# Patient Record
Sex: Female | Born: 1955
Health system: Southern US, Community
[De-identification: ages and names within clinical notes are randomized; demographics above are authoritative.]

## PROBLEM LIST (undated history)

## (undated) DIAGNOSIS — R590 Localized enlarged lymph nodes: Secondary | ICD-10-CM

## (undated) DIAGNOSIS — D693 Immune thrombocytopenic purpura: Secondary | ICD-10-CM

## (undated) DIAGNOSIS — R911 Solitary pulmonary nodule: Secondary | ICD-10-CM

## (undated) DIAGNOSIS — N189 Chronic kidney disease, unspecified: Secondary | ICD-10-CM

## (undated) DIAGNOSIS — G5600 Carpal tunnel syndrome, unspecified upper limb: Secondary | ICD-10-CM

## (undated) DIAGNOSIS — I10 Essential (primary) hypertension: Secondary | ICD-10-CM

## (undated) DIAGNOSIS — E039 Hypothyroidism, unspecified: Secondary | ICD-10-CM

## (undated) DIAGNOSIS — I219 Acute myocardial infarction, unspecified: Secondary | ICD-10-CM

## (undated) DIAGNOSIS — I639 Cerebral infarction, unspecified: Secondary | ICD-10-CM

## (undated) DIAGNOSIS — D47Z2 Castleman disease: Secondary | ICD-10-CM

## (undated) HISTORY — DX: Castleman disease: D47.Z2

## (undated) HISTORY — DX: Carpal tunnel syndrome, unspecified upper limb: G56.00

## (undated) HISTORY — DX: Acute myocardial infarction, unspecified: I21.9

## (undated) HISTORY — PX: APPENDECTOMY: SHX54

## (undated) HISTORY — DX: Solitary pulmonary nodule: R91.1

## (undated) HISTORY — DX: Cerebral infarction, unspecified: I63.9

## (undated) HISTORY — DX: Hypothyroidism, unspecified: E03.9

## (undated) HISTORY — DX: Chronic kidney disease, unspecified: N18.9

## (undated) HISTORY — DX: Localized enlarged lymph nodes: R59.0

## (undated) HISTORY — DX: Immune thrombocytopenic purpura: D69.3

---

## 1999-04-21 ENCOUNTER — Other Ambulatory Visit: Admission: RE | Admit: 1999-04-21 | Discharge: 1999-04-21 | Payer: Self-pay | Admitting: Obstetrics and Gynecology

## 2002-10-31 ENCOUNTER — Encounter: Payer: Self-pay | Admitting: Obstetrics and Gynecology

## 2002-10-31 ENCOUNTER — Encounter: Admission: RE | Admit: 2002-10-31 | Discharge: 2002-10-31 | Payer: Self-pay | Admitting: Obstetrics and Gynecology

## 2003-12-18 ENCOUNTER — Encounter: Admission: RE | Admit: 2003-12-18 | Discharge: 2003-12-18 | Payer: Self-pay | Admitting: Obstetrics and Gynecology

## 2004-11-15 ENCOUNTER — Inpatient Hospital Stay (HOSPITAL_COMMUNITY): Admission: AD | Admit: 2004-11-15 | Discharge: 2004-11-30 | Payer: Self-pay | Admitting: Internal Medicine

## 2004-11-16 ENCOUNTER — Encounter (INDEPENDENT_AMBULATORY_CARE_PROVIDER_SITE_OTHER): Payer: Self-pay | Admitting: *Deleted

## 2004-11-17 ENCOUNTER — Ambulatory Visit: Payer: Self-pay | Admitting: Internal Medicine

## 2004-11-17 ENCOUNTER — Encounter (INDEPENDENT_AMBULATORY_CARE_PROVIDER_SITE_OTHER): Payer: Self-pay | Admitting: *Deleted

## 2004-11-18 ENCOUNTER — Encounter (INDEPENDENT_AMBULATORY_CARE_PROVIDER_SITE_OTHER): Payer: Self-pay | Admitting: Cardiology

## 2004-11-19 ENCOUNTER — Encounter (INDEPENDENT_AMBULATORY_CARE_PROVIDER_SITE_OTHER): Payer: Self-pay | Admitting: *Deleted

## 2004-11-30 ENCOUNTER — Ambulatory Visit: Payer: Self-pay | Admitting: Internal Medicine

## 2005-01-26 ENCOUNTER — Ambulatory Visit: Payer: Self-pay | Admitting: Internal Medicine

## 2005-03-23 ENCOUNTER — Ambulatory Visit: Payer: Self-pay | Admitting: Internal Medicine

## 2005-06-21 ENCOUNTER — Ambulatory Visit: Payer: Self-pay | Admitting: Internal Medicine

## 2005-08-01 DIAGNOSIS — I219 Acute myocardial infarction, unspecified: Secondary | ICD-10-CM

## 2005-08-01 HISTORY — DX: Acute myocardial infarction, unspecified: I21.9

## 2005-09-13 ENCOUNTER — Inpatient Hospital Stay (HOSPITAL_COMMUNITY): Admission: EM | Admit: 2005-09-13 | Discharge: 2005-09-15 | Payer: Self-pay | Admitting: Emergency Medicine

## 2007-01-29 ENCOUNTER — Ambulatory Visit (HOSPITAL_COMMUNITY): Admission: RE | Admit: 2007-01-29 | Discharge: 2007-01-29 | Payer: Self-pay | Admitting: Cardiology

## 2010-08-01 DIAGNOSIS — R590 Localized enlarged lymph nodes: Secondary | ICD-10-CM

## 2010-08-01 HISTORY — DX: Localized enlarged lymph nodes: R59.0

## 2010-08-15 ENCOUNTER — Emergency Department (HOSPITAL_COMMUNITY)
Admission: EM | Admit: 2010-08-15 | Discharge: 2010-08-15 | Payer: Self-pay | Source: Home / Self Care | Admitting: Emergency Medicine

## 2010-08-16 LAB — DIFFERENTIAL
Basophils Absolute: 0.1 10*3/uL (ref 0.0–0.1)
Basophils Relative: 0 % (ref 0–1)
Eosinophils Absolute: 0.1 10*3/uL (ref 0.0–0.7)
Eosinophils Relative: 0 % (ref 0–5)
Lymphocytes Relative: 17 % (ref 12–46)
Lymphs Abs: 2.5 10*3/uL (ref 0.7–4.0)
Monocytes Absolute: 1 10*3/uL (ref 0.1–1.0)
Monocytes Relative: 7 % (ref 3–12)
Neutro Abs: 10.9 10*3/uL — ABNORMAL HIGH (ref 1.7–7.7)
Neutrophils Relative %: 76 % (ref 43–77)

## 2010-08-16 LAB — POCT I-STAT, CHEM 8
BUN: 9 mg/dL (ref 6–23)
Calcium, Ion: 1.12 mmol/L (ref 1.12–1.32)
Chloride: 105 mEq/L (ref 96–112)
Creatinine, Ser: 0.9 mg/dL (ref 0.4–1.2)
Glucose, Bld: 97 mg/dL (ref 70–99)
HCT: 51 % — ABNORMAL HIGH (ref 36.0–46.0)
Hemoglobin: 17.3 g/dL — ABNORMAL HIGH (ref 12.0–15.0)
Potassium: 3.9 mEq/L (ref 3.5–5.1)
Sodium: 140 mEq/L (ref 135–145)
TCO2: 25 mmol/L (ref 0–100)

## 2010-08-16 LAB — CBC
HCT: 46.1 % — ABNORMAL HIGH (ref 36.0–46.0)
Hemoglobin: 15.4 g/dL — ABNORMAL HIGH (ref 12.0–15.0)
MCH: 30.6 pg (ref 26.0–34.0)
MCHC: 33.4 g/dL (ref 30.0–36.0)
MCV: 91.7 fL (ref 78.0–100.0)
Platelets: 234 10*3/uL (ref 150–400)
RBC: 5.03 MIL/uL (ref 3.87–5.11)
RDW: 13.6 % (ref 11.5–15.5)
WBC: 14.4 10*3/uL — ABNORMAL HIGH (ref 4.0–10.5)

## 2010-08-22 ENCOUNTER — Encounter: Payer: Self-pay | Admitting: Emergency Medicine

## 2010-08-27 ENCOUNTER — Emergency Department (HOSPITAL_COMMUNITY)
Admission: EM | Admit: 2010-08-27 | Discharge: 2010-08-27 | Payer: Self-pay | Source: Home / Self Care | Admitting: Emergency Medicine

## 2010-08-27 LAB — COMPREHENSIVE METABOLIC PANEL
ALT: 17 U/L (ref 0–35)
AST: 21 U/L (ref 0–37)
Albumin: 4 g/dL (ref 3.5–5.2)
Alkaline Phosphatase: 76 U/L (ref 39–117)
BUN: 2 mg/dL — ABNORMAL LOW (ref 6–23)
CO2: 25 mEq/L (ref 19–32)
Calcium: 9.7 mg/dL (ref 8.4–10.5)
Chloride: 104 mEq/L (ref 96–112)
Creatinine, Ser: 0.81 mg/dL (ref 0.4–1.2)
GFR calc Af Amer: >60 mL/min (ref >60)
GFR calc non Af Amer: >60 mL/min (ref >60)
Glucose, Bld: 123 mg/dL — ABNORMAL HIGH (ref 70–99)
Potassium: 3.6 mEq/L (ref 3.5–5.1)
Sodium: 139 mEq/L (ref 135–145)
Total Bilirubin: 0.6 mg/dL (ref 0.3–1.2)
Total Protein: 6.6 g/dL (ref 6.0–8.3)

## 2010-08-27 LAB — CBC
HCT: 42.7 % (ref 36.0–46.0)
Hemoglobin: 14 g/dL (ref 12.0–15.0)
MCH: 29.8 pg (ref 26.0–34.0)
MCHC: 32.8 g/dL (ref 30.0–36.0)
MCV: 90.9 fL (ref 78.0–100.0)
Platelets: 191 10*3/uL (ref 150–400)
RBC: 4.7 MIL/uL (ref 3.87–5.11)
RDW: 13.4 % (ref 11.5–15.5)
WBC: 10.4 10*3/uL (ref 4.0–10.5)

## 2010-08-27 LAB — URINALYSIS, ROUTINE W REFLEX MICROSCOPIC
Bilirubin Urine: NEGATIVE
Hgb urine dipstick: NEGATIVE
Ketones, ur: NEGATIVE mg/dL
Nitrite: NEGATIVE
Protein, ur: NEGATIVE mg/dL
Specific Gravity, Urine: 1.008 (ref 1.005–1.030)
Urine Glucose, Fasting: NEGATIVE mg/dL
Urobilinogen, UA: 0.2 mg/dL (ref 0.0–1.0)
pH: 7.5 (ref 5.0–8.0)

## 2010-08-27 LAB — DIFFERENTIAL
Basophils Absolute: 0 10*3/uL (ref 0.0–0.1)
Basophils Relative: 0 % (ref 0–1)
Eosinophils Absolute: 0 10*3/uL (ref 0.0–0.7)
Eosinophils Relative: 0 % (ref 0–5)
Lymphocytes Relative: 17 % (ref 12–46)
Lymphs Abs: 1.7 10*3/uL (ref 0.7–4.0)
Monocytes Absolute: 0.7 10*3/uL (ref 0.1–1.0)
Monocytes Relative: 6 % (ref 3–12)
Neutro Abs: 8 10*3/uL — ABNORMAL HIGH (ref 1.7–7.7)
Neutrophils Relative %: 77 % (ref 43–77)

## 2010-08-27 LAB — LIPASE, BLOOD: Lipase: 16 U/L (ref 11–59)

## 2010-08-31 ENCOUNTER — Emergency Department (HOSPITAL_COMMUNITY)
Admission: EM | Admit: 2010-08-31 | Discharge: 2010-08-31 | Payer: Self-pay | Source: Home / Self Care | Admitting: Emergency Medicine

## 2010-08-31 LAB — OCCULT BLOOD, POC DEVICE: Fecal Occult Bld: POSITIVE

## 2010-09-01 ENCOUNTER — Emergency Department (HOSPITAL_COMMUNITY): Payer: BC Managed Care – PPO

## 2010-09-01 ENCOUNTER — Inpatient Hospital Stay (HOSPITAL_COMMUNITY)
Admission: EM | Admit: 2010-09-01 | Discharge: 2010-09-06 | DRG: 170 | Disposition: A | Discharge status: Home / Self Care | Payer: BC Managed Care – PPO | Attending: Family Medicine | Admitting: Family Medicine

## 2010-09-01 DIAGNOSIS — E860 Dehydration: Secondary | ICD-10-CM | POA: Diagnosis present

## 2010-09-01 DIAGNOSIS — I252 Old myocardial infarction: Secondary | ICD-10-CM

## 2010-09-01 DIAGNOSIS — Z9861 Coronary angioplasty status: Secondary | ICD-10-CM

## 2010-09-01 DIAGNOSIS — E876 Hypokalemia: Secondary | ICD-10-CM | POA: Diagnosis present

## 2010-09-01 DIAGNOSIS — E039 Hypothyroidism, unspecified: Secondary | ICD-10-CM | POA: Diagnosis present

## 2010-09-01 DIAGNOSIS — I10 Essential (primary) hypertension: Secondary | ICD-10-CM | POA: Diagnosis present

## 2010-09-01 DIAGNOSIS — E2749 Other adrenocortical insufficiency: Secondary | ICD-10-CM | POA: Diagnosis present

## 2010-09-01 DIAGNOSIS — K5900 Constipation, unspecified: Secondary | ICD-10-CM | POA: Diagnosis present

## 2010-09-01 DIAGNOSIS — I2510 Atherosclerotic heart disease of native coronary artery without angina pectoris: Secondary | ICD-10-CM | POA: Diagnosis present

## 2010-09-01 DIAGNOSIS — F17200 Nicotine dependence, unspecified, uncomplicated: Secondary | ICD-10-CM | POA: Diagnosis present

## 2010-09-01 DIAGNOSIS — R1031 Right lower quadrant pain: Principal | ICD-10-CM | POA: Diagnosis present

## 2010-09-01 DIAGNOSIS — R599 Enlarged lymph nodes, unspecified: Secondary | ICD-10-CM | POA: Diagnosis present

## 2010-09-01 LAB — COMPREHENSIVE METABOLIC PANEL
ALT: 18 U/L (ref 0–35)
AST: 15 U/L (ref 0–37)
Albumin: 4.1 g/dL (ref 3.5–5.2)
Alkaline Phosphatase: 87 U/L (ref 39–117)
BUN: 2 mg/dL — ABNORMAL LOW (ref 6–23)
Chloride: 101 mEq/L (ref 96–112)
Potassium: 3.6 mEq/L (ref 3.5–5.1)
Sodium: 140 mEq/L (ref 135–145)
Total Bilirubin: 0.5 mg/dL (ref 0.3–1.2)

## 2010-09-01 LAB — CBC
HCT: 41.2 % (ref 36.0–46.0)
Hemoglobin: 13.7 g/dL (ref 12.0–15.0)
RBC: 4.53 MIL/uL (ref 3.87–5.11)
RDW: 13.4 % (ref 11.5–15.5)
WBC: 7.5 10*3/uL (ref 4.0–10.5)

## 2010-09-01 LAB — DIFFERENTIAL
Basophils Absolute: 0 10*3/uL (ref 0.0–0.1)
Lymphocytes Relative: 25 % (ref 12–46)
Lymphs Abs: 1.9 10*3/uL (ref 0.7–4.0)
Neutro Abs: 4.8 10*3/uL (ref 1.7–7.7)
Neutrophils Relative %: 64 % (ref 43–77)

## 2010-09-01 LAB — URINALYSIS, ROUTINE W REFLEX MICROSCOPIC
Nitrite: NEGATIVE
Urine Glucose, Fasting: NEGATIVE mg/dL
pH: 6.5 (ref 5.0–8.0)

## 2010-09-01 LAB — D-DIMER, QUANTITATIVE: D-Dimer, Quant: 0.66 ug/mL-FEU — ABNORMAL HIGH (ref 0.00–0.48)

## 2010-09-01 LAB — POCT CARDIAC MARKERS

## 2010-09-01 LAB — CK TOTAL AND CKMB (NOT AT ARMC)
CK, MB: 0.6 ng/mL (ref 0.3–4.0)
Total CK: 19 U/L (ref 7–177)

## 2010-09-01 LAB — TROPONIN I: Troponin I: <0.01 ng/mL (ref 0.00–0.06)

## 2010-09-01 LAB — LIPASE, BLOOD: Lipase: 19 U/L (ref 11–59)

## 2010-09-01 MED ORDER — IOHEXOL 300 MG/ML  SOLN
100.0000 mL | Freq: Once | INTRAMUSCULAR | Status: AC | PRN
  Administered 2010-09-01: 70 mL via INTRAVENOUS
  Filled 2010-09-01: qty 100

## 2010-09-02 ENCOUNTER — Inpatient Hospital Stay (HOSPITAL_COMMUNITY): Payer: BC Managed Care – PPO

## 2010-09-02 LAB — GLUCOSE, CAPILLARY: Glucose-Capillary: 108 mg/dL — ABNORMAL HIGH (ref 70–99)

## 2010-09-02 LAB — CARDIAC PANEL(CRET KIN+CKTOT+MB+TROPI)
Relative Index: INVALID (ref 0.0–2.5)
Troponin I: 0.01 ng/mL (ref 0.00–0.06)

## 2010-09-02 LAB — CORTISOL: Cortisol, Plasma: 16.3 ug/dL

## 2010-09-02 LAB — HEMOGLOBIN A1C: Hgb A1c MFr Bld: 5.9 % — ABNORMAL HIGH (ref <5.7)

## 2010-09-02 LAB — PHOSPHORUS: Phosphorus: 4.4 mg/dL (ref 2.3–4.6)

## 2010-09-02 LAB — LACTIC ACID, PLASMA: Lactic Acid, Venous: 1 mmol/L (ref 0.5–2.2)

## 2010-09-02 LAB — PROTIME-INR: Prothrombin Time: 13.7 seconds (ref 11.6–15.2)

## 2010-09-02 LAB — LIPID PANEL
Cholesterol: 122 mg/dL (ref 0–200)
Total CHOL/HDL Ratio: 6.1 RATIO

## 2010-09-02 MED ORDER — GADOBENATE DIMEGLUMINE 529 MG/ML IV SOLN
0.1000 mmol/kg | Freq: Once | INTRAVENOUS | Status: AC | PRN
  Administered 2010-09-02: 14 mL via INTRAVENOUS

## 2010-09-03 ENCOUNTER — Inpatient Hospital Stay (HOSPITAL_COMMUNITY): Payer: BC Managed Care – PPO

## 2010-09-03 ENCOUNTER — Other Ambulatory Visit: Payer: Self-pay | Admitting: Interventional Radiology

## 2010-09-03 LAB — COMPREHENSIVE METABOLIC PANEL WITH GFR
ALT: 13 U/L (ref 0–35)
AST: 15 U/L (ref 0–37)
Albumin: 3.2 g/dL — ABNORMAL LOW (ref 3.5–5.2)
Alkaline Phosphatase: 75 U/L (ref 39–117)
BUN: <1 mg/dL — ABNORMAL LOW (ref 6–23)
CO2: 26 meq/L (ref 19–32)
Calcium: 8.5 mg/dL (ref 8.4–10.5)
Chloride: 106 meq/L (ref 96–112)
Creatinine, Ser: 0.65 mg/dL (ref 0.4–1.2)
GFR calc non Af Amer: >60 mL/min
Glucose, Bld: 96 mg/dL (ref 70–99)
Potassium: 3.4 meq/L — ABNORMAL LOW (ref 3.5–5.1)
Sodium: 141 meq/L (ref 135–145)
Total Bilirubin: 0.4 mg/dL (ref 0.3–1.2)
Total Protein: 6 g/dL (ref 6.0–8.3)

## 2010-09-03 LAB — ACTH: C206 ACTH: 34 pg/mL (ref 10–46)

## 2010-09-03 LAB — GLUCOSE, CAPILLARY: Glucose-Capillary: 98 mg/dL (ref 70–99)

## 2010-09-03 LAB — CBC
MCH: 29.9 pg (ref 26.0–34.0)
Platelets: 189 10*3/uL (ref 150–400)
RBC: 4.38 MIL/uL (ref 3.87–5.11)

## 2010-09-03 LAB — LACTATE DEHYDROGENASE: LDH: 141 U/L (ref 94–250)

## 2010-09-04 ENCOUNTER — Inpatient Hospital Stay (HOSPITAL_COMMUNITY): Payer: BC Managed Care – PPO

## 2010-09-04 DIAGNOSIS — R599 Enlarged lymph nodes, unspecified: Secondary | ICD-10-CM

## 2010-09-04 LAB — BASIC METABOLIC PANEL
BUN: 3 mg/dL — ABNORMAL LOW (ref 6–23)
Chloride: 105 mEq/L (ref 96–112)
Glucose, Bld: 96 mg/dL (ref 70–99)
Potassium: 4.2 mEq/L (ref 3.5–5.1)

## 2010-09-04 LAB — GLUCOSE, CAPILLARY: Glucose-Capillary: 101 mg/dL — ABNORMAL HIGH (ref 70–99)

## 2010-09-05 LAB — BASIC METABOLIC PANEL
CO2: 26 mEq/L (ref 19–32)
GFR calc Af Amer: >60 mL/min (ref >60)
GFR calc non Af Amer: >60 mL/min (ref >60)
Glucose, Bld: 93 mg/dL (ref 70–99)
Potassium: 3.7 mEq/L (ref 3.5–5.1)
Sodium: 140 mEq/L (ref 135–145)

## 2010-09-05 LAB — CBC
HCT: 39.3 % (ref 36.0–46.0)
Hemoglobin: 12.6 g/dL (ref 12.0–15.0)
MCHC: 32.1 g/dL (ref 30.0–36.0)
RBC: 4.3 MIL/uL (ref 3.87–5.11)

## 2010-09-05 LAB — GLUCOSE, CAPILLARY

## 2010-09-06 LAB — CRYPTOCOCCAL ANTIGEN

## 2010-09-06 LAB — HIGH SENSITIVITY CRP: CRP, High Sensitivity: 12 mg/L — ABNORMAL HIGH

## 2010-09-06 LAB — T3, FREE: T3, Free: 2.4 pg/mL (ref 2.3–4.2)

## 2010-09-06 LAB — ANTI-NUCLEAR AB-TITER (ANA TITER): ANA Titer 1: 1:80 {titer} — ABNORMAL HIGH

## 2010-09-06 LAB — ANA: Anti Nuclear Antibody(ANA): POSITIVE — AB

## 2010-09-07 LAB — PROTEIN ELECTROPHORESIS, SERUM
Alpha-1-Globulin: 6 % — ABNORMAL HIGH (ref 2.9–4.9)
Alpha-2-Globulin: 12.1 % — ABNORMAL HIGH (ref 7.1–11.8)
Beta 2: 4.3 % (ref 3.2–6.5)
Gamma Globulin: 18.1 % (ref 11.1–18.8)
M-Spike, %: NOT DETECTED g/dL

## 2010-09-07 LAB — UIFE/LIGHT CHAINS/TP QN, 24-HR UR
Albumin, U: DETECTED
Beta, Urine: DETECTED — AB
Free Lambda Excretion/Day: 3.26 mg/d
Free Lambda Lt Chains,Ur: 0.29 mg/dL (ref 0.08–1.01)
Free Lt Chn Excr Rate: 59.4 mg/d
Time: 24 hours
Total Protein, Urine-Ur/day: 69 mg/d (ref 10–140)
Total Protein, Urine: 6.1 mg/dL
Volume, Urine: 1125 mL

## 2010-09-07 LAB — TOXOPLASMA ANTIBODIES- IGG AND  IGM
Toxoplasma Antibody- IgM: 0.3 IV
Toxoplasma IgG Ratio: 0.7 IU/mL

## 2010-09-07 LAB — EPSTEIN-BARR VIRUS VCA ANTIBODY PANEL
EBV NA IgG: 2.93 {ISR} — ABNORMAL HIGH
EBV VCA IgM: 0.12 {ISR}

## 2010-09-07 LAB — ANGIOTENSIN CONVERTING ENZYME: Angiotensin-Converting Enzyme: 35 U/L (ref 8–52)

## 2010-09-08 LAB — CULTURE, BLOOD (ROUTINE X 2)
Culture  Setup Time: 201202021006
Culture: NO GROWTH

## 2010-09-08 LAB — BARTONELLA ANTIBODY PANEL
B henselae IgG: NEGATIVE
B henselae IgM: NEGATIVE

## 2010-09-08 LAB — HIV-1 RNA ULTRAQUANT REFLEX TO GENTYP+: HIV 1 RNA Quant: <20 copies/mL (ref <20)

## 2010-09-09 LAB — QUANTIFERON TB GOLD ASSAY (BLOOD): Mitogen Minus Nil Value: 9.45 IU/mL

## 2010-09-10 LAB — HISTOPLASMA ANTIGEN, URINE

## 2010-09-12 NOTE — H&P (Signed)
[name], [name]               ACCOUNT NO.:  [id number]  MEDICAL RECORD NO.:  [id number]           PATIENT TYPE:  E  LOCATION:  [facility]                         FACILITY:  [facility]  PHYSICIAN:  Rook Maue I Parish Dubose, MD      DATE OF BIRTH:  05/02/1956  DATE OF ADMISSION:  09/01/2010 DATE OF DISCHARGE:                             HISTORY & PHYSICAL   PRIMARY CARE PHYSICIAN/CARDIOLOGIST:  Eduardo Osier. Sharyn Lull, M.D.  ENDOCRINOLOGIST:  The patient recently has seen Dr. Talmage Nap.  CHIEF COMPLAINT:  Right upper abdominal pain for 1 week and constipation for 2 weeks.  HISTORY OF PRESENT ILLNESS:  This is a 55 year old Caucasian female with a history of hypothyroidism on replacement therapy, history of coronary artery disease status post right CA stent in 2007.  The patient's symptom started from the beginning of this year with some sinuses symptoms where the patient then received some antibiotic mainly Cefdinir and erythromycin where this complicated by diarrhea, which stopped about 2 weeks ago.  The patient then start to develop constipation.  She did not have any bowel movement for the last 2 weeks.  She has to come to the emergency room with abdominal pain last Friday, pain was 10/10, where the patient had CT of abdomen and pelvis, which did show abnormal appearance of her left adrenal gland with enlargement of the inferior aspect of the lymphatic gland and stranding in the renal fat and this raises  possibility of adrenal hemorrhage.  Appointment was made to see Dr. Talmage Nap, the endocrinologist.  Since Friday, the patient continued to have constant right hypochondrium pain associated with nausea, loss of appetite, but have 1 episode of vomiting.  The patient admitted.  The patient denies any hematemesis or bilious vomitus, but she has generally loss of appetite.  Pain was 10/10, sharp in nature.  Mild relief with current pain medication.  Accordingly, the patient has an ultrasound of her abdomen,  which did not show evidence of pericholecystic fluid or ascites, but noticed to have elevated D-dimer.  Accordingly, the patient had an CT of angio, which did not show evidence of pulmonary embolism, but this show some low neck, subpectoral and right axillary upper abdominal adenopathy, which worrisome for lymphoproliferative disorder. Also, this showed some bilateral adrenal haziness, new on the right from the CT scan done in August 27, 2010.  Adrenal hemorrhage is considered. The patient today seen by Dr. Talmage Nap for evaluation of her abdominal pain, which was thought may be related to her adrenal hemorrhage.  Dr. Talmage Nap did draw some blood test from the patient and asked the patient to come to the emergency room if pain recur.  Per the patient, the patient continued to have right hypochondrium, pain 10/10 associated with nausea and loss of appetite.  The patient denies any fever.  Condition also associated with constipation for the last 2 weeks.  PAST MEDICAL HISTORY: 1. Significant for hypothyroidism. 2. ITP. 3. MI status post RCA stent. 4. History of hypertension. 5. History of bilateral effusion and anasarca in 2007.  ALLERGIES:  No known drug allergies.  PAST SURGICAL HISTORY: 1. Significant for appendectomy. 2.  Two C-sections. 3. Status post RCA stent.  SOCIAL HISTORY:  She is married, have 2 grown children.  The patient admits smoking cigarette 1 pack per day for more than 20 years.  The patient denies any heavy drinking, but she drinks occasionally.  Denies any illicit drug abuse.  SYSTEMIC REVIEW:  The patient complain of some left side headache, but the patient denies any blurring of vision, complain of vertigo in the last 1 week, which is resolved currently.  Denies any nasal dripping. Denies any sore throat.  The patient admits she has loss of appetite. Denies any numbness or weakness of her extremities.  Respiratory:  The patient denies any coughing or fever.   Denies any palpitation, but sometimes she says shortness of breath.  Cardiovascular:  Denies any chest pain.  Denies any palpitation.  Denies any lower extremity swelling.  Abdomen:  The patient still with right hypochondrial pain 10/10, nonradiating.  Denies any hematemesis, complain of nausea, but 1 episode of vomiting.  Denies any significant loss of weight.  Denies any change in her menstrual cycle.  Denies any back pain.  Denies any lower extremity swelling.  Denies any burning micturition.  PHYSICAL EXAMINATION:  VITAL SIGNS:  On examination, the patient's vital signs, temperature 97.3, blood pressure 149/85, pulse rate 52, respiratory rate 22, saturating 98% on room air. HEENT:  The patient not in respiratory distress or shortness of breath. Pupils equal and reactive to light and accommodation.  Extraocular muscle movement was normal.  Mouth seems dry.  No tonsillar hypertrophy. There is a small lymph node and there is right axillary lymph node about 1 x 2 cm.  There is no discharge and nontender. BREAST EXAMINATION:  Symmetric.  I did not see any masses or nipple inverted.  I did not see any nipple discharge.  No masses. HEART:  S1 and S2 without added sounds. LUNG EXAMINATION:  Normal vesicular breathing with equal air entry. ABDOMEN:  Soft and there is some tenderness at the right hypochondrium. Murphy sign negative.  No rebound.  No guarding.  Bowel sound is positive. EXTREMITIES:  Lower extremity without edema.  Peripheral pulses intact. There is no area of point tenderness, although there could be a costophrenic angle tenderness.  LABORATORY DATA:  On blood workup, CBC, white blood cell 7.5, hemoglobin is 13.7, hematocrit 41.2, platelet 192.  Sodium 140, potassium 3.6, chloride 101, CO2 of 25, glucose 122, BUN 2, and creatinine 0.87, total bilirubin 0.5, alkaline phosphatase 87, AST 15, ALT 18, total protein 7.5, albumin 4.1, calcium 9.4, D-dimer was 0.66.  Cardiac  markers x2 was negative.  Lipase was negative.  The patient have some occult blood, which was positive on January.  The patient denies any colonoscopy in the past.  She had a CT of head on January 2012, which was negative.  CT of abdomen and pelvis on August 27, 2010, this show abnormal appearance of left adrenal gland with enlargement of the inferior aspect of the liver, adrenal gland has stranding in the renal fat.  Differential include atypical appearance of adrenal hemorrhage.  __________is possible but given the lack of adjacent atherosclerotic vascular disease, this is felt to be less likely increasing retroperitoneal lymphadenopathy as detailed above.  Chest x-ray, no acute finding. Ultrasound of the abdomen and pelvis, no evidence of stone or pericholecystic fluid.  CT angiogram negative for pulmonary embolism, low neck, and subpectoral, right axillary and upper abdominal adenopathy are highly suspicious for lymphoproliferative disorder, bilateral adrenal haziness on the  right from August 27, 2010, adrenal hemorrhage is considered.  ASSESSMENT AND PLAN: 1. Abdominal pain, which carry differential diagnosis, which include     constipation, also there is some bilateral adrenal haziness new on     the right from previous CT scan.  Differential bilateral     adrenal hemorrhage.  Could be related to just lymphoproliferative     disorder.  Our plan, we will admit the patient to Telemetry.  The     patient's EKG does show some minimal ST depression, but the patient     denies any chest pain or shortness of breath.  We will admit the     patient to Tele and we will proceed with MRI of the abdomen and     pelvis looking more closer to those adrenal gland.  We will get     random cortisol level and ACTH level, lactic acid level. 2. We will discuss the case with Dr. Talmage Nap.  Also, we will ask IR to     evaluate if there is any possibility of biopsy of the current lymph     node.  We will  get SPEP and UPEP.  We will get also Epstein-Barr     virus PCR.  The patient will be treated supportively with pain     medications and IV fluid.  Also will start the patient on clear     liquid diet as tolerated. 3. Hypokalemia.  We will replace with potassium supplement. 4. Dehydration.  IV fluid. 5. Bilateral haziness of both adrenal glands suspicious for     hemorrhage.  We will get random cortisol level and MRI of the     adrenal gland. 6. Hypothyroidism.  We will continue with the patient's medication     Synthroid.  The patient has already had a CT head of her brain,     which was negative, we will hold on any imaging.  Regarding the     patient's headache, we may need to proceed with MRI of the brain     and further recommendation to be addressed as the course progress.     Querida Beretta Bosie Helper, MD     HIE/MEDQ  D:  09/01/2010  T:  09/01/2010  Job:  [id number]  Electronically Signed by Ebony Cargo MD on 09/10/2010 06:55:37 PM

## 2010-09-13 NOTE — Discharge Summary (Signed)
NAMEJAVIANA, [name]               ACCOUNT NO.:  [id number]  MEDICAL RECORD NO.:  [id number]           PATIENT TYPE:  I  LOCATION:  4736                         FACILITY:  [facility]  PHYSICIAN:  Mauro Kaufmann, MD         DATE OF BIRTH:  1955/11/21  DATE OF ADMISSION:  09/01/2010 DATE OF DISCHARGE:  09/06/2010                              DISCHARGE SUMMARY   ADMISSION DIAGNOSES: 1. Abdominal pain. 2. Lymphadenopathy. 3. Hypokalemia. 4. Dehydration. 5. Bilateral haziness of the adrenal gland, suspicious for hemorrhage. 6. Hypothyroidism.  Discharge diagnoses include: 1. Lymphadenopathy, biopsy results are pending at this time. 2. Bilateral haziness of adrenal glands, questionable hemorrhage     versus stranding of the adrenal gland with normal     adrenocorticotropic hormone and cortisol. 3. Hypothyroidism. 4. Constipation. 5. History of coronary artery disease. 6. Right lower quadrant pain, resolved.  Tests performed during the hospital stay include: 1. Chest x-ray on September 01, 2010, showed no acute findings. 2. Abdominal ultrasound on September 01, 2010, showed choledochus     without evidence of discrete stones or pericholecystic fluid. 3. CT angio of the chest showed no pulmonary embolus, low neck     subpectoral, right axillary, and upper abdominal adenopathy.     Finding suspicious for lymphoproliferative disorder, bilateral     adrenal haziness, questionable adrenal hemorrhage. 4. MRI of the abdomen showed considerable stranding surrounding the     adrenal gland, query adrenal hemorrhage or adrenal insufficiency.     Adrenal glands appear to enhance and thus overt infarction of the     adrenal gland is not currently suspected from an imaging     standpoint.  Retroperitoneal adenopathy could be in  setting of     malignancy such as lymphoma. 5. Ultrasound-guided biopsy of the right subpectoral lymph node.  The     biopsy result is pending at this time. 6. Abdominal x-ray.   Nonobstructive bowel gas pattern.  PERTINENT LABORATORY DATA:  D-dimer was 0.66.  Cardiac enzymes negative. Lactic acid level 1.0.  TSH 5.20.  Cortisol level 16.3.  ACTH 34.  LDH 141.  HIV antibodies negative.  Sed rate is 5.  C-reactive protein  is 12.0.  Epstein-Barr virus EBV DNA by PCR less than 200.  UPEP shows free kappa excretion of 59.40 with free lambda excretion of 3.26 with a kappa:lambda ratio of 18.21.  ANA is positive.  Cryptococcal antigen is negative.  Consults obtained in hospital stay include Infectious Disease consult.  BRIEF HISTORY AND PHYSICAL:  A 55 year old Caucasian female with a history of hypothyroidism on replacement therapy, history of CAD status post right CEA stent in 2007.  The patient's symptoms started from the beginning of this year with some sinuses symptoms.  The patient then received some antibiotic, mainly Ceftin and erythromycin and this was complicated by diarrhea which stopped about 2 weeks ago.  The patient then started to develop constipation.  She did not have bowel movement for at least 2 weeks and she came to the ER with abdominal pain last Friday, pain was 10/10, where the patient had CT abdomen  and pelvis which did not show abnormal appearance of her left adrenal gland with enlargement of the inferior aspect of the lymphatic gland and stranding in the renal fat and this is a possibility of adrenal hemorrhage.  The patient saw Dr. Talmage Nap, endocrinologist and she continued to have right upper quadrant pain with one episode of vomiting.  Ultrasound of the abdomen did not show evidence of pericholecystic fluid ascites, but she was found to have elevated D-dimer.  CT angio of the neck showed no evidence of pulmonary embolism, but showed some low neck subpectoral and right axillary, upper abdominal adenopathy.  Worrisome for the lymphoproliferative disorder.  The patient was admitted with diagnoses of abdominal pain, lymphadenopathy, and a  lymph node biopsy was ordered.  BRIEF HOSPITAL COURSE: 1. Lymphadenopathy.  The patient had a lymph node biopsy done on     September 03, 2010, the results of the biopsy are still pending at     this time.  The patient wants to go home at this time.  Clinically,     the patient is stable.  Also, the patient's UPEP shows high free     kappa:lambda ratio.  This is suspicion for underlying malignancy.     SPEP is pending at this time.  The patient will follow up with     Oncology.  I discussed with Dr. Arbutus Ped and he said the patient can     follow up as outpatient with them for further workup. 2. Abdominal pain.  The patient was given OxyContin and the patient's     abdominal pain resolved.  Also, the patient has been able to eat     regular food in the hospital without any more episodes of nausea     and vomiting.  The patient is clinically stable at this time. 3. Questionable adrenal hemorrhage.  The patient had normal ACTH and     cortisol level in the hospital.  ACTH was 34 with cortisol of 16.3     and at this time, the adrenal involvement could be because of     underlying malignancy.  We will wait for the biopsy results of the  lymph node as well as further Oncology evaluation. 4. Constipation.  The patient was given lactulose during her hospital     which resolved the constipation. 5. ID workup.  The patient was seen by Infectious Disease for the     lymphadenopathy.  In the meantime, also an Infectious Disease     workup has been sent as the patient wants to go home.  The labs are     pending at this time and the patient will follow up with Dr. Daiva Eves for further followup on these labs.  At this time, the patient     is stable for discharge.  The medications on discharge include: 1. MiraLax 17 g p.o. daily. 2. Aspirin 81 mg p.o. daily. 3. Levothyroxine 112 mcg p.o. daily. 4. Metoprolol 25 mg half tablet p.o. daily.  Called Cancer center and made an appointment for the  patient with oncology,  They would call the patient to make an appointment.     Mauro Kaufmann, MD     GL/MEDQ  D:  09/06/2010  T:  09/07/2010  Job:  [id number]  cc:   Dorisann Frames, M.D. Lajuana Matte, MD Acey Lav, MD Eduardo Osier. Sharyn Lull, M.D.  Electronically Signed by Mauro Kaufmann  on 09/13/2010 10:47:45 AM

## 2010-09-13 NOTE — Discharge Summary (Signed)
  [name], [name]               ACCOUNT NO.:  [id number]  MEDICAL RECORD NO.:  [id number]          PATIENT TYPE:  LOCATION:                                 FACILITY:  PHYSICIAN:  Mauro Kaufmann, MD              DATE OF BIRTH:  DATE OF ADMISSION: DATE OF DISCHARGE:                              DISCHARGE SUMMARY   VITALS ON THE DAY OF DISCHARGE:  Temperature is 98.2, pulse 70, respirations 20, blood pressure 110/72, O2 sats 97% on room air.  LABORATORY DATA:  WBC 6.7, hemoglobin 12.6, hematocrit 39.3, platelet count of 185.  The patient's CRP was 12.0.     Mauro Kaufmann, MD     GL/MEDQ  D:  09/06/2010  T:  09/07/2010  Job:  [id number]  Electronically Signed by Mauro Kaufmann  on 09/13/2010 10:48:01 AM

## 2010-09-13 NOTE — Discharge Summary (Signed)
  [name], [name]               ACCOUNT NO.:  [id number]  MEDICAL RECORD NO.:  [id number]           PATIENT TYPE:  I  LOCATION:  4736                         FACILITY:  [facility]  PHYSICIAN:  Mauro Kaufmann, MD         DATE OF BIRTH:  1955/12/22  DATE OF ADMISSION:  09/01/2010 DATE OF DISCHARGE:  09/06/2010                              DISCHARGE SUMMARY   ADMISSION DIAGNOSES: 1. Abdominal pain.   See Full dictation   DICTATION ENDED AT THIS POINT.     Mauro Kaufmann, MD     GL/MEDQ  D:  09/06/2010  T:  09/07/2010  Job:  [id number]  Electronically Signed by Mauro Kaufmann  on 09/13/2010 10:45:40 AM

## 2010-09-15 ENCOUNTER — Emergency Department (HOSPITAL_COMMUNITY)
Admission: EM | Admit: 2010-09-15 | Discharge: 2010-09-15 | Disposition: A | Discharge status: Home / Self Care | Payer: BC Managed Care – PPO | Attending: Emergency Medicine | Admitting: Emergency Medicine

## 2010-09-15 ENCOUNTER — Emergency Department (HOSPITAL_COMMUNITY): Payer: BC Managed Care – PPO

## 2010-09-15 ENCOUNTER — Emergency Department (HOSPITAL_COMMUNITY): Payer: Self-pay

## 2010-09-15 DIAGNOSIS — E039 Hypothyroidism, unspecified: Secondary | ICD-10-CM | POA: Insufficient documentation

## 2010-09-15 DIAGNOSIS — H538 Other visual disturbances: Secondary | ICD-10-CM | POA: Insufficient documentation

## 2010-09-15 DIAGNOSIS — I252 Old myocardial infarction: Secondary | ICD-10-CM | POA: Insufficient documentation

## 2010-09-15 DIAGNOSIS — R591 Generalized enlarged lymph nodes: Secondary | ICD-10-CM

## 2010-09-15 DIAGNOSIS — Z7982 Long term (current) use of aspirin: Secondary | ICD-10-CM | POA: Insufficient documentation

## 2010-09-15 DIAGNOSIS — R279 Unspecified lack of coordination: Secondary | ICD-10-CM | POA: Insufficient documentation

## 2010-09-15 DIAGNOSIS — N19 Unspecified kidney failure: Secondary | ICD-10-CM | POA: Insufficient documentation

## 2010-09-15 DIAGNOSIS — Z79899 Other long term (current) drug therapy: Secondary | ICD-10-CM | POA: Insufficient documentation

## 2010-09-15 DIAGNOSIS — H539 Unspecified visual disturbance: Secondary | ICD-10-CM | POA: Insufficient documentation

## 2010-09-15 LAB — BASIC METABOLIC PANEL
BUN: 4 mg/dL — ABNORMAL LOW (ref 6–23)
CO2: 27 mEq/L (ref 19–32)
Calcium: 9.2 mg/dL (ref 8.4–10.5)
Creatinine, Ser: 0.71 mg/dL (ref 0.4–1.2)
Glucose, Bld: 95 mg/dL (ref 70–99)

## 2010-09-15 LAB — DIFFERENTIAL
Basophils Absolute: 0.1 10*3/uL (ref 0.0–0.1)
Lymphocytes Relative: 18 % (ref 12–46)
Lymphs Abs: 2.1 10*3/uL (ref 0.7–4.0)
Neutro Abs: 8.6 10*3/uL — ABNORMAL HIGH (ref 1.7–7.7)
Neutrophils Relative %: 74 % (ref 43–77)

## 2010-09-15 LAB — CBC
HCT: 44 % (ref 36.0–46.0)
Hemoglobin: 14.8 g/dL (ref 12.0–15.0)
RBC: 4.86 MIL/uL (ref 3.87–5.11)
RDW: 13.7 % (ref 11.5–15.5)
WBC: 11.7 10*3/uL — ABNORMAL HIGH (ref 4.0–10.5)

## 2010-09-15 MED ORDER — GADOBENATE DIMEGLUMINE 529 MG/ML IV SOLN
15.0000 mL | Freq: Once | INTRAVENOUS | Status: AC
  Administered 2010-09-15: 15 mL via INTRAVENOUS

## 2010-09-16 ENCOUNTER — Encounter: Payer: BC Managed Care – PPO | Admitting: Internal Medicine

## 2010-09-16 ENCOUNTER — Encounter: Payer: Self-pay | Admitting: Internal Medicine

## 2010-09-16 ENCOUNTER — Encounter (HOSPITAL_BASED_OUTPATIENT_CLINIC_OR_DEPARTMENT_OTHER): Payer: BC Managed Care – PPO | Admitting: Internal Medicine

## 2010-09-16 DIAGNOSIS — D693 Immune thrombocytopenic purpura: Secondary | ICD-10-CM

## 2010-09-22 ENCOUNTER — Emergency Department (HOSPITAL_COMMUNITY): Payer: BC Managed Care – PPO

## 2010-09-22 ENCOUNTER — Observation Stay (HOSPITAL_COMMUNITY)
Admission: EM | Admit: 2010-09-22 | Discharge: 2010-09-24 | DRG: 398 | Disposition: A | Discharge status: Home / Self Care | Payer: BC Managed Care – PPO | Attending: Internal Medicine | Admitting: Internal Medicine

## 2010-09-22 DIAGNOSIS — Z9861 Coronary angioplasty status: Secondary | ICD-10-CM | POA: Insufficient documentation

## 2010-09-22 DIAGNOSIS — R599 Enlarged lymph nodes, unspecified: Secondary | ICD-10-CM | POA: Insufficient documentation

## 2010-09-22 DIAGNOSIS — K2970 Gastritis, unspecified, without bleeding: Secondary | ICD-10-CM | POA: Insufficient documentation

## 2010-09-22 DIAGNOSIS — F17200 Nicotine dependence, unspecified, uncomplicated: Secondary | ICD-10-CM | POA: Insufficient documentation

## 2010-09-22 DIAGNOSIS — R1012 Left upper quadrant pain: Principal | ICD-10-CM | POA: Insufficient documentation

## 2010-09-22 DIAGNOSIS — E039 Hypothyroidism, unspecified: Secondary | ICD-10-CM | POA: Insufficient documentation

## 2010-09-22 DIAGNOSIS — I2510 Atherosclerotic heart disease of native coronary artery without angina pectoris: Secondary | ICD-10-CM | POA: Insufficient documentation

## 2010-09-22 DIAGNOSIS — E860 Dehydration: Secondary | ICD-10-CM | POA: Insufficient documentation

## 2010-09-22 LAB — DIFFERENTIAL
Basophils Absolute: 0 10*3/uL (ref 0.0–0.1)
Basophils Relative: 0 % (ref 0–1)
Eosinophils Relative: 0 % (ref 0–5)
Monocytes Absolute: 0.8 10*3/uL (ref 0.1–1.0)

## 2010-09-22 LAB — URINALYSIS, ROUTINE W REFLEX MICROSCOPIC
Bilirubin Urine: NEGATIVE
Hgb urine dipstick: NEGATIVE
Ketones, ur: NEGATIVE mg/dL
Nitrite: NEGATIVE
Protein, ur: NEGATIVE mg/dL
Specific Gravity, Urine: 1.035 — ABNORMAL HIGH (ref 1.005–1.030)
Urobilinogen, UA: 0.2 mg/dL (ref 0.0–1.0)

## 2010-09-22 LAB — COMPREHENSIVE METABOLIC PANEL
Alkaline Phosphatase: 100 U/L (ref 39–117)
BUN: 5 mg/dL — ABNORMAL LOW (ref 6–23)
CO2: 24 mEq/L (ref 19–32)
GFR calc non Af Amer: >60 mL/min (ref >60)
Glucose, Bld: 155 mg/dL — ABNORMAL HIGH (ref 70–99)
Potassium: 3.5 mEq/L (ref 3.5–5.1)
Total Protein: 7.3 g/dL (ref 6.0–8.3)

## 2010-09-22 LAB — CBC
MCH: 30.4 pg (ref 26.0–34.0)
MCHC: 33.6 g/dL (ref 30.0–36.0)
RDW: 13.7 % (ref 11.5–15.5)

## 2010-09-22 LAB — TROPONIN I: Troponin I: 0.01 ng/mL (ref 0.00–0.06)

## 2010-09-22 LAB — LIPASE, BLOOD: Lipase: 25 U/L (ref 11–59)

## 2010-09-22 LAB — CK TOTAL AND CKMB (NOT AT ARMC)
Relative Index: INVALID (ref 0.0–2.5)
Total CK: 24 U/L (ref 7–177)

## 2010-09-22 MED ORDER — IOHEXOL 300 MG/ML  SOLN
80.0000 mL | Freq: Once | INTRAMUSCULAR | Status: AC | PRN
  Administered 2010-09-22: 80 mL via INTRAVENOUS

## 2010-09-22 NOTE — H&P (Signed)
[name], [name]               ACCOUNT NO.:  [id number]  MEDICAL RECORD NO.:  [id number]           PATIENT TYPE:  E  LOCATION:  [facility]                         FACILITY:  [facility]  PHYSICIAN:  Talmage Nap, MD  DATE OF BIRTH:  [birth year]  DATE OF ADMISSION:  09/22/2010 DATE OF DISCHARGE:                             HISTORY & PHYSICAL   PRIMARY CARE PHYSICIAN/CARDIOLOGIST:  Eduardo Osier. Sharyn Lull, MD.  ENDOCRINOLOGIST:  Dorisann Frames, MD.  HEMATOLOGIST/ONCOLOGIST:  Lajuana Matte, MD.  CHIEF COMPLAINT:  Left upper quadrant pain which started at about 7:30 p.m. on September 21, 2010.  The patient is a 55 year old Caucasian female with history of hypothyroidism, coronary artery disease status post cardiac cath with stent, who was initially admitted to the hospital on September 01, 2010, with complaints of right upper abdominal pain and subsequently discharged on September 09, 2010.  In that admission, the patient was fully evaluated for lymphoproliferative disease, i.e. she had a biopsy done and subsequently was followed up by the heme/oncologist, Dr. Arbutus Ped.  Pathology report showed benign lymph nodal tissue with lymphoid hyperplasia and she had thereafter been followed up by Dr. Arbutus Ped.  She was, however, seen by me today which is September 22, 2010, with 1-day history of her left upper quadrant pain which started very slowly and progressively grew worse.  Initially, pain was said to be about 4/10 in intensity and was confined to the left upper quadrant and slightly extending to the left flank.  Thereafter, pain became unbearable, was about 6/10 in intensity.  She claims she was nauseated, but denied any vomiting.  She denied any systemic symptoms, i.e. no fever, no chills, no rigor.  She denied any dysuria or hematuria.  No diarrhea or hematochezia.  She also denied any chest pain or shortness of breath.  Pain was said to have been unbearable and subsequently, the patient  presented to the emergency room to be evaluated.  PAST MEDICAL HISTORY:  Positive for: 1. Coronary artery disease. 2. Hypothyroidism. 3. ITP. 4. Retroperitoneal as well as pelvic lymphadenopathy.  PAST SURGICAL HISTORY: 1. Coronary artery disease status post cardiac cath plus stent. 2. Cesarean section. 3. Most recently is lymph node biopsy of the right axilla and     pathology report showed benign lymph nodal tissue with lymphoid     hyperplasia.  Her preadmission meds include without dosages: 1. Aspirin. 2. Levothyroxine. 3. Metoprolol.  She has no known allergies.  SOCIAL HISTORY:  The patient smokes about a half a pack of cigarettes for over 35 years, periodically takes alcohol, and she is currently on disability.  FAMILY HISTORY:  York Spaniel to be positive for coronary artery disease.  REVIEW OF SYSTEMS:  The patient denies any history of headaches.  No blurred vision.  Complained about dryness in the mouth.  Nausea, but no vomiting.  No chest pain, no shortness of breath.  She also denies any systemic symptoms, i.e. no fever, no chills, no rigor, no cough. Complained about mild left upper quadrant pain that is nonradiating.  No associated systemic symptoms.  No diarrhea or hematochezia.  No dysuria or hematuria.  No  swelling of the lower extremities.  No intolerance to heat or cold and no neuropsychiatric disorder.  PHYSICAL EXAMINATION:  GENERAL:  Middle-aged lady, dehydrated, not in any obvious respiratory distress. PRESENT VITAL SIGNS:  Blood pressure is 93/53, pulse is 76, respiratory rate is 17, temperature is 98.1. HEENT:  Mild pallor, but pupils are reactive to light and extraocular muscles are intact. NECK:  No jugular venous distention.  No carotid bruit.  No lymphadenopathy. CHEST:  Clear to auscultation. HEART:  Heart sounds are 1 and 2. ABDOMEN:  Soft with some vague tenderness in the left upper quadrant. No guarding, no rigidity.  Costovertebral tenderness  is negative. Liver, spleen, kidney all palpable.  Bowel sounds are positive. EXTREMITIES:  No pedal edema. NEUROLOGIC:  Nonfocal. MUSCULOSKELETAL SYSTEM:  Unremarkable. SKIN:  Decreased turgor.  LABORATORY DATA:  Initial complete blood count with differential showed WBC of 11.8, hemoglobin of 14.3, hematocrit of 42.5, MCV of 90.4 with a platelet count of 141, neutrophils 77%, and absolute neutrophil count is 9.1.  Cardiac markers; troponin I 0.01, unremarkable.  Comprehensive metabolic panel showed sodium of 138, potassium of 3.5, chloride of 102 with a bicarb of 24, glucose is 155, BUN is 5, creatinine 0.72.  LFTs are normal.  GFR is normal.  Lipase is 25.  Urinalysis unremarkable.  Imaging studies done on the patient include CT of the abdomen and pelvis with contrast and it showed a stable uterus, adnexa, distal colon, and bladder.  There is redundant sigmoid, distal colon diverticulosis. Stomach, duodenum, liver, gallbladder, spleen, pancreas, and right adrenal and portal systems are within normal limits.  There is, however, continued abnormal appearance of the left adrenal gland which was said to be enlarged, indistinct, and surrounded by inflammatory changes and there are large nodes which measure about 20 mm in short axis.  These nodes are the root of the mesentery.  No inguinal lymphadenopathy seen.  The impression is that of my progress of retroperitoneal and pelvic lymphadenopathy, maximal at the level of the left kidney, continued to favor lymphoproliferative disorder/lymphoma.  This is a sequela of the left adrenal hemorrhage noted in the last admission.  ADMITTING IMPRESSION: 1. Left upper quadrant pain, most likely secondary to lymphadenitis     (reactive). 2. Hypothyroidism. 3. Dehydration. 4. History of idiopathic thrombocytopenic purpura. 5. History of coronary artery disease status post stent.  PLAN:  To admit the patient to general medical floor.  The patient  will be adequately rehydrated with half normal saline IV to go at a rate of 80 mL an hour.  She will empirically be given Rocephin 1 g IV q.24 and pain control will be done with Dilaudid 2 mg IV q.4 p.r.n.  She will also be given Synthroid 112 mcg p.o. daily.  GI prophylaxis will be with Protonix 40 mg IV q.24 and DVT prophylaxis will be with TED stockings. Metoprolol will be on hold for now because of the low blood pressure.  Further workup to be done on this patient will include blood culture x2 before starting IV antibiotics.  CBC, CMP, and magnesium will be repeated in a.m.  Finally, I had an extensive discussion with the patient and postdischarge, the patient is to continue to follow up with Dr. Arbutus Ped.     Talmage Nap, MD     CN/MEDQ  D:  09/22/2010  T:  09/22/2010  Job:  [id number]  Electronically Signed by Talmage Nap  on 09/22/2010 07:27:36 PM

## 2010-09-23 LAB — CBC
MCH: 29.7 pg (ref 26.0–34.0)
MCHC: 33 g/dL (ref 30.0–36.0)
Platelets: 105 10*3/uL — ABNORMAL LOW (ref 150–400)
RDW: 13.9 % (ref 11.5–15.5)

## 2010-09-23 LAB — DIFFERENTIAL
Basophils Absolute: 0 10*3/uL (ref 0.0–0.1)
Basophils Relative: 0 % (ref 0–1)
Eosinophils Absolute: 0.1 10*3/uL (ref 0.0–0.7)
Eosinophils Relative: 1 % (ref 0–5)
Monocytes Absolute: 0.7 10*3/uL (ref 0.1–1.0)
Monocytes Relative: 7 % (ref 3–12)
Neutro Abs: 7.9 10*3/uL — ABNORMAL HIGH (ref 1.7–7.7)

## 2010-09-23 LAB — COMPREHENSIVE METABOLIC PANEL
ALT: 12 U/L (ref 0–35)
AST: 17 U/L (ref 0–37)
CO2: 26 mEq/L (ref 19–32)
Calcium: 8.8 mg/dL (ref 8.4–10.5)
Creatinine, Ser: 0.7 mg/dL (ref 0.4–1.2)
GFR calc Af Amer: >60 mL/min (ref >60)
GFR calc non Af Amer: >60 mL/min (ref >60)
Sodium: 137 mEq/L (ref 135–145)
Total Protein: 6.4 g/dL (ref 6.0–8.3)

## 2010-09-24 ENCOUNTER — Inpatient Hospital Stay (HOSPITAL_COMMUNITY): Payer: BC Managed Care – PPO

## 2010-09-24 LAB — COMPREHENSIVE METABOLIC PANEL
ALT: 10 U/L (ref 0–35)
Alkaline Phosphatase: 94 U/L (ref 39–117)
BUN: 2 mg/dL — ABNORMAL LOW (ref 6–23)
CO2: 30 mEq/L (ref 19–32)
GFR calc non Af Amer: >60 mL/min (ref >60)
Glucose, Bld: 88 mg/dL (ref 70–99)
Potassium: 3.8 mEq/L (ref 3.5–5.1)
Sodium: 139 mEq/L (ref 135–145)
Total Bilirubin: 0.4 mg/dL (ref 0.3–1.2)

## 2010-09-24 LAB — DIFFERENTIAL
Basophils Absolute: 0 10*3/uL (ref 0.0–0.1)
Eosinophils Relative: 2 % (ref 0–5)
Lymphocytes Relative: 18 % (ref 12–46)
Lymphs Abs: 1.4 10*3/uL (ref 0.7–4.0)
Monocytes Absolute: 0.6 10*3/uL (ref 0.1–1.0)
Neutro Abs: 5.8 10*3/uL (ref 1.7–7.7)

## 2010-09-24 LAB — CBC
HCT: 38.1 % (ref 36.0–46.0)
Hemoglobin: 12.7 g/dL (ref 12.0–15.0)
MCHC: 33.3 g/dL (ref 30.0–36.0)
MCV: 91.4 fL (ref 78.0–100.0)
WBC: 8 10*3/uL (ref 4.0–10.5)

## 2010-09-24 LAB — MAGNESIUM: Magnesium: 2.1 mg/dL (ref 1.5–2.5)

## 2010-09-25 NOTE — Discharge Summary (Signed)
[name], [name]               ACCOUNT NO.:  [id number]  MEDICAL RECORD NO.:  [id number]           PATIENT TYPE:  I  LOCATION:  5505                         FACILITY:  [facility]  PHYSICIAN:  Talmage Nap, MD  DATE OF BIRTH:  1955/09/18  DATE OF ADMISSION:  09/22/2010 DATE OF DISCHARGE:  09/24/2010                        DISCHARGE SUMMARY - REFERRING   PRIMARY CARDIOLOGIST:  Eduardo Osier. Sharyn Lull, MD.  ENDOCRINOLOGIST:  Dorisann Frames, MD.  HEMATOLOGIST/ONCOLOGIST:  Lajuana Matte, MD.  DISCHARGE DIAGNOSIS: 1. Left abdominal pain, questionable secondary to reactive     lymphadenitis. 2. Peritoneal lymphadenopathy. 3. Hypothyroidism. 4. Dehydration. 5. Gastritis. 6. History of idiopathic thrombocytopenic purpura. 7. History of coronary artery disease status post stent.  HISTORY OF PRESENT ILLNESS:  The patient is a 55 year old Caucasian female with history of hypothyroidism, coronary artery disease, status post stent was admitted by me on September 22, 2010 with complaint of left upper quadrant pain which was said to be getting progressively worse.  The pain was said to be associated with nausea, but denied any vomiting.  She denied any fever.  She denied any chills.  She denied any rigor.  She also denied any history of diarrhea or hematochezia and subsequently no chest pain or shortness of breath and subsequently presented to the Emergency Room to be evaluated.  PREADMISSION MEDICATIONS:  Include: 1. Aspirin. 2. Levothyroxine. 3. Metoprolol.  PAST SURGICAL HISTORY: 1. Coronary artery disease status post cardiac cath plus stent. 2. Cesarean section. 3. Right axillary lymph node biopsy and pathology showed benign lymph     nodal tissue with lymphoid hyperplasia.  ALLERGIES:  She had no known allergies.  SOCIAL HISTORY:  The patient smokes about a half a pack of cigarettes for over 35 years, periodically takes alcohol, and she is currently on disability.  FAMILY  HISTORY:  York Spaniel to be positive for coronary artery disease.  REVIEW OF SYSTEMS:  Essentially as documented in the initial history and physical.  At the time the patient was seen by me, she was dehydrated and any denies obvious respiratory distress.  OBJECTIVE:  VITAL SIGNS:  Blood pressure was 93/53, pulse 76, respiratory rate 17, temperature is 98.1. HEENT:  Pallor but pupils are reactive to light and extraocular muscles are intact. NECK:  No jugular venous distention.  No carotid bruit.  No lymphadenopathy. CHEST:  Clear to auscultation. HEART : Heart sounds are 1 and 2. ABDOMEN:  Soft with vague tenderness in the left upper quadrant.  No guarding, no rigidity.  Costovertebral tenderness is negative.  Liver, spleen, kidney all palpable and bowel sounds are positive. EXTREMITIES:  No pedal edema. NEUROLOGIC:  Nonfocal. MUSCULOSKELETAL:  Unremarkable. SKIN:  Decreased turgor.  LABORATORY DATA:  Initial complete blood count with differential showed WBC of 11.8, hemoglobin of 14.3, hematocrit of 42.5, MCV of 90.4 with a platelet count of 141.  Absolute neutrophil count is 9.1.  Cardiac marker troponin-I 0.01.  Urinalysis unremarkable.  Blood culture x2 negative.  Magnesium level is 2.1.  A repeat comprehensive metabolic panel done on September 24, 2010 showed sodium of 139, potassium of 3.8, chloride of 102 with a bicarb  of 30, glucose is 88, BUN is 2, creatinine is 0.76.  A complete blood count with differential showed WBC of 8.0, hemoglobin of 12.7, hematocrit of 38.4, MCV of 91.4 with a platelet count of 97.  Normal differentials.  Imaging studies done include CT of the abdomen and pelvis on admission, which showed mild progression of retroperitoneal lymphadenopathy, maximal at the level of the left kidney and this is said to have continued to favor lymphoproliferative disorder/lymphoma or probably sequela of the left adrenal hemorrhage.  HOSPITAL COURSE:  The patient was  admitted to General Medical Floor. She was started on normal saline IV to go at rate of 80 mL an hour, Rocephin 1 g IV q.24.  She was also given Dilaudid 2 mg IV q.4 p.r.n. for pain control.  The patient however was said to have reacted to Dilaudid with itching and this was controlled with Phenergan and subsequently, she was placed on Percocet for pain control.  She was also given Synthroid 112 mcg p.o. daily for hypothyroidism, and Protonix 40 mg IV q.24 for GI prophylaxis and TED stockings for DVT prophylaxis.  It was also given to the patient to control the itching, was Phenergan and also Benadryl.  The patient had about 1 to 2 episodes of vomiting and I subsequently had an extensive discussion with GI physician, Dr. Ewing Schlein for possible evaluation for an endoscopy and Dr. Ewing Schlein said the patient does not require any endoscopy.  The patient was however continued on pain management and this resolved.  BUN and WBC count was normal.  The patient was suppose to have a repeat CT of the abdomen and pelvis done today, which is September 24, 2010, but she declined.  So far, she has remained clinically stable.  She was reevaluated by me today and examination of the patient was essentially unremarkable.  Her vital signs; blood pressure is 102/67, temperature is 97.8, pulse 67, respiratory rate is 18, medically stable.  I had an extensive and multiple discussion with the patient about the need for her to followup with the heme/oncologist, Dr. Arbutus Ped and she verbalized understanding. The plan is for the patient to be discharged home.  ACTIVITY:  As tolerated.  DISCHARGE DIET:  Low-sodium, low-cholesterol diet.  DISCHARGE INSTRUCTIONS:  Follow up with her heme/oncologist in 1-2 weeks.  DISCHARGE MEDICATIONS:  Medication to be taken at home include: 1. Benadryl 25 mg one p.o. q.6 p.r.n. 2. Docusate sodium 100 mcg one p.o. b.i.d. 3. Zofran 4 mg one p.o. q.6 p.r.n. 4. Percocet 5/325 two tablets  p.o. q.4 p.r.n. 5. Pantoprazole 40 mg one p.o. daily. 6. Aspirin 81 mg one p.o. q.a.m. 7. Levothyroxine 112 mcg one p.o. daily. 8 . Metoprolol tartrate 25 mg half a tablet p.o. daily.     Talmage Nap, MD     CN/MEDQ  D:  09/24/2010  T:  09/24/2010  Job:  [id number]  cc:   Lajuana Matte, MD  Electronically Signed by Talmage Nap  on 09/25/2010 06:31:24 AM

## 2010-09-28 ENCOUNTER — Encounter: Payer: Self-pay | Admitting: Infectious Disease

## 2010-09-29 LAB — CULTURE, BLOOD (ROUTINE X 2)
Culture  Setup Time: 201202230615
Culture: NO GROWTH

## 2010-10-08 ENCOUNTER — Other Ambulatory Visit: Payer: Self-pay | Admitting: Neurology

## 2010-10-08 ENCOUNTER — Encounter: Payer: Self-pay | Admitting: *Deleted

## 2010-10-08 DIAGNOSIS — K5900 Constipation, unspecified: Secondary | ICD-10-CM | POA: Insufficient documentation

## 2010-10-08 DIAGNOSIS — H53129 Transient visual loss, unspecified eye: Secondary | ICD-10-CM

## 2010-10-08 DIAGNOSIS — E279 Disorder of adrenal gland, unspecified: Secondary | ICD-10-CM | POA: Insufficient documentation

## 2010-10-08 DIAGNOSIS — E039 Hypothyroidism, unspecified: Secondary | ICD-10-CM | POA: Insufficient documentation

## 2010-10-08 DIAGNOSIS — R599 Enlarged lymph nodes, unspecified: Secondary | ICD-10-CM | POA: Insufficient documentation

## 2010-10-08 DIAGNOSIS — I2510 Atherosclerotic heart disease of native coronary artery without angina pectoris: Secondary | ICD-10-CM | POA: Insufficient documentation

## 2010-10-12 NOTE — Miscellaneous (Signed)
Summary: set up for new pt  Clinical Lists Changes  Problems: Added new problem of CAD (ICD-414.00) Added new problem of CONSTIPATION (ICD-564.00) Added new problem of UNSPECIFIED HYPOTHYROIDISM (ICD-244.9) Added new problem of LYMPHADENOPATHY (ICD-785.6) Added new problem of UNSPECIFIED DISORDER OF ADRENAL GLANDS (ICD-255.9)

## 2010-10-19 NOTE — Consult Note (Signed)
Summary: [facility]  [facility]   Imported By: Florinda Marker 10/13/2010 10:57:43  _____________________________________________________________________  External Attachment:    Type:   Image     Comment:   External Document

## 2010-10-20 ENCOUNTER — Ambulatory Visit
Admission: RE | Admit: 2010-10-20 | Discharge: 2010-10-20 | Disposition: A | Discharge status: Home / Self Care | Payer: BC Managed Care – PPO | Source: Ambulatory Visit | Attending: Neurology | Admitting: Neurology

## 2010-10-20 DIAGNOSIS — H53129 Transient visual loss, unspecified eye: Secondary | ICD-10-CM

## 2010-10-20 MED ORDER — GADOBENATE DIMEGLUMINE 529 MG/ML IV SOLN
13.0000 mL | Freq: Once | INTRAVENOUS | Status: AC | PRN
  Administered 2010-10-20: 13 mL via INTRAVENOUS

## 2010-10-22 ENCOUNTER — Ambulatory Visit (HOSPITAL_COMMUNITY)
Admission: RE | Admit: 2010-10-22 | Discharge: 2010-10-22 | Disposition: A | Discharge status: Home / Self Care | Payer: BC Managed Care – PPO | Source: Ambulatory Visit | Attending: Gastroenterology | Admitting: Gastroenterology

## 2010-10-22 ENCOUNTER — Other Ambulatory Visit: Payer: Self-pay | Admitting: Gastroenterology

## 2010-10-22 DIAGNOSIS — R109 Unspecified abdominal pain: Secondary | ICD-10-CM | POA: Insufficient documentation

## 2010-10-22 DIAGNOSIS — R935 Abnormal findings on diagnostic imaging of other abdominal regions, including retroperitoneum: Secondary | ICD-10-CM | POA: Insufficient documentation

## 2010-10-25 ENCOUNTER — Ambulatory Visit: Payer: BC Managed Care – PPO | Admitting: Infectious Disease

## 2010-10-29 ENCOUNTER — Other Ambulatory Visit: Payer: Self-pay | Admitting: Obstetrics

## 2010-10-29 DIAGNOSIS — Z1231 Encounter for screening mammogram for malignant neoplasm of breast: Secondary | ICD-10-CM

## 2010-11-04 ENCOUNTER — Ambulatory Visit (HOSPITAL_COMMUNITY)
Admission: RE | Admit: 2010-11-04 | Discharge: 2010-11-04 | Disposition: A | Discharge status: Home / Self Care | Payer: BC Managed Care – PPO | Source: Ambulatory Visit | Attending: Obstetrics | Admitting: Obstetrics

## 2010-11-04 DIAGNOSIS — Z1231 Encounter for screening mammogram for malignant neoplasm of breast: Secondary | ICD-10-CM

## 2010-12-13 ENCOUNTER — Encounter (HOSPITAL_BASED_OUTPATIENT_CLINIC_OR_DEPARTMENT_OTHER): Payer: BC Managed Care – PPO | Admitting: Internal Medicine

## 2010-12-13 ENCOUNTER — Ambulatory Visit (HOSPITAL_COMMUNITY)
Admit: 2010-12-13 | Discharge: 2010-12-13 | Disposition: A | Discharge status: Home / Self Care | Payer: BC Managed Care – PPO | Attending: Internal Medicine | Admitting: Internal Medicine

## 2010-12-13 ENCOUNTER — Other Ambulatory Visit: Payer: Self-pay | Admitting: Internal Medicine

## 2010-12-13 DIAGNOSIS — R599 Enlarged lymph nodes, unspecified: Secondary | ICD-10-CM | POA: Insufficient documentation

## 2010-12-13 DIAGNOSIS — R161 Splenomegaly, not elsewhere classified: Secondary | ICD-10-CM | POA: Insufficient documentation

## 2010-12-13 DIAGNOSIS — D693 Immune thrombocytopenic purpura: Secondary | ICD-10-CM

## 2010-12-13 DIAGNOSIS — Z79899 Other long term (current) drug therapy: Secondary | ICD-10-CM | POA: Insufficient documentation

## 2010-12-13 LAB — CBC WITH DIFFERENTIAL/PLATELET
Basophils Absolute: 0 10*3/uL (ref 0.0–0.1)
EOS%: 2.5 % (ref 0.0–7.0)
HCT: 43.5 % (ref 34.8–46.6)
HGB: 14.7 g/dL (ref 11.6–15.9)
LYMPH%: 18.4 % (ref 14.0–49.7)
MCH: 29.9 pg (ref 25.1–34.0)
MCHC: 33.9 g/dL (ref 31.5–36.0)
MCV: 88.3 fL (ref 79.5–101.0)
MONO%: 5.9 % (ref 0.0–14.0)
NEUT%: 73.2 % (ref 38.4–76.8)

## 2010-12-13 LAB — CMP (CANCER CENTER ONLY)
AST: 21 U/L (ref 11–38)
Alkaline Phosphatase: 94 U/L — ABNORMAL HIGH (ref 26–84)
BUN, Bld: 8 mg/dL (ref 7–22)
Calcium: 8.9 mg/dL (ref 8.0–10.3)
Creat: 0.7 mg/dl (ref 0.6–1.2)
Total Bilirubin: 0.5 mg/dl (ref 0.20–1.60)

## 2010-12-13 LAB — LACTATE DEHYDROGENASE: LDH: 116 U/L (ref 94–250)

## 2010-12-13 MED ORDER — IOHEXOL 300 MG/ML  SOLN: 125.0000 mL | Freq: Once | INTRAMUSCULAR | Status: AC | PRN

## 2010-12-16 ENCOUNTER — Other Ambulatory Visit: Payer: Self-pay | Admitting: Internal Medicine

## 2010-12-16 ENCOUNTER — Encounter (HOSPITAL_BASED_OUTPATIENT_CLINIC_OR_DEPARTMENT_OTHER): Payer: BC Managed Care – PPO | Admitting: Internal Medicine

## 2010-12-16 DIAGNOSIS — D693 Immune thrombocytopenic purpura: Secondary | ICD-10-CM

## 2010-12-16 DIAGNOSIS — C8590 Non-Hodgkin lymphoma, unspecified, unspecified site: Secondary | ICD-10-CM

## 2010-12-17 NOTE — Consult Note (Signed)
[name], [name]               ACCOUNT NO.:  [id number]   MEDICAL RECORD NO.:  [id number]          PATIENT TYPE:  INP   LOCATION:  6738                         FACILITY:  [facility]   PHYSICIAN:  Lajuana Matte, MD  DATE OF BIRTH:  12/08/1955   DATE OF CONSULTATION:  11/15/2004  DATE OF DISCHARGE:                                   CONSULTATION   REASON FOR CONSULTATION:  Anemia and thrombocytopenia.   CONSULTING PHYSICIAN:  Jackie Plum, M.D.   HISTORY:  Ms. Chalker is a very pleasant 55 year old white female with past  medical history significant for hypothyroidism.  The patient mentioned that  her thyroid medication was changed recently and she was started on  Levothyroxine but a few weeks after starting this medication, she started  having significant swelling in her lower extremities.  It was getting worse  over the last three to four weeks.  She was seen by her primary care  physician and assured that this could be related to the change in medication  as well as her hypothyroidism.  Unfortunately, the condition worsened and  the patient started to have swelling and distention of her abdomen.  Blood  work done by her primary care physician on an outpatient basis showed  elevated level of BUN and creatinine.  The patient was admitted today for  further evaluation.  On admission, she was found to have abnormalities in  her CBC including hemoglobin and hematocrit of 7.7/23.5, respectively.  The  patient was also found to have platelet count of 14,000 but her white blood  cell count was normal at 10.1.  The patient also mentioned that over the  last two or three days she had a significant amount of hemorrhoidal  bleeding. Otherwise, she denied having any significant complaint except for  the swelling in her lower extremities and the abdominal distention.   ALLERGIES:  No known drug allergies.   CURRENT HOME MEDICATION:  Levothyroxine.   REVIEW OF SYMPTOMS:  Today she had  no fever, chills, headache, blurred  vision, double vision.  She denied having any dizziness.  No chest pain,  shortness of breath, cough or palpitations.  No nausea or vomiting but  continued to have abdominal distention and tenderness.  No constipation,  melena but the patient mentioned her recent history of hemorrhoidal bleed  with bright red blood. The patient denied having any hematuria, dysuria,  urgency or increased frequency.  No musculoskeletal or neurological  abnormalities.   PAST MEDICAL HISTORY:  Hypothyroidism.   FAMILY HISTORY:  Mother died of lung cancer at age 80.  Father died of  natural causes.  She denied having any family history of leukemia or  bleeding tendencies.   SOCIAL HISTORY:  She is married, has two children ages 76 and 45.  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 She smokes one  pack per day for the last 30 years.  She drinks alcohol occasionally.  No  history of drug abuse.   PHYSICAL EXAMINATION:  GENERAL APPEARANCE:  A very pleasant 55 year old  white female awake,  alert, in no acute distress.  VITAL SIGNS:  Blood pressure 145/93, pulse 103, respiratory rate 20,  temperature 97.8, oxygen saturation 100% on room air.  HEENT:  Normocephalic and atraumatic.  Clear oropharynx.  NECK:  Supple.  No lymphadenopathy.  CHEST:  Clear to auscultation bilaterally.  CARDIOVASCULAR:  Normal S1 and S2.  Regular rate and rhythm.  No murmurs,  rubs, or gallops.  ABDOMEN:  Very distended, mildly tender to palpation.  I was unable to  palpate any masses or hepatosplenomegaly because of the abdominal distention  and swelling there.  EXTREMITIES:  There was 3+ edema with erythema.  NEUROLOGIC:  No focal sensory or motor deficit.   LABORATORY DATA:  White blood cell count 10.1, hemoglobin 7.7, hematocrit  23.5, platelets 14.  Sodium 131, potassium 4.8, BUN 41, creatinine 2.7,  glucose 90, alkaline phosphatase 77, total bilirubin 0.6.  Peripheral blood  smear, I examined it and it showed few tear drops red blood cells; it has  only 1 to 2 schistocytes there per high powered field as well as  microcytosis but there is no significant abnormality consistent with TTP.   ASSESSMENT/PLAN:  This is a 55 year old white female presenting with  elevated abnormal renal function as well as anemia and thrombocytopenia.  The patient denied having any fever and there is no neurological  abnormalities and the peripheral blood smear also showed no evidence of  schistocytosis.  At this point, I think TTP is less likely but I cannot  completely exclude this at this point.  I would recommend continuous  observation for any other development.  At this point, I am worried about  thrombotic event in the abdomen probably affecting the renal vessels as well  as the inferior vena cava and that may have lead to her elevated renal  function as well as the lower extremity swelling and my recommendation at  this point is to proceed with ultrasound Doppler of the kidney and renal  vessels.  I may consider also checking CT scan of the abdomen.  If there are  no abnormalities on the ultrasound as well as the CT scan of the abdomen, I  may consider a trial of prednisone 1 mg/kg p.o. daily for questionable ITP  and at this point I will continue supportive care with transfusion of packed  rbc and platelets as needed.  In the meantime, I will check DIC panel, LDH,  haptoglobin as well as quantitative immunoglobulin and immunofixation to  rule out multiple myeloma.  I will only consider bone marrow biopsy and  aspirate if there is no other apparent abnormalities in the above studies or  if there is question of multiple myeloma in this patient.  I will continue  to follow the patient with you as needed.   Thank you so much for allowing me to participate in the care of Ms. Sibyl Parr.     MKM/MEDQ  D:  11/15/2004  T:  11/15/2004  Job:  [id number]

## 2010-12-17 NOTE — Discharge Summary (Signed)
NAMEKHRISTEN, [name]               ACCOUNT NO.:  [id number]   MEDICAL RECORD NO.:  [id number]          PATIENT TYPE:  INP   LOCATION:  2623                         FACILITY:  [facility]   PHYSICIAN:  Thereasa Solo. Little, M.D. DATE OF BIRTH:  11-27-1955   DATE OF ADMISSION:  09/13/2005  DATE OF DISCHARGE:  09/15/2005                                 DISCHARGE SUMMARY   ADMISSION DIAGNOSES:  1.  Chest pain with acute inferior myocardial infarction.  2.  History of renal insufficiency.  3.  Hypothyroidism.  4.  Idiopathic thrombocytopenia.  5.  History of anemia.  6.  History of hypertension.  7.  Anasarca, status post paracentesis during last hospitalization in April      of 2006.  8.  Bilateral effusions.   DISCHARGE DIAGNOSES:  1.  Chest pain with acute inferior myocardial infarction.  2.  History of renal insufficiency.  3.  Hypothyroidism.  4.  Idiopathic thrombocytopenia.  5.  History of anemia.  6.  History of hypertension.  7.  Anasarca, status post paracentesis during last hospitalization in April      of 2006.  8.  Bilateral effusions.   PROCEDURE:  Emergent cardiac catheterization, September 13, 2005, with PTCA  and stenting of the RCA, 100% occlusion to less than 10% occlusions, using a  non-drug eluting stent.   HISTORY OF PRESENT ILLNESS:  The patient is a 55 year old female with prior  history of coronary artery disease, who presented the a.m. of admission to  the [facility] complaining of chest pain. She had tried  Tums and Maalox, without any significant improvement and given 3 sublingual  nitroglycerin and baby aspirin at the urgent care with partial relief. Her  EKG revealed T-wave inversions in the anterior and lateral leads. She was  transported emergently to the ER at [facility].   She had a previous 2D echo from April of 2006, which showed an EF of 55% to  65% with a small pericardial effusion. She has a positive family history for  coronary artery disease. She has 2 children, continues to smoke a pack of  cigarettes a day.   For further history and physical, please see the dictated note.   HOSPITAL COURSE:  The patient was seen in the ER and was felt to be having  acute inferior MI. She was taken to the cath lab. She has a normally low  blood pressure, in the 89/60 range.   In the cath lab, she was found to have 100% occlusion of her RCA. This was  opened and a driver 1/61 stent was placed, with reduction of the lesion from  100% to less than 10%. A non-drug eluding stent was used because of her  history of ITP. She tolerated the procedure well and returned to ICU in  satisfactory condition. She was stable over night because of her thyroid.  TSH, the initial 1, was 0.028. The 2nd one 0.037. Her Synthroid dose was  decreased from 125 mcg to 112 mcg daily. She remained stable and by the a.m.  of September 15, 2005  she was seen by Dr. Clarene Duke, chest was clear. She had no  murmur and it was his opinion that she could be discharged home.   Plan to discharge the patient home on aldactone 25 mg daily, aspirin 325 mg  daily, Lopressor 25 mg b.i.d., Plavix 75 mg today, Protonix 40 mg daily,  Synthroid 112 mcg daily, Wellbutrin 150 mg daily, Zocor 20 mg daily and she  will be started on Chantix and she will be given a starter pack for the 1st  month and then she will be given 2 additional months with 1 mg b.i.d.   DISCHARGE ACTIVITIES:  Light to moderate, no lifting over 10 pounds, no  driving, no strenuous activity. She is encouraged to discontinue smoking.  She will be sent home on Wellbutrin and Chantix to help with this.   DISCHARGE LABS:  Shows a hemoglobin of 13.2, hematocrit of 38 and a white  count of 9000, platelets of 259,000.  Electrolytes are normal. BUN is 9,  creatinine is 0.7, glucose is 109. The patient's troponins were positive,  the 1st troponin was 39.75, the 2nd was 31.26, the 3rd was 17.65. The 1st CK   was 1039, the 2nd 899, the 3rd was 406. The 1st CK MB was 142, the 2nd was  102, the 3rd was 27.7. Total cholesterol was 135, LDL is 85, HDL is 21,  triglycerides are 147.   The patient will return to see Dr. Clarene Duke in 2 weeks, follow up with Dr.  Lorenz Coaster as instructed. We will arrange for her follow up in Dr. Fredirick Maudlin  office.   CONDITION ON DISCHARGE:  Improving.      Eber Hong, P.A.    ______________________________  Thereasa Solo Little, M.D.    WDJ/MEDQ  D:  09/15/2005  T:  09/15/2005  Job:  [id number]   cc:   Thereasa Solo. Little, M.D.  Fax: [phone]   Reuben Likes, M.D.  Fax: [phone]

## 2010-12-17 NOTE — Discharge Summary (Signed)
[name], [name]               ACCOUNT NO.:  [id number]   MEDICAL RECORD NO.:  [id number]          PATIENT TYPE:  INP   LOCATION:  0255                         FACILITY:  [facility]   PHYSICIAN:  Deirdre Peer. Polite, M.D. DATE OF BIRTH:  [birth year]   DATE OF ADMISSION:  11/15/2004  DATE OF DISCHARGE:  11/30/2004                                 DISCHARGE SUMMARY   DISCHARGE DIAGNOSES:  1.  Thrombocytopenia, presumptive diagnosis of idiopathic thrombocytopenia      purpura being followed by hematology, discharge platelet count 17.      Please note the patient is refractory to steroids and IVIG with minimal      response to Rituxan. For further outpatient followup.  2.  Acute renal failure, resolved. The patient is seen in consultation by      nephrology. Presumed secondary to autoimmune process. The patient had      multiple labs, essentially all negative. Renal biopsy was entertained,      however, secondary to thrombocytopenia, was not performed.  3.  Hypothyroidism.  4.  Hypertension.  5.  Anemia.  6.  Anasarca status post paracentesis of 5 liters of fluid.  7.  Bilateral pleural effusion.  8.  Leukocytosis secondary to steroids.   DISCHARGE MEDICATIONS:  1.  Lasix 80 mg twice a day.  2.  Potassium 40 mEq q.d.  3.  Decadron 18 mg twice daily with tapering dose as outlined by Dr.      Arbutus Ped.  4.  Levothyroxine 125 mcg q.d.   DISPOSITION:  The patient is discharged to home in stable condition. Asked  to follow up with [facility] May 26. The patient is also  asked to follow at the [facility] for labs in approximately one  week. The patient is also asked to follow up with Dr. Julian Reil in  approximately two to four weeks.   STUDIES:  The patient had a 2-D echocardiogram; preserved EF, no wall motion  abnormality. The patient underwent bone marrow biopsy. The patient had  paracentesis performed with removal of 5 liters of fluid. The patient had  lower  extremity Doppler, negative DVT. Two-D echocardiogram:  EF 55 to 65%,  no wall motion abnormalities, trivial pericardial effusion. CBC on  admission:  White count 0.1, hemoglobin 7.7, platelets 14. At discharge,  white count 15.1, hemoglobin 9.9, platelets 17. Retic count 2.4. BMET on  admission significant for creatinine 2.7; at time of discharge, creatinine  1.1. LFTs within normal limits. SPEP:  No monoclonal protein identified. TSH  14. Thyroglobulin antibody 128. Iron studies:  Iron 20, total iron binding  capacity 188, percent saturation 11, ferritin 565. Hepatitis panel A, B, and  C negative. HIV nonreactive. Haptoglobin 233. Complement C3 82, C4 14. Anti-  DNA negative. ANA positive, ANA titer 1,280.   HISTORY OF PRESENT ILLNESS:  A 55 year old female with known history of  hypothyroidism who was noted on outpatient evaluation to have progressive  lower extremity edema and acute renal failure noted on labs. Of note, the  patient had been followed on an outpatient basis and had  required several  courses of antibiotics for sinus infection. With further followup by primary  M.D., noted the patient had increasing fluid retention, elevated TSH, and  BMET showing acute renal failure. Admission to the hospital was recommended  for further evaluation and treatment of the patient's multiple lab  abnormalities. Please see dictated H&P for further details.   PAST MEDICAL HISTORY:  As stated above.   MEDICATIONS ON ADMISSION:  Levothyroxine.   FAMILY HISTORY:  Positive for diabetes.   SOCIAL HISTORY:  Per admission H&P.   HOSPITAL COURSE:  The patient was admitted to a medicine floor bed for  evaluation and treatment of multiple lab abnormalities. As stated, the  patient was found to have significant thrombocytopenia, anemia, and acute  renal failure. The differential diagnosis for the patient's multiple lab  abnormalities _____________. The patient was required to see hematologist  and  nephrologist. From a hematologic standpoint, it was entertained that the  patient possibly had ITP. The patient was ultimately started on prednisone  and IVIG with minimal response. The patient did require transfusion of  packed red cells and platelets during this hospitalization. The patient was  also seen in consultation by general surgery during this hospitalization for  possible splenectomy. Ultimately, it was determined that the patient would  continue a trial of Rituxan and followup with hematology on an outpatient  basis. The patient's diagnosis at this time is considered to be ITP.  1.  Acute renal failure. As stated on admission, the patient was found to      have creatinine of 2.7 which necessitated her seeing nephrology. The      differential for this was very broad which included lupus nephritis,      Wegener's, acute interstitial nephritis, crescentic glomerulonephritis,      multiple myeloma, HUS/TTP membranous proliferative glomerulonephritis      secondary to HEP B/C cryoglobulinemia , HIV, focal sclerosing      glomerulonephritis, post infectious GM, and other etiologies. The      patient had multiple lab studies to evaluate her renal function which      included renal ultrasound, 24-hour urine for protein electrophoresis,      complements, ANKA, C-ANKA, hepatitis studies, HIV studies,      cryoglobulins, ANA, double stranded DNA. While awaiting these labs, the      patient was given IV Lasix and Zaroxolyn to get rid of the excess      volume. The patient did not have any significant electrolyte problems      during this hospitalization or acidosis. During this hospitalization,      the patient's creatinine improved back to baseline. It was felt that the      patient may have some autoimmune process responsible for her platelets      as well as her renal dysfunction. Ultimately, nephrology wanted to do a     renal biopsy; however, because of the patient's thrombocytopenia,  this      was unable to be performed. Ultimately, it was thought that an      autoimmune process was taking place, and as the patient's renal function      improved with steroids, however, still had thrombocytopenia, no renal      biopsy is indicated at this time. It is recommended that the patient      have outpatient followup with [facility] for further      evaluation of her acute renal failure. Please note at the time of  discharge the patient's creatinine is within normal limits without      acidosis or any other electrolyte abnormalities.  2.  Anasarca/pleural effusion. This was on the basis of the patient's renal      dysfunction and low albumin with resultant third spacing of fluids. As      stated under problem #2, the patient was diuresed with improvement in      anasarca and pleural effusion. Also because of patient's anasarca, the      patient underwent paracentesis with removal of 5 liters of fluid.  3.  Hypothyroidism. Discharged on Synthroid.   The patient has other medical problems which are stable at this time.  Essentially, the duration of the patient's hospitalization was spent on  treatment of her thrombocytopenia, and as stated, tentatively the diagnosis  of ITP currently refractory to prednisone and IVIG, being treated with  Rituxan by Dr. Arbutus Ped. As for the patient's acute  renal failure, the patient's renal function is back to baseline, and the  patient will continue to be followed by [facility] on an  outpatient basis for further evaluation. Overall, the consensus between all  specialties is that the patient has possible autoimmune process.       RDP/MEDQ  D:  12/29/2004  T:  12/29/2004  Job:  [id number]   cc:   Schuyler Amor, M.D.  [address]  [address]  [place], [place] [address]  Fax: [phone]

## 2010-12-17 NOTE — H&P (Signed)
[name], [name]               ACCOUNT NO.:  [id number]   MEDICAL RECORD NO.:  [id number]          PATIENT TYPE:  INP   LOCATION:  6707                         FACILITY:  [facility]   PHYSICIAN:  Jackie Plum, M.D.DATE OF BIRTH:  24-May-1956   DATE OF ADMISSION:  11/15/2004  DATE OF DISCHARGE:                                HISTORY & PHYSICAL   CHIEF COMPLAINT:  Bilateral lower extremity swelling.   HISTORY OF PRESENT ILLNESS:  The patient is a 55 year old Caucasian lady who  presented with about a four week history of progressively worsening lower  extremity swelling.  History was obtained from the patient as well as her  PCP.  Apparently, [name] had been in her usual state of health until  sometime in early February of this year when she had her fifth episode of  sinus infection for which she was treated with antibiotics.  This recurred  again within four weeks and treatment was antibiotics.  The fifth antibiotic  was Ketac and the next antibiotic used was amoxicillin.  She said she was  doing fairly well until sometime mid March when she started to experience  lower extremity swelling.  She saw her PCP who checked her TSH because she  had a history of hypothyroidism.  Her TSH was noted to be very high and,  therefore, her thyroid medication was titrated gradually.  Around the same  time, she also changed her thyroid medicine to a generic type.  She saw her  PCP several times during which time her thyroid medicine was increased  adjusted to TSH levels.  She also had Lasix adjusted, as well.  During this  period, she has had some constipation and has been intolerant to cold  weather.  She has not had any chest pain, PND, or orthopnea.  She has not  had any easy fatigability or dyspnea on exertion, particularly.  She has not  had any fever, chills, cough, or sputum production.  She has not had any  chest pain.  The patient had gone to her PCP for follow up and blood work  done  indicated elevated creatinine which was new from her previous normal  creatinine as of 2004 and, therefore, her physician thought it expedient to  get the patient evaluated for acute versus subacute renal failure.   PAST MEDICAL HISTORY:  History of hypothyroidism.  She denies any history of  liver disease or heart disease.  She denies previous known history of kidney  disease.  She has not been feeling very well for the last four weeks as  stated above, however, she indicated that her appetite has been fairly  maintained well.   MEDICATIONS:  Levothyroxine 125 mg daily and Lasix.   FAMILY HISTORY:  Positive for diabetes mellitus.  No family history of bowel  or ovarian cancers.  She denies any known family history of bleeding  diastasis.   SOCIAL HISTORY:  The patient is married to her husband of more than 20  years.  They have two children, one of them is 23 and the other one is 17.  She used  to work for [employer] at [employer], however, she has not  been working for sometime now.  She had been smoking cigarettes for  sometime, but she does not smoke now.  She drinks alcohol occasionally on a  social basis.   REVIEW OF SYMPTOMS:  Significant positives as in HPI, the rest of the review  of systems were unremarkable.   PHYSICAL EXAMINATION:  VITAL SIGNS:  Blood pressure 145/83, pulse 103, temperature 97.8,  respiratory rate 20, O2 saturation 100% on room air.  GENERAL:  She was looking chronically ill, she is not in any acute  cardiopulmonary distress.  HEENT:  Normocephalic, atraumatic, pupils equal, round, reactive to light,  extraocular movements intact, she had conjunctival pallor with icterus.  NECK:  Supple, no JVD.  LUNGS:  Good breath sounds.  No crackles.  She had decreased breath sounds  at the lung bases bilaterally.  CARDIAC:  Regular rate and rhythm, no gallop, no murmur.  ABDOMEN:  Obese, soft, nontender.  She had positive shifting dullness, fluid  thrill  was noted.  Bowel sounds present and were normoactive.  SKIN:  She had a few old bruising on her lower and upper extremities.  She  did not have any evidence of ongoing mucosal bleed.  EXTREMITIES:  No cyanosis.  The patient had about 4+ pedal edema. Dorsalis  pedes pulses could not be appreciated, I guess, because of overt edema.  She, however, had good capillary refill.  CNS:  The patient was is alert and oriented x 3, no acute focal deficits.   LABORATORY DATA:  WBC 10.1, hemoglobin 7.7, hematocrit 27, MCV 84, platelet  count 40.  Sodium 131, potassium 4.8, chloride 102, CO2 20, glucose 90, BUN  41, creatinine 2.7, bilirubin 0.6, alkaline phos 27, AST 20, ALT 8, total  protein 6.4, albumin 1.9, calcium 7.6.   IMPRESSION AND PLAN:  1.  Severe thrombocytopenia in a patient with anemia and renal      insufficiency, to rule out TTP/microangiopathic hemolytic anemia,      differential would include multiple myeloma, however, platelet count is      too low for multiple myeloma.  ITP is very much unlikely, accompanying      thrombocytopenia which is not isolated, possible viral infection, in      view of history of previous sinus infections during January and February      2006 could also cause thrombocytopenia, however, this is very unlikely      in view of the overall picture.  1.  Obtain DIC panel, peripheral blast      smear to rule out any schistocytes or any abnormal cells tonight.  Dr.      Arbutus Ped who is said to be on call for hematology has been called and has      written his evaluation.  2.  Lower extremity edema, etiology include renal insufficiency with severe      hypoalbuminemia.  Will go ahead and obtain chest x-ray and 2D echo to      evaluate in this regard.  We will also obtain a 12 lead EKG.  3.  Renal insufficiency, acute versus subacute.  The patient will receive 24      hour urine evaluation for proteinuria and creatinine clearance.  She     will need to be seen by  nephrology.  She will also need renal      ultrasound.  4.  For her anemia, would initiate anemia workup and transfuse  after      hematology evaluation.  The patient is not severely symptomatic at this      point with respect to her anemia.  5.  We will obtain a TSH for completeness sake.   Further recommendations will be made as data and patient response dictates.      GO/MEDQ  D:  11/15/2004  T:  11/15/2004  Job:  [id number]   cc:   Schuyler Amor, M.D.  [address]  [place], [place] [address]  Fax: [phone]   Lajuana Matte, MD  Fax: [phone]

## 2010-12-17 NOTE — Consult Note (Signed)
[name], [name]               ACCOUNT NO.:  [id number]   MEDICAL RECORD NO.:  [id number]          PATIENT TYPE:  INP   LOCATION:  6738                         FACILITY:  [facility]   PHYSICIAN:  Terrial Rhodes, M.D.DATE OF BIRTH:  September 29, 1955   DATE OF CONSULTATION:  11/16/2004  DATE OF DISCHARGE:                                   CONSULTATION   ATTENDING PHYSICIAN:  Terrial Rhodes, M.D.   REASON FOR CONSULTATION:  Acute renal failure.   HISTORY OF PRESENT ILLNESS:  This is a 55 year old female with past medical  history of hypothyroidism was admitted to [name] on November 15, 2004, with anasarca, acute renal failure, thrombocytopenia, and anemia.  She had been in a normal state of health until February 2006 when her  levothyroxine was changed to Levothyroid 88 mcg. Following this change she  noted puffy eyes, ankle swelling, and constipation. Also in February 2006  she was treated for sinusitis times two with Ketek and amoxicillin. Dr.  Julian Reil over the past few months has been titrating Levothyroid up since  then to a dose of 125 mcg given the fact that he thought the swelling was  secondary to the thyroid abnormality. The patient's constipation resolved,  but her fluid overload worsened dramatically. She was given 40 mg of Lasix  by her primary doctor which had little effect. She noted a weight gain of  approximately 30 pounds in about three weeks, from 172 up to 202 pounds. In  addition she also noticed increased abdominal girth over the two weeks. She  currently has shortness of breath secondary to this increased abdominal  girth. She denies joint pain, diarrhea, NSAID use. No fever or chills.  Positive hemorrhoid bleeding of approximately one cupful which has now  stopped, bruising with bloodwork. Urine output normal per patient. No facial  rash. No tick bites or recent travel.   PAST MEDICAL HISTORY:  Hypothyroidism.   PAST SURGICAL HISTORY:  1.   Cesarean section.  2.  Appendectomy.   ALLERGIES:  No known drug allergies.   HOME MEDICATIONS:  1.  HCTZ 25 mg daily.  2.  Levothyroxine 125 mg daily.  3.  Lasix 40 mg daily, started on November 11, 2004.  4.  Correctol.   CURRENT MEDICATIONS:  1.  Packed red blood cells, two units.  2.  Platelets one unit.  3.  Cefazolin 1 gm q.12h.  4.  Tylenol.  5.  Ambien.  6.  IV fluids, normal saline KVO.   FAMILY HISTORY:  Mother passed away from lung cancer. Father passed away  from natural death. No history of bleeding diathesis or kidney disorder.   SOCIAL HISTORY:  Married times 20 years, two children (22 and 85 years of  age). Unemployed, previous employment at [name]. History of one pack per  day cigarette use. Occasional social alcohol use on weekends.   LABORATORY DATA:  Key labs: Hemoglobin on admission 7.7, platelet count  14,000, TSH 14, BUN 41, creatinine 2.7, calcium 7.6, albumin 1.9, AST 20,  ALT 8, alkaline phosphatase 77, total bilirubin 0.6, haptoglobin 233, LDH  143, fibrinogen 298, D-dimer 7.20, INR 1.1. CK 28.  Normal vitamin B, normal  folate, iron 20, TIBC 188, percent sat 11, ferritin 565.   Chest x-ray reveals mild atelectasis and peribronchial thickening. EKG  reveals normal sinus rhythm. Abdominal ultrasound showed no hydronephrosis,  normal size kidneys, and no evidence of portal vein thrombosis. Urinalysis  performed by Dr. Terrial Rhodes and myself showed large amount of blood,  2+ protein, granular casts, and multiple red blood cells and white blood  cells. CT of the abdomen and pelvis: Moderate bilateral pleural effusions  and findings consistent with diffuse anasarca. Normal kidneys, no lymph  nodes, moderate ascites, borderline splenomegaly.   PHYSICAL EXAMINATION:  VITAL SIGNS: 100.8, 102, 20, 130 to 149 over 83 to  93, 97% on room air, 24 hour ins an out reveals 720 in and 350 out which is  approximately 14 cc. Weight 101.4 kg. GFR calculates to  be 16.5.  GENERAL: Chronically ill appearing, in no apparent distress, has to pause  occasionally for catching breath.  HEENT: PERRLA. Extraocular muscles intact. Oropharynx clear. Nares clear.  NECK: Mild thyromegaly. No nodules.  NEUROLOGIC: Alert and oriented times three. Cranial nerves II-XII grossly  intact. Sensation within normal limits. Strength 5/5 in upper extremities.  Cannot lift legs secondary to weight.  CARDIOVASCULAR: Regular rate and rhythm. No murmurs, rubs, or gallops.  Normal PMI. Pulses 2+.  PULMONARY: Shallow inspiration. Clear to auscultation bilaterally. No rales,  rhonchi, or wheezes.  ABDOMEN: Tense, positive fluid wave, distended. Tenderness in the left upper  quadrant. No hepatosplenomegaly detected on physical exam.  EXTREMITIES: 4+ pitting edema bilaterally. Positive petechia on calves.  Positive erythema bilaterally. No increased warmth.  GENITOURINARY: Foley in place.  RECTAL EXAM: Deferred.   ASSESSMENT/PLAN:  1.  A 55 year old female with history of sinus infection, given antibiotics      times two, and hypothyroidism now with anasarca, renal failure,      thrombocytopenia, and anemia. Her differential diagnosis is very broad.      Her differential diagnosis includes lupus nephritis, Wegener's acute      interstitial nephritis, crescentic glomerulonephritis, multiple myeloma,      HUS/TTP, MPGN secondary to hepatitis B or C, cryoglobulinemia, HIV,      focal segmental glomerular sclerosis, IgA nephropathy, post infectious      glomerulonephritis, and HSV. We will evaluate these with double-stranded      DNA complement levels, anti-GBN, hepatitis panel, HIV, S-PEP, U-PEP, P-      ANCA, C-ANCA, urine eosinophils, serum cryoglobulin, ASO titer, Erin, and      HIV. We will treat the patient's volume overload status with Lasix      diuresis. Strict ins and outs and daily weights will be measured. If her     volume overload is refractory we will dialyze her,  although at the      moment there is no acute indication for this. She is going to undergo      paracentesis for fluid removal. If the etiology is not elucidated, with      above testing, we will do a renal biopsy once her platelets increase.  2.  Thrombocytopenia: Possibly secondary to etiologies of #1. No bleeding,      currently stable.  3.  Fluid overload: Most likely secondary to #1, but echo is pending to      evaluate for congestive heart failure. There are no obvious signs or      symptoms as cause at the  moment.  4.  Hypothyroidism: Titrate Synthroid.      AB/MEDQ  D:  11/16/2004  T:  11/16/2004  Job:  [id number]

## 2010-12-17 NOTE — Cardiovascular Report (Signed)
[name], [name]               ACCOUNT NO.:  [id number]   MEDICAL RECORD NO.:  [id number]          PATIENT TYPE:  INP   LOCATION:  1824                         FACILITY:  [facility]   PHYSICIAN:  Darlin Priestly, MD  DATE OF BIRTH:  [birth year]   DATE OF PROCEDURE:  09/13/2005  DATE OF DISCHARGE:                              CARDIAC CATHETERIZATION   PROCEDURES:  1.  Left heart catheterization.  2.  Coronary angiography.  3.  Left ventriculogram.  4.  Right coronary artery, proximal, percutaneous transluminal coronary      balloon angioplasty, placement of intracoronary stent.   ATTENDING:  Darlin Priestly, M.D.   COMPLICATIONS:  None.   INDICATIONS:  [name] is a 55 year old female patient of Dr. Lorenz Coaster with  a history of hypothyroidism, ITP, a history of anemia, hypertension, ongoing  tobacco use, who was admitted on September 13, 2005, with complaint of two  days' worth of stuttering chest pain.  She was subsequently seen at the [facility] urgent care on the morning of February 13, where she was noted to  have inferior ST elevation.  She is now referred to [facility],  where she is now noted to have resolution of the inferior ST elevation but  she did have diffuse inferolateral T-wave inversions.  She is now brought  for cardiac catheterization and possible PCI.   DESCRIPTION OF PROCEDURE:  After giving informed written consent, the  patient brought to the cardiac catheterization lab.  The right and left  groin were shaved, prepped and draped in the usual sterile fashion.  Anesthesia monitoring was established.  Using the modified Seldinger  technique, a #6 French arterial sheath was inserted in the right femoral  artery.  Diagnostic 6 French diagnostic catheters were used to perform  diagnostic angiography.   The left main is a large vessel with no significant disease.   The LAD is a medium to large-sized vessel which coursed to the apex and  gives  rise to one diagonal branch.  The LAD has mild 30% proximal narrowing.  The remainder of the LAD has no significant disease.   The first diagonal is a medium-sized vessel with no significant disease.   The left circumflex is a medium-sized vessel which coursed to the AV groove  and gives rise to three obtuse marginal branches.  The AV groove circumflex  has no significant disease.   The first OM is a medium-sized vessel which bifurcates distally, with no  significant disease.   The second and third OMs are small vessels with no significant disease.   There is a faint left-to-right filling to the distal RCA, PDA and  posterolateral branch.   The right coronary artery is totally occluded in its proximal segment.   Left ventriculogram reveals a low normal EF of 50% with inferior and  posterior basal hypokinesis.   HEMODYNAMICS:  Systemic arterial pressure 104/63, LV systemic pressure  104/11, LVEDP of 23.   INTERVENTIONAL PROCEDURE:  Placement RCA, proximal:  Following diagnostic  angiography, a 6 [place] JR4 guiding catheter with side holes was __________  engaged in the right coronary ostium.  Next a 0.014 HW marker wire was  placed beside the guiding catheter and used to cross the proximal RCA  stenotic lesion.  This was positioned in the distal PDA without difficulty.  Next a Voyager 2.5 x 15 mm balloon was then tracked over the guidewire and  three inflations to a maximum of 8 atmospheres were performed for a total of  approximately 1 minute 10 seconds.  Follow-up angiogram revealed restoration  of TIMI-3 flow with a long 25-26 mm lesion in the proximal RCA.  This  balloon was removed and a 3.0 x 30 mm Driver was then used to cross the  proximal RCA stenotic lesion.  It should be noted that we elected to use a  non-DES stent as the patient does have a history of ITP and there was  concern over prolonged Plavix usage.  This stent was then deployed to 9  atmospheres for a total  of 25 seconds.  A second inflation to 16 atmospheres  was performed for a total of 21 seconds.  Follow-up angiogram revealed no  evidence of dissection or thrombus with TIMI-3 flow in the distal vessel.  IV Angiomax was used throughout the case.   Final orthogonal angiograms revealed less than 10% residual stenosis in the  proximal RCA with TIMI-3 flow to the distal vessel.  At this point we  elected to conclude the procedure.  All balloons, wire and catheters were  removed.  Hemostatic sheath was sewn in place and the patient was returned  back to the ward in stable condition.   CONCLUSIONS:  1.  Successful percutaneous transluminal coronary balloon angioplasty and      placement of a Driver 3.0 x 30 mm stent in the proximal right coronary      artery stenotic lesion.  2.  Low normal ejection fraction with inferior and posterior basal      hypokinesis.  3.  Elevated left ventricular end-diastolic pressure.  4.  Adjunct use of Angiomax infusion.      Darlin Priestly, MD  Electronically Signed     RHM/MEDQ  D:  09/13/2005  T:  09/14/2005  Job:  [id number]   cc:   Reuben Likes, M.D.  Fax: [phone]

## 2011-01-18 ENCOUNTER — Encounter (INDEPENDENT_AMBULATORY_CARE_PROVIDER_SITE_OTHER): Payer: Self-pay | Admitting: General Surgery

## 2011-01-19 ENCOUNTER — Other Ambulatory Visit (INDEPENDENT_AMBULATORY_CARE_PROVIDER_SITE_OTHER): Payer: Self-pay | Admitting: General Surgery

## 2011-01-19 ENCOUNTER — Encounter (HOSPITAL_COMMUNITY): Payer: BC Managed Care – PPO

## 2011-01-19 LAB — DIFFERENTIAL
Basophils Absolute: 0.1 10*3/uL (ref 0.0–0.1)
Eosinophils Relative: 3 % (ref 0–5)
Lymphocytes Relative: 19 % (ref 12–46)
Monocytes Absolute: 0.8 10*3/uL (ref 0.1–1.0)

## 2011-01-19 LAB — CBC
HCT: 46 % (ref 36.0–46.0)
MCH: 29.4 pg (ref 26.0–34.0)
MCV: 87.8 fL (ref 78.0–100.0)
RDW: 15 % (ref 11.5–15.5)
WBC: 10.6 10*3/uL — ABNORMAL HIGH (ref 4.0–10.5)

## 2011-01-19 LAB — COMPREHENSIVE METABOLIC PANEL
Alkaline Phosphatase: 110 U/L (ref 39–117)
BUN: 7 mg/dL (ref 6–23)
Chloride: 102 mEq/L (ref 96–112)
GFR calc Af Amer: >60 mL/min (ref >60)
Glucose, Bld: 89 mg/dL (ref 70–99)
Potassium: 4.6 mEq/L (ref 3.5–5.1)
Total Bilirubin: 0.2 mg/dL — ABNORMAL LOW (ref 0.3–1.2)
Total Protein: 8 g/dL (ref 6.0–8.3)

## 2011-01-19 LAB — SURGICAL PCR SCREEN: MRSA, PCR: NEGATIVE

## 2011-01-24 ENCOUNTER — Other Ambulatory Visit (INDEPENDENT_AMBULATORY_CARE_PROVIDER_SITE_OTHER): Payer: Self-pay | Admitting: General Surgery

## 2011-01-24 ENCOUNTER — Ambulatory Visit (HOSPITAL_COMMUNITY)
Admission: RE | Admit: 2011-01-24 | Discharge: 2011-01-24 | Disposition: A | Discharge status: Home / Self Care | Payer: BC Managed Care – PPO | Source: Ambulatory Visit | Attending: General Surgery | Admitting: General Surgery

## 2011-01-24 DIAGNOSIS — I10 Essential (primary) hypertension: Secondary | ICD-10-CM | POA: Insufficient documentation

## 2011-01-24 DIAGNOSIS — I252 Old myocardial infarction: Secondary | ICD-10-CM | POA: Insufficient documentation

## 2011-01-24 DIAGNOSIS — I2510 Atherosclerotic heart disease of native coronary artery without angina pectoris: Secondary | ICD-10-CM | POA: Insufficient documentation

## 2011-01-24 DIAGNOSIS — Z01812 Encounter for preprocedural laboratory examination: Secondary | ICD-10-CM | POA: Insufficient documentation

## 2011-01-24 DIAGNOSIS — Z9861 Coronary angioplasty status: Secondary | ICD-10-CM | POA: Insufficient documentation

## 2011-01-24 DIAGNOSIS — F17200 Nicotine dependence, unspecified, uncomplicated: Secondary | ICD-10-CM | POA: Insufficient documentation

## 2011-01-24 DIAGNOSIS — R599 Enlarged lymph nodes, unspecified: Secondary | ICD-10-CM | POA: Insufficient documentation

## 2011-01-27 ENCOUNTER — Telehealth (INDEPENDENT_AMBULATORY_CARE_PROVIDER_SITE_OTHER): Payer: Self-pay

## 2011-01-27 NOTE — Telephone Encounter (Signed)
Patient called c/o swelling, slight redness, bruising and tenderness at left axillary incision from lymph node surgery.  She has no fever or drainage.  I spoke to Dr Abbey Chatters who said this is expected and to tell her the pathology result is at this time benign, not cancer.  He does not have the final yet.  I did advise the pt symptoms expected and her path is not cancer.  I told her the doctor or nurse will call when the final is in.

## 2011-01-28 ENCOUNTER — Telehealth (INDEPENDENT_AMBULATORY_CARE_PROVIDER_SITE_OTHER): Payer: Self-pay | Admitting: General Surgery

## 2011-01-28 NOTE — Telephone Encounter (Signed)
Can pt fly on plane had sx on 6/25

## 2011-01-28 NOTE — Telephone Encounter (Signed)
In PO from lymphadenopathy sx would like to know if she can fly on airplane..Thanks

## 2011-01-31 NOTE — Op Note (Signed)
  [name], [name]               ACCOUNT NO.:  [id number]  MEDICAL RECORD NO.:  [id number]  LOCATION:  DAYL                         FACILITY:  [facility]  PHYSICIAN:  Adolph Pollack, M.D.DATE OF BIRTH:  [birth year]  DATE OF PROCEDURE:  01/24/2011 DATE OF DISCHARGE:                              OPERATIVE REPORT   PREOPERATIVE DIAGNOSIS:  Lymphadenopathy.  POSTOPERATIVE DIAGNOSIS:  Lymphadenopathy.  PROCEDURE:  Deep left axillary lymph node biopsy.  SURGEON:  Adolph Pollack, M.D.  ANESTHESIA:  General/LMA with Marcaine local.  INDICATIONS:  [name] is a 55 year old female who went in the hospital with some abdominal pain and hepatic CT scans performed demonstrating some lymphadenopathy.  Image-guided biopsy of a right subpectoral lymph node demonstrated lymphoid hyperplasia.  There was an enlarged lymph node in the left axilla.  There is still concern about low-grade lymphoma and thus she presents now for left axillary lymph node biopsy.  We discussed the procedure, risks and aftercare preoperatively.  TECHNIQUE:  She was seen in the holding area and the left anterior shoulder marked with my initials.  She is then brought to the operating room, placed supine on the operating table and general anesthetic was administered LMA.  The left axillary area was sterilely prepped and draped.  I made a transverse incision in the lower axillary line through the skin and subcutaneous tissue.  I then entered the axillary content and was able to palpate enlargements.  Using blunt dissection, I isolated an enlarged axillary lymph node.  This was close to the thoracodorsal nerve.  Using blunt dissection, I dissected it free from that and then I ligated the lymphatics with Vicryl ties and removed this lymph node, put it in saline.  There was another enlargement next it to also, closed the thoracodorsal nerve which I separated using blunt dissection, ligated lymphatics and then  removed it and send both these lymph nodes fresh to pathology for evaluation.  I then tested the thoracodorsal nerve and its function was intact.  I inspected the area and hemostasis was adequate.  I then injected Marcaine solution into the subcutaneous tissues of the wound.  The subcutaneous tissues were reapproximated with interrupted 3-0 Vicryl sutures.  The skin was closed with a 4-0 Monocryl subcuticular stitch. Steri-Strips and sterile dressings were applied.  She tolerated the procedure well without any apparent complications and was taken to recovery in satisfactory condition.     Adolph Pollack, M.D.     Kari Baars  D:  01/24/2011  T:  01/24/2011  Job:  [id number]  cc:   Lajuana Matte, M.D. Fax: [phone]  Barry Dienes. Eloise Harman, M.D. Fax: [phone]  Electronically Signed by Avel Peace M.D. on 01/31/2011 78:46:96 AM

## 2011-02-03 ENCOUNTER — Encounter (INDEPENDENT_AMBULATORY_CARE_PROVIDER_SITE_OTHER): Payer: Self-pay | Admitting: General Surgery

## 2011-02-04 ENCOUNTER — Ambulatory Visit (INDEPENDENT_AMBULATORY_CARE_PROVIDER_SITE_OTHER): Payer: BC Managed Care – PPO | Admitting: General Surgery

## 2011-02-04 ENCOUNTER — Encounter (INDEPENDENT_AMBULATORY_CARE_PROVIDER_SITE_OTHER): Payer: Self-pay | Admitting: General Surgery

## 2011-02-04 DIAGNOSIS — IMO0002 Reserved for concepts with insufficient information to code with codable children: Secondary | ICD-10-CM

## 2011-02-04 NOTE — Progress Notes (Signed)
Subjective:     Patient ID: [name], female   DOB: 1955/09/15, 55 y.o.   MRN: [id number]    There were no vitals taken for this visit.    HPI  she returns today in for wound check in one week status post excision of left axillary lymph node for biopsy by Dr. Abbey Chatters.  Since her procedure, her brother has died and she has been traveling for the funeral. She reports that she has possibly "neglected" the area because of the other social issues going on. She complains of swelling in the area and a small amount of bloody drainage onto the dressing but denies any increasing pain fevers or chills. She also has some bruising in the area with extension onto the left breast.  Review of Systems     Objective:   Physical Exam    On exam she is in no acute distress and nontoxic-appearing  Her incision is approximated without any sign of infection she still has a few remaining Steri-Strips in place. She has a palpable firm, fluid collection under the incision with surrounding ecchymosis and bruising which extends on the lateral chest wall and onto the left breast. There is no warmth or erythema or other sign of infection. This is most likely a postoperative hematoma. Assessment:     Postop hematoma    Plan:     This is most likely a postoperative hematoma without signs of active infection. She has had a small amount of drainage from the medial aspect of the wound but the wound appears approximated currently. This is a palpable "knot" under the incision and tenderness I suspect that this will resolve with time. Again, there is no sign of active infection and I would not recommend drainage at this time.

## 2011-02-04 NOTE — Patient Instructions (Signed)
Return to clinic or ER if redness, increased pain, or fevers, or purulent drainage

## 2011-02-10 ENCOUNTER — Ambulatory Visit (INDEPENDENT_AMBULATORY_CARE_PROVIDER_SITE_OTHER): Payer: BC Managed Care – PPO | Admitting: General Surgery

## 2011-02-10 DIAGNOSIS — S40029A Contusion of unspecified upper arm, initial encounter: Secondary | ICD-10-CM

## 2011-02-10 NOTE — Progress Notes (Signed)
She is here for a postoperative visit after left axillary lymph node biopsy. Pathology demonstrates angiofollicular lymphoid hyperplasia.  She's had some bloody drainage from the incision and a lot of swelling and bruising.  Exam: Left axilla incision is clean and intact and dry. Firmness and surrounding ecchymosis extending down to the left breast.  Assessment: Postoperative hematoma. Pathology is benign.  Plan: Warm compresses to the area twice a day. Limited activity with left arm. Return visit in 6 weeks.

## 2011-03-14 ENCOUNTER — Encounter (HOSPITAL_BASED_OUTPATIENT_CLINIC_OR_DEPARTMENT_OTHER): Payer: BC Managed Care – PPO | Admitting: Internal Medicine

## 2011-03-14 ENCOUNTER — Other Ambulatory Visit: Payer: Self-pay | Admitting: Internal Medicine

## 2011-03-14 ENCOUNTER — Other Ambulatory Visit (HOSPITAL_COMMUNITY): Payer: BC Managed Care – PPO

## 2011-03-14 DIAGNOSIS — D693 Immune thrombocytopenic purpura: Secondary | ICD-10-CM

## 2011-03-14 LAB — CBC WITH DIFFERENTIAL/PLATELET
EOS%: 1.6 % (ref 0.0–7.0)
MCH: 30.7 pg (ref 25.1–34.0)
MCHC: 33.9 g/dL (ref 31.5–36.0)
MCV: 90.6 fL (ref 79.5–101.0)
MONO%: 8.2 % (ref 0.0–14.0)
RBC: 4.8 10*6/uL (ref 3.70–5.45)
RDW: 15 % — ABNORMAL HIGH (ref 11.2–14.5)

## 2011-03-14 LAB — COMPREHENSIVE METABOLIC PANEL
ALT: 5 U/L (ref 0–35)
AST: 13 U/L (ref 0–37)
Albumin: 3.6 g/dL (ref 3.5–5.2)
Calcium: 9.3 mg/dL (ref 8.4–10.5)
Chloride: 101 mEq/L (ref 96–112)
Potassium: 4 mEq/L (ref 3.5–5.3)
Sodium: 136 mEq/L (ref 135–145)
Total Protein: 8.5 g/dL — ABNORMAL HIGH (ref 6.0–8.3)

## 2011-03-14 LAB — LACTATE DEHYDROGENASE: LDH: 161 U/L (ref 94–250)

## 2011-03-17 ENCOUNTER — Encounter (HOSPITAL_BASED_OUTPATIENT_CLINIC_OR_DEPARTMENT_OTHER): Payer: BC Managed Care – PPO | Admitting: Internal Medicine

## 2011-03-17 DIAGNOSIS — D693 Immune thrombocytopenic purpura: Secondary | ICD-10-CM

## 2011-03-28 ENCOUNTER — Encounter (INDEPENDENT_AMBULATORY_CARE_PROVIDER_SITE_OTHER): Payer: Self-pay | Admitting: General Surgery

## 2011-03-29 ENCOUNTER — Ambulatory Visit (INDEPENDENT_AMBULATORY_CARE_PROVIDER_SITE_OTHER): Payer: BC Managed Care – PPO | Admitting: General Surgery

## 2011-03-29 DIAGNOSIS — IMO0002 Reserved for concepts with insufficient information to code with codable children: Secondary | ICD-10-CM

## 2011-03-29 NOTE — Patient Instructions (Signed)
Activities as tolerated with left arm. 

## 2011-03-29 NOTE — Progress Notes (Signed)
[name] is here for second postoperative visit to follow up on her left axillary wound hematoma. He says her area is still firm at times. She has been using a heating pad.  Physical exam: Left axilla-wound is clean, ecchymosis is significantly decreased as is the swelling.  Assessment: Resolving wound hematoma.  Plan: Activities with left arm as tolerated. Return visit p.r.n.

## 2011-04-28 ENCOUNTER — Ambulatory Visit
Admission: RE | Admit: 2011-04-28 | Discharge: 2011-04-28 | Disposition: A | Discharge status: Home / Self Care | Payer: BC Managed Care – PPO | Source: Ambulatory Visit | Attending: Internal Medicine | Admitting: Internal Medicine

## 2011-04-28 ENCOUNTER — Other Ambulatory Visit: Payer: Self-pay | Admitting: Internal Medicine

## 2011-04-28 DIAGNOSIS — R1011 Right upper quadrant pain: Secondary | ICD-10-CM

## 2011-04-28 MED ORDER — IOHEXOL 300 MG/ML  SOLN: 100.0000 mL | Freq: Once | INTRAMUSCULAR | Status: AC | PRN

## 2011-05-03 ENCOUNTER — Other Ambulatory Visit (HOSPITAL_COMMUNITY): Payer: Self-pay | Admitting: Internal Medicine

## 2011-05-03 DIAGNOSIS — R1011 Right upper quadrant pain: Secondary | ICD-10-CM

## 2011-05-06 ENCOUNTER — Other Ambulatory Visit: Payer: Self-pay | Admitting: Interventional Radiology

## 2011-05-06 ENCOUNTER — Ambulatory Visit (HOSPITAL_COMMUNITY)
Admission: RE | Admit: 2011-05-06 | Discharge: 2011-05-06 | Disposition: A | Discharge status: Home / Self Care | Payer: BC Managed Care – PPO | Source: Ambulatory Visit | Attending: Internal Medicine | Admitting: Internal Medicine

## 2011-05-06 DIAGNOSIS — E039 Hypothyroidism, unspecified: Secondary | ICD-10-CM | POA: Insufficient documentation

## 2011-05-06 DIAGNOSIS — D649 Anemia, unspecified: Secondary | ICD-10-CM | POA: Insufficient documentation

## 2011-05-06 DIAGNOSIS — R1011 Right upper quadrant pain: Secondary | ICD-10-CM

## 2011-05-06 DIAGNOSIS — R599 Enlarged lymph nodes, unspecified: Secondary | ICD-10-CM | POA: Insufficient documentation

## 2011-05-06 DIAGNOSIS — J90 Pleural effusion, not elsewhere classified: Secondary | ICD-10-CM | POA: Insufficient documentation

## 2011-05-06 DIAGNOSIS — R109 Unspecified abdominal pain: Secondary | ICD-10-CM | POA: Insufficient documentation

## 2011-05-06 DIAGNOSIS — F17200 Nicotine dependence, unspecified, uncomplicated: Secondary | ICD-10-CM | POA: Insufficient documentation

## 2011-05-06 DIAGNOSIS — I2510 Atherosclerotic heart disease of native coronary artery without angina pectoris: Secondary | ICD-10-CM | POA: Insufficient documentation

## 2011-05-06 DIAGNOSIS — Z9861 Coronary angioplasty status: Secondary | ICD-10-CM | POA: Insufficient documentation

## 2011-05-06 LAB — CBC
MCH: 27.8 pg (ref 26.0–34.0)
MCHC: 33.5 g/dL (ref 30.0–36.0)
MCV: 83 fL (ref 78.0–100.0)
Platelets: 129 10*3/uL — ABNORMAL LOW (ref 150–400)
RDW: 13.7 % (ref 11.5–15.5)

## 2011-05-06 LAB — PROTIME-INR: Prothrombin Time: 15.3 seconds — ABNORMAL HIGH (ref 11.6–15.2)

## 2011-05-12 ENCOUNTER — Other Ambulatory Visit (HOSPITAL_COMMUNITY): Payer: BC Managed Care – PPO

## 2011-05-19 ENCOUNTER — Encounter (INDEPENDENT_AMBULATORY_CARE_PROVIDER_SITE_OTHER): Payer: Self-pay | Admitting: General Surgery

## 2011-05-19 ENCOUNTER — Ambulatory Visit (INDEPENDENT_AMBULATORY_CARE_PROVIDER_SITE_OTHER): Payer: BC Managed Care – PPO | Admitting: General Surgery

## 2011-05-19 VITALS — BP 124/86 | HR 72 | Temp 97.9°F | Resp 18 | Ht 63.0 in | Wt 155.2 lb

## 2011-05-19 DIAGNOSIS — R590 Localized enlarged lymph nodes: Secondary | ICD-10-CM

## 2011-05-19 DIAGNOSIS — R599 Enlarged lymph nodes, unspecified: Secondary | ICD-10-CM

## 2011-05-19 NOTE — Progress Notes (Signed)
Chief Complaint  Patient presents with  . Follow-up    Evaluate lymph node    HPI [name] is a 55 y.o. female.   HPI  She is referred by Dr. Eloise Harman for open biopsy of retroperitoneal lymphadenopathy. She has been followed for her retroperitoneal lymphadenopathy by Dr. Eloise Harman and Dr. Arbutus Ped. The adenopathy has been unchanged on serial CT scans. She underwent an image guided biopsy of a retroperitoneal lymph node which was nondiagnostic May 06, 2011. On January 24, 2011 I performed an open biopsy of a left axillary lymph node. Pathology revealed angiofollicular lymphoid hyperplasia and findings consistent with Castleman's disease.  Recently she developed upper abdominal pain and had a repeat CT scan which showed stable lymphadenopathy but diffuse mesenteric, retroperitoneal, and subcutaneous edema suggesting anasarca. Her TSH was 13.4 to with normal being no higher than 4.2. She describes the swelling of her abdominal wall in her face and legs. She is currently taking 125 mcg of Synthroid a day. The swelling has gone down and her abdominal pain is improved.  Past Medical History  Diagnosis Date  . Hypothyroidism   . Heart attack 2007  . Chronic kidney disease     failure  . Heart attack 2007  . Retroperitoneal lymphadenopathy 2012  . Castleman's disease     Past Surgical History  Procedure Date  . Cesarean section 1983 1989  . Appendectomy     55 years old    Family History  Problem Relation Age of Onset  . Cancer Mother     lung  . Heart disease Father     Social History History  Substance Use Topics  . Smoking status: Current Everyday Smoker -- 0.5 packs/day  . Smokeless tobacco: Not on file  . Alcohol Use: Yes    Allergies  Allergen Reactions  . Cefdinir Diarrhea    Current Outpatient Prescriptions  Medication Sig Dispense Refill  . ALPRAZolam (XANAX) 0.25 MG tablet Take 0.25 mg by mouth at bedtime as needed.        Marland Kitchen aspirin 81 MG tablet Take 81 mg by  mouth daily.        Marland Kitchen levothyroxine (SYNTHROID, LEVOTHROID) 100 MCG tablet Take 100 mcg by mouth daily.        . metoprolol succinate (TOPROL-XL) 25 MG 24 hr tablet Take 25 mg by mouth daily.          Review of Systems Review of Systems  Constitutional: Positive for fatigue.  HENT: Positive for facial swelling.   Respiratory: Negative for cough and shortness of breath.   Cardiovascular: Positive for leg swelling. Negative for chest pain.  Gastrointestinal:       No nausea or vomiting.    Blood pressure 124/86, pulse 72, temperature 97.9 F (36.6 C), temperature source Temporal, resp. rate 18, height 5\' 3"  (1.6 m), weight 155 lb 3.2 oz (70.398 kg).  Physical Exam Physical Exam  Constitutional: She appears well-developed and well-nourished. No distress.  HENT:       Mild facial edema.  Eyes: Conjunctivae are normal.  Cardiovascular: Normal rate and regular rhythm.   No murmur heard. Pulmonary/Chest: Effort normal and breath sounds normal.  Abdominal: Soft. She exhibits no distension and no mass. There is no tenderness.  Musculoskeletal: She exhibits edema (in lower extremities).       Left axillary adenopathy and well healed incision.  Lymphadenopathy:    She has cervical adenopathy (right neck area).    Data Reviewed Her recent CT scan was reviewed  with the radiologist. Notes from Dr.[name]'s office were reviewed.  Assessment    Diffuse lymphadenopathy in a woman with known Castleman's disease. No change in the retroperitoneal lymphadenopathy. There still seemed to be some concern about the potential for lymphoma.  I attempted to speak with Dr. Arbutus Ped but he is on vacation.    Plan    Biopsy of these retroperitoneal lymph nodes require exploratory laparotomy.  Prior to doing this, I would like to discuss the situation with Dr. Arbutus Ped.  If he feels strongly that this is what she needs that I will have her come back and discuss procedure, aftercare, and risks with her.  She understands this plan and agrees.       Rhemi Balbach J 05/19/2011, 4:00 PM

## 2011-05-28 ENCOUNTER — Inpatient Hospital Stay (HOSPITAL_COMMUNITY)
Admission: EM | Admit: 2011-05-28 | Discharge: 2011-06-02 | DRG: 398 | Disposition: A | Discharge status: Home / Self Care | Payer: BC Managed Care – PPO | Attending: Internal Medicine | Admitting: Internal Medicine

## 2011-05-28 ENCOUNTER — Emergency Department (HOSPITAL_COMMUNITY): Payer: BC Managed Care – PPO

## 2011-05-28 DIAGNOSIS — D649 Anemia, unspecified: Secondary | ICD-10-CM | POA: Diagnosis present

## 2011-05-28 DIAGNOSIS — Z833 Family history of diabetes mellitus: Secondary | ICD-10-CM

## 2011-05-28 DIAGNOSIS — Z79899 Other long term (current) drug therapy: Secondary | ICD-10-CM

## 2011-05-28 DIAGNOSIS — R609 Edema, unspecified: Secondary | ICD-10-CM | POA: Diagnosis present

## 2011-05-28 DIAGNOSIS — F411 Generalized anxiety disorder: Secondary | ICD-10-CM | POA: Diagnosis present

## 2011-05-28 DIAGNOSIS — R110 Nausea: Secondary | ICD-10-CM | POA: Diagnosis present

## 2011-05-28 DIAGNOSIS — E038 Other specified hypothyroidism: Secondary | ICD-10-CM | POA: Diagnosis present

## 2011-05-28 DIAGNOSIS — D693 Immune thrombocytopenic purpura: Secondary | ICD-10-CM | POA: Diagnosis present

## 2011-05-28 DIAGNOSIS — E871 Hypo-osmolality and hyponatremia: Secondary | ICD-10-CM | POA: Diagnosis present

## 2011-05-28 DIAGNOSIS — J438 Other emphysema: Secondary | ICD-10-CM | POA: Diagnosis present

## 2011-05-28 DIAGNOSIS — R599 Enlarged lymph nodes, unspecified: Principal | ICD-10-CM | POA: Diagnosis present

## 2011-05-28 DIAGNOSIS — I10 Essential (primary) hypertension: Secondary | ICD-10-CM | POA: Diagnosis present

## 2011-05-28 DIAGNOSIS — E46 Unspecified protein-calorie malnutrition: Secondary | ICD-10-CM | POA: Diagnosis present

## 2011-05-28 DIAGNOSIS — E785 Hyperlipidemia, unspecified: Secondary | ICD-10-CM | POA: Diagnosis present

## 2011-05-28 DIAGNOSIS — F17200 Nicotine dependence, unspecified, uncomplicated: Secondary | ICD-10-CM | POA: Diagnosis present

## 2011-05-28 DIAGNOSIS — R161 Splenomegaly, not elsewhere classified: Secondary | ICD-10-CM | POA: Diagnosis present

## 2011-05-28 DIAGNOSIS — K5900 Constipation, unspecified: Secondary | ICD-10-CM | POA: Diagnosis present

## 2011-05-28 LAB — DIFFERENTIAL
Basophils Absolute: 0.1 10*3/uL (ref 0.0–0.1)
Eosinophils Relative: 1 % (ref 0–5)
Lymphocytes Relative: 23 % (ref 12–46)
Lymphs Abs: 1.3 10*3/uL (ref 0.7–4.0)
Monocytes Relative: 10 % (ref 3–12)
Neutro Abs: 3.6 10*3/uL (ref 1.7–7.7)

## 2011-05-28 LAB — BASIC METABOLIC PANEL
CO2: 18 mEq/L — ABNORMAL LOW (ref 19–32)
Chloride: 98 mEq/L (ref 96–112)
Creatinine, Ser: 1.06 mg/dL (ref 0.50–1.10)
Glucose, Bld: 74 mg/dL (ref 70–99)
Sodium: 126 mEq/L — ABNORMAL LOW (ref 135–145)

## 2011-05-28 LAB — PRO B NATRIURETIC PEPTIDE: Pro B Natriuretic peptide (BNP): 3522 pg/mL — ABNORMAL HIGH (ref 0–125)

## 2011-05-28 LAB — POCT I-STAT 3, ART BLOOD GAS (G3+)
Bicarbonate: 19.5 mEq/L — ABNORMAL LOW (ref 20.0–24.0)
TCO2: 20 mmol/L (ref 0–100)
pH, Arterial: 7.423 — ABNORMAL HIGH (ref 7.350–7.400)
pO2, Arterial: 128 mmHg — ABNORMAL HIGH (ref 80.0–100.0)

## 2011-05-28 LAB — CBC
Hemoglobin: 10.1 g/dL — ABNORMAL LOW (ref 12.0–15.0)
MCH: 27.7 pg (ref 26.0–34.0)
MCV: 83 fL (ref 78.0–100.0)
Platelets: 61 10*3/uL — ABNORMAL LOW (ref 150–400)
RBC: 3.64 MIL/uL — ABNORMAL LOW (ref 3.87–5.11)
WBC: 5.7 10*3/uL (ref 4.0–10.5)

## 2011-05-28 LAB — CK: Total CK: 29 U/L (ref 7–177)

## 2011-05-28 LAB — D-DIMER, QUANTITATIVE: D-Dimer, Quant: 3.97 ug/mL-FEU — ABNORMAL HIGH (ref 0.00–0.48)

## 2011-05-28 LAB — POCT I-STAT TROPONIN I: Troponin i, poc: 0.02 ng/mL (ref 0.00–0.08)

## 2011-05-28 MED ORDER — IOHEXOL 350 MG/ML SOLN
100.0000 mL | Freq: Once | INTRAVENOUS | Status: AC | PRN
  Administered 2011-05-28: 100 mL via INTRAVENOUS

## 2011-05-29 ENCOUNTER — Inpatient Hospital Stay (HOSPITAL_COMMUNITY): Payer: BC Managed Care – PPO

## 2011-05-29 DIAGNOSIS — D696 Thrombocytopenia, unspecified: Secondary | ICD-10-CM

## 2011-05-29 DIAGNOSIS — R188 Other ascites: Secondary | ICD-10-CM

## 2011-05-29 DIAGNOSIS — R599 Enlarged lymph nodes, unspecified: Secondary | ICD-10-CM

## 2011-05-29 LAB — COMPREHENSIVE METABOLIC PANEL
ALT: <5 U/L (ref 0–35)
AST: 9 U/L (ref 0–37)
Alkaline Phosphatase: 74 U/L (ref 39–117)
CO2: 21 mEq/L (ref 19–32)
Chloride: 101 mEq/L (ref 96–112)
GFR calc non Af Amer: 58 mL/min — ABNORMAL LOW (ref >90)
Potassium: 4.5 mEq/L (ref 3.5–5.1)
Sodium: 129 mEq/L — ABNORMAL LOW (ref 135–145)
Total Bilirubin: 0.3 mg/dL (ref 0.3–1.2)

## 2011-05-29 LAB — URINALYSIS, ROUTINE W REFLEX MICROSCOPIC
Glucose, UA: NEGATIVE mg/dL
Ketones, ur: NEGATIVE mg/dL
Leukocytes, UA: NEGATIVE
Protein, ur: 30 mg/dL — AB

## 2011-05-29 LAB — PROTEIN / CREATININE RATIO, URINE
Protein Creatinine Ratio: 0.24 — ABNORMAL HIGH (ref 0.00–0.15)
Total Protein, Urine: 35.7 mg/dL

## 2011-05-29 LAB — CBC
Hemoglobin: 9.6 g/dL — ABNORMAL LOW (ref 12.0–15.0)
Platelets: 57 10*3/uL — ABNORMAL LOW (ref 150–400)
RBC: 3.45 MIL/uL — ABNORMAL LOW (ref 3.87–5.11)
WBC: 3.8 10*3/uL — ABNORMAL LOW (ref 4.0–10.5)

## 2011-05-29 LAB — DIC (DISSEMINATED INTRAVASCULAR COAGULATION)PANEL
D-Dimer, Quant: 4.79 ug/mL-FEU — ABNORMAL HIGH (ref 0.00–0.48)
Fibrinogen: 245 mg/dL (ref 204–475)
Platelets: 62 10*3/uL — ABNORMAL LOW (ref 150–400)
Prothrombin Time: 15.7 seconds — ABNORMAL HIGH (ref 11.6–15.2)

## 2011-05-30 ENCOUNTER — Inpatient Hospital Stay (HOSPITAL_COMMUNITY): Payer: BC Managed Care – PPO

## 2011-05-30 ENCOUNTER — Other Ambulatory Visit: Payer: Self-pay | Admitting: Interventional Radiology

## 2011-05-30 LAB — BODY FLUID CELL COUNT WITH DIFFERENTIAL
Lymphs, Fluid: 22 %
Neutrophil Count, Fluid: 1 % (ref 0–25)
Total Nucleated Cell Count, Fluid: 356 cu mm (ref 0–1000)

## 2011-05-30 LAB — BASIC METABOLIC PANEL
BUN: 13 mg/dL (ref 6–23)
CO2: 21 mEq/L (ref 19–32)
Glucose, Bld: 81 mg/dL (ref 70–99)
Potassium: 4.5 mEq/L (ref 3.5–5.1)
Sodium: 129 mEq/L — ABNORMAL LOW (ref 135–145)

## 2011-05-30 LAB — CBC
HCT: 27.6 % — ABNORMAL LOW (ref 36.0–46.0)
Hemoglobin: 9.3 g/dL — ABNORMAL LOW (ref 12.0–15.0)
MCH: 27.8 pg (ref 26.0–34.0)
RBC: 3.35 MIL/uL — ABNORMAL LOW (ref 3.87–5.11)

## 2011-05-30 MED ORDER — GADOBENATE DIMEGLUMINE 529 MG/ML IV SOLN: 15.0000 mL | Freq: Once | INTRAVENOUS | Status: DC

## 2011-05-31 DIAGNOSIS — R599 Enlarged lymph nodes, unspecified: Secondary | ICD-10-CM

## 2011-05-31 LAB — HIV ANTIBODY (ROUTINE TESTING W REFLEX): HIV: NONREACTIVE

## 2011-05-31 LAB — BASIC METABOLIC PANEL
BUN: 15 mg/dL (ref 6–23)
CO2: 21 mEq/L (ref 19–32)
Calcium: 7.6 mg/dL — ABNORMAL LOW (ref 8.4–10.5)
Creatinine, Ser: 1.05 mg/dL (ref 0.50–1.10)
Glucose, Bld: 79 mg/dL (ref 70–99)

## 2011-05-31 LAB — SAVE SMEAR

## 2011-05-31 LAB — PATHOLOGIST SMEAR REVIEW: Tech Review: REACTIVE

## 2011-06-01 LAB — PROTEIN, URINE, 24 HOUR
Collection Interval-UPROT: 24 hours
Protein, Urine: 26 mg/dL

## 2011-06-01 LAB — COMPREHENSIVE METABOLIC PANEL
Alkaline Phosphatase: 82 U/L (ref 39–117)
BUN: 15 mg/dL (ref 6–23)
GFR calc Af Amer: 61 mL/min — ABNORMAL LOW (ref >90)
Glucose, Bld: 81 mg/dL (ref 70–99)
Potassium: 4.9 mEq/L (ref 3.5–5.1)
Total Bilirubin: 0.3 mg/dL (ref 0.3–1.2)
Total Protein: 6.1 g/dL (ref 6.0–8.3)

## 2011-06-01 LAB — CBC
HCT: 28 % — ABNORMAL LOW (ref 36.0–46.0)
Hemoglobin: 9.4 g/dL — ABNORMAL LOW (ref 12.0–15.0)
MCHC: 33.6 g/dL (ref 30.0–36.0)
MCV: 83.1 fL (ref 78.0–100.0)

## 2011-06-01 LAB — CREATININE CLEARANCE, URINE, 24 HOUR
Creatinine: 1.16 mg/dL — ABNORMAL HIGH (ref 0.50–1.10)
Urine Total Volume-CRCL: 325 mL

## 2011-06-01 NOTE — H&P (Signed)
[name], FALOTICO               ACCOUNT NO.:  [id number]  MEDICAL RECORD NO.:  [id number]  LOCATION:  [facility]                         FACILITY:  [facility]  PHYSICIAN:  Tera Mater. [place], M.D. DATE OF BIRTH:  1955-12-26  DATE OF ADMISSION:  05/28/2011 DATE OF DISCHARGE:                             HISTORY & PHYSICAL   HISTORY OF PRESENT ILLNESS:  [name] is a 55 year old white female with a past history of adenopathy in several locations with some biopsies and no pathologic findings, primary hypothyroidism, prior idiopathic thrombocytopenic purpura, and a history of acute renal failure in the past.  She has been undergoing quite a bit of care recently.  She had a lymph node removed from her left axilla that was nondiagnostic and invasive radiology just did a percutaneous biopsy in the last 2 weeks in the groin node that was nondiagnostic.  She had been having some abdominal pain and underwent some CT scanning with the last check of that done back in September.  She has also had an increase in her thyroid medications.  She comes in today with progressive problems with her edema and now worsening shortness of breath.  The breathing trouble is a really new problem for her, she really has not had a lot of trouble with that before.  She does note the peripheral edema.  It has been bad.  They told her sodium was low and in fact in the records I do find that it has been as low as 127.  She has had a TSH, it was up at 13 and this was treated with two increases in her medications.  REVIEW OF SYSTEMS:  She is generally weak.  She has had some chills. She has some headaches.  Her vision is good.  Her bowels have been working a little too much.  The abdominal pain has now gone.  She notes no cardiac chest pain.  She has noted her weight is up.  She has had has quite a bit of trouble with this swelling.  PAST MEDICAL HISTORY:  Her past medical history is complicated.  She has had an episode of  ITP with acute renal failure back in 2006.  She had platelets as low as 17,000.  She has had this adenopathy, several times biopsied and noted.  Cervical adenopathy was noted on Dec 13, 2010.  A CT of the chest was done in May as well, which shows bilateral mild axillary and left and right axillary nodes with a 13-mm node that was biopsied.  CT abdomen and pelvis back in May shows liver, spleen, and pancreas are normal.  Abdominal aorta was fine.  There was retroperitoneal pelvic adenopathy as well.  A CT here just on April 28, 2011 now shows anasarca and splenomegaly and abdominal wall swelling.  She apparently has a cardiac history, at least in one place here.  She has had phobias, anxiety, tobacco use, hypertension, carpal tunnel syndrome, dyslipidemia.  PAST SURGICAL HISTORY:  Node biopsies.  She has had appendectomy, C- section x2, and a stent in 2007.  SOCIAL HISTORY:  She is married with 2 children.  She is a 1/2-pack a day smoker.  She is  a mild drinker.  FAMILY HISTORY:  Father died at 54 of heart disease.  Mother died at 53 of lung cancer.  She has 3 sisters and 2 brothers, one of whom died at 54 of heroin addiction.  She has a son and a daughter.  Diabetes is in the family as well.  MEDICATION LIST: 1. Levothyroxine 100. 2. Toprol 25 twice daily. 3. Xanax p.r.n. 4. Aspirin 81 daily. 5. Colace as necessary. 6. Over-the-counter Prilosec.  ALLERGIES:  We have listed has NIASPAN which causes flushing.  She has more of an intolerance than allergy.  LABORATORY DATA:  In the emergency room today, blood gas pH 7.423, PCO2 of 30, PO2 of 128, bicarb of 19.5.  White count is 5700, hemoglobin 10.1, platelets 61,000.  CK is normal at 29.  Chemistry reveals sodium 126, potassium 5.1, chloride 98, CO2 18, BUN 11, creatinine 1.06, estimated GFR 59, glucose is 74, calcium 7.9.  Point-of-care troponin was 0.02.  INR was 1.18.  Radiology testing here includes a 2 view chest.   Lungs are essentially clear.  HOSPITAL SUMMARY:  In summary, we have a complex 55 year old white female with several unresolved issues, presenting now with dyspnea and worsening edema.  She clearly has a whole-body problem with her lymphatic system.  Whether this is a fibrotic-type process such as retroperitoneal fibrosis or more diffuse fibrosis, I am not sure.  We have been unable to get a pathologic diagnosis.  The worsening along with anasarca probably is due to just lymphatic blockage.  This was discussed in detail with her.  She has had a lot of CTs but never an MRI, does not look like.  We are going to look at that and see if we can figure out what is going on with regard to what's happening.  She also seems to have a recurrent problem with her ITP.  Fortunately, she has no bleeding at the present time, and the platelets are only 61,000 and we need to watch for worsening there.  Certainly, we are going avoid steroids at the moment.  I am going to give her small dose of diuretics. Interestingly now, I am also seeing that she has evidence of probable left adrenal hemorrhage in the past, so the high potassium, low sodium,could have some adrenal insufficiency component.  Her blood pressure really does not fit with that and she denied ever getting weight loss. I am going to start with an MRI.  We are going to get the oncologist involved due to both the adenopathy and the ITP recurrence and see if they have any other issues.  We are going to watch carefully on her sodium.  We are going to give her some subcu Lovenox.  With a shortness of breath.  Her blood gas does not look too bad but has a little bit of respiratory alkalosis and metabolic acidosis.  So, I am not really sure what is going on there.  I am going to get a subacute Lovenox just as a prophylactic dose but check a D-dimer.  If that is up, we will do a full pulmonary embolism workup.  Her mild anemia may also be due to  this process as well.  Overall, this is a complex patient and with several issues to work out here.          ______________________________ Tera Mater. Evlyn Kanner, M.D.     SAS/MEDQ  D:  05/28/2011  T:  05/28/2011  Job:  [id number]  Electronically Signed by Jeannett Senior  Milagro Belmares M.D. on 06/01/2011 04:14:37 PM

## 2011-06-02 LAB — BASIC METABOLIC PANEL
GFR calc Af Amer: 64 mL/min — ABNORMAL LOW (ref >90)
GFR calc non Af Amer: 55 mL/min — ABNORMAL LOW (ref >90)
Potassium: 5 mEq/L (ref 3.5–5.1)
Sodium: 130 mEq/L — ABNORMAL LOW (ref 135–145)

## 2011-06-03 LAB — BODY FLUID CULTURE: Culture: NO GROWTH

## 2011-06-04 NOTE — Discharge Summary (Signed)
NAMEFREJA, FARO               ACCOUNT NO.:  [id number]  MEDICAL RECORD NO.:  [id number]  LOCATION:  5153                         FACILITY:  [facility]  PHYSICIAN:  Barry Dienes. Eloise Harman, M.D.DATE OF BIRTH:  Apr 26, 1956  DATE OF ADMISSION:  05/28/2011 DATE OF DISCHARGE:  06/02/2011                        DISCHARGE SUMMARY - REFERRING   PERTINENT FINDINGS:  The patient is a 55 year old Caucasian woman with a history of diffuse lymphadenopathy with several biopsies taken without evidence of malignancy, primary hypothyroidism, prior idiopathic thrombocytopenic purpura, and a history of acute renal failure.  This past year, she had an excisional biopsy of a left axilla lymph node, which was suggestive of Castleman's disease.  Approximately 2 weeks prior to hospital admission, she also had a biopsy of a lymph node that was nondiagnostic.  Over the past few weeks, she has had increasing abdominal pain and distention.  She presented to the emergency room with anasarca and dyspnea on exertion.  She has a history of mild hyponatremia presumably for medications and recently her thyroid medication was adjusted because her TSH was elevated.  She denied fever, chills, productive cough, nausea, or vomiting.  PAST MEDICAL HISTORY:  In 2006 ITP treated with Rituxan.  CT scan in 2012 showed diffuse retroperitoneal lymphadenopathy and pelvic lymphadenopathy.  CT scan of September, 2012 showed anasarca with splenomegaly and abdominal wall edema.  She has no history of congestive heart failure.  She also has had a history of anxiety, tobacco use that is ongoing, hypertension, carpal tunnel syndrome, and dyslipidemia.  MEDICATIONS:  At the time of hospitalization: 1. Levothyroxine 100 mcg p.o. daily. 2. Toprol-XL 25 mg twice daily. 3. Xanax p.r.n. 4. Aspirin 81 mg daily. 5. Colace as needed. 6. Over-the-counter Prilosec 20 mg daily.  PAST SURGICAL HISTORY:  Appendectomy, C-section x2, 2007,  stent procedure.  ALLERGIES:  NIASPAN has been associated with flushing.  SOCIAL HISTORY:  She is married with 2 children.  She has been smoking a half pack per day of cigarettes for many years, and she does not have a history of alcohol abuse.  FAMILY HISTORY:  Her father died at age 16 of heart disease.  Her mother died at age 11 years of lung cancer.  She has 3 sisters and 2 brothers, one of whom died at age 28 from heroin overdose.  She has a son and a daughter.  There is a family history of diabetes mellitus.  INITIAL PHYSICAL EXAMINATION:  VITAL SIGNS:  Stable and she was afebrile. CHEST:  Decreased breath sounds in the bases. HEART:  Regular rate and rhythm without significant murmur or gallop. ABDOMEN:  Moderate distention with normal bowel sounds and no tenderness.  EXTREMITIES:  Bilateral 2+ leg edema.  INITIAL LABORATORY STUDIES:  White blood cell count 5.7, hemoglobin 10, platelets 61.  Serum sodium 126, potassium 5.1, chloride 98, carbon dioxide 18, BUN 11, creatinine 1.06, glucose 74, calcium 7.9, INR was 1.18.  HOSPITAL COURSE:  The patient was admitted to a medical bed without telemetry.  She had several  procedures done including an MRI exam of the abdomen and pelvis without and with IV contrast that showed the following:  Small right pleural effusion with bibasilar atelectasis,  splenomegaly with 13.8 cm spleen, cholelithiasis, upper abdominal and retroperitoneal lymphadenopathy that was unchanged from a CT scan on April 28, 2011, including a 1.2 cm x 2.4 cm aortocaval lymph node, 2.2 cm x 2.5 cm left para-aortic lymph node, 1.8 x 1.3 cm left common iliac lymph node.  Abdominal ascites with periportal edema and anasarca. Colonic diverticulosis with stable pelvic lymphadenopathy including 2.0 x 1.0 cm right external iliac and deep inguinal lymph node and 1.2 cm x 2.2 cm left inguinal iliac lymph node with moderate pelvic ascites.  The anasarca was felt to be  increased and overall the lymph nodes were unchanged from the previous CT scan.  She also had an ultrasound-guided paracentesis done for diagnosis and therapy with 2.3 L of amber fluid withdrawn and cytology and cultures negative to date.  She had a bilateral decubitus x-ray study done which showed a small free flowing bilateral pleural effusions (right larger than left).  She had a CT angiography of the chest with IV contrast done that showed no evidence of pulmonary embolism, small right pleural effusion with associated passive atelectasis in the right lower lobe, stable bilateral axillary and subpectoral lymphadenopathy in comparison with May, 2012 CT scan, interval development of moderate amount of upper abdominal ascites, splenomegaly, and mild changes of emphysema.  She had a transthoracic echocardiogram done, which showed normal left ventricular size and systolic function with mild mitral regurgitation., trivial pericardial effusion with no evidence of tamponade physiology. The right ventricle was normal in size and systolic function.  COMPLICATIONS:  None.  CONDITION ON DISCHARGE:  Her breathing was much better after removal of greater than 2 L of abdominal fluid.  She was eating fairly well.  She had transient nausea that improved with resolution of constipation.  Her bowel movements had normalized.  Most recent physical exam showed vital signs of blood pressure 133/78, pulse 73, respirations 18, temperature 98.3, 96% pulse oxygen saturation on room air.  In general, she is a well-nourished, well-developed white woman who is in no apparent distress.  Head, eyes, ears, nose, throat exam was within normal limits. Chest was clear to auscultation.  Heart had a regular rate and rhythm with a systolic ejection murmur grade 1/6 at the left sternal border. Abdomen had mild distention with normal bowel sounds and no tenderness. Extremities had bilateral 1+ leg edema.  Orthostatics  vital signs were normal.  She was alert and well oriented and able to ambulate independently.  LABORATORY DATA:  Most recent and pertinent laboratory studies include the following:  ProBNP level was 3522.  D-dimer was 3.97.  TSH was 16. White blood cell count 4.8, hemoglobin 9.4, hematocrit 28, platelets 64. Serum sodium 129, potassium 4.9, chloride 98, carbon dioxide 22, BUN 15, creatinine 1.16, glucose 81, total protein 6.1, albumin 2.4, alkaline phosphatase 82, calcium 8.1.  HIV test was negative.  ANA test was positive.  A 24-hour urine collection had total urine protein of 85 with a creatinine clearance of 23 mL/minute.  A herpes virus type 8 DNA study was sent with results pending at the time of dictation.  DISCHARGE DIAGNOSES: 1. Multicentric Castleman's disease, status post Rituxan treatment in     the hospital. 2. Anemia. 3. Thrombocytopenia. 4. Anasarca. 5. Splenomegaly. 6. Hyponatremia. 7. Protein-calorie malnutrition. 8. Emphysema with ongoing tobacco use. 9. Hypothyroidism. 10.Constipation. 11.Anxiety. 12.Nausea.  DISCHARGE MEDICATIONS: 1. Phenergan 25 mg p.o. t.i.d. p.r.n. nausea. 2. Senokot-S 1 tab p.o. daily p.r.n. constipation. 3. Spironolactone 25 mg p.o. every Monday, Wednesday, and  Friday. 4. Lasix 40 mg p.o. every a.m. 5. Genteel over-the-counter ophthalmic drops 1 drop each eye q.a.m. 6. Levothyroxine 150 mcg p.o. daily. 7. Xanax 0.5 mg take 1/2 tab p.o. at bedtime p.r.n. anxiety or sleep.  DISPOSITION AND FOLLOW UP:  She should have a followup visit with Dr. Ivery Quale at [facility] within 1-2 weeks following discharge and should call [phone] to schedule that appointment.  She should also have a follow up visit with Dr. Gwenyth Bouillon at the [facility].  She should call his office to schedule a visit in 2-3 weeks following hospital discharge.  She should also check her body weights every  day and if her weight increases or decreases by 3 pounds that she should call Dr. Eloise Harman for recommendations.  Please note that the process of discharge took 40 minutes.          ______________________________ Barry Dienes Eloise Harman, M.D.     DGP/MEDQ  D:  06/04/2011  T:  06/04/2011  Job:  [id number]  Electronically Signed by Jarome Matin M.D. on 06/04/2011 05:33:23 PM

## 2011-06-09 ENCOUNTER — Other Ambulatory Visit (HOSPITAL_COMMUNITY): Payer: Self-pay | Admitting: Internal Medicine

## 2011-06-09 DIAGNOSIS — R601 Generalized edema: Secondary | ICD-10-CM

## 2011-06-10 ENCOUNTER — Ambulatory Visit (HOSPITAL_COMMUNITY)
Admission: RE | Admit: 2011-06-10 | Discharge: 2011-06-10 | Disposition: A | Discharge status: Home / Self Care | Payer: BC Managed Care – PPO | Source: Ambulatory Visit | Attending: Internal Medicine | Admitting: Internal Medicine

## 2011-06-10 DIAGNOSIS — R188 Other ascites: Secondary | ICD-10-CM | POA: Insufficient documentation

## 2011-06-10 DIAGNOSIS — R601 Generalized edema: Secondary | ICD-10-CM

## 2011-06-10 NOTE — Procedures (Signed)
US guided therapeutic paracentesis performed yielding 3 liters of blood-tinged fluid. No immediate complications.

## 2011-06-13 ENCOUNTER — Other Ambulatory Visit: Payer: Self-pay | Admitting: Internal Medicine

## 2011-06-13 ENCOUNTER — Telehealth: Payer: Self-pay | Admitting: Internal Medicine

## 2011-06-13 ENCOUNTER — Telehealth: Payer: Self-pay | Admitting: *Deleted

## 2011-06-13 ENCOUNTER — Other Ambulatory Visit: Payer: Self-pay | Admitting: *Deleted

## 2011-06-13 ENCOUNTER — Encounter: Payer: Self-pay | Admitting: *Deleted

## 2011-06-13 DIAGNOSIS — D47Z2 Castleman disease: Secondary | ICD-10-CM | POA: Insufficient documentation

## 2011-06-13 NOTE — Telephone Encounter (Signed)
Left voicemail, pt called back and is aware of appts.

## 2011-06-14 ENCOUNTER — Telehealth: Payer: Self-pay | Admitting: Internal Medicine

## 2011-06-14 NOTE — Telephone Encounter (Signed)
Pt aware of all appt starting 06/17/11

## 2011-06-15 ENCOUNTER — Other Ambulatory Visit: Payer: BC Managed Care – PPO | Admitting: Lab

## 2011-06-17 ENCOUNTER — Telehealth: Payer: Self-pay | Admitting: Internal Medicine

## 2011-06-17 ENCOUNTER — Other Ambulatory Visit: Payer: Self-pay | Admitting: Physician Assistant

## 2011-06-17 ENCOUNTER — Other Ambulatory Visit (HOSPITAL_BASED_OUTPATIENT_CLINIC_OR_DEPARTMENT_OTHER): Payer: BC Managed Care – PPO

## 2011-06-17 ENCOUNTER — Encounter: Payer: Self-pay | Admitting: Physician Assistant

## 2011-06-17 ENCOUNTER — Ambulatory Visit (HOSPITAL_BASED_OUTPATIENT_CLINIC_OR_DEPARTMENT_OTHER): Payer: BC Managed Care – PPO

## 2011-06-17 ENCOUNTER — Ambulatory Visit (HOSPITAL_BASED_OUTPATIENT_CLINIC_OR_DEPARTMENT_OTHER): Payer: BC Managed Care – PPO | Admitting: Physician Assistant

## 2011-06-17 ENCOUNTER — Other Ambulatory Visit: Payer: BC Managed Care – PPO | Admitting: Lab

## 2011-06-17 VITALS — BP 134/89 | HR 98 | Temp 97.6°F

## 2011-06-17 DIAGNOSIS — D47Z2 Castleman disease: Secondary | ICD-10-CM

## 2011-06-17 DIAGNOSIS — R599 Enlarged lymph nodes, unspecified: Secondary | ICD-10-CM

## 2011-06-17 DIAGNOSIS — D693 Immune thrombocytopenic purpura: Secondary | ICD-10-CM

## 2011-06-17 DIAGNOSIS — Z5112 Encounter for antineoplastic immunotherapy: Secondary | ICD-10-CM

## 2011-06-17 HISTORY — DX: Immune thrombocytopenic purpura: D69.3

## 2011-06-17 LAB — CBC WITH DIFFERENTIAL/PLATELET
BASO%: 0.7 % (ref 0.0–2.0)
Eosinophils Absolute: 0.1 10*3/uL (ref 0.0–0.5)
LYMPH%: 13.6 % — ABNORMAL LOW (ref 14.0–49.7)
MCHC: 33 g/dL (ref 31.5–36.0)
MONO#: 0.8 10*3/uL (ref 0.1–0.9)
NEUT#: 6.5 10*3/uL (ref 1.5–6.5)
Platelets: 92 10*3/uL — ABNORMAL LOW (ref 145–400)
RBC: 3.47 10*6/uL — ABNORMAL LOW (ref 3.70–5.45)
RDW: 16.1 % — ABNORMAL HIGH (ref 11.2–14.5)
WBC: 8.6 10*3/uL (ref 3.9–10.3)
nRBC: 0 % (ref 0–0)

## 2011-06-17 LAB — COMPREHENSIVE METABOLIC PANEL
ALT: <8 U/L (ref 0–35)
AST: 11 U/L (ref 0–37)
Calcium: 8.1 mg/dL — ABNORMAL LOW (ref 8.4–10.5)
Chloride: 101 mEq/L (ref 96–112)
Creatinine, Ser: 1.29 mg/dL — ABNORMAL HIGH (ref 0.50–1.10)
Sodium: 135 mEq/L (ref 135–145)
Total Bilirubin: 0.4 mg/dL (ref 0.3–1.2)
Total Protein: 6.4 g/dL (ref 6.0–8.3)

## 2011-06-17 MED ORDER — METHYLPREDNISOLONE SODIUM SUCC 125 MG IJ SOLR
125.0000 mg | Freq: Once | INTRAMUSCULAR | Status: AC | PRN
  Administered 2011-06-17: 125 mg via INTRAVENOUS

## 2011-06-17 MED ORDER — ACETAMINOPHEN 325 MG PO TABS
650.0000 mg | ORAL_TABLET | Freq: Once | ORAL | Status: AC
  Administered 2011-06-17: 650 mg via ORAL

## 2011-06-17 MED ORDER — SODIUM CHLORIDE 0.9 % IV SOLN
Freq: Once | INTRAVENOUS | Status: AC | PRN
  Administered 2011-06-17: 500 mL via INTRAVENOUS

## 2011-06-17 MED ORDER — SODIUM CHLORIDE 0.9 % IV SOLN
375.0000 mg/m2 | Freq: Once | INTRAVENOUS | Status: AC
  Administered 2011-06-17: 700 mg via INTRAVENOUS
  Filled 2011-06-17: qty 70

## 2011-06-17 MED ORDER — MEPERIDINE HCL 25 MG/ML IJ SOLN
12.5000 mg | Freq: Once | INTRAMUSCULAR | Status: AC
  Administered 2011-06-17: 12.5 mg via INTRAVENOUS

## 2011-06-17 MED ORDER — DIPHENHYDRAMINE HCL 50 MG/ML IJ SOLN
25.0000 mg | Freq: Once | INTRAMUSCULAR | Status: AC | PRN
  Administered 2011-06-17: 25 mg via INTRAVENOUS

## 2011-06-17 MED ORDER — SODIUM CHLORIDE 0.9 % IV SOLN
Freq: Once | INTRAVENOUS | Status: AC
  Administered 2011-06-17: 13:00:00 via INTRAVENOUS

## 2011-06-17 MED ORDER — DIPHENHYDRAMINE HCL 25 MG PO CAPS
50.0000 mg | ORAL_CAPSULE | Freq: Once | ORAL | Status: AC
Start: 2011-06-17 — End: 2011-06-17
  Administered 2011-06-17: 50 mg via ORAL

## 2011-06-17 NOTE — Progress Notes (Signed)
No images are attached to the encounter. No scans are attached to the encounter. No scans are attached to the encounter.  [facility] OFFICE PROGRESS NOTE  [name],[name], MD, MD [address] [facility] [place] [address]  DIAGNOSIS:  1. Castleman's Disease 2.Idiopathic Thrombocytopenic Purpura 3. 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  CURRENT THERAPY: Weekly rituximab at 375 mg per meter squared status post 1 cycle given in the hospital.  INTERVAL HISTORY: [name] 55 y.o. female returns for a scheduled regular office visit. She was discharged from the hospital all below her 2 weeks ago. She states that since discharge from the hospital she had a paracentesis with removal of approximately 3 L of fluid from her abdomen her appetite is improving slowly. Her bowels are moving regularly. Swelling of her feet and legs. Remains swollen but it is not tense or uncomfortable. She does have occasional episodes of diarrhea and occasional episodes of constipation. She manages both successfully with over-the-counter medications. Of note Dr. Daniel Patterson is the one is keeping an eye on her abdominal ascites.  MEDICAL HISTORY: Past Medical History  Diagnosis Date  . Hypothyroidism   . Heart attack 2007  . Chronic kidney disease     failure  . Heart attack 2007  . Retroperitoneal lymphadenopathy 2012  . Castleman's disease   . ITP (idiopathic thrombocytopenic purpura) 06/17/2011    ALLERGIES:  is allergic to cefdinir; levaquin; and vicodin.  MEDICATIONS:  Current Outpatient Prescriptions  Medication Sig Dispense Refill  . ALPRAZolam (XANAX) 0.25 MG tablet Take 0.25 mg by mouth at bedtime as needed.        . aspirin 81 MG tablet Take 81 mg by mouth daily.        . furosemide (LASIX) 40 MG tablet Take 40  mg by mouth 2 (two) times daily.        . levothyroxine (SYNTHROID, LEVOTHROID) 100 MCG tablet Take 150 mcg by mouth daily.       . spironolactone (ALDACTONE) 25 MG tablet Take 25 mg by mouth daily.        . metoprolol succinate (TOPROL-XL) 25 MG 24 hr tablet Take 25 mg by mouth daily.         Current Facility-Administered Medications  Medication Dose Route Frequency Provider Last Rate Last Dose  . meperidine (DEMEROL) injection 12.5 mg  12.5 mg Intravenous Once Kahley Leib E Pookela Sellin, PA       Facility-Administered Medications Ordered in Other Visits  Medication Dose Route Frequency Provider Last Rate Last Dose  . 0.9 %  sodium chloride infusion   Intravenous Once Mohamed K. Mohamed, MD 20 mL/hr at 06/17/11 1245    . acetaminophen (TYLENOL) tablet 650 mg  650 mg Oral Once Mohamed K. Mohamed, MD   650 mg at 06/17/11 1248  . diphenhydrAMINE (BENADRYL) capsule 50 mg  50 mg Oral Once Mohamed K. Mohamed, MD   50 mg at 06/17/11 1248  . riTUXimab (RITUXAN) 700 mg in sodium chloride 0.9 % 250 mL chemo infusion  375 mg/m2 (Treatment Plan Actual) Intravenous Once Mohamed K. Mohamed, MD 91 mL/hr at 06/17/11 1340 700 mg at 06/17/11 1340    SURGICAL HISTORY:  Past Surgical History  Procedure Date  . Cesarean section 1983 1989  . Appendectomy     55 years old    REVIEW OF SYSTEMS:  A comprehensive review of   systems was negative except for: Gastrointestinal: positive for constipation, diarrhea and Abdominal distention but not uncomfortable at this point. Musculoskeletal: positive for Lower extremity edema now decreased, no longer having the tight sensation behind the knees.   PHYSICAL EXAMINATION: General appearance: alert, cooperative and no distress Head: Normocephalic, without obvious abnormality, atraumatic Neck: no adenopathy, no carotid bruit, no JVD, supple, symmetrical, trachea midline and thyroid not enlarged, symmetric, no tenderness/mass/nodules Lymph nodes: Cervical, supraclavicular, and  axillary nodes normal. Resp: clear to auscultation bilaterally Cardio: regular rate and rhythm, S1, S2 normal, no murmur, click, rub or gallop GI: abnormal findings:  distended and No appreciable fluid wave Extremities: edema 2+ pitting edema bilateral lower extremities  ECOG PERFORMANCE STATUS: 1 - Symptomatic but completely ambulatory  Blood pressure 149/93, pulse 80, temperature 97.8 F (36.6 C), temperature source Oral, height 5' 4.5" (1.638 m), weight 64.728 kg (142 lb 11.2 oz).  LABORATORY DATA: Lab Results  Component Value Date   WBC 8.6 06/17/2011   HGB 9.3* 06/17/2011   HCT 28.2* 06/17/2011   MCV 81.3 06/17/2011   PLT 92 Large & giant platelets* 06/17/2011      Chemistry      Component Value Date/Time   NA 130* 06/02/2011 0720   NA 143 12/13/2010 0957   K 5.0 06/02/2011 0720   K 4.3 12/13/2010 0957   CL 100 06/02/2011 0720   CL 99 12/13/2010 0957   CO2 23 06/02/2011 0720   CO2 26 12/13/2010 0957   BUN 14 06/02/2011 0720   BUN 8 12/13/2010 0957   CREATININE 1.12* 06/02/2011 0720   CREATININE 1.16* 05/31/2011 1530   CREATININE 0.7 12/13/2010 0957      Component Value Date/Time   CALCIUM 7.6* 06/02/2011 0720   CALCIUM 8.9 12/13/2010 0957   ALKPHOS 82 06/01/2011 1229   ALKPHOS 94* 12/13/2010 0957   AST 11 06/01/2011 1229   AST 21 12/13/2010 0957   ALT <5 06/01/2011 1229   BILITOT 0.3 06/01/2011 1229   BILITOT 0.50 12/13/2010 0957       RADIOGRAPHIC STUDIES:  No results found.  ASSESSMENT/PLAN: Is a very pleasant 55-year-old white female with Castleman's disease and a history of ITP. The patient was discussed with Dr. Mohamed. She she will continue with weekly Rituxan treatment. She'll return in one week for a symptom management visit with a CBC differential and CMET She will followup with Dr. Patterson should she feel the need for another therapeutic paracentesis. Going forward we will plan to followup with her every 2 weeks and she continues her weekly Rituxan  treatment.     [name] E, PA-C     All questions were answered. The patient knows to call the clinic with any problems, questions or concerns. We can certainly see the patient much sooner if necessary.          

## 2011-06-17 NOTE — Progress Notes (Signed)
1155 OK to treat per Tiana Loft, PA with Platelets 92  1443 Pt. Up to bathroom and when she came back she c/o of "I just feel full in my stomach, this happens at home"  Pt. Face flushed and states also "I feel cold"  Rituxan stopped and NS wide open and Tiana Loft, PA notified.  1449 Benadryl 25mg  given IVP Pt. in rigors and blankets applied, NS continues wide open.  Tiana Loft, PA at chair-side.   1457 Demerol 12.5 given IVP.  Continue to monitor vitals, BP and pulse elevated.  1515 Pt. rigors are decreasing and states "I'm feeling better, still feel little full in my stomach"   1530 Continues to improve, pt. face not as flushed, no longer shaking with chills. 1545 Pt. Vitals continue to improve but Pulse staying 116-118. 1550 Hold re-challenge for now per Dr. Truett Perna per William Hamburger, RN 1630 Pt. Continues to improve, waiting to hear from MD.  1715 Will hold re-challenge and monitor pt. Until end of day per Tiana Loft, PA per Lebron Quam, RN

## 2011-06-17 NOTE — Patient Instructions (Signed)
1810 Pt. D/c with husband at side.  Instructed to call 911 if any adverse reactions at home.  Pt. And husband verbalized understanding.  Vitals stable.

## 2011-06-17 NOTE — Telephone Encounter (Signed)
gv pt appt schedule for nov/dec. AJ out of office on 11/30. Per AJ f/u 11/23.

## 2011-06-21 ENCOUNTER — Other Ambulatory Visit: Payer: Self-pay | Admitting: *Deleted

## 2011-06-21 ENCOUNTER — Other Ambulatory Visit: Payer: Self-pay | Admitting: Internal Medicine

## 2011-06-21 DIAGNOSIS — D693 Immune thrombocytopenic purpura: Secondary | ICD-10-CM

## 2011-06-21 DIAGNOSIS — D47Z2 Castleman disease: Secondary | ICD-10-CM | POA: Insufficient documentation

## 2011-06-21 MED ORDER — LIDOCAINE-PRILOCAINE 2.5-2.5 % EX CREA: TOPICAL_CREAM | CUTANEOUS | Status: DC | PRN

## 2011-06-21 MED ORDER — METHYLPREDNISOLONE SODIUM SUCC 125 MG IJ SOLR: 125.0000 mg | Freq: Once | INTRAMUSCULAR | Status: DC

## 2011-06-22 ENCOUNTER — Other Ambulatory Visit: Payer: Self-pay | Admitting: *Deleted

## 2011-06-22 ENCOUNTER — Other Ambulatory Visit: Payer: Self-pay | Admitting: Physician Assistant

## 2011-06-22 ENCOUNTER — Telehealth: Payer: Self-pay | Admitting: Internal Medicine

## 2011-06-22 DIAGNOSIS — D47Z2 Castleman disease: Secondary | ICD-10-CM

## 2011-06-22 NOTE — Progress Notes (Signed)
Due to pt having rituxan reaction and poor venous access (5 PIV sticks) on 11/6, per Dr Donnald Garre, pt will be moved to Wednesdays for tx and is scheduled for [facility] placement on 11/26 at 11:00am.  Pt is aware of port placement appt with IR, and is aware that tx will be moved from 11/23 to 11/28.  SLJ

## 2011-06-22 NOTE — Telephone Encounter (Signed)
appts scheduled per last order from desk nurse. appts for 11/30 and 12/7 cx'd. S/w pt re new appts for 11/28 and 12/5. Pt given appt for port placement by stephanie J. Port appt 11/26 @ 11 am @ [facility]. appt scheduled w/cathy @ [facility].

## 2011-06-24 ENCOUNTER — Ambulatory Visit: Payer: BC Managed Care – PPO

## 2011-06-24 ENCOUNTER — Other Ambulatory Visit: Payer: Self-pay | Admitting: Radiology

## 2011-06-24 ENCOUNTER — Other Ambulatory Visit: Payer: BC Managed Care – PPO | Admitting: Lab

## 2011-06-24 ENCOUNTER — Ambulatory Visit: Payer: BC Managed Care – PPO | Admitting: Physician Assistant

## 2011-06-27 ENCOUNTER — Other Ambulatory Visit: Payer: Self-pay | Admitting: Physician Assistant

## 2011-06-27 ENCOUNTER — Ambulatory Visit (HOSPITAL_COMMUNITY)
Admission: RE | Admit: 2011-06-27 | Discharge: 2011-06-27 | Disposition: A | Discharge status: Home / Self Care | Payer: BC Managed Care – PPO | Source: Ambulatory Visit | Attending: Physician Assistant | Admitting: Physician Assistant

## 2011-06-27 DIAGNOSIS — R599 Enlarged lymph nodes, unspecified: Secondary | ICD-10-CM | POA: Insufficient documentation

## 2011-06-27 DIAGNOSIS — D47Z2 Castleman disease: Secondary | ICD-10-CM

## 2011-06-27 DIAGNOSIS — Z79899 Other long term (current) drug therapy: Secondary | ICD-10-CM | POA: Insufficient documentation

## 2011-06-27 DIAGNOSIS — N189 Chronic kidney disease, unspecified: Secondary | ICD-10-CM | POA: Insufficient documentation

## 2011-06-27 DIAGNOSIS — E039 Hypothyroidism, unspecified: Secondary | ICD-10-CM | POA: Insufficient documentation

## 2011-06-27 DIAGNOSIS — I252 Old myocardial infarction: Secondary | ICD-10-CM | POA: Insufficient documentation

## 2011-06-27 LAB — CBC
Hemoglobin: 10.9 g/dL — ABNORMAL LOW (ref 12.0–15.0)
MCHC: 31.7 g/dL (ref 30.0–36.0)
Platelets: 88 10*3/uL — ABNORMAL LOW (ref 150–400)
RBC: 3.99 MIL/uL (ref 3.87–5.11)

## 2011-06-27 LAB — DIFFERENTIAL
Basophils Relative: 0 % (ref 0–1)
Eosinophils Absolute: 0.1 10*3/uL (ref 0.0–0.7)
Lymphs Abs: 1.1 10*3/uL (ref 0.7–4.0)
Monocytes Relative: 8 % (ref 3–12)
Neutro Abs: 3.3 10*3/uL (ref 1.7–7.7)
Neutrophils Relative %: 69 % (ref 43–77)

## 2011-06-27 LAB — PROTIME-INR
INR: 1.02 (ref 0.00–1.49)
Prothrombin Time: 13.6 seconds (ref 11.6–15.2)

## 2011-06-27 MED ORDER — FENTANYL CITRATE 0.05 MG/ML IJ SOLN
INTRAMUSCULAR | Status: AC | PRN
  Administered 2011-06-27: 50 ug via INTRAVENOUS
  Administered 2011-06-27: 100 ug via INTRAVENOUS

## 2011-06-27 MED ORDER — HEPARIN SODIUM (PORCINE) 1000 UNIT/ML IJ SOLN
INTRAMUSCULAR | Status: AC | PRN
  Administered 2011-06-27: 500 [IU]

## 2011-06-27 MED ORDER — MIDAZOLAM HCL 5 MG/5ML IJ SOLN
INTRAMUSCULAR | Status: AC | PRN
  Administered 2011-06-27: 2 mg via INTRAVENOUS

## 2011-06-27 MED ORDER — VANCOMYCIN HCL IN DEXTROSE 1-5 GM/200ML-% IV SOLN
1000.0000 mg | INTRAVENOUS | Status: AC
  Administered 2011-06-27: 1000 mg via INTRAVENOUS
  Filled 2011-06-27: qty 200

## 2011-06-27 MED ORDER — SODIUM CHLORIDE 0.9 % IV SOLN: INTRAVENOUS | Status: DC

## 2011-06-27 NOTE — H&P (View-Only) (Signed)
No images are attached to the encounter. No scans are attached to the encounter. No scans are attached to the encounter. [facility] OFFICE PROGRESS NOTE  [name], MD, MD [address] [facility] [place]. [place] [address]  DIAGNOSIS:  1. Castleman's Disease 2.Idiopathic Thrombocytopenic Purpura 3. 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  CURRENT THERAPY: Weekly rituximab at 375 mg per meter squared status post 1 cycle given in the hospital.  INTERVAL HISTORY: [name] 55 y.o. female returns for a scheduled regular office visit. She was discharged from the hospital all below her 2 weeks ago. She states that since discharge from the hospital she had a paracentesis with removal of approximately 3 L of fluid from her abdomen her appetite is improving slowly. Her bowels are moving regularly. Swelling of her feet and legs. Remains swollen but it is not tense or uncomfortable. She does have occasional episodes of diarrhea and occasional episodes of constipation. She manages both successfully with over-the-counter medications. Of note Dr. Ivery Quale is the one is keeping an eye on her abdominal ascites.  MEDICAL HISTORY: Past Medical History  Diagnosis Date  . Hypothyroidism   . Heart attack 2007  . Chronic kidney disease     failure  . Heart attack 2007  . Retroperitoneal lymphadenopathy 2012  . Castleman's disease   . ITP (idiopathic thrombocytopenic purpura) 06/17/2011    ALLERGIES:  is allergic to cefdinir; levaquin; and vicodin.  MEDICATIONS:  Current Outpatient Prescriptions  Medication Sig Dispense Refill  . ALPRAZolam (XANAX) 0.25 MG tablet Take 0.25 mg by mouth at bedtime as needed.        Marland Kitchen aspirin 81 MG tablet Take 81 mg by mouth daily.        . furosemide (LASIX) 40 MG tablet Take 40  mg by mouth 2 (two) times daily.        Marland Kitchen levothyroxine (SYNTHROID, LEVOTHROID) 100 MCG tablet Take 150 mcg by mouth daily.       Marland Kitchen spironolactone (ALDACTONE) 25 MG tablet Take 25 mg by mouth daily.        . metoprolol succinate (TOPROL-XL) 25 MG 24 hr tablet Take 25 mg by mouth daily.         Current Facility-Administered Medications  Medication Dose Route Frequency Provider Last Rate Last Dose  . meperidine (DEMEROL) injection 12.5 mg  12.5 mg Intravenous Once Conni Slipper, PA       Facility-Administered Medications Ordered in Other Visits  Medication Dose Route Frequency Provider Last Rate Last Dose  . 0.9 %  sodium chloride infusion   Intravenous Once Mohamed K. Mohamed, MD 20 mL/hr at 06/17/11 1245    . acetaminophen (TYLENOL) tablet 650 mg  650 mg Oral Once Mohamed K. Mohamed, MD   650 mg at 06/17/11 1248  . diphenhydrAMINE (BENADRYL) capsule 50 mg  50 mg Oral Once Mohamed K. Mohamed, MD   50 mg at 06/17/11 1248  . riTUXimab (RITUXAN) 700 mg in sodium chloride 0.9 % 250 mL chemo infusion  375 mg/m2 (Treatment Plan Actual) Intravenous Once Mohamed K. Mohamed, MD 91 mL/hr at 06/17/11 1340 700 mg at 06/17/11 1340    SURGICAL HISTORY:  Past Surgical History  Procedure Date  . Cesarean section 1983 1989  . Appendectomy     55 years old    REVIEW OF SYSTEMS:  A comprehensive review of  systems was negative except for: Gastrointestinal: positive for constipation, diarrhea and Abdominal distention but not uncomfortable at this point. Musculoskeletal: positive for Lower extremity edema now decreased, no longer having the tight sensation behind the knees.   PHYSICAL EXAMINATION: General appearance: alert, cooperative and no distress Head: Normocephalic, without obvious abnormality, atraumatic Neck: no adenopathy, no carotid bruit, no JVD, supple, symmetrical, trachea midline and thyroid not enlarged, symmetric, no tenderness/mass/nodules Lymph nodes: Cervical, supraclavicular, and  axillary nodes normal. Resp: clear to auscultation bilaterally Cardio: regular rate and rhythm, S1, S2 normal, no murmur, click, rub or gallop GI: abnormal findings:  distended and No appreciable fluid wave Extremities: edema 2+ pitting edema bilateral lower extremities  ECOG PERFORMANCE STATUS: 1 - Symptomatic but completely ambulatory  Blood pressure 149/93, pulse 80, temperature 97.8 F (36.6 C), temperature source Oral, height 5' 4.5" (1.638 m), weight 64.728 kg (142 lb 11.2 oz).  LABORATORY DATA: Lab Results  Component Value Date   WBC 8.6 06/17/2011   HGB 9.3* 06/17/2011   HCT 28.2* 06/17/2011   MCV 81.3 06/17/2011   PLT 92 Large & giant platelets* 06/17/2011      Chemistry      Component Value Date/Time   NA 130* 06/02/2011 0720   NA 143 12/13/2010 0957   K 5.0 06/02/2011 0720   K 4.3 12/13/2010 0957   CL 100 06/02/2011 0720   CL 99 12/13/2010 0957   CO2 23 06/02/2011 0720   CO2 26 12/13/2010 0957   BUN 14 06/02/2011 0720   BUN 8 12/13/2010 0957   CREATININE 1.12* 06/02/2011 0720   CREATININE 1.16* 05/31/2011 1530   CREATININE 0.7 12/13/2010 0957      Component Value Date/Time   CALCIUM 7.6* 06/02/2011 0720   CALCIUM 8.9 12/13/2010 0957   ALKPHOS 82 06/01/2011 1229   ALKPHOS 94* 12/13/2010 0957   AST 11 06/01/2011 1229   AST 21 12/13/2010 0957   ALT <5 06/01/2011 1229   BILITOT 0.3 06/01/2011 1229   BILITOT 0.50 12/13/2010 0957       RADIOGRAPHIC STUDIES:  No results found.  ASSESSMENT/PLAN: Is a very pleasant 55 year old white female with Castleman's disease and a history of ITP. The patient was discussed with Dr. Arbutus Ped. She she will continue with weekly Rituxan treatment. She'll return in one week for a symptom management visit with a CBC differential and CMET She will followup with Dr. Jarold Motto should she feel the need for another therapeutic paracentesis. Going forward we will plan to followup with her every 2 weeks and she continues her weekly Rituxan  treatment.     Conni Slipper, PA-C     All questions were answered. The patient knows to call the clinic with any problems, questions or concerns. We can certainly see the patient much sooner if necessary.

## 2011-06-27 NOTE — Interval H&P Note (Signed)
History and Physical Interval Note:  History reviewed in comparison to prior H& P from ED visit.    06/27/2011   11:34 AM   Tyrone Schimke  has presented today for surgery, with the diagnosis of *port a cath placement. *  The various methods of treatment have been discussed with the patient and family. After consideration of risks, benefits and other options for treatment, the patient has consented to percutaneous port a cath placement as a surgical intervention .  The patients' history has been reviewed, patient examined, no change in status, stable for surgery.  I have reviewed the patients' chart and labs.  Questions were answered to the patient's satisfaction.     CAMPBELL,PAMELA D, PA-C

## 2011-06-27 NOTE — H&P (Addendum)
[name] is an 55 y.o. female.   Chief Complaint: poor venous access in setting of Castlemans disease and ITP. HPI: see H&P from Dr. Shirline Frees PA.   Past Medical History  Diagnosis Date  . Hypothyroidism   . Heart attack 2007  . Chronic kidney disease     failure  . Heart attack 2007  . Retroperitoneal lymphadenopathy 2012  . Castleman's disease   . ITP (idiopathic thrombocytopenic purpura) 06/17/2011    Past Surgical History  Procedure Date  . Cesarean section 1983 1989  . Appendectomy     55 years old    Family History  Problem Relation Age of Onset  . Cancer Mother     lung  . Heart disease Father    Social History:  reports that she has been smoking.  She does not have any smokeless tobacco history on file. She reports that she drinks alcohol. She reports that she does not use illicit drugs.  Allergies:  Allergies  Allergen Reactions  . Cefdinir Diarrhea  . Levaquin Itching  . Vicodin (Hydrocodone-Acetaminophen) Itching    Medications Prior to Admission  Medication Sig Dispense Refill  . ALPRAZolam (XANAX) 0.25 MG tablet Take 0.25 mg by mouth at bedtime as needed.        Marland Kitchen aspirin 81 MG tablet Take 81 mg by mouth daily.        . furosemide (LASIX) 40 MG tablet Take 40 mg by mouth 2 (two) times daily.        Marland Kitchen levothyroxine (SYNTHROID, LEVOTHROID) 100 MCG tablet Take 150 mcg by mouth daily.       . metoprolol succinate (TOPROL-XL) 25 MG 24 hr tablet Take 25 mg by mouth daily.        Marland Kitchen spironolactone (ALDACTONE) 25 MG tablet Take 25 mg by mouth daily.         Medications Prior to Admission  Medication Dose Route Frequency Provider Last Rate Last Dose  . 0.9 %  sodium chloride infusion   Intravenous Continuous D Jeananne Rama, PA      . vancomycin (VANCOCIN) IVPB 1000 mg/200 mL premix  1,000 mg Intravenous to XRAY D Jeananne Rama, PA        Results for orders placed during the hospital encounter of 06/27/11 (from the past 48 hour(s))  CBC     Status: Abnormal  (Preliminary result)   Collection Time   06/27/11 11:15 AM      Component Value Range Comment   WBC 4.9  4.0 - 10.5 (K/uL)    RBC 3.99  3.87 - 5.11 (MIL/uL)    Hemoglobin 10.9 (*) 12.0 - 15.0 (g/dL)    HCT 95.6 (*) 21.3 - 46.0 (%)    MCV 86.2  78.0 - 100.0 (fL)    MCH 27.3  26.0 - 34.0 (pg)    MCHC 31.7  30.0 - 36.0 (g/dL)    RDW 08.6 (*) 57.8 - 15.5 (%)    Platelets PENDING  150 - 400 (K/uL)   DIFFERENTIAL     Status: Normal   Collection Time   06/27/11 11:15 AM      Component Value Range Comment   Neutrophils Relative 69  43 - 77 (%)    Neutro Abs 3.3  1.7 - 7.7 (K/uL)    Lymphocytes Relative 22  12 - 46 (%)    Lymphs Abs 1.1  0.7 - 4.0 (K/uL)    Monocytes Relative 8  3 - 12 (%)    Monocytes Absolute  0.4  0.1 - 1.0 (K/uL)    Eosinophils Relative 1  0 - 5 (%)    Eosinophils Absolute 0.1  0.0 - 0.7 (K/uL)    Basophils Relative 0  0 - 1 (%)    Basophils Absolute 0.0  0.0 - 0.1 (K/uL)   PROTIME-INR     Status: Normal   Collection Time   06/27/11 11:15 AM      Component Value Range Comment   Prothrombin Time 13.6  11.6 - 15.2 (seconds)    INR 1.02  0.00 - 1.49     No results found.  Review of Systems  Constitutional: Negative for fever and chills.  Eyes: Negative.   Respiratory: Negative.   Cardiovascular: Positive for leg swelling.  Gastrointestinal: Negative.   Skin: Negative.   Endo/Heme/Allergies: Bruises/bleeds easily.  Psychiatric/Behavioral: Negative.     There were no vitals taken for this visit. Physical Exam  Constitutional: She is oriented to person, place, and time. She appears well-developed and well-nourished. No distress.  HENT:  Head: Normocephalic and atraumatic.  Eyes: Pupils are equal, round, and reactive to light.  Cardiovascular: Normal rate, regular rhythm and normal heart sounds.  Exam reveals no gallop and no friction rub.   No murmur heard. Respiratory: Effort normal and breath sounds normal. No respiratory distress.  GI: Soft. Bowel  sounds are normal.  Musculoskeletal: Normal range of motion. She exhibits edema.  Neurological: She is alert and oriented to person, place, and time.  Skin: Skin is warm and dry.  Psychiatric: She has a normal mood and affect. Judgment normal.     Assessment/Plan Patient for port a cath placement once pending labs reviewed.   Veyda Kaufman D 06/27/2011, 11:38 AM

## 2011-06-27 NOTE — ED Notes (Signed)
Patient denies pain and is resting comfortably.  

## 2011-06-27 NOTE — ED Notes (Signed)
Patient is resting comfortably. 

## 2011-06-27 NOTE — Procedures (Signed)
Successful placement of right IJ approach port-a-cath with tip at superior aspect of the right atrium. The catheter is ready for immediate use. No immediate post procedural complications.  

## 2011-06-29 ENCOUNTER — Other Ambulatory Visit (HOSPITAL_BASED_OUTPATIENT_CLINIC_OR_DEPARTMENT_OTHER): Payer: BC Managed Care – PPO

## 2011-06-29 ENCOUNTER — Ambulatory Visit (HOSPITAL_BASED_OUTPATIENT_CLINIC_OR_DEPARTMENT_OTHER): Payer: BC Managed Care – PPO

## 2011-06-29 ENCOUNTER — Other Ambulatory Visit: Payer: Self-pay | Admitting: Internal Medicine

## 2011-06-29 VITALS — BP 166/107 | HR 107 | Temp 98.5°F

## 2011-06-29 DIAGNOSIS — Z5112 Encounter for antineoplastic immunotherapy: Secondary | ICD-10-CM

## 2011-06-29 DIAGNOSIS — D47Z2 Castleman disease: Secondary | ICD-10-CM

## 2011-06-29 DIAGNOSIS — D693 Immune thrombocytopenic purpura: Secondary | ICD-10-CM

## 2011-06-29 DIAGNOSIS — F419 Anxiety disorder, unspecified: Secondary | ICD-10-CM

## 2011-06-29 LAB — COMPREHENSIVE METABOLIC PANEL
ALT: <8 U/L (ref 0–35)
AST: 12 U/L (ref 0–37)
Albumin: 3.2 g/dL — ABNORMAL LOW (ref 3.5–5.2)
Alkaline Phosphatase: 79 U/L (ref 39–117)
BUN: 9 mg/dL (ref 6–23)
Creatinine, Ser: 0.83 mg/dL (ref 0.50–1.10)
Potassium: 3.9 mEq/L (ref 3.5–5.3)

## 2011-06-29 LAB — CBC WITH DIFFERENTIAL/PLATELET
BASO%: 0.4 % (ref 0.0–2.0)
EOS%: 0.8 % (ref 0.0–7.0)
HCT: 29.4 % — ABNORMAL LOW (ref 34.8–46.6)
MCH: 27 pg (ref 25.1–34.0)
MCHC: 32.7 g/dL (ref 31.5–36.0)
NEUT%: 73.3 % (ref 38.4–76.8)
RBC: 3.55 10*6/uL — ABNORMAL LOW (ref 3.70–5.45)
RDW: 17.5 % — ABNORMAL HIGH (ref 11.2–14.5)
WBC: 5.3 10*3/uL (ref 3.9–10.3)
lymph#: 0.9 10*3/uL (ref 0.9–3.3)
nRBC: 0 % (ref 0–0)

## 2011-06-29 MED ORDER — LORAZEPAM 2 MG/ML IJ SOLN
1.0000 mg | Freq: Once | INTRAMUSCULAR | Status: AC
  Administered 2011-06-29: 1 mg via INTRAVENOUS

## 2011-06-29 MED ORDER — ACETAMINOPHEN 325 MG PO TABS
650.0000 mg | ORAL_TABLET | Freq: Once | ORAL | Status: AC
  Administered 2011-06-29: 650 mg via ORAL

## 2011-06-29 MED ORDER — SODIUM CHLORIDE 0.9 % IV SOLN
375.0000 mg/m2 | Freq: Once | INTRAVENOUS | Status: AC
  Administered 2011-06-29: 700 mg via INTRAVENOUS
  Filled 2011-06-29: qty 70

## 2011-06-29 MED ORDER — SODIUM CHLORIDE 0.9 % IV SOLN: Freq: Once | INTRAVENOUS | Status: DC

## 2011-06-29 MED ORDER — DIPHENHYDRAMINE HCL 25 MG PO CAPS
50.0000 mg | ORAL_CAPSULE | Freq: Once | ORAL | Status: AC
  Administered 2011-06-29: 50 mg via ORAL

## 2011-06-29 MED ORDER — SODIUM CHLORIDE 0.9 % IJ SOLN
10.0000 mL | INTRAMUSCULAR | Status: DC | PRN
  Administered 2011-06-29: 10 mL
  Filled 2011-06-29: qty 10

## 2011-06-29 MED ORDER — METHYLPREDNISOLONE SODIUM SUCC 125 MG IJ SOLR
125.0000 mg | Freq: Once | INTRAMUSCULAR | Status: AC
  Administered 2011-06-29: 125 mg via INTRAVENOUS

## 2011-06-29 MED ORDER — HEPARIN SOD (PORK) LOCK FLUSH 100 UNIT/ML IV SOLN
500.0000 [IU] | Freq: Once | INTRAVENOUS | Status: AC | PRN
  Administered 2011-06-29: 500 [IU]
  Filled 2011-06-29: qty 5

## 2011-06-29 MED ORDER — LORAZEPAM 2 MG/ML IJ SOLN
0.5000 mg | Freq: Once | INTRAMUSCULAR | Status: DC
  Administered 2011-06-29: 0.5 mg via INTRAVENOUS

## 2011-06-29 NOTE — Progress Notes (Signed)
At 1740-- BP noted at 174/116.  Pt is asymptomatic, face slightly flushed but pt denies SOB, Chest pain or H/A.  Per Dr Donnald Garre, due to hypertension, okay to d/c rituxan at this time and will re challenge pt next Wednesday.  Informed nursing manager Gladis Riffle of plan, will ask schedulers to move pt's appt to earlier next Wednesday 12/5.  Pt is aware and verbalized understanding.    Approximately 170cc out of 350cc administered to patient.  SLJ

## 2011-06-29 NOTE — Progress Notes (Signed)
1000 Reported CBC to Dr. Arbutus Ped and OK to treat with Platelets 81, also reported BP 188/110.   1345 Notified Dr. Arbutus Ped of pt BP 175/102 P-95, pt asymptomatic.  Ordered to continue with treatment at this time.  1413 Pt states "feeling like I need oxygen" c/o of SOB, BP 161/114 P-123 and face flushed.  Dr. Arbutus Ped at chair side.  NS wide open O2 at 2L [place].    1510 Re-challenge started BP 124/79 P-91

## 2011-06-30 ENCOUNTER — Encounter: Payer: Self-pay | Admitting: Internal Medicine

## 2011-06-30 NOTE — Progress Notes (Signed)
Left message for pt that her appt next wed is at 0800 for labs followed by chemo

## 2011-07-01 ENCOUNTER — Other Ambulatory Visit: Payer: BC Managed Care – PPO | Admitting: Lab

## 2011-07-01 ENCOUNTER — Ambulatory Visit: Payer: BC Managed Care – PPO

## 2011-07-05 ENCOUNTER — Other Ambulatory Visit: Payer: Self-pay | Admitting: Internal Medicine

## 2011-07-05 DIAGNOSIS — D47Z2 Castleman disease: Secondary | ICD-10-CM

## 2011-07-05 MED ORDER — LORAZEPAM 2 MG/ML IJ SOLN: 1.0000 mg | Freq: Once | INTRAMUSCULAR | Status: DC

## 2011-07-05 MED ORDER — METHYLPREDNISOLONE SODIUM SUCC 125 MG IJ SOLR: 125.0000 mg | Freq: Once | INTRAMUSCULAR | Status: DC

## 2011-07-06 ENCOUNTER — Other Ambulatory Visit: Payer: BC Managed Care – PPO | Admitting: Lab

## 2011-07-06 ENCOUNTER — Ambulatory Visit: Payer: BC Managed Care – PPO

## 2011-07-06 ENCOUNTER — Other Ambulatory Visit (HOSPITAL_BASED_OUTPATIENT_CLINIC_OR_DEPARTMENT_OTHER): Payer: BC Managed Care – PPO | Admitting: Lab

## 2011-07-06 ENCOUNTER — Ambulatory Visit (HOSPITAL_BASED_OUTPATIENT_CLINIC_OR_DEPARTMENT_OTHER): Payer: BC Managed Care – PPO | Admitting: Physician Assistant

## 2011-07-06 ENCOUNTER — Encounter: Payer: Self-pay | Admitting: Physician Assistant

## 2011-07-06 ENCOUNTER — Ambulatory Visit (HOSPITAL_BASED_OUTPATIENT_CLINIC_OR_DEPARTMENT_OTHER): Payer: BC Managed Care – PPO

## 2011-07-06 VITALS — BP 153/88 | HR 80 | Temp 98.3°F

## 2011-07-06 DIAGNOSIS — Z5112 Encounter for antineoplastic immunotherapy: Secondary | ICD-10-CM

## 2011-07-06 DIAGNOSIS — I10 Essential (primary) hypertension: Secondary | ICD-10-CM

## 2011-07-06 DIAGNOSIS — R599 Enlarged lymph nodes, unspecified: Secondary | ICD-10-CM

## 2011-07-06 DIAGNOSIS — R143 Flatulence: Secondary | ICD-10-CM

## 2011-07-06 DIAGNOSIS — D693 Immune thrombocytopenic purpura: Secondary | ICD-10-CM

## 2011-07-06 DIAGNOSIS — R141 Gas pain: Secondary | ICD-10-CM

## 2011-07-06 DIAGNOSIS — D47Z2 Castleman disease: Secondary | ICD-10-CM

## 2011-07-06 LAB — CBC WITH DIFFERENTIAL/PLATELET
Basophils Absolute: 0 10*3/uL (ref 0.0–0.1)
Eosinophils Absolute: 0.1 10*3/uL (ref 0.0–0.5)
HCT: 32.5 % — ABNORMAL LOW (ref 34.8–46.6)
HGB: 10.6 g/dL — ABNORMAL LOW (ref 11.6–15.9)
MONO#: 0.4 10*3/uL (ref 0.1–0.9)
NEUT%: 60 % (ref 38.4–76.8)
WBC: 3.2 10*3/uL — ABNORMAL LOW (ref 3.9–10.3)
lymph#: 0.8 10*3/uL — ABNORMAL LOW (ref 0.9–3.3)

## 2011-07-06 LAB — COMPREHENSIVE METABOLIC PANEL
BUN: 6 mg/dL (ref 6–23)
CO2: 23 mEq/L (ref 19–32)
Calcium: 7.7 mg/dL — ABNORMAL LOW (ref 8.4–10.5)
Chloride: 97 mEq/L (ref 96–112)
Creatinine, Ser: 0.78 mg/dL (ref 0.50–1.10)

## 2011-07-06 MED ORDER — DIPHENHYDRAMINE HCL 25 MG PO CAPS
50.0000 mg | ORAL_CAPSULE | Freq: Once | ORAL | Status: AC
  Administered 2011-07-06: 50 mg via ORAL

## 2011-07-06 MED ORDER — HEPARIN SOD (PORK) LOCK FLUSH 100 UNIT/ML IV SOLN
500.0000 [IU] | Freq: Once | INTRAVENOUS | Status: AC | PRN
Start: 2011-07-06 — End: 2011-07-06
  Administered 2011-07-06: 500 [IU]
  Filled 2011-07-06: qty 5

## 2011-07-06 MED ORDER — MEPERIDINE HCL 25 MG/ML IJ SOLN
12.5000 mg | Freq: Once | INTRAMUSCULAR | Status: AC
  Administered 2011-07-06: 12.5 mg via INTRAVENOUS

## 2011-07-06 MED ORDER — METHYLPREDNISOLONE SODIUM SUCC 125 MG IJ SOLR
125.0000 mg | Freq: Once | INTRAMUSCULAR | Status: AC
  Administered 2011-07-06: 125 mg via INTRAVENOUS

## 2011-07-06 MED ORDER — ACETAMINOPHEN 325 MG PO TABS
650.0000 mg | ORAL_TABLET | Freq: Once | ORAL | Status: AC
  Administered 2011-07-06: 650 mg via ORAL

## 2011-07-06 MED ORDER — SODIUM CHLORIDE 0.9 % IV SOLN
375.0000 mg/m2 | Freq: Once | INTRAVENOUS | Status: AC
  Administered 2011-07-06: 700 mg via INTRAVENOUS
  Filled 2011-07-06: qty 70

## 2011-07-06 MED ORDER — SODIUM CHLORIDE 0.9 % IJ SOLN
10.0000 mL | INTRAMUSCULAR | Status: DC | PRN
  Administered 2011-07-06: 10 mL
  Filled 2011-07-06: qty 10

## 2011-07-06 MED ORDER — LORAZEPAM 2 MG/ML IJ SOLN
1.0000 mg | Freq: Once | INTRAMUSCULAR | Status: AC
  Administered 2011-07-06: 1 mg via INTRAVENOUS

## 2011-07-06 MED ORDER — SODIUM CHLORIDE 0.9 % IV SOLN
Freq: Once | INTRAVENOUS | Status: AC
  Administered 2011-07-06: 10:00:00 via INTRAVENOUS

## 2011-07-06 NOTE — Progress Notes (Signed)
No images are attached to the encounter. No scans are attached to the encounter. No scans are attached to the encounter. [facility] OFFICE PROGRESS NOTE  [name], MD, MD [address] [facility], [place]. [place] [address]  DIAGNOSIS:  1. Castleman's Disease 2.Idiopathic Thrombocytopenic Purpura 3. 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  CURRENT THERAPY: Weekly rituximab at 375 mg per meter squared status post 1 cycle given in the hospital.  INTERVAL HISTORY: [name] 55 y.o. female returns for a scheduled regular office visit. She reports she's had decreased edema in her lower extremities and her medications have been altered by Dr. Jarold Motto as follows: She is now taking Lasix 40 mg once daily, her stromal act on 1 tablet every other day, and approximately 5 days ago she was started on Toprol-XL 25 mg by mouth twice a day for her hypertension. She does continue to complain of abdominal distention with more difficulty finding closed a fit and in more firmer feeling to her abdomen. She notes feeling uncomfortable because of the distention. MEDICAL HISTORY: Past Medical History  Diagnosis Date  . Hypothyroidism   . Heart attack 2007  . Chronic kidney disease     failure  . Heart attack 2007  . Retroperitoneal lymphadenopathy 2012  . Castleman's disease   . ITP (idiopathic thrombocytopenic purpura) 06/17/2011    ALLERGIES:  is allergic to cefdinir; levaquin; and vicodin.  MEDICATIONS:  Current Outpatient Prescriptions  Medication Sig Dispense Refill  . ALPRAZolam (XANAX) 0.25 MG tablet Take 0.25 mg by mouth at bedtime as needed.        Marland Kitchen aspirin 81 MG tablet Take 81 mg by mouth daily.        . furosemide (LASIX) 40 MG tablet Take 40 mg by mouth daily.       Marland Kitchen levothyroxine (SYNTHROID, LEVOTHROID)  100 MCG tablet Take 150 mcg by mouth daily.       . metoprolol succinate (TOPROL-XL) 25 MG 24 hr tablet Take 25 mg by mouth daily.        Marland Kitchen spironolactone (ALDACTONE) 25 MG tablet Take 25 mg by mouth daily. Currently taking 1 tab by mouth every other day per Dr. Eloise Harman       Current Facility-Administered Medications  Medication Dose Route Frequency Provider Last Rate Last Dose  . meperidine (DEMEROL) injection 12.5 mg  12.5 mg Intravenous Once Conni Slipper, PA       Facility-Administered Medications Ordered in Other Visits  Medication Dose Route Frequency Provider Last Rate Last Dose  . 0.9 %  sodium chloride infusion   Intravenous Once Mohamed K. Mohamed, MD      . acetaminophen (TYLENOL) tablet 650 mg  650 mg Oral Once Mohamed K. Mohamed, MD   650 mg at 07/06/11 0907  . diphenhydrAMINE (BENADRYL) capsule 50 mg  50 mg Oral Once Mohamed K. Mohamed, MD   50 mg at 07/06/11 0907  . heparin lock flush 100 unit/mL  500 Units Intracatheter Once PRN Mohamed K. Mohamed, MD      . LORazepam (ATIVAN) injection 1 mg  1 mg Intravenous Once Mohamed K. Mohamed, MD   1 mg at 07/06/11 4098  . methylPREDNISolone sodium succinate (SOLU-MEDROL) 125 MG injection 125 mg  125 mg Intravenous Once Mohamed K. Mohamed, MD   125 mg at 07/06/11 0910  . riTUXimab (RITUXAN) 700 mg in sodium  chloride 0.9 % 250 mL chemo infusion  375 mg/m2 (Treatment Plan Actual) Intravenous Once Mohamed K. Mohamed, MD   700 mg at 07/06/11 0950  . sodium chloride 0.9 % injection 10 mL  10 mL Intracatheter PRN Mohamed K. Arbutus Ped, MD        SURGICAL HISTORY:  Past Surgical History  Procedure Date  . Cesarean section 1983 1989  . Appendectomy     55 years old    REVIEW OF SYSTEMS:  A comprehensive review of systems was negative except for: Gastrointestinal: positive for abdominal pain and Increased abdominal distention now having some discomfort as a result Musculoskeletal: positive for Decreased lower extremity edema   PHYSICAL  EXAMINATION: General appearance: alert, cooperative and no distress Head: Normocephalic, without obvious abnormality, atraumatic Neck: no adenopathy, no carotid bruit, no JVD, supple, symmetrical, trachea midline and thyroid not enlarged, symmetric, no tenderness/mass/nodules Lymph nodes: Cervical, supraclavicular, and axillary nodes normal. Resp: clear to auscultation bilaterally Cardio: regular rate and rhythm, S1, S2 normal, no murmur, click, rub or gallop GI: abnormal findings:  Increased abdominal distension and No appreciable fluid wave but more firm than previous exam Extremities: edema trace pitting edema bilateral lower extremities  ECOG PERFORMANCE STATUS: 1 - Symptomatic but completely ambulatory  There were no vitals taken for this visit.  LABORATORY DATA: Lab Results  Component Value Date   WBC 3.2* 07/06/2011   HGB 10.6* 07/06/2011   HCT 32.5* 07/06/2011   MCV 85.8 07/06/2011   PLT 84* 07/06/2011      Chemistry      Component Value Date/Time   NA 129* 06/29/2011 0913   NA 143 12/13/2010 0957   K 3.9 06/29/2011 0913   K 4.3 12/13/2010 0957   CL 97 06/29/2011 0913   CL 99 12/13/2010 0957   CO2 24 06/29/2011 0913   CO2 26 12/13/2010 0957   BUN 9 06/29/2011 0913   BUN 8 12/13/2010 0957   CREATININE 0.83 06/29/2011 0913   CREATININE 1.16* 05/31/2011 1530   CREATININE 0.7 12/13/2010 0957      Component Value Date/Time   CALCIUM 8.0* 06/29/2011 0913   CALCIUM 8.9 12/13/2010 0957   ALKPHOS 79 06/29/2011 0913   ALKPHOS 94* 12/13/2010 0957   AST 12 06/29/2011 0913   AST 21 12/13/2010 0957   ALT <8 06/29/2011 0913   BILITOT 0.3 06/29/2011 0913   BILITOT 0.50 12/13/2010 0957       RADIOGRAPHIC STUDIES:  No results found.  ASSESSMENT/PLAN: Is a very pleasant 55 year old white female with Castleman's disease and a history of ITP. The patient was discussed with Dr. Arbutus Ped. She she will continue with weekly Rituxan treatment. She'll return in two weeks for a symptom  management visit with a CBC differential and CMET and LDH. We will arrange an ultrasound-guided paracentesis and send fluid off for cytology. Hopefully this can be done later this week. We will continue her weekly rituximab as long as she tolerates it, unfortunately she's had some difficulty with some reaction varying in degree but usually manifesting itself with riders and elevated blood pressure. Upon further discussion with Dr. Arbutus Ped we may need to send Ms. Rud off to either [facility] or [place] for second opinion regarding other possible treatments for her Castleman's disease.    Conni Slipper, PA-C     All questions were answered. The patient knows to call the clinic with any problems, questions or concerns. We can certainly see the patient much sooner if necessary.

## 2011-07-06 NOTE — Patient Instructions (Addendum)
[facility] Discharge Instructions for Patients Receiving Chemotherapy  Today you received the following chemotherapy agents Rituxan  To help prevent nausea and vomiting after your treatment, we encourage you to take your nausea medication as prescribed by Dr. Arbutus Ped.  If you develop nausea and vomiting that is not controlled by your nausea medication, call the clinic. If it is after clinic hours your family physician or the after hours number for the clinic or go to the Emergency Department.   BELOW ARE SYMPTOMS THAT SHOULD BE REPORTED IMMEDIATELY:  *FEVER GREATER THAN 100.5 F  *CHILLS WITH OR WITHOUT FEVER  NAUSEA AND VOMITING THAT IS NOT CONTROLLED WITH YOUR NAUSEA MEDICATION  *UNUSUAL SHORTNESS OF BREATH  *UNUSUAL BRUISING OR BLEEDING  TENDERNESS IN MOUTH AND THROAT WITH OR WITHOUT PRESENCE OF ULCERS  *URINARY PROBLEMS  *BOWEL PROBLEMS  UNUSUAL RASH Items with * indicate a potential emergency and should be followed up as soon as possible.  Feel free to call the clinic you have any questions or concerns. The clinic phone number is [phone].   I have been informed and understand all the instructions given to me. I know to contact the clinic, my physician, or go to the Emergency Department if any problems should occur. I do not have any questions at this time, but understand that I may call the clinic during office hours   should I have any questions or need assistance in obtaining follow up care.    __________________________________________  _____________  __________ Signature of Patient or Authorized Representative            Date                   Time    __________________________________________ Nurse's Signature

## 2011-07-06 NOTE — Progress Notes (Signed)
V/s at 1535hr. Temp 98.4, Pulse 86 BP 152/80.Marland Kitchen Pt. Denied further symptoms. Rate maintained at 76ml/hr until end of infusion. HL

## 2011-07-06 NOTE — Progress Notes (Signed)
1145-Pt complaining of shakes and chills.  Rituxan rate at 200 mg/hr-91 ml/hr at time of reaction.  Rituxan stopped immediately and NS line added.  Dr. Arbutus Ped and Karrie Doffing PA notified and to infusion room to assess patient.  Order received to give Demerol 12.5mg . IV.  Demerol 12.5mg . IV given at 1150.  Shakes and chills ceased by 1200.  Order received per Dr. Arbutus Ped to restart Rituxan at 150 mg/hr when shakes and chills subside.  1230-Rituxan restarted at 150 mg/hr-68 ml.'s.  1300-Pt without complaints and tolerating Rituxan with no difficulties at this time.  Will continue to monitor VS every 30 minutes and keep Rituxan rate at 150 mg/hr-22ml./hr.

## 2011-07-06 NOTE — Progress Notes (Signed)
C/o'ed of headached, call Dr. Donnald Garre and okay to repeat tylenol 650mg . Pt. Stated that she is okay taking tylenol without any side effect.  Taken tylenol this am without issues. HL

## 2011-07-06 NOTE — Progress Notes (Signed)
rituxan total infusion appr. 5 hours due to reaction and unable to increase rate of infusion to prevent further reaction. HL

## 2011-07-07 ENCOUNTER — Telehealth: Payer: Self-pay | Admitting: *Deleted

## 2011-07-08 ENCOUNTER — Ambulatory Visit: Payer: BC Managed Care – PPO

## 2011-07-08 ENCOUNTER — Other Ambulatory Visit: Payer: BC Managed Care – PPO | Admitting: Lab

## 2011-07-11 ENCOUNTER — Telehealth: Payer: Self-pay | Admitting: Oncology

## 2011-07-11 NOTE — Telephone Encounter (Signed)
lmonvm advising the pt of her US paracentesis appt on 07/14/2011@2 :00pm

## 2011-07-12 ENCOUNTER — Other Ambulatory Visit: Payer: Self-pay | Admitting: Certified Registered Nurse Anesthetist

## 2011-07-12 LAB — AFB CULTURE WITH SMEAR (NOT AT ARMC)

## 2011-07-13 ENCOUNTER — Ambulatory Visit (HOSPITAL_BASED_OUTPATIENT_CLINIC_OR_DEPARTMENT_OTHER): Payer: BC Managed Care – PPO

## 2011-07-13 ENCOUNTER — Other Ambulatory Visit: Payer: Self-pay | Admitting: Internal Medicine

## 2011-07-13 ENCOUNTER — Other Ambulatory Visit (HOSPITAL_BASED_OUTPATIENT_CLINIC_OR_DEPARTMENT_OTHER): Payer: BC Managed Care – PPO | Admitting: Lab

## 2011-07-13 VITALS — BP 158/89 | HR 85 | Temp 97.9°F

## 2011-07-13 DIAGNOSIS — D47Z2 Castleman disease: Secondary | ICD-10-CM

## 2011-07-13 DIAGNOSIS — Z5112 Encounter for antineoplastic immunotherapy: Secondary | ICD-10-CM

## 2011-07-13 DIAGNOSIS — R599 Enlarged lymph nodes, unspecified: Secondary | ICD-10-CM

## 2011-07-13 DIAGNOSIS — D693 Immune thrombocytopenic purpura: Secondary | ICD-10-CM

## 2011-07-13 LAB — CBC WITH DIFFERENTIAL/PLATELET
Basophils Absolute: 0 10*3/uL (ref 0.0–0.1)
Eosinophils Absolute: 0.1 10*3/uL (ref 0.0–0.5)
HGB: 10.6 g/dL — ABNORMAL LOW (ref 11.6–15.9)
LYMPH%: 24.9 % (ref 14.0–49.7)
MCV: 87.4 fL (ref 79.5–101.0)
MONO#: 0.5 10*3/uL (ref 0.1–0.9)
MONO%: 10.4 % (ref 0.0–14.0)
NEUT#: 2.8 10*3/uL (ref 1.5–6.5)
Platelets: 98 10*3/uL — ABNORMAL LOW (ref 145–400)
RDW: 20.5 % — ABNORMAL HIGH (ref 11.2–14.5)
WBC: 4.6 10*3/uL (ref 3.9–10.3)

## 2011-07-13 LAB — COMPREHENSIVE METABOLIC PANEL
ALT: <8 U/L (ref 0–35)
CO2: 23 mEq/L (ref 19–32)
Calcium: 8.2 mg/dL — ABNORMAL LOW (ref 8.4–10.5)
Chloride: 99 mEq/L (ref 96–112)
Glucose, Bld: 84 mg/dL (ref 70–99)
Sodium: 129 mEq/L — ABNORMAL LOW (ref 135–145)
Total Bilirubin: 0.3 mg/dL (ref 0.3–1.2)
Total Protein: 6.5 g/dL (ref 6.0–8.3)

## 2011-07-13 MED ORDER — METHYLPREDNISOLONE SODIUM SUCC 125 MG IJ SOLR
125.0000 mg | Freq: Once | INTRAMUSCULAR | Status: AC
  Administered 2011-07-13: 125 mg via INTRAVENOUS

## 2011-07-13 MED ORDER — HEPARIN SOD (PORK) LOCK FLUSH 100 UNIT/ML IV SOLN
500.0000 [IU] | Freq: Once | INTRAVENOUS | Status: AC | PRN
  Administered 2011-07-13: 500 [IU]
  Filled 2011-07-13: qty 5

## 2011-07-13 MED ORDER — SODIUM CHLORIDE 0.9 % IV SOLN
375.0000 mg/m2 | Freq: Once | INTRAVENOUS | Status: AC
  Administered 2011-07-13: 700 mg via INTRAVENOUS
  Filled 2011-07-13: qty 70

## 2011-07-13 MED ORDER — ACETAMINOPHEN 325 MG PO TABS
650.0000 mg | ORAL_TABLET | Freq: Once | ORAL | Status: AC
  Administered 2011-07-13: 325 mg via ORAL

## 2011-07-13 MED ORDER — SODIUM CHLORIDE 0.9 % IJ SOLN
10.0000 mL | INTRAMUSCULAR | Status: DC | PRN
  Administered 2011-07-13: 10 mL
  Filled 2011-07-13: qty 10

## 2011-07-13 MED ORDER — LORAZEPAM 2 MG/ML IJ SOLN
1.0000 mg | Freq: Once | INTRAMUSCULAR | Status: AC
  Administered 2011-07-13: 1 mg via INTRAVENOUS

## 2011-07-13 MED ORDER — DIPHENHYDRAMINE HCL 25 MG PO CAPS
50.0000 mg | ORAL_CAPSULE | Freq: Once | ORAL | Status: AC
  Administered 2011-07-13: 25 mg via ORAL

## 2011-07-13 MED ORDER — SODIUM CHLORIDE 0.9 % IV SOLN
Freq: Once | INTRAVENOUS | Status: AC
  Administered 2011-07-13: 09:00:00 via INTRAVENOUS

## 2011-07-13 NOTE — Patient Instructions (Signed)
Patient aware of next appointment; medication for nausea and pain at home. 

## 2011-07-14 ENCOUNTER — Ambulatory Visit (HOSPITAL_COMMUNITY)
Admission: RE | Admit: 2011-07-14 | Discharge: 2011-07-14 | Disposition: A | Discharge status: Home / Self Care | Payer: BC Managed Care – PPO | Source: Ambulatory Visit | Attending: Physician Assistant | Admitting: Physician Assistant

## 2011-07-14 DIAGNOSIS — R599 Enlarged lymph nodes, unspecified: Secondary | ICD-10-CM | POA: Insufficient documentation

## 2011-07-14 DIAGNOSIS — R188 Other ascites: Secondary | ICD-10-CM | POA: Insufficient documentation

## 2011-07-14 DIAGNOSIS — D47Z2 Castleman disease: Secondary | ICD-10-CM

## 2011-07-14 NOTE — Procedures (Signed)
This is a pleasant 55 yo female with a history of recurrent ascites and Castleman's disease who was sent for an ultrasound guided therapeutic paracentesis.  Performed - Left mid abdominal quadrant  Yield - 1100 cc's of red / amber fluid  No complications   Patient tolerated well  No significant blood loss

## 2011-07-20 ENCOUNTER — Other Ambulatory Visit: Payer: Self-pay | Admitting: Internal Medicine

## 2011-07-20 ENCOUNTER — Ambulatory Visit (HOSPITAL_BASED_OUTPATIENT_CLINIC_OR_DEPARTMENT_OTHER): Payer: BC Managed Care – PPO | Admitting: Physician Assistant

## 2011-07-20 ENCOUNTER — Other Ambulatory Visit (HOSPITAL_BASED_OUTPATIENT_CLINIC_OR_DEPARTMENT_OTHER): Payer: BC Managed Care – PPO | Admitting: Lab

## 2011-07-20 ENCOUNTER — Ambulatory Visit (HOSPITAL_BASED_OUTPATIENT_CLINIC_OR_DEPARTMENT_OTHER): Payer: BC Managed Care – PPO

## 2011-07-20 VITALS — BP 169/90 | HR 76 | Temp 97.0°F

## 2011-07-20 DIAGNOSIS — D693 Immune thrombocytopenic purpura: Secondary | ICD-10-CM

## 2011-07-20 DIAGNOSIS — Z5112 Encounter for antineoplastic immunotherapy: Secondary | ICD-10-CM

## 2011-07-20 DIAGNOSIS — R599 Enlarged lymph nodes, unspecified: Secondary | ICD-10-CM

## 2011-07-20 DIAGNOSIS — I10 Essential (primary) hypertension: Secondary | ICD-10-CM

## 2011-07-20 DIAGNOSIS — D47Z2 Castleman disease: Secondary | ICD-10-CM

## 2011-07-20 LAB — CBC WITH DIFFERENTIAL/PLATELET
BASO%: 0.3 % (ref 0.0–2.0)
Basophils Absolute: 0 10*3/uL (ref 0.0–0.1)
EOS%: 1.9 % (ref 0.0–7.0)
HGB: 11.3 g/dL — ABNORMAL LOW (ref 11.6–15.9)
MCH: 30.7 pg (ref 25.1–34.0)
RBC: 3.68 10*6/uL — ABNORMAL LOW (ref 3.70–5.45)
RDW: 19.9 % — ABNORMAL HIGH (ref 11.2–14.5)
lymph#: 1.3 10*3/uL (ref 0.9–3.3)
nRBC: 3 % — ABNORMAL HIGH (ref 0–0)

## 2011-07-20 LAB — COMPREHENSIVE METABOLIC PANEL
ALT: <8 U/L (ref 0–35)
AST: 11 U/L (ref 0–37)
Albumin: 3.3 g/dL — ABNORMAL LOW (ref 3.5–5.2)
Alkaline Phosphatase: 61 U/L (ref 39–117)
Glucose, Bld: 87 mg/dL (ref 70–99)
Potassium: 4.2 mEq/L (ref 3.5–5.3)
Sodium: 135 mEq/L (ref 135–145)
Total Bilirubin: 0.3 mg/dL (ref 0.3–1.2)
Total Protein: 6.4 g/dL (ref 6.0–8.3)

## 2011-07-20 MED ORDER — METHYLPREDNISOLONE SODIUM SUCC 125 MG IJ SOLR
125.0000 mg | Freq: Once | INTRAMUSCULAR | Status: AC
  Administered 2011-07-20: 125 mg via INTRAVENOUS

## 2011-07-20 MED ORDER — SODIUM CHLORIDE 0.9 % IV SOLN
375.0000 mg/m2 | Freq: Once | INTRAVENOUS | Status: AC
  Administered 2011-07-20: 700 mg via INTRAVENOUS
  Filled 2011-07-20: qty 70

## 2011-07-20 MED ORDER — LORAZEPAM 2 MG/ML IJ SOLN
1.0000 mg | Freq: Once | INTRAMUSCULAR | Status: AC
  Administered 2011-07-20: 1 mg via INTRAVENOUS

## 2011-07-20 MED ORDER — CLONIDINE HCL 0.1 MG PO TABS
0.2000 mg | ORAL_TABLET | Freq: Once | ORAL | Status: AC
  Administered 2011-07-20: 0.2 mg via ORAL

## 2011-07-20 MED ORDER — ACETAMINOPHEN 325 MG PO TABS
650.0000 mg | ORAL_TABLET | Freq: Once | ORAL | Status: AC
  Administered 2011-07-20: 650 mg via ORAL

## 2011-07-20 MED ORDER — SODIUM CHLORIDE 0.9 % IV SOLN: Freq: Once | INTRAVENOUS | Status: DC

## 2011-07-20 MED ORDER — DIPHENHYDRAMINE HCL 25 MG PO CAPS
50.0000 mg | ORAL_CAPSULE | Freq: Once | ORAL | Status: AC
  Administered 2011-07-20: 50 mg via ORAL

## 2011-07-20 NOTE — Progress Notes (Signed)
Pt with elevated BP.  MD notified. Catapres given.  BP down slightly.  MD notified.  Orders to hold Rituxan.Dimas Alexandria, PA is contacting pt primary physician for further directions.

## 2011-07-20 NOTE — Progress Notes (Signed)
Pt BP decreased prior to discharge.  Notified Dr. Arbutus Ped.  Orders received to proceed with Rituxan.  Monitor BP. Stop Rituxan if BP elevates again.

## 2011-07-20 NOTE — Progress Notes (Signed)
Port flushed with saline 0.9% 10 ml and heparin 500 units heparin 100 units /ml .BP reviewed with Adrena -pt instructed to take BP meds per primary care provider . She was instructed to call 911 If she experiences any chest pain . Sob, numbness, weakness in extremities , confusion , severe headache-pt voices understanding.

## 2011-07-20 NOTE — Progress Notes (Signed)
Addended by: Charma Igo on: 07/20/2011 05:03 PM   Modules accepted: Kipp Brood

## 2011-07-21 NOTE — Progress Notes (Signed)
No images are attached to the encounter. No scans are attached to the encounter. No scans are attached to the encounter. [facility] OFFICE PROGRESS NOTE  [name], MD, MD [address] [facility], [place]. [place] [address]  DIAGNOSIS:  1. Castleman's Disease 2.Idiopathic Thrombocytopenic Purpura 3. 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  CURRENT THERAPY: Weekly rituximab at 375 mg per meter squared with first cycle given in the hospital.  INTERVAL HISTORY: [name] 55 y.o. female returns for a scheduled regular office visit. She reports she's had decreased edema in her lower extremities and her medications have been altered by Dr. Jarold Motto as follows: Her diuretics have been discontinued. She denied any chest pain shortness of breath blurred or double vision. She reports she had a paracentesis done last week with removal of 1 L of fluid. She still feels somewhat distended and feels that she may need another paracentesis soon. She presents for her next scheduled weekly cycle of Rituxan.   MEDICAL HISTORY: Past Medical History  Diagnosis Date  . Hypothyroidism   . Heart attack 2007  . Chronic kidney disease     failure  . Heart attack 2007  . Retroperitoneal lymphadenopathy 2012  . Castleman's disease   . ITP (idiopathic thrombocytopenic purpura) 06/17/2011    ALLERGIES:  is allergic to cefdinir; levaquin; and vicodin.  MEDICATIONS:  Current Outpatient Prescriptions  Medication Sig Dispense Refill  . ALPRAZolam (XANAX) 0.25 MG tablet Take 0.25 mg by mouth at bedtime as needed.        Marland Kitchen aspirin 81 MG tablet Take 81 mg by mouth daily.        . furosemide (LASIX) 40 MG tablet Take 40 mg by mouth daily.       Marland Kitchen levothyroxine (SYNTHROID, LEVOTHROID) 100 MCG tablet Take 150 mcg by mouth daily.         . metoprolol succinate (TOPROL-XL) 25 MG 24 hr tablet Take 25 mg by mouth daily.        Marland Kitchen spironolactone (ALDACTONE) 25 MG tablet Take 25 mg by mouth daily. Currently taking 1 tab by mouth every other day per Dr. Eloise Harman       No current facility-administered medications for this visit.   Facility-Administered Medications Ordered in Other Visits  Medication Dose Route Frequency Provider Last Rate Last Dose  . riTUXimab (RITUXAN) 700 mg in sodium chloride 0.9 % 250 mL chemo infusion  375 mg/m2 (Treatment Plan Actual) Intravenous Once Mohamed K. Mohamed, MD   700 mg at 07/20/11 1215  . sodium chloride 0.9 % injection 10 mL  10 mL Intracatheter PRN Mohamed K. Mohamed, MD   10 mL at 07/06/11 1605  . DISCONTD: 0.9 %  sodium chloride infusion   Intravenous Once Mohamed K. Arbutus Ped, MD        SURGICAL HISTORY:  Past Surgical History  Procedure Date  . Cesarean section 1983 1989  . Appendectomy     55 years old    REVIEW OF SYSTEMS:  A comprehensive review of systems was negative except for: Gastrointestinal: positive for Increased abdominal distention  Musculoskeletal: positive for Decreased lower extremity edema   PHYSICAL EXAMINATION: General appearance: alert, cooperative and no distress Head: Normocephalic, without obvious abnormality, atraumatic Neck: no adenopathy, no carotid bruit, no JVD, supple, symmetrical, trachea midline and thyroid not enlarged, symmetric, no tenderness/mass/nodules Lymph nodes: Cervical, supraclavicular, and axillary nodes  normal. Resp: clear to auscultation bilaterally Cardio: regular rate and rhythm, S1, S2 normal, no murmur, click, rub or gallop GI: abnormal findings:  Increased abdominal distension and No appreciable fluid wave but more firm than previous exam Extremities: edema trace pitting edema bilateral lower extremities  ECOG PERFORMANCE STATUS: 1 - Symptomatic but completely ambulatory  There were no vitals taken for this visit.  LABORATORY  DATA: Lab Results  Component Value Date   WBC 4.6 07/20/2011   HGB 11.3* 07/20/2011   HCT 32.9* 07/20/2011   MCV 89.5 07/20/2011   PLT 83* 07/20/2011      Chemistry      Component Value Date/Time   NA 135 07/20/2011 0743   NA 143 12/13/2010 0957   K 4.2 07/20/2011 0743   K 4.3 12/13/2010 0957   CL 105 07/20/2011 0743   CL 99 12/13/2010 0957   CO2 23 07/20/2011 0743   CO2 26 12/13/2010 0957   BUN 6 07/20/2011 0743   BUN 8 12/13/2010 0957   CREATININE 0.54 07/20/2011 0743   CREATININE 1.16* 05/31/2011 1530   CREATININE 0.7 12/13/2010 0957      Component Value Date/Time   CALCIUM 8.3* 07/20/2011 0743   CALCIUM 8.9 12/13/2010 0957   ALKPHOS 61 07/20/2011 0743   ALKPHOS 94* 12/13/2010 0957   AST 11 07/20/2011 0743   AST 21 12/13/2010 0957   ALT <8 07/20/2011 0743   BILITOT 0.3 07/20/2011 0743   BILITOT 0.50 12/13/2010 0957       RADIOGRAPHIC STUDIES:  No results found.  ASSESSMENT/PLAN: Is a very pleasant 55 year old white female with Castleman's disease and a history of ITP. The patient was discussed with Dr. Arbutus Ped. Initially when she presented her blood pressure is 199/103, she was given clonidine 0.2 mg tablet x1 dose. Her blood pressure decreased only slightly to 184/96. We contacted Dr. Silvano Rusk office regarding her blood pressure control. According to Dr. Norval Gable assistant Ms. Hakala requested to be taken off her diuretics although she was told that her blood pressure would likely increase as a result. Dr. Eloise Harman 18 Ms. Rathert to increase her metoprolol to 50 mg twice daily. The patient's blood pressure did come down to 146/88 and we were able to proceed with this week's scheduled Rituxan. She she will continue with weekly Rituxan treatment. She'll return in two weeks for a symptom management visit with a CBC differential and CMET and LDH. We will monitor the abdominal distention closely and arrange for therapeutic ultrasound-guided paracentesis as needed. We will  continue her weekly rituximab as long as she tolerates it, unfortunately she's had some difficulty with some reaction varying in degree but usually manifesting itself with rigors and elevated blood pressure. We may still need to send Ms. Louks off to either [facility] or [place] for second opinion regarding other possible treatments for her Castleman's disease.    Conni Slipper, PA-C     All questions were answered. The patient knows to call the clinic with any problems, questions or concerns. We can certainly see the patient much sooner if necessary.

## 2011-07-23 ENCOUNTER — Emergency Department (HOSPITAL_COMMUNITY)
Admission: EM | Admit: 2011-07-23 | Discharge: 2011-07-23 | Disposition: A | Discharge status: Home / Self Care | Payer: BC Managed Care – PPO | Attending: Emergency Medicine | Admitting: Emergency Medicine

## 2011-07-23 ENCOUNTER — Encounter (HOSPITAL_COMMUNITY): Payer: Self-pay

## 2011-07-23 ENCOUNTER — Other Ambulatory Visit: Payer: Self-pay

## 2011-07-23 DIAGNOSIS — R259 Unspecified abnormal involuntary movements: Secondary | ICD-10-CM | POA: Insufficient documentation

## 2011-07-23 DIAGNOSIS — E039 Hypothyroidism, unspecified: Secondary | ICD-10-CM | POA: Insufficient documentation

## 2011-07-23 DIAGNOSIS — R609 Edema, unspecified: Secondary | ICD-10-CM | POA: Insufficient documentation

## 2011-07-23 DIAGNOSIS — R232 Flushing: Secondary | ICD-10-CM | POA: Insufficient documentation

## 2011-07-23 DIAGNOSIS — R030 Elevated blood-pressure reading, without diagnosis of hypertension: Secondary | ICD-10-CM | POA: Insufficient documentation

## 2011-07-23 DIAGNOSIS — R12 Heartburn: Secondary | ICD-10-CM | POA: Insufficient documentation

## 2011-07-23 DIAGNOSIS — R51 Headache: Secondary | ICD-10-CM | POA: Insufficient documentation

## 2011-07-23 DIAGNOSIS — N19 Unspecified kidney failure: Secondary | ICD-10-CM | POA: Insufficient documentation

## 2011-07-23 DIAGNOSIS — Z79899 Other long term (current) drug therapy: Secondary | ICD-10-CM | POA: Insufficient documentation

## 2011-07-23 DIAGNOSIS — I10 Essential (primary) hypertension: Secondary | ICD-10-CM

## 2011-07-23 DIAGNOSIS — I252 Old myocardial infarction: Secondary | ICD-10-CM | POA: Insufficient documentation

## 2011-07-23 LAB — BASIC METABOLIC PANEL
BUN: 7 mg/dL (ref 6–23)
CO2: 23 mEq/L (ref 19–32)
Calcium: 8.7 mg/dL (ref 8.4–10.5)
Chloride: 103 mEq/L (ref 96–112)
Creatinine, Ser: 0.63 mg/dL (ref 0.50–1.10)
GFR calc Af Amer: >90 mL/min (ref >90)
GFR calc non Af Amer: >90 mL/min (ref >90)
Glucose, Bld: 77 mg/dL (ref 70–99)
Potassium: 4.2 mEq/L (ref 3.5–5.1)
Sodium: 134 mEq/L — ABNORMAL LOW (ref 135–145)

## 2011-07-23 LAB — CARDIAC PANEL(CRET KIN+CKTOT+MB+TROPI)
CK, MB: 1.6 ng/mL (ref 0.3–4.0)
Relative Index: INVALID (ref 0.0–2.5)
Total CK: 16 U/L (ref 7–177)
Troponin I: <0.3 ng/mL (ref <0.30)

## 2011-07-23 LAB — CBC
HCT: 33 % — ABNORMAL LOW (ref 36.0–46.0)
Hemoglobin: 11.1 g/dL — ABNORMAL LOW (ref 12.0–15.0)
MCH: 29.6 pg (ref 26.0–34.0)
MCHC: 33.6 g/dL (ref 30.0–36.0)
MCV: 88 fL (ref 78.0–100.0)
Platelets: 83 10*3/uL — ABNORMAL LOW (ref 150–400)
RBC: 3.75 MIL/uL — ABNORMAL LOW (ref 3.87–5.11)
RDW: 17.5 % — ABNORMAL HIGH (ref 11.5–15.5)
WBC: 5.7 10*3/uL (ref 4.0–10.5)

## 2011-07-23 MED ORDER — LOSARTAN POTASSIUM-HCTZ 100-12.5 MG PO TABS: 1.0000 | ORAL_TABLET | Freq: Every day | ORAL | Status: DC

## 2011-07-23 NOTE — ED Provider Notes (Signed)
History     CSN: [id number]  Arrival date & time 07/23/11  1052   First MD Initiated Contact with Patient 07/23/11 1224      Chief Complaint  Patient presents with  . Hypertension    (Consider location/radiation/quality/duration/timing/severity/associated sxs/prior treatment) HPI  Subjective:  [name] is a 55 y.o. female who presents to the emergency room with hypertension.  Pt states no Hx of HTN.  She was Dx with Multicentric Castleman's Disease in late October and began Rituximab infusions to treat the disorder.  Pt states she tolerated the first treatment without issue, but began having shakes and HTN causing her to be unable to complete the Tx.  Pt's PCP put her on 50mg  Metoprolol ER QD.  Pt states her oncologist then began giving her klonopin and prednisone with each administration which has helped.  Pt has remained hypertensive and her Metoprolol dose was increased to 100mg  QD on 07/21/11.  Pt awoke this morning c/o mild HA, flushing and heartburn.  PMHx MI 2007 with stent placement.  Pt is taking medications as instructed, home BP monitoring in range of 190's systolic over 110's diastolic, no chest pain on exertion, no dyspnea on exertion, mild swelling of ankles Tx and improved with diureti, no orthostatic dizziness or lightheadedness, no orthopnea or paroxysmal nocturnal dyspnea and occasional palpitations described as a flutter in her chest.   Past Medical History  Diagnosis Date  . Hypothyroidism   . Heart attack 2007  . Chronic kidney disease     failure  . Heart attack 2007  . Retroperitoneal lymphadenopathy 2012  . Castleman's disease   . ITP (idiopathic thrombocytopenic purpura) 06/17/2011    Past Surgical History  Procedure Date  . Cesarean section 1983 1989  . Appendectomy     55 years old    Family History  Problem Relation Age of Onset  . Cancer Mother     lung  . Heart disease Father     History  Substance Use Topics  . Smoking status:  Current Everyday Smoker -- 0.5 packs/day  . Smokeless tobacco: Not on file  . Alcohol Use: Yes      Review of Systems All pertinent positives and negatives in the history of present illness  Allergies  Cefdinir; Levaquin; and Vicodin  Home Medications   Current Outpatient Rx  Name Route Sig Dispense Refill  . ALPRAZOLAM 0.25 MG PO TABS Oral Take 0.25 mg by mouth at bedtime as needed. For sleep    . GENTEAL OP Ophthalmic Apply 1 drop to eye daily.      Marland Kitchen LEVOTHYROXINE SODIUM 150 MCG PO TABS Oral Take 150 mcg by mouth every morning.      Marland Kitchen METOPROLOL SUCCINATE ER 25 MG PO TB24 Oral Take 50 mg by mouth 2 (two) times daily.     Marland Kitchen RITUXAN IV Intravenous Inject 1 application into the vein once a week. Through port in chest on Wednesdays at [facility], [address] [place]       BP 168/88  Pulse 55  Temp(Src) 98.9 F (37.2 C) (Oral)  Resp 20  Ht 5\' 3"  (1.6 m)  Wt 126 lb (57.153 kg)  BMI 22.32 kg/m2  SpO2 99%  Physical Exam  Constitutional: She is oriented to person, place, and time. She appears well-developed and well-nourished. No distress.  HENT:  Head: Normocephalic and atraumatic.  Mouth/Throat: Oropharynx is clear and moist.  Eyes: Conjunctivae and EOM are normal. Pupils are equal, round, and reactive to light.  Neck: Normal range of motion.  Cardiovascular: Normal rate, regular rhythm, normal heart sounds and intact distal pulses.  PMI is not displaced.  Exam reveals no gallop and no friction rub.   No murmur heard. Pulmonary/Chest: Effort normal and breath sounds normal. No respiratory distress. She has no decreased breath sounds. She has no wheezes. She has no rhonchi. She has no rales. She exhibits no tenderness.  Abdominal: Soft. Bowel sounds are normal. She exhibits no distension and no mass. There is no hepatosplenomegaly. There is no tenderness. There is no rebound and no guarding.  Musculoskeletal: Normal range of motion.  Lymphadenopathy:    She has no  cervical adenopathy.  Neurological: She is alert and oriented to person, place, and time. She has normal strength and normal reflexes. No cranial nerve deficit or sensory deficit. She exhibits normal muscle tone. Coordination normal. GCS eye subscore is 4. GCS verbal subscore is 5. GCS motor subscore is 6.  Skin: Skin is warm and dry. She is not diaphoretic. Nails show no clubbing.     Psychiatric: She has a normal mood and affect. Her behavior is normal. Judgment and thought content normal.    ED Course  Procedures (including critical care time)  Patient is stable here in the emergency department.  Her blood pressure is elevated she is not showing  signs of endorgan damage   I spoke with Dr. Jarold Motto who is her primary care Dr.He advised to start her on Losartan 100/12.5 Told to follow up with him for a recheck.     MDM   Date: 07/23/2011  Rate: 61  Rhythm: normal sinus rhythm  QRS Axis: normal  Intervals: normal  ST/T Wave abnormalities: normal  Conduction Disutrbances:none  Narrative Interpretation:   Old EKG Reviewed: unchanged  Assessment:   Hypertension needs improvement and monitoring and needs to quit smoking.   Plan:  Continue to take PO BP meds as described.  Record your BPs in a journal noting the day, time and current activity.  F/U with PCP for continued HTN management.  Return to ER if symptoms persist or worsen.          Carlyle Dolly, PA-C 07/23/11 1555

## 2011-07-23 NOTE — ED Notes (Signed)
Headache, dizziness, facial flushing, developed during her [facility] Treatment for her Castleman's Disease

## 2011-07-24 NOTE — ED Provider Notes (Signed)
Medical screening examination/treatment/procedure(s) were performed by non-physician practitioner and as supervising physician I was immediately available for consultation/collaboration.  Alisandra Son T Valen Mascaro, MD 07/24/11 1446 

## 2011-07-25 ENCOUNTER — Other Ambulatory Visit: Payer: Self-pay | Admitting: Certified Registered Nurse Anesthetist

## 2011-07-28 ENCOUNTER — Other Ambulatory Visit (HOSPITAL_BASED_OUTPATIENT_CLINIC_OR_DEPARTMENT_OTHER): Payer: BC Managed Care – PPO | Admitting: Lab

## 2011-07-28 ENCOUNTER — Ambulatory Visit (HOSPITAL_BASED_OUTPATIENT_CLINIC_OR_DEPARTMENT_OTHER): Payer: BC Managed Care – PPO

## 2011-07-28 ENCOUNTER — Other Ambulatory Visit: Payer: Self-pay | Admitting: Internal Medicine

## 2011-07-28 VITALS — BP 154/101 | HR 58 | Temp 97.3°F

## 2011-07-28 DIAGNOSIS — Z5112 Encounter for antineoplastic immunotherapy: Secondary | ICD-10-CM

## 2011-07-28 DIAGNOSIS — D693 Immune thrombocytopenic purpura: Secondary | ICD-10-CM

## 2011-07-28 DIAGNOSIS — I10 Essential (primary) hypertension: Secondary | ICD-10-CM

## 2011-07-28 DIAGNOSIS — R599 Enlarged lymph nodes, unspecified: Secondary | ICD-10-CM

## 2011-07-28 DIAGNOSIS — D47Z2 Castleman disease: Secondary | ICD-10-CM

## 2011-07-28 LAB — CBC WITH DIFFERENTIAL/PLATELET
BASO%: 1.4 % (ref 0.0–2.0)
Basophils Absolute: 0.1 10*3/uL (ref 0.0–0.1)
HCT: 35.3 % (ref 34.8–46.6)
HGB: 11.9 g/dL (ref 11.6–15.9)
LYMPH%: 32.7 % (ref 14.0–49.7)
MCH: 29.4 pg (ref 25.1–34.0)
MCHC: 33.7 g/dL (ref 31.5–36.0)
MONO#: 0.7 10*3/uL (ref 0.1–0.9)
NEUT%: 48.5 % (ref 38.4–76.8)
Platelets: 110 10*3/uL — ABNORMAL LOW (ref 145–400)
WBC: 4.9 10*3/uL (ref 3.9–10.3)
lymph#: 1.6 10*3/uL (ref 0.9–3.3)

## 2011-07-28 LAB — COMPREHENSIVE METABOLIC PANEL
Albumin: 3.6 g/dL (ref 3.5–5.2)
BUN: 6 mg/dL (ref 6–23)
Calcium: 8.4 mg/dL (ref 8.4–10.5)
Chloride: 99 mEq/L (ref 96–112)
Creatinine, Ser: 0.59 mg/dL (ref 0.50–1.10)
Glucose, Bld: 78 mg/dL (ref 70–99)
Potassium: 4.1 mEq/L (ref 3.5–5.3)

## 2011-07-28 MED ORDER — METHYLPREDNISOLONE SODIUM SUCC 125 MG IJ SOLR
125.0000 mg | Freq: Once | INTRAMUSCULAR | Status: AC
  Administered 2011-07-28: 125 mg via INTRAVENOUS

## 2011-07-28 MED ORDER — HEPARIN SOD (PORK) LOCK FLUSH 100 UNIT/ML IV SOLN
500.0000 [IU] | Freq: Once | INTRAVENOUS | Status: AC | PRN
  Administered 2011-07-28: 500 [IU]
  Filled 2011-07-28: qty 5

## 2011-07-28 MED ORDER — SODIUM CHLORIDE 0.9 % IJ SOLN
10.0000 mL | INTRAMUSCULAR | Status: DC | PRN
  Administered 2011-07-28: 10 mL
  Filled 2011-07-28: qty 10

## 2011-07-28 MED ORDER — DIPHENHYDRAMINE HCL 25 MG PO CAPS
50.0000 mg | ORAL_CAPSULE | Freq: Once | ORAL | Status: AC
  Administered 2011-07-28: 50 mg via ORAL

## 2011-07-28 MED ORDER — CLONIDINE HCL 0.1 MG PO TABS
0.2000 mg | ORAL_TABLET | Freq: Once | ORAL | Status: AC
  Administered 2011-07-28: 0.2 mg via ORAL

## 2011-07-28 MED ORDER — SODIUM CHLORIDE 0.9 % IV SOLN
Freq: Once | INTRAVENOUS | Status: AC
  Administered 2011-07-28: 10:00:00 via INTRAVENOUS

## 2011-07-28 MED ORDER — LORAZEPAM 2 MG/ML IJ SOLN
1.0000 mg | Freq: Once | INTRAMUSCULAR | Status: AC
  Administered 2011-07-28: 1 mg via INTRAVENOUS

## 2011-07-28 MED ORDER — ACETAMINOPHEN 325 MG PO TABS
650.0000 mg | ORAL_TABLET | Freq: Once | ORAL | Status: AC
  Administered 2011-07-28: 650 mg via ORAL

## 2011-07-28 MED ORDER — SODIUM CHLORIDE 0.9 % IV SOLN
375.0000 mg/m2 | Freq: Once | INTRAVENOUS | Status: AC
  Administered 2011-07-28: 700 mg via INTRAVENOUS
  Filled 2011-07-28: qty 70

## 2011-07-29 ENCOUNTER — Telehealth: Payer: Self-pay | Admitting: *Deleted

## 2011-07-29 NOTE — Telephone Encounter (Signed)
Clinical Social Work received referral from [facility]- patient appeared overwhelmed at last visit. CSW called patient to assess for psychosocial needs and provide support. Ms. Pinson states that she feels she is handling her situation adequately and identifies her husband as a large support. The patient states she is focusing on "getting well" and "staying positive". CSW spent time explaining my role at the cancer center and pt plans to contact CSW if any needs should arise.  Kathrin Penner, MSW, [facility] Clinical Social Worker [facility] [phone]

## 2011-08-03 ENCOUNTER — Other Ambulatory Visit: Payer: Self-pay | Admitting: Internal Medicine

## 2011-08-04 ENCOUNTER — Other Ambulatory Visit (HOSPITAL_BASED_OUTPATIENT_CLINIC_OR_DEPARTMENT_OTHER): Payer: BC Managed Care – PPO | Admitting: Lab

## 2011-08-04 ENCOUNTER — Encounter: Payer: Self-pay | Admitting: Physician Assistant

## 2011-08-04 ENCOUNTER — Ambulatory Visit (HOSPITAL_BASED_OUTPATIENT_CLINIC_OR_DEPARTMENT_OTHER): Payer: BC Managed Care – PPO

## 2011-08-04 ENCOUNTER — Ambulatory Visit (HOSPITAL_BASED_OUTPATIENT_CLINIC_OR_DEPARTMENT_OTHER): Payer: BC Managed Care – PPO | Admitting: Physician Assistant

## 2011-08-04 VITALS — BP 151/82 | HR 71 | Temp 97.0°F

## 2011-08-04 DIAGNOSIS — D47Z2 Castleman disease: Secondary | ICD-10-CM

## 2011-08-04 DIAGNOSIS — Z862 Personal history of diseases of the blood and blood-forming organs and certain disorders involving the immune mechanism: Secondary | ICD-10-CM

## 2011-08-04 DIAGNOSIS — D693 Immune thrombocytopenic purpura: Secondary | ICD-10-CM

## 2011-08-04 DIAGNOSIS — R599 Enlarged lymph nodes, unspecified: Secondary | ICD-10-CM

## 2011-08-04 DIAGNOSIS — Z5112 Encounter for antineoplastic immunotherapy: Secondary | ICD-10-CM

## 2011-08-04 DIAGNOSIS — I10 Essential (primary) hypertension: Secondary | ICD-10-CM

## 2011-08-04 LAB — LACTATE DEHYDROGENASE: LDH: 105 U/L (ref 94–250)

## 2011-08-04 LAB — CBC WITH DIFFERENTIAL/PLATELET
Basophils Absolute: 0.1 10*3/uL (ref 0.0–0.1)
EOS%: 4.2 % (ref 0.0–7.0)
Eosinophils Absolute: 0.2 10*3/uL (ref 0.0–0.5)
HCT: 37.5 % (ref 34.8–46.6)
HGB: 12.5 g/dL (ref 11.6–15.9)
MONO#: 0.8 10*3/uL (ref 0.1–0.9)
NEUT#: 2.9 10*3/uL (ref 1.5–6.5)
NEUT%: 51.8 % (ref 38.4–76.8)
RDW: 16.5 % — ABNORMAL HIGH (ref 11.2–14.5)
WBC: 5.7 10*3/uL (ref 3.9–10.3)
lymph#: 1.6 10*3/uL (ref 0.9–3.3)

## 2011-08-04 LAB — COMPREHENSIVE METABOLIC PANEL
ALT: <8 U/L (ref 0–35)
BUN: 7 mg/dL (ref 6–23)
CO2: 23 mEq/L (ref 19–32)
Calcium: 8.8 mg/dL (ref 8.4–10.5)
Chloride: 101 mEq/L (ref 96–112)
Creatinine, Ser: 0.62 mg/dL (ref 0.50–1.10)
Glucose, Bld: 76 mg/dL (ref 70–99)
Total Bilirubin: 0.3 mg/dL (ref 0.3–1.2)

## 2011-08-04 MED ORDER — LORAZEPAM 2 MG/ML IJ SOLN
1.0000 mg | Freq: Once | INTRAMUSCULAR | Status: AC
  Administered 2011-08-04: 1 mg via INTRAVENOUS

## 2011-08-04 MED ORDER — METHYLPREDNISOLONE SODIUM SUCC 125 MG IJ SOLR
125.0000 mg | Freq: Once | INTRAMUSCULAR | Status: AC
  Administered 2011-08-04: 125 mg via INTRAVENOUS

## 2011-08-04 MED ORDER — HEPARIN SOD (PORK) LOCK FLUSH 100 UNIT/ML IV SOLN
500.0000 [IU] | Freq: Once | INTRAVENOUS | Status: AC | PRN
  Filled 2011-08-04: qty 5

## 2011-08-04 MED ORDER — SODIUM CHLORIDE 0.9 % IV SOLN
375.0000 mg/m2 | Freq: Once | INTRAVENOUS | Status: AC
  Administered 2011-08-04: 700 mg via INTRAVENOUS
  Filled 2011-08-04: qty 70

## 2011-08-04 MED ORDER — DIPHENHYDRAMINE HCL 25 MG PO CAPS
50.0000 mg | ORAL_CAPSULE | Freq: Once | ORAL | Status: AC
  Administered 2011-08-04: 50 mg via ORAL

## 2011-08-04 MED ORDER — SODIUM CHLORIDE 0.9 % IJ SOLN
10.0000 mL | INTRAMUSCULAR | Status: AC | PRN
  Filled 2011-08-04: qty 10

## 2011-08-04 MED ORDER — ACETAMINOPHEN 325 MG PO TABS
650.0000 mg | ORAL_TABLET | Freq: Once | ORAL | Status: AC
  Administered 2011-08-04: 650 mg via ORAL

## 2011-08-04 MED ORDER — SODIUM CHLORIDE 0.9 % IV SOLN
Freq: Once | INTRAVENOUS | Status: AC
  Administered 2011-08-04: 10:00:00 via INTRAVENOUS

## 2011-08-04 NOTE — Progress Notes (Signed)
No images are attached to the encounter. No scans are attached to the encounter. No scans are attached to the encounter. [facility] OFFICE PROGRESS NOTE  [name], MD, MD [address] [facility], [place]. [place] [address]  DIAGNOSIS:  1. Castleman's Disease 2.Idiopathic Thrombocytopenic Purpura 3. 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  CURRENT THERAPY: Weekly rituximab at 375 mg per meter squared with first cycle given in the hospital. Now status post 2 partial cycles and 4 complete cycles.  INTERVAL HISTORY: Suprena Travaglini 56 y.o. female returns for a scheduled regular office visit. She is tolerating her increased metoprolol they'll difficulty and it is controlling her blood pressure better. She notes some mild abdominal distention but is not uncomfortable enough to proceed with paracentesis at this time. Her lower extremity edema is significantly improved. Her bowels are moving regularly although she does have occasional bouts of constipation. She voiced no specific complaints today.    MEDICAL HISTORY: Past Medical History  Diagnosis Date  . Hypothyroidism   . Heart attack 2007  . Chronic kidney disease     failure  . Heart attack 2007  . Retroperitoneal lymphadenopathy 2012  . Castleman's disease   . ITP (idiopathic thrombocytopenic purpura) 06/17/2011    ALLERGIES:  is allergic to cefdinir; levaquin; and vicodin.  MEDICATIONS:  Current Outpatient Prescriptions  Medication Sig Dispense Refill  . ALPRAZolam (XANAX) 0.25 MG tablet Take 0.25 mg by mouth at bedtime as needed. For sleep      . Hypromellose (GENTEAL OP) Apply 1 drop to eye daily.        Marland Kitchen levothyroxine (SYNTHROID, LEVOTHROID) 150 MCG tablet Take 150 mcg by mouth every morning.        Marland Kitchen losartan-hydrochlorothiazide (HYZAAR) 100-12.5  MG per tablet Take 1 tablet by mouth daily.  30 tablet  0  . metoprolol succinate (TOPROL-XL) 25 MG 24 hr tablet Take 50 mg by mouth 2 (two) times daily.       . RiTUXimab (RITUXAN IV) Inject 1 application into the vein once a week. Through port in chest on Wednesdays at [facility], [address] [place]        No current facility-administered medications for this visit.   Facility-Administered Medications Ordered in Other Visits  Medication Dose Route Frequency Provider Last Rate Last Dose  . 0.9 %  sodium chloride infusion   Intravenous Once Mohamed K. Mohamed, MD 20 mL/hr at 08/04/11 [phone]    . acetaminophen (TYLENOL) tablet 650 mg  650 mg Oral Once Mohamed K. Mohamed, MD   650 mg at 08/04/11 0939  . diphenhydrAMINE (BENADRYL) capsule 50 mg  50 mg Oral Once Mohamed K. Mohamed, MD   50 mg at 08/04/11 0938  . heparin lock flush 100 unit/mL  500 Units Intracatheter Once PRN Mohamed K. Mohamed, MD      . LORazepam (ATIVAN) injection 1 mg  1 mg Intravenous Once Mohamed K. Mohamed, MD   1 mg at 08/04/11 0939  . methylPREDNISolone sodium succinate (SOLU-MEDROL) 125 MG injection 125 mg  125 mg Intravenous Once Mohamed K. Mohamed, MD   125 mg at 08/04/11 0946  . riTUXimab (RITUXAN) 700 mg in sodium chloride 0.9 % 250 mL chemo infusion  375 mg/m2 (Treatment Plan Actual) Intravenous Once Mohamed K. Mohamed, MD 23 mL/hr at 08/04/11 1030 700 mg at 08/04/11 1030  . sodium chloride 0.9 %  injection 10 mL  10 mL Intracatheter PRN Mohamed K. Mohamed, MD   10 mL at 07/06/11 1605  . sodium chloride 0.9 % injection 10 mL  10 mL Intracatheter PRN Mohamed K. Arbutus Ped, MD        SURGICAL HISTORY:  Past Surgical History  Procedure Date  . Cesarean section 1983 1989  . Appendectomy     56 years old    REVIEW OF SYSTEMS:  A comprehensive review of systems was negative except for: Gastrointestinal: positive for slightly increased abdominal distention  Musculoskeletal: positive for Decreased lower extremity  edema   PHYSICAL EXAMINATION: General appearance: alert, cooperative and no distress Head: Normocephalic, without obvious abnormality, atraumatic Neck: no adenopathy, no carotid bruit, no JVD, supple, symmetrical, trachea midline and thyroid not enlarged, symmetric, no tenderness/mass/nodules Lymph nodes: Cervical, supraclavicular, and axillary nodes normal. Resp: clear to auscultation bilaterally Cardio: regular rate and rhythm, S1, S2 normal, no murmur, click, rub or gallop GI: abnormal findings:  Increased abdominal distension and No appreciable fluid wave but more firm than previous exam Extremities: edema trace pitting edema bilateral lower extremities  ECOG PERFORMANCE STATUS: 1 - Symptomatic but completely ambulatory  There were no vitals taken for this visit.  LABORATORY DATA: Lab Results  Component Value Date   WBC 5.7 08/04/2011   HGB 12.5 08/04/2011   HCT 37.5 08/04/2011   MCV 89.1 08/04/2011   PLT 132* 08/04/2011      Chemistry      Component Value Date/Time   NA 130* 07/28/2011 0825   NA 143 12/13/2010 0957   K 4.1 07/28/2011 0825   K 4.3 12/13/2010 0957   CL 99 07/28/2011 0825   CL 99 12/13/2010 0957   CO2 22 07/28/2011 0825   CO2 26 12/13/2010 0957   BUN 6 07/28/2011 0825   BUN 8 12/13/2010 0957   CREATININE 0.59 07/28/2011 0825   CREATININE 1.16* 05/31/2011 1530   CREATININE 0.7 12/13/2010 0957      Component Value Date/Time   CALCIUM 8.4 07/28/2011 0825   CALCIUM 8.9 12/13/2010 0957   ALKPHOS 69 07/28/2011 0825   ALKPHOS 94* 12/13/2010 0957   AST 15 07/28/2011 0825   AST 21 12/13/2010 0957   ALT <8 07/28/2011 0825   BILITOT 0.3 07/28/2011 0825   BILITOT 0.50 12/13/2010 0957       RADIOGRAPHIC STUDIES:  No results found.  ASSESSMENT/PLAN: Is a very pleasant 56 year old white female with Castleman's disease and a history of ITP. The patient was discussed with Dr. Arbutus Ped. She will continue with her weekly Rituxan for a total of approximately 9 cycles. Will plan  to do a restaging CT after she completes the 9 cycles. She is to continue on her antihypertensive as per Dr. Eloise Harman. We will continue to monitor her abdominal distention and schedule a therapeutic ultrasound-guided paracentesis as needed. She'll be seen in 3 weeks for a symptom management visit with a repeat CBC differential C. met and LDH.   Conni Slipper, PA-C     All questions were answered. The patient knows to call the clinic with any problems, questions or concerns. We can certainly see the patient much sooner if necessary.

## 2011-08-05 ENCOUNTER — Other Ambulatory Visit: Payer: Self-pay | Admitting: Certified Registered Nurse Anesthetist

## 2011-08-05 ENCOUNTER — Telehealth: Payer: Self-pay | Admitting: Internal Medicine

## 2011-08-05 NOTE — Telephone Encounter (Signed)
l/m for pt for lab and rx on 1/11 and lab and adrena on 1/24,advised to call if any ?s    aom

## 2011-08-12 ENCOUNTER — Other Ambulatory Visit (HOSPITAL_BASED_OUTPATIENT_CLINIC_OR_DEPARTMENT_OTHER): Payer: BC Managed Care – PPO | Admitting: Lab

## 2011-08-12 ENCOUNTER — Ambulatory Visit (HOSPITAL_BASED_OUTPATIENT_CLINIC_OR_DEPARTMENT_OTHER): Payer: BC Managed Care – PPO

## 2011-08-12 ENCOUNTER — Other Ambulatory Visit: Payer: Self-pay | Admitting: Internal Medicine

## 2011-08-12 VITALS — BP 132/80 | HR 65 | Temp 97.2°F

## 2011-08-12 DIAGNOSIS — D47Z2 Castleman disease: Secondary | ICD-10-CM

## 2011-08-12 DIAGNOSIS — D693 Immune thrombocytopenic purpura: Secondary | ICD-10-CM

## 2011-08-12 DIAGNOSIS — R599 Enlarged lymph nodes, unspecified: Secondary | ICD-10-CM

## 2011-08-12 DIAGNOSIS — Z5112 Encounter for antineoplastic immunotherapy: Secondary | ICD-10-CM

## 2011-08-12 LAB — CBC WITH DIFFERENTIAL/PLATELET
Basophils Absolute: 0.1 10*3/uL (ref 0.0–0.1)
EOS%: 2.8 % (ref 0.0–7.0)
HCT: 36.6 % (ref 34.8–46.6)
HGB: 12.4 g/dL (ref 11.6–15.9)
MCH: 29.9 pg (ref 25.1–34.0)
MCV: 88.2 fL (ref 79.5–101.0)
NEUT%: 56.1 % (ref 38.4–76.8)
lymph#: 1.9 10*3/uL (ref 0.9–3.3)

## 2011-08-12 LAB — COMPREHENSIVE METABOLIC PANEL
Albumin: 4.2 g/dL (ref 3.5–5.2)
Alkaline Phosphatase: 80 U/L (ref 39–117)
BUN: 12 mg/dL (ref 6–23)
Calcium: 8.5 mg/dL (ref 8.4–10.5)
Glucose, Bld: 93 mg/dL (ref 70–99)
Potassium: 4.2 mEq/L (ref 3.5–5.3)

## 2011-08-12 MED ORDER — SODIUM CHLORIDE 0.9 % IV SOLN
375.0000 mg/m2 | Freq: Once | INTRAVENOUS | Status: AC
  Administered 2011-08-12: 600 mg via INTRAVENOUS
  Filled 2011-08-12: qty 60

## 2011-08-12 MED ORDER — LORAZEPAM 2 MG/ML IJ SOLN
1.0000 mg | Freq: Once | INTRAMUSCULAR | Status: AC
  Administered 2011-08-12: 1 mg via INTRAVENOUS

## 2011-08-12 MED ORDER — HEPARIN SOD (PORK) LOCK FLUSH 100 UNIT/ML IV SOLN
500.0000 [IU] | Freq: Once | INTRAVENOUS | Status: AC | PRN
  Administered 2011-08-12: 500 [IU]
  Filled 2011-08-12: qty 5

## 2011-08-12 MED ORDER — DIPHENHYDRAMINE HCL 25 MG PO CAPS
50.0000 mg | ORAL_CAPSULE | Freq: Once | ORAL | Status: AC
  Administered 2011-08-12: 50 mg via ORAL

## 2011-08-12 MED ORDER — ACETAMINOPHEN 325 MG PO TABS
650.0000 mg | ORAL_TABLET | Freq: Once | ORAL | Status: AC
  Administered 2011-08-12: 650 mg via ORAL

## 2011-08-12 MED ORDER — SODIUM CHLORIDE 0.9 % IJ SOLN
10.0000 mL | INTRAMUSCULAR | Status: DC | PRN
  Administered 2011-08-12: 10 mL
  Filled 2011-08-12: qty 10

## 2011-08-12 MED ORDER — SODIUM CHLORIDE 0.9 % IV SOLN
Freq: Once | INTRAVENOUS | Status: AC
  Administered 2011-08-12: 10:00:00 via INTRAVENOUS

## 2011-08-12 MED ORDER — METHYLPREDNISOLONE SODIUM SUCC 125 MG IJ SOLR
125.0000 mg | Freq: Once | INTRAMUSCULAR | Status: AC
  Administered 2011-08-12: 125 mg via INTRAVENOUS

## 2011-08-12 NOTE — Patient Instructions (Signed)
Pt to call with concerns. No chemo appts scheduled. Informed desk nurse who will advise MD.

## 2011-08-15 ENCOUNTER — Telehealth: Payer: Self-pay | Admitting: *Deleted

## 2011-08-15 ENCOUNTER — Other Ambulatory Visit: Payer: Self-pay | Admitting: Physician Assistant

## 2011-08-15 ENCOUNTER — Other Ambulatory Visit: Payer: Self-pay | Admitting: *Deleted

## 2011-08-15 NOTE — Telephone Encounter (Signed)
Pt wants to know if it is okay to get flu shot, per Dr Donnald Garre okay to get flu shot.  SLJ

## 2011-08-16 ENCOUNTER — Telehealth: Payer: Self-pay | Admitting: Internal Medicine

## 2011-08-16 NOTE — Telephone Encounter (Signed)
pt called with 1/18 lab and 1/25 tx,[place]  aom

## 2011-08-16 NOTE — Telephone Encounter (Signed)
Pt called and said her " husband read on internet that anyone on rituxan should not get flu vaccine". I called pt and left a message that  it is okay to take flu vaccine

## 2011-08-19 ENCOUNTER — Ambulatory Visit (HOSPITAL_BASED_OUTPATIENT_CLINIC_OR_DEPARTMENT_OTHER): Payer: BC Managed Care – PPO

## 2011-08-19 ENCOUNTER — Other Ambulatory Visit (HOSPITAL_BASED_OUTPATIENT_CLINIC_OR_DEPARTMENT_OTHER): Payer: BC Managed Care – PPO | Admitting: Lab

## 2011-08-19 ENCOUNTER — Other Ambulatory Visit: Payer: Self-pay | Admitting: Internal Medicine

## 2011-08-19 VITALS — BP 158/89 | HR 54 | Temp 97.6°F | Wt 116.5 lb

## 2011-08-19 DIAGNOSIS — R599 Enlarged lymph nodes, unspecified: Secondary | ICD-10-CM

## 2011-08-19 DIAGNOSIS — D47Z2 Castleman disease: Secondary | ICD-10-CM

## 2011-08-19 DIAGNOSIS — Z5112 Encounter for antineoplastic immunotherapy: Secondary | ICD-10-CM

## 2011-08-19 LAB — COMPREHENSIVE METABOLIC PANEL
AST: 14 U/L (ref 0–37)
Albumin: 4.4 g/dL (ref 3.5–5.2)
BUN: 13 mg/dL (ref 6–23)
Calcium: 8.8 mg/dL (ref 8.4–10.5)
Chloride: 102 mEq/L (ref 96–112)
Potassium: 4.4 mEq/L (ref 3.5–5.3)

## 2011-08-19 LAB — CBC WITH DIFFERENTIAL/PLATELET
BASO%: 1.4 % (ref 0.0–2.0)
Basophils Absolute: 0.1 10*3/uL (ref 0.0–0.1)
HCT: 38.4 % (ref 34.8–46.6)
HGB: 13.1 g/dL (ref 11.6–15.9)
MCHC: 34.1 g/dL (ref 31.5–36.0)
MONO#: 1.1 10*3/uL — ABNORMAL HIGH (ref 0.1–0.9)
NEUT#: 3.6 10*3/uL (ref 1.5–6.5)
NEUT%: 52.3 % (ref 38.4–76.8)
WBC: 6.9 10*3/uL (ref 3.9–10.3)
lymph#: 1.8 10*3/uL (ref 0.9–3.3)

## 2011-08-19 MED ORDER — LORAZEPAM 2 MG/ML IJ SOLN
1.0000 mg | Freq: Once | INTRAMUSCULAR | Status: AC
  Administered 2011-08-19: 1 mg via INTRAVENOUS

## 2011-08-19 MED ORDER — RITUXIMAB CHEMO INJECTION 10 MG/ML: 375.0000 mg/m2 | Freq: Once | INTRAVENOUS | Status: DC

## 2011-08-19 MED ORDER — HEPARIN SOD (PORK) LOCK FLUSH 100 UNIT/ML IV SOLN
500.0000 [IU] | Freq: Once | INTRAVENOUS | Status: AC | PRN
  Administered 2011-08-19: 500 [IU]
  Filled 2011-08-19: qty 5

## 2011-08-19 MED ORDER — DIPHENHYDRAMINE HCL 25 MG PO CAPS
50.0000 mg | ORAL_CAPSULE | Freq: Once | ORAL | Status: AC
  Administered 2011-08-19: 50 mg via ORAL

## 2011-08-19 MED ORDER — METHYLPREDNISOLONE SODIUM SUCC 125 MG IJ SOLR
125.0000 mg | Freq: Once | INTRAMUSCULAR | Status: AC
  Administered 2011-08-19: 125 mg via INTRAVENOUS

## 2011-08-19 MED ORDER — SODIUM CHLORIDE 0.9 % IV SOLN
Freq: Once | INTRAVENOUS | Status: AC
  Administered 2011-08-19: 100 mL via INTRAVENOUS

## 2011-08-19 MED ORDER — ACETAMINOPHEN 325 MG PO TABS
650.0000 mg | ORAL_TABLET | Freq: Once | ORAL | Status: AC
  Administered 2011-08-19: 650 mg via ORAL

## 2011-08-19 MED ORDER — SODIUM CHLORIDE 0.9 % IV SOLN
375.0000 mg/m2 | Freq: Once | INTRAVENOUS | Status: AC
  Administered 2011-08-19: 600 mg via INTRAVENOUS
  Filled 2011-08-19: qty 60

## 2011-08-19 MED ORDER — SODIUM CHLORIDE 0.9 % IJ SOLN
10.0000 mL | INTRAMUSCULAR | Status: DC | PRN
  Administered 2011-08-19: 10 mL
  Filled 2011-08-19: qty 10

## 2011-08-19 NOTE — Progress Notes (Signed)
Rituxan started at rate of 50 mg/hr = 26 ml/hr x 13 ml

## 2011-08-19 NOTE — Progress Notes (Signed)
VSS. Pt has no s/s of reaction.  Rituxan rate increased to 150 mg/hr= 79 ml/hr x 40.

## 2011-08-19 NOTE — Progress Notes (Signed)
VSS. Pt has no s/s of reaction.  Rate continued at 150 mg/hr 79 ml for remainder of infusion d/t pt's hx of reaction at higher rates.

## 2011-08-22 ENCOUNTER — Telehealth: Payer: Self-pay | Admitting: *Deleted

## 2011-08-22 NOTE — Telephone Encounter (Signed)
Pt called and confirmed appointments for Friday.

## 2011-08-24 ENCOUNTER — Other Ambulatory Visit: Payer: Self-pay | Admitting: Internal Medicine

## 2011-08-25 ENCOUNTER — Ambulatory Visit: Payer: BC Managed Care – PPO | Admitting: Physician Assistant

## 2011-08-25 ENCOUNTER — Other Ambulatory Visit: Payer: BC Managed Care – PPO | Admitting: Lab

## 2011-08-26 ENCOUNTER — Other Ambulatory Visit (HOSPITAL_BASED_OUTPATIENT_CLINIC_OR_DEPARTMENT_OTHER): Payer: BC Managed Care – PPO | Admitting: Lab

## 2011-08-26 ENCOUNTER — Encounter: Payer: Self-pay | Admitting: Physician Assistant

## 2011-08-26 ENCOUNTER — Ambulatory Visit: Payer: BC Managed Care – PPO

## 2011-08-26 ENCOUNTER — Ambulatory Visit (HOSPITAL_BASED_OUTPATIENT_CLINIC_OR_DEPARTMENT_OTHER): Payer: BC Managed Care – PPO

## 2011-08-26 ENCOUNTER — Ambulatory Visit (HOSPITAL_BASED_OUTPATIENT_CLINIC_OR_DEPARTMENT_OTHER): Payer: BC Managed Care – PPO | Admitting: Physician Assistant

## 2011-08-26 ENCOUNTER — Telehealth: Payer: Self-pay | Admitting: Internal Medicine

## 2011-08-26 VITALS — BP 166/88 | HR 66 | Temp 98.2°F

## 2011-08-26 DIAGNOSIS — Z5112 Encounter for antineoplastic immunotherapy: Secondary | ICD-10-CM

## 2011-08-26 DIAGNOSIS — R599 Enlarged lymph nodes, unspecified: Secondary | ICD-10-CM

## 2011-08-26 DIAGNOSIS — D47Z2 Castleman disease: Secondary | ICD-10-CM

## 2011-08-26 LAB — COMPREHENSIVE METABOLIC PANEL
ALT: 13 U/L (ref 0–35)
AST: 17 U/L (ref 0–37)
Albumin: 4.4 g/dL (ref 3.5–5.2)
Alkaline Phosphatase: 81 U/L (ref 39–117)
Calcium: 8.9 mg/dL (ref 8.4–10.5)
Chloride: 102 mEq/L (ref 96–112)
Potassium: 4.4 mEq/L (ref 3.5–5.3)

## 2011-08-26 LAB — CBC WITH DIFFERENTIAL/PLATELET
BASO%: 1.3 % (ref 0.0–2.0)
EOS%: 5.6 % (ref 0.0–7.0)
MCHC: 34.6 g/dL (ref 31.5–36.0)
MONO#: 1 10*3/uL — ABNORMAL HIGH (ref 0.1–0.9)
RBC: 4.68 10*6/uL (ref 3.70–5.45)
RDW: 14.4 % (ref 11.2–14.5)
WBC: 6.3 10*3/uL (ref 3.9–10.3)
lymph#: 1.7 10*3/uL (ref 0.9–3.3)
nRBC: 0 % (ref 0–0)

## 2011-08-26 MED ORDER — SODIUM CHLORIDE 0.9 % IV SOLN
Freq: Once | INTRAVENOUS | Status: AC
  Administered 2011-08-26: 08:00:00 via INTRAVENOUS

## 2011-08-26 MED ORDER — METHYLPREDNISOLONE SODIUM SUCC 125 MG IJ SOLR
125.0000 mg | Freq: Once | INTRAMUSCULAR | Status: AC
  Administered 2011-08-26: 125 mg via INTRAVENOUS

## 2011-08-26 MED ORDER — SODIUM CHLORIDE 0.9 % IV SOLN
375.0000 mg/m2 | Freq: Once | INTRAVENOUS | Status: AC
  Administered 2011-08-26: 600 mg via INTRAVENOUS
  Filled 2011-08-26: qty 60

## 2011-08-26 MED ORDER — HEPARIN SOD (PORK) LOCK FLUSH 100 UNIT/ML IV SOLN
500.0000 [IU] | Freq: Once | INTRAVENOUS | Status: AC | PRN
  Administered 2011-08-26: 500 [IU]
  Filled 2011-08-26: qty 5

## 2011-08-26 MED ORDER — SODIUM CHLORIDE 0.9 % IJ SOLN
10.0000 mL | INTRAMUSCULAR | Status: DC | PRN
Start: 2011-08-26 — End: 2011-08-26
  Administered 2011-08-26: 10 mL
  Filled 2011-08-26: qty 10

## 2011-08-26 MED ORDER — ACETAMINOPHEN 325 MG PO TABS
650.0000 mg | ORAL_TABLET | Freq: Once | ORAL | Status: AC
  Administered 2011-08-26: 650 mg via ORAL

## 2011-08-26 MED ORDER — DIPHENHYDRAMINE HCL 25 MG PO CAPS
50.0000 mg | ORAL_CAPSULE | Freq: Once | ORAL | Status: AC
  Administered 2011-08-26: 50 mg via ORAL

## 2011-08-26 MED ORDER — LORAZEPAM 2 MG/ML IJ SOLN
1.0000 mg | Freq: Once | INTRAMUSCULAR | Status: AC
  Administered 2011-08-26: 1 mg via INTRAVENOUS

## 2011-08-26 NOTE — Progress Notes (Signed)
No images are attached to the encounter. No scans are attached to the encounter. No scans are attached to the encounter. [facility] OFFICE PROGRESS NOTE  [name], MD, MD [address] [facility], [place]. [place] [address]  DIAGNOSIS:  1. Castleman's Disease 2.Idiopathic Thrombocytopenic Purpura 3. 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  CURRENT THERAPY: Weekly rituximab at 375 mg per meter squared with first cycle given in the hospital. Now status post 2 partial cycles and 7 complete cycles.  INTERVAL HISTORY: [name] 56 y.o. female returns for a scheduled regular office visit. Today she voices no specific complaints. She is feeling well overall and has good appetite. She's had no increased abdominal distention and has not needed a therapeutic paracentesis in approximately 2 months. Her blood pressure continues to fluctuate although she continues to tolerate the increase her Toprol dose without difficulty.     MEDICAL HISTORY: Past Medical History  Diagnosis Date  . Hypothyroidism   . Heart attack 2007  . Chronic kidney disease     failure  . Heart attack 2007  . Retroperitoneal lymphadenopathy 2012  . Castleman's disease   . ITP (idiopathic thrombocytopenic purpura) 06/17/2011    ALLERGIES:  is allergic to cefdinir; levaquin; and vicodin.  MEDICATIONS:  Current Outpatient Prescriptions  Medication Sig Dispense Refill  . ALPRAZolam (XANAX) 0.25 MG tablet Take 0.25 mg by mouth at bedtime as needed. For sleep      . Hypromellose (GENTEAL OP) Apply 1 drop to eye daily.        Marland Kitchen levothyroxine (SYNTHROID, LEVOTHROID) 150 MCG tablet Take 150 mcg by mouth every morning.        Marland Kitchen losartan-hydrochlorothiazide (HYZAAR) 100-12.5 MG per tablet Take 1 tablet by mouth daily.  30 tablet  0  . metoprolol  succinate (TOPROL-XL) 25 MG 24 hr tablet Take 50 mg by mouth 2 (two) times daily.       . RiTUXimab (RITUXAN IV) Inject 1 application into the vein once a week. Through port in chest on Wednesdays at [facility], [address] [place]        No current facility-administered medications for this visit.   Facility-Administered Medications Ordered in Other Visits  Medication Dose Route Frequency Provider Last Rate Last Dose  . 0.9 %  sodium chloride infusion   Intravenous Once Mohamed K. Mohamed, MD 20 mL/hr at 08/26/11 0825    . acetaminophen (TYLENOL) tablet 650 mg  650 mg Oral Once Mohamed K. Mohamed, MD   650 mg at 08/26/11 0836  . diphenhydrAMINE (BENADRYL) capsule 50 mg  50 mg Oral Once Mohamed K. Mohamed, MD   50 mg at 08/26/11 0836  . heparin lock flush 100 unit/mL  500 Units Intracatheter Once PRN Mohamed K. Mohamed, MD      . LORazepam (ATIVAN) injection 1 mg  1 mg Intravenous Once Mohamed K. Mohamed, MD   1 mg at 08/26/11 [phone]  . methylPREDNISolone sodium succinate (SOLU-MEDROL) 125 MG injection 125 mg  125 mg Intravenous Once Mohamed K. Mohamed, MD   125 mg at 08/26/11 0865  . riTUXimab (RITUXAN) 600 mg in sodium chloride 0.9 % 250 mL chemo infusion  375 mg/m2 (Treatment Plan Ideal) Intravenous Once Mohamed K. Mohamed, MD 26 mL/hr at 08/26/11 0905 600 mg at 08/26/11 0905  . sodium chloride 0.9 % injection 10 mL  10 mL Intracatheter PRN  Mohamed K. Mohamed, MD   10 mL at 07/06/11 1605  . sodium chloride 0.9 % injection 10 mL  10 mL Intracatheter PRN Mohamed K. Mohamed, MD      . sodium chloride 0.9 % injection 10 mL  10 mL Intracatheter PRN Mohamed K. Arbutus Ped, MD        SURGICAL HISTORY:  Past Surgical History  Procedure Date  . Cesarean section 1983 1989  . Appendectomy     56 years old    REVIEW OF SYSTEMS:  A comprehensive review of systems was negative.   PHYSICAL EXAMINATION: General appearance: alert, cooperative and no distress Head: Normocephalic, without obvious  abnormality, atraumatic Neck: no adenopathy, no carotid bruit, no JVD, supple, symmetrical, trachea midline and thyroid not enlarged, symmetric, no tenderness/mass/nodules Lymph nodes: Cervical, supraclavicular, and axillary nodes normal. Resp: clear to auscultation bilaterally Cardio: regular rate and rhythm, S1, S2 normal, no murmur, click, rub or gallop GI: Soft nontender nondistended no appreciable masses organomegaly  Extremities: edema trace pitting edema bilateral lower extremities  ECOG PERFORMANCE STATUS: 1 - Symptomatic but completely ambulatory  There were no vitals taken for this visit.  LABORATORY DATA: Lab Results  Component Value Date   WBC 6.3 08/26/2011   HGB 14.2 08/26/2011   HCT 41.0 08/26/2011   MCV 87.6 08/26/2011   PLT 145 08/26/2011      Chemistry      Component Value Date/Time   NA 132* 08/19/2011 0905   NA 143 12/13/2010 0957   K 4.4 08/19/2011 0905   K 4.3 12/13/2010 0957   CL 102 08/19/2011 0905   CL 99 12/13/2010 0957   CO2 20 08/19/2011 0905   CO2 26 12/13/2010 0957   BUN 13 08/19/2011 0905   BUN 8 12/13/2010 0957   CREATININE 0.64 08/19/2011 0905   CREATININE 1.16* 05/31/2011 1530   CREATININE 0.7 12/13/2010 0957      Component Value Date/Time   CALCIUM 8.8 08/19/2011 0905   CALCIUM 8.9 12/13/2010 0957   ALKPHOS 79 08/19/2011 0905   ALKPHOS 94* 12/13/2010 0957   AST 14 08/19/2011 0905   AST 21 12/13/2010 0957   ALT 11 08/19/2011 0905   BILITOT 0.3 08/19/2011 0905   BILITOT 0.50 12/13/2010 0957       RADIOGRAPHIC STUDIES:  No results found.  ASSESSMENT/PLAN: Is a very pleasant 56 year old white female with Castleman's disease and a history of ITP. The patient was discussed with Dr. Arbutus Ped. She'll followup with Dr. Arbutus Ped in 2 weeks with a repeat CBC differential C. met  and CT of the chest abdomen and pelvis with contrast to reevaluate her disease.  Conni Slipper, PA-C     All questions were answered. The patient knows to call the clinic with  any problems, questions or concerns. We can certainly see the patient much sooner if necessary.

## 2011-08-26 NOTE — Telephone Encounter (Signed)
l/m with 2/1 ct info and 2/7 md visit,advised to stop by and p/u contrast   aom

## 2011-09-01 ENCOUNTER — Telehealth: Payer: Self-pay | Admitting: Internal Medicine

## 2011-09-01 NOTE — Telephone Encounter (Signed)
Confirmed location of CT scan at [facility]

## 2011-09-02 ENCOUNTER — Ambulatory Visit (HOSPITAL_COMMUNITY)
Admission: RE | Admit: 2011-09-02 | Discharge: 2011-09-02 | Disposition: A | Discharge status: Home / Self Care | Payer: BC Managed Care – PPO | Source: Ambulatory Visit | Attending: Physician Assistant | Admitting: Physician Assistant

## 2011-09-02 DIAGNOSIS — R160 Hepatomegaly, not elsewhere classified: Secondary | ICD-10-CM | POA: Insufficient documentation

## 2011-09-02 DIAGNOSIS — R599 Enlarged lymph nodes, unspecified: Secondary | ICD-10-CM | POA: Insufficient documentation

## 2011-09-02 DIAGNOSIS — D47Z2 Castleman disease: Secondary | ICD-10-CM

## 2011-09-02 MED ORDER — IOHEXOL 300 MG/ML  SOLN
100.0000 mL | Freq: Once | INTRAMUSCULAR | Status: AC | PRN
  Administered 2011-09-02: 100 mL via INTRAVENOUS

## 2011-09-08 ENCOUNTER — Ambulatory Visit (HOSPITAL_BASED_OUTPATIENT_CLINIC_OR_DEPARTMENT_OTHER): Payer: BC Managed Care – PPO | Admitting: Internal Medicine

## 2011-09-08 ENCOUNTER — Telehealth: Payer: Self-pay | Admitting: Internal Medicine

## 2011-09-08 DIAGNOSIS — D47Z2 Castleman disease: Secondary | ICD-10-CM

## 2011-09-08 DIAGNOSIS — R599 Enlarged lymph nodes, unspecified: Secondary | ICD-10-CM

## 2011-09-08 DIAGNOSIS — Z09 Encounter for follow-up examination after completed treatment for conditions other than malignant neoplasm: Secondary | ICD-10-CM

## 2011-09-08 DIAGNOSIS — D693 Immune thrombocytopenic purpura: Secondary | ICD-10-CM

## 2011-09-08 NOTE — Telephone Encounter (Signed)
l/m with 4/8 appt and maile it out aom

## 2011-09-09 NOTE — Progress Notes (Signed)
[facility] OFFICE PROGRESS NOTE  Erin Fillers, MD, MD [address] [facility], [facility], [place]. [place] [address]  DIAGNOSIS:  1. Castleman's Disease  2.Idiopathic Thrombocytopenic Purpura  3. Lymphadenopathy consistent with angiofollicular lymphoid hyperplasia   PRIOR THERAPY: Status post treatment with Rituxan at 375 mg per meter squared last dose given 01/14/2005.   CURRENT THERAPY: Weekly rituximab at 375 mg per meter squared with first cycle given in the hospital. Now status post 2 partial cycles and 7 complete cycles.   INTERVAL HISTORY: [name] 56 y.o. female returns to the clinic today for followup visit. The patient tolerated the last 7 cycles of her treatment with Rituxan fairly well. She also has significant improvement in her general condition with less swelling in the neck area and no significant abdominal pain. She has repeat CT scan of the chest, abdomen and pelvis performed recently and she is here today for evaluation and discussion of her scan results.  MEDICAL HISTORY: Past Medical History  Diagnosis Date  . Hypothyroidism   . Heart attack 2007  . Chronic kidney disease     failure  . Heart attack 2007  . Retroperitoneal lymphadenopathy 2012  . Castleman's disease   . ITP (idiopathic thrombocytopenic purpura) 06/17/2011    ALLERGIES:  is allergic to cefdinir; levaquin; and vicodin.  MEDICATIONS:  Current Outpatient Prescriptions  Medication Sig Dispense Refill  . ALPRAZolam (XANAX) 0.25 MG tablet Take 0.25 mg by mouth at bedtime as needed. For sleep      . Hypromellose (GENTEAL OP) Apply 1 drop to eye daily.        Marland Kitchen levothyroxine (SYNTHROID, LEVOTHROID) 150 MCG tablet Take 150 mcg by mouth every morning.        Marland Kitchen losartan-hydrochlorothiazide (HYZAAR) 100-12.5 MG per tablet Take 1 tablet by mouth daily.  30 tablet  0  . metoprolol succinate (TOPROL-XL) 25 MG 24 hr tablet Take 50 mg by mouth 2 (two) times  daily.       . RiTUXimab (RITUXAN IV) Inject 1 application into the vein once a week. Through port in chest on Wednesdays at [facility], [address] [place]        No current facility-administered medications for this visit.   Facility-Administered Medications Ordered in Other Visits  Medication Dose Route Frequency Provider Last Rate Last Dose  . sodium chloride 0.9 % injection 10 mL  10 mL Intracatheter PRN Sosha Shepherd K. Cailey Trigueros, MD   10 mL at 07/06/11 1605  . sodium chloride 0.9 % injection 10 mL  10 mL Intracatheter PRN Nashley Cordoba K. Arbutus Ped, MD        SURGICAL HISTORY:  Past Surgical History  Procedure Date  . Cesarean section 1983 1989  . Appendectomy     56 years old    REVIEW OF SYSTEMS:  A comprehensive review of systems was negative.   PHYSICAL EXAMINATION: General appearance: alert, cooperative and no distress Neck: no adenopathy Lymph nodes: Cervical, supraclavicular, and axillary nodes normal. Resp: clear to auscultation bilaterally Cardio: regular rate and rhythm, S1, S2 normal, no murmur, click, rub or gallop GI: soft, non-tender; bowel sounds normal; no masses,  no organomegaly Extremities: extremities normal, atraumatic, no cyanosis or edema Neurologic: Alert and oriented X 3, normal strength and tone. Normal symmetric reflexes. Normal coordination and gait  ECOG PERFORMANCE STATUS: 0 - Asymptomatic  Blood pressure 147/81, pulse 56, temperature 96.9 F (36.1 C), temperature source Oral.  LABORATORY DATA: Lab Results  Component Value Date   WBC 6.3 08/26/2011   HGB 14.2 08/26/2011   HCT 41.0 08/26/2011   MCV 87.6 08/26/2011   PLT 145 08/26/2011      Chemistry      Component Value Date/Time   NA 134* 08/26/2011 0748   NA 143 12/13/2010 0957   K 4.4 08/26/2011 0748   K 4.3 12/13/2010 0957   CL 102 08/26/2011 0748   CL 99 12/13/2010 0957   CO2 24 08/26/2011 0748   CO2 26 12/13/2010 0957   BUN 13 08/26/2011 0748   BUN 8 12/13/2010 0957   CREATININE 0.65  08/26/2011 0748   CREATININE 1.16* 05/31/2011 1530   CREATININE 0.7 12/13/2010 0957      Component Value Date/Time   CALCIUM 8.9 08/26/2011 0748   CALCIUM 8.9 12/13/2010 0957   ALKPHOS 81 08/26/2011 0748   ALKPHOS 94* 12/13/2010 0957   AST 17 08/26/2011 0748   AST 21 12/13/2010 0957   ALT 13 08/26/2011 0748   BILITOT 0.4 08/26/2011 0748   BILITOT 0.50 12/13/2010 0957       RADIOGRAPHIC STUDIES: Ct Chest W Contrast  09/02/2011  *RADIOLOGY REPORT*  Clinical Data:  Lymphadenopathy. Castleman's disease. The patient has received a rituximab  therapy.   CT CHEST, ABDOMEN AND PELVIS WITH CONTRAST  Technique:  Multidetector CT imaging of the chest, abdomen and pelvis was performed following the standard protocol during bolus administration of intravenous contrast.  Contrast: OMNIPAQUE IOHEXOL 300 MG/ML IV SOLN  Comparison:  CT 05/28/2011, MRI 05/30/2011, CT abdomen 04/28/2011  CT CHEST  Findings:  There is bilateral axillary lymphadenopathy not significantly changed from prior.  For example 10 mm short axis right axillary lymph node (image #14) is not changed from the 11 mm on prior.  There is a port in the right anterior chest wall.  No supraclavicular mediastinal lymphadenopathy.  No pericardial fluid.  Review of the lung windows demonstrates central lobular emphysema in the upper lobes.  No suspicious nodules.  Airways are normal  IMPRESSION: Mild axillary lymphadenopathy is unchanged.  CT ABDOMEN AND PELVIS  Findings:  Retroperitoneal lymphadenopathy is slightly decreased in size to the comparison to CT from 04/28/2011.  For example left periaortic lymph node (image) 65) measures 12 mm short axis compared to 15 mm on prior.  Just inferiorly on image 69,  16 mm left periaortic lymph node compares to 21 mm on prior.  Lymph node posterior to the IVC measures 7 mm short axis compared to 10 mm on prior.  Left common iliac lymph node measures 10 mm (image 78) compared to 13 mm on prior.  12 mm right external  iliac lymph node (image #100) is decreased and 14 mm on prior.  The spleen is decreased in size measuring 5 cm x 12 cm in axial dimension compared to 8 cm x 13 cm on prior.  No focal hepatic lesion.  Liver is mildly enlarged.  The gallbladder, pancreas, adrenal glands are normal.  There is mild perinephric stranding is similar to prior.  No evidence of hydronephrosis.  Stomach is normal.  There is mild thickening of the distal esophagus.  The small bowel and colon appear normal.  Abdominal aorta normal caliber.  No free fluid the pelvis.  There is interval decrease in the presacral fluid seen on prior. There is interval decrease in anasarca seen on prior.  Review of the bone windows demonstrates no aggressive osseous lesions.  IMPRESSION:  1.  Interval mild decrease in size  of retroperitoneal and pelvic lymphadenopathy. 2.  Significant interval decrease in size of the spleen. 3.  Interval decrease in anasarca.  Original Report Authenticated By: Genevive Bi, M.D.   Ct Abdomen Pelvis W Contrast  09/02/2011  *RADIOLOGY REPORT*  Clinical Data:  Lymphadenopathy. Castleman's disease. The patient has received a rituximab  therapy.   CT CHEST, ABDOMEN AND PELVIS WITH CONTRAST  Technique:  Multidetector CT imaging of the chest, abdomen and pelvis was performed following the standard protocol during bolus administration of intravenous contrast.  Contrast: OMNIPAQUE IOHEXOL 300 MG/ML IV SOLN  Comparison:  CT 05/28/2011, MRI 05/30/2011, CT abdomen 04/28/2011  CT CHEST  Findings:  There is bilateral axillary lymphadenopathy not significantly changed from prior.  For example 10 mm short axis right axillary lymph node (image #14) is not changed from the 11 mm on prior.  There is a port in the right anterior chest wall.  No supraclavicular mediastinal lymphadenopathy.  No pericardial fluid.  Review of the lung windows demonstrates central lobular emphysema in the upper lobes.  No suspicious nodules.  Airways are normal   IMPRESSION: Mild axillary lymphadenopathy is unchanged.  CT ABDOMEN AND PELVIS  Findings:  Retroperitoneal lymphadenopathy is slightly decreased in size to the comparison to CT from 04/28/2011.  For example left periaortic lymph node (image) 65) measures 12 mm short axis compared to 15 mm on prior.  Just inferiorly on image 69,  16 mm left periaortic lymph node compares to 21 mm on prior.  Lymph node posterior to the IVC measures 7 mm short axis compared to 10 mm on prior.  Left common iliac lymph node measures 10 mm (image 78) compared to 13 mm on prior.  12 mm right external iliac lymph node (image #100) is decreased and 14 mm on prior.  The spleen is decreased in size measuring 5 cm x 12 cm in axial dimension compared to 8 cm x 13 cm on prior.  No focal hepatic lesion.  Liver is mildly enlarged.  The gallbladder, pancreas, adrenal glands are normal.  There is mild perinephric stranding is similar to prior.  No evidence of hydronephrosis.  Stomach is normal.  There is mild thickening of the distal esophagus.  The small bowel and colon appear normal.  Abdominal aorta normal caliber.  No free fluid the pelvis.  There is interval decrease in the presacral fluid seen on prior. There is interval decrease in anasarca seen on prior.  Review of the bone windows demonstrates no aggressive osseous lesions.  IMPRESSION:  1.  Interval mild decrease in size of retroperitoneal and pelvic lymphadenopathy. 2.  Significant interval decrease in size of the spleen. 3.  Interval decrease in anasarca.  Original Report Authenticated By: Genevive Bi, M.D.    ASSESSMENT: This is a very pleasant 56 years old white female with history of Castleman's disease as well as idiopathic thrombocytopenic purpura. The patient is status post 9 cycles of weekly Rituxan with significant improvement in her disease. I discussed the scan results with the patient.   PLAN:  #1 I will refer the patient to either [facility] or [facility] for a second opinion regarding future treatment plan for her Castleman's disease. #2 I may consider the patient for maintenance treatment with Rituxan every 2 months. #3 she would come back for followup visit in 2 months for reevaluation and discussion of her treatment options based on the second opinion recommendation. The patient was advised to call me immediately if she  has any concerning symptoms and is interval   All questions were answered. The patient knows to call the clinic with any problems, questions or concerns. We can certainly see the patient much sooner if necessary.

## 2011-09-12 ENCOUNTER — Telehealth: Payer: Self-pay | Admitting: Internal Medicine

## 2011-09-12 NOTE — Telephone Encounter (Signed)
Pt appt. With Dr. Lowell Guitar @ [facility] is 09/16/11 @ 1:00. Faxed medical records. Slides will be fedex'ed. Pt is aware.

## 2011-09-20 ENCOUNTER — Telehealth: Payer: Self-pay | Admitting: *Deleted

## 2011-09-20 NOTE — Telephone Encounter (Signed)
error 

## 2011-09-21 ENCOUNTER — Telehealth: Payer: Self-pay | Admitting: Internal Medicine

## 2011-09-21 ENCOUNTER — Other Ambulatory Visit: Payer: Self-pay | Admitting: *Deleted

## 2011-09-21 NOTE — Telephone Encounter (Signed)
unable to get through home # called cell and husband  he will have her call 2/21   aom

## 2011-09-21 NOTE — Progress Notes (Signed)
error 

## 2011-09-28 ENCOUNTER — Telehealth: Payer: Self-pay | Admitting: Internal Medicine

## 2011-09-28 NOTE — Telephone Encounter (Signed)
lm to call for port flush week of 3/11  aom

## 2011-10-03 ENCOUNTER — Telehealth: Payer: Self-pay | Admitting: Internal Medicine

## 2011-10-03 ENCOUNTER — Encounter: Payer: Self-pay | Admitting: *Deleted

## 2011-10-03 NOTE — Progress Notes (Signed)
Clinical social worker received call from patient stating she would like support. The patient was contacted by CSW previously.  Ms. Cuthbert feels that she was not ready for counseling at the time, but now feels she needs help. She reports she is feeling extremely overwhelmed and is becoming depressed. She feels that her financial burden, lack of purpose (because she is currently not working), and lack of activities incorporated in her daily life are contributing to her current emotional status. CSW validated patient's feelings and recommended counseling provided at [facility]. We discussed patient's desire to identify new coping skills and allow her to "take control of her life". CSW will make referral to Juanetta Beets, counseling intern. CSW may provide patient with support program information.  Kathrin Penner, MSW, [facility] Clinical Social Worker [facility] [phone]

## 2011-10-03 NOTE — Telephone Encounter (Signed)
Pt confirmed flush appointment.

## 2011-10-10 ENCOUNTER — Ambulatory Visit (HOSPITAL_BASED_OUTPATIENT_CLINIC_OR_DEPARTMENT_OTHER): Payer: BC Managed Care – PPO

## 2011-10-10 DIAGNOSIS — D47Z2 Castleman disease: Secondary | ICD-10-CM

## 2011-10-10 DIAGNOSIS — Z452 Encounter for adjustment and management of vascular access device: Secondary | ICD-10-CM

## 2011-10-10 DIAGNOSIS — R599 Enlarged lymph nodes, unspecified: Secondary | ICD-10-CM

## 2011-10-10 MED ORDER — SODIUM CHLORIDE 0.9 % IJ SOLN
10.0000 mL | INTRAMUSCULAR | Status: DC | PRN
  Administered 2011-10-10: 10 mL via INTRAVENOUS
  Filled 2011-10-10: qty 10

## 2011-10-10 MED ORDER — HEPARIN SOD (PORK) LOCK FLUSH 100 UNIT/ML IV SOLN
500.0000 [IU] | Freq: Once | INTRAVENOUS | Status: AC
  Administered 2011-10-10: 500 [IU] via INTRAVENOUS
  Filled 2011-10-10: qty 5

## 2011-10-12 ENCOUNTER — Encounter: Payer: Self-pay | Admitting: Internal Medicine

## 2011-10-12 NOTE — Progress Notes (Signed)
Per Londell Moh Ms. Searight to schedule an appointment to meet with a Financial Advocate regarding her medical bills. She is scheduled to come in on Friday October 14, 2011. I e-mailed Bobbie at [facility] to put her account on hold. Will follow-up with SPI once we meet on Friday.

## 2011-10-14 ENCOUNTER — Encounter: Payer: Self-pay | Admitting: Internal Medicine

## 2011-10-14 NOTE — Progress Notes (Signed)
[name] came in today to apply for financial assistance. She has completed her application, but she did not bring proof of spouse's income. Advised [name] to return income information due to the fact we can not process application until we receive all paperwork. I e-mailed Cathy (pt. Acct) hospital account to check if she can put account on hold pending financial assistance status.

## 2011-10-14 NOTE — Progress Notes (Signed)
Individual Counseling Session #1  Data  Pt is a 56y/o married white female presenting to session per referral from social worker Kathrin Penner. Pt presents to session dressed casually and to season/circumstance. Pt of normal ht/wt for age. Pt alert and oriented to person/place/situation/time, appearance/hygeine within normal limits (WNL), normal eye contact, speech normal rate/volume. Pt is calm, mood alternated between euthymic and dysphoric with congruent affect (occ tearful). Thought processes are coherent, thought content WNL. Pt denies audio-visual hallucinations; denies homicidal ideation/intent/plan; reports occ SI ideation but denies intent/plan, denies hx of attempts. No sensory impairments noted.  Assessment  Presenting Problem: Pt reports feeling sad, emotionally overwhelmed, lack of energy, dysphoric many days. Hx of presenting problem: Pt reports presenting feelings started after tx for Castleman's disease and bills started to pile up. Pt reports that she has always worked as an adult and she and H have paid bills, but recent unemployment prior to surgery and hosp bills have left them unable to pay all bills. Pt reports that combo of being burdened financially and being physically limited impacting ability to work has left her feeling sad/overwhelmed. Family Structure: Pt reports she is married x30y to H, has 2 grown children who live nearby; dau has 2 grandchildren whom she loves to see as often as possible. Medical: Pt reports dx of Castleman's disease for which she is receiving tx in [facility]. Social Hx: Pt reports limited social supports - has few friends, describes best friends as sisters who live in [place]. 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 Dx Impression/Case Conceptualization: Dx Impression: Adjustment d/o w/ depressed mood, chronic (R/o MDD). Pt appears to have fused thoughts about financial situation to thoughts of self. That is, she appears to eval  self as failure since she cannot pay the bills. Pt may benefit from thought defusion and mindfulness techs to assist in managing emotional reactions to stressors. Interventions: Facilitated intake session w/ pt, oriented pt to services, explained confidentiality and rights, obtained informed consent - copy of prof disclosure given to pt, developed goals/tx plan w/ pt, collected info to determine necessity for services. Assessed for SI/HI. Responses: Pt stated understanding of services, rights, confidentiality, goals/tx plan. Pt provided info for assessment. Pt participated in the development of goals/tx plan and identified goals. Pt reported occ SI ideation, denies intent/plan, agreed to let coun know if thoughts intensify or if intent/plan emerges. Pt denies HI.  Plan  Goal: Pt wants to feel emotionally stable AEB decreasing depressed mood from 7 to 3 in next few months. Objective: Pt will develop, practice, and consistently use at least 2 coping skills (re: mindfulness techs, thought defusion techs, cog reframing, etc.) in next month Intervention: Counselor will provide empathy, reflective listening, and unconditional positive regard. Counselor will incorporate bx modeling, DBT/ACT/CT skills in order to assist pt w/ development of coping techs and enhanced insight; to assist ct to ID, learn, utilize emotion coping strategies.  Arria Naim B MS, LPCA, NCC

## 2011-10-18 ENCOUNTER — Encounter: Payer: Self-pay | Admitting: Internal Medicine

## 2011-10-18 NOTE — Progress Notes (Signed)
Patient denied approval for EPP discount, for family of 2 income is [phone]

## 2011-10-19 ENCOUNTER — Encounter: Payer: Self-pay | Admitting: Internal Medicine

## 2011-10-19 NOTE — Progress Notes (Signed)
Patient called about financial Epp application she knows that she did not get approved, she wants to set up payment plan I advised her to call billing office she states that the billing office has sent account to collection, collection are asking her to make an payment that she really can't afford to pay.

## 2011-10-20 ENCOUNTER — Telehealth: Payer: Self-pay | Admitting: Medical Oncology

## 2011-10-20 ENCOUNTER — Other Ambulatory Visit: Payer: Self-pay | Admitting: Medical Oncology

## 2011-10-20 DIAGNOSIS — D47Z2 Castleman disease: Secondary | ICD-10-CM

## 2011-10-20 NOTE — Telephone Encounter (Addendum)
When is next rituxan? Per Dr Donnald Garre schedule pt for labs , md and rituxan next week. Request sent to scheduler

## 2011-10-21 ENCOUNTER — Telehealth: Payer: Self-pay | Admitting: Internal Medicine

## 2011-10-21 NOTE — Telephone Encounter (Signed)
lmonvm for pt re appts for 3/27 @ 11:45 am and 3/28 @ 10 am. Per 3/21 pof rituxan next wk and 15 min visit. Per stephanie J r/s 4/8 lb/fu to next week w/rituxan. Lb/fu/rituxan not able to be done same day.

## 2011-10-26 ENCOUNTER — Other Ambulatory Visit: Payer: BC Managed Care – PPO | Admitting: Lab

## 2011-10-26 ENCOUNTER — Ambulatory Visit (HOSPITAL_BASED_OUTPATIENT_CLINIC_OR_DEPARTMENT_OTHER): Payer: BC Managed Care – PPO | Admitting: Internal Medicine

## 2011-10-26 VITALS — BP 130/85 | HR 62 | Temp 97.7°F | Ht 63.0 in | Wt 120.7 lb

## 2011-10-26 DIAGNOSIS — D47Z2 Castleman disease: Secondary | ICD-10-CM

## 2011-10-26 DIAGNOSIS — R599 Enlarged lymph nodes, unspecified: Secondary | ICD-10-CM

## 2011-10-26 LAB — CBC WITH DIFFERENTIAL/PLATELET
Basophils Absolute: 0 10*3/uL (ref 0.0–0.1)
HCT: 41.1 % (ref 34.8–46.6)
HGB: 14.2 g/dL (ref 11.6–15.9)
MCH: 30.9 pg (ref 25.1–34.0)
MONO#: 0.6 10*3/uL (ref 0.1–0.9)
NEUT%: 54.7 % (ref 38.4–76.8)
lymph#: 1.8 10*3/uL (ref 0.9–3.3)

## 2011-10-26 LAB — COMPREHENSIVE METABOLIC PANEL
BUN: 15 mg/dL (ref 6–23)
CO2: 29 mEq/L (ref 19–32)
Calcium: 9.3 mg/dL (ref 8.4–10.5)
Chloride: 105 mEq/L (ref 96–112)
Creatinine, Ser: 0.67 mg/dL (ref 0.50–1.10)

## 2011-10-26 NOTE — Progress Notes (Signed)
[facility] [phone]   [phone]  OFFICE PROGRESS NOTE  [name], MD, MD [address] [facility], [place]. [place] [address]  DIAGNOSIS:  1. Castleman's Disease  2.Idiopathic Thrombocytopenic Purpura  3. 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  2) Weekly rituximab at 375 mg per meter squared with first cycle given in the hospital. Now status post 2 partial cycles and 7 complete cycles.  CURRENT THERAPY: The patient will start tomorrow the first dose of maintenance Rituxan 375 mg/M2 every 2 months.   INTERVAL HISTORY: [name] 56 y.o. female returns to the clinic today for followup visit. The patient is feeling fine except for occasional fatigue. She also has mild depression because of recent distress dealing with hospital bills. She was referred to Dr. Lowell Guitar at [facility] for a second opinion regarding her Castleman's disease. In the absence of any data but because the patient has clinical benefit from treatment with Rituxan, he agreed with the approach of maintenance Rituxan every 2 months. The patient is here today for evaluation before starting the first treatment. She denied having any significant weight loss, no night sweats. No chest pain or shortness of breath.  MEDICAL HISTORY: Past Medical History  Diagnosis Date  . Hypothyroidism   . Heart attack 2007  . Chronic kidney disease     failure  . Heart attack 2007  . Retroperitoneal lymphadenopathy 2012  . Castleman's disease   . ITP (idiopathic thrombocytopenic purpura) 06/17/2011    ALLERGIES:  is allergic to cefdinir; levaquin; and vicodin.  MEDICATIONS:  Current Outpatient Prescriptions  Medication Sig Dispense Refill  . ALPRAZolam (XANAX) 0.25 MG tablet Take 0.25 mg by mouth at bedtime  as needed. For sleep      . Hypromellose (GENTEAL OP) Apply 1 drop to eye daily.        Marland Kitchen levothyroxine (SYNTHROID, LEVOTHROID) 150 MCG tablet Take 150 mcg by mouth every morning.        . metoprolol succinate (TOPROL-XL) 25 MG 24 hr tablet Take 50 mg by mouth 2 (two) times daily.       . RiTUXimab (RITUXAN IV) Inject 1 application into the vein once a week. Through port in chest on Wednesdays at [facility], [address] [place]       . losartan-hydrochlorothiazide (HYZAAR) 100-12.5 MG per tablet Take 1 tablet by mouth daily.  30 tablet  0   No current facility-administered medications for this visit.   Facility-Administered Medications Ordered in Other Visits  Medication Dose Route Frequency Provider Last Rate Last Dose  . sodium chloride 0.9 % injection 10 mL  10 mL Intracatheter PRN Si Gaul, MD   10 mL at 07/06/11 1605  . sodium chloride 0.9 % injection 10 mL  10 mL Intracatheter PRN Si Gaul, MD        SURGICAL HISTORY:  Past Surgical History  Procedure Date  . Cesarean section 1983 1989  . Appendectomy     56 years old    REVIEW OF SYSTEMS:  A comprehensive review of systems was negative.   PHYSICAL EXAMINATION: General appearance: alert, cooperative and no distress Neck: no adenopathy Lymph nodes: Cervical, supraclavicular, and axillary nodes normal. Resp: clear to auscultation bilaterally Cardio: regular rate and rhythm, S1, S2 normal, no murmur, click, rub or gallop GI: soft, non-tender; bowel  sounds normal; no masses,  no organomegaly Extremities: extremities normal, atraumatic, no cyanosis or edema Neurologic: Alert and oriented X 3, normal strength and tone. Normal symmetric reflexes. Normal coordination and gait  ECOG PERFORMANCE STATUS: 1 - Symptomatic but completely ambulatory  Blood pressure 130/85, pulse 62, temperature 97.7 F (36.5 C), temperature source Oral, height 5\' 3"  (1.6 m), weight 120 lb 11.2 oz (54.749 kg).  LABORATORY DATA: Lab  Results  Component Value Date   WBC 6.2 10/26/2011   HGB 14.2 10/26/2011   HCT 41.1 10/26/2011   MCV 89.2 10/26/2011   PLT 232 10/26/2011      Chemistry      Component Value Date/Time   NA 134* 08/26/2011 0748   NA 143 12/13/2010 0957   K 4.4 08/26/2011 0748   K 4.3 12/13/2010 0957   CL 102 08/26/2011 0748   CL 99 12/13/2010 0957   CO2 24 08/26/2011 0748   CO2 26 12/13/2010 0957   BUN 13 08/26/2011 0748   BUN 8 12/13/2010 0957   CREATININE 0.65 08/26/2011 0748   CREATININE 1.16* 05/31/2011 1530   CREATININE 0.7 12/13/2010 0957      Component Value Date/Time   CALCIUM 8.9 08/26/2011 0748   CALCIUM 8.9 12/13/2010 0957   ALKPHOS 81 08/26/2011 0748   ALKPHOS 94* 12/13/2010 0957   AST 17 08/26/2011 0748   AST 21 12/13/2010 0957   ALT 13 08/26/2011 0748   BILITOT 0.4 08/26/2011 0748   BILITOT 0.50 12/13/2010 0957       RADIOGRAPHIC STUDIES: No results found.  ASSESSMENT: This is a very pleasant 56 years old white female with Castleman's disease status post treatment with Rituxan for 9 cycles with significant improvement in her disease.   PLAN: I have a lengthy discussion with the patient today about her maintenance treatment with Rituxan. The patient understands that there is no studies or clinical trials to support this approach but we will do it because she is having clinical response and benefit from this treatment. I discussed with the patient the adverse effect of the treatment and she agreed to proceed. She will receive the first cycle of maintenance treatment tomorrow. She would come back for followup visit in 2 months for reevaluation before starting cycle #2. The patient was advised to call me immediately if she has any concerning symptoms in the interval. For depression, she would see her primary care physician Dr. Jarold Motto for evaluation and starting antidepressant.  All questions were answered. The patient knows to call the clinic with any problems, questions or concerns. We can  certainly see the patient much sooner if necessary.  I spent 20 minutes counseling the patient face to face. The total time spent in the appointment was 35 minutes.

## 2011-10-27 ENCOUNTER — Ambulatory Visit: Payer: BC Managed Care – PPO

## 2011-10-27 ENCOUNTER — Ambulatory Visit (HOSPITAL_BASED_OUTPATIENT_CLINIC_OR_DEPARTMENT_OTHER): Payer: BC Managed Care – PPO

## 2011-10-27 ENCOUNTER — Ambulatory Visit: Payer: BC Managed Care – PPO | Admitting: Internal Medicine

## 2011-10-27 ENCOUNTER — Other Ambulatory Visit: Payer: BC Managed Care – PPO | Admitting: Lab

## 2011-10-27 VITALS — BP 118/75 | HR 73 | Temp 98.1°F

## 2011-10-27 DIAGNOSIS — Z5112 Encounter for antineoplastic immunotherapy: Secondary | ICD-10-CM

## 2011-10-27 DIAGNOSIS — D47Z2 Castleman disease: Secondary | ICD-10-CM

## 2011-10-27 DIAGNOSIS — R599 Enlarged lymph nodes, unspecified: Secondary | ICD-10-CM

## 2011-10-27 MED ORDER — SODIUM CHLORIDE 0.9 % IV SOLN
375.0000 mg/m2 | Freq: Once | INTRAVENOUS | Status: AC
  Administered 2011-10-27: 600 mg via INTRAVENOUS
  Filled 2011-10-27: qty 60

## 2011-10-27 MED ORDER — METHYLPREDNISOLONE SODIUM SUCC 125 MG IJ SOLR
125.0000 mg | Freq: Once | INTRAMUSCULAR | Status: AC
  Administered 2011-10-27: 125 mg via INTRAVENOUS

## 2011-10-27 MED ORDER — ACETAMINOPHEN 325 MG PO TABS
650.0000 mg | ORAL_TABLET | Freq: Once | ORAL | Status: AC
  Administered 2011-10-27: 650 mg via ORAL

## 2011-10-27 MED ORDER — HEPARIN SOD (PORK) LOCK FLUSH 100 UNIT/ML IV SOLN
500.0000 [IU] | Freq: Once | INTRAVENOUS | Status: AC | PRN
  Administered 2011-10-27: 500 [IU]
  Filled 2011-10-27: qty 5

## 2011-10-27 MED ORDER — LORAZEPAM 2 MG/ML IJ SOLN
1.0000 mg | Freq: Once | INTRAMUSCULAR | Status: AC
Start: 2011-10-27 — End: 2011-10-27
  Administered 2011-10-27: 1 mg via INTRAVENOUS

## 2011-10-27 MED ORDER — SODIUM CHLORIDE 0.9 % IV SOLN
Freq: Once | INTRAVENOUS | Status: AC
  Administered 2011-10-27: 10:00:00 via INTRAVENOUS

## 2011-10-27 MED ORDER — SODIUM CHLORIDE 0.9 % IJ SOLN
10.0000 mL | INTRAMUSCULAR | Status: DC | PRN
  Administered 2011-10-27: 10 mL
  Filled 2011-10-27: qty 10

## 2011-10-27 MED ORDER — DIPHENHYDRAMINE HCL 25 MG PO CAPS
50.0000 mg | ORAL_CAPSULE | Freq: Once | ORAL | Status: AC
  Administered 2011-10-27: 50 mg via ORAL

## 2011-10-27 NOTE — Progress Notes (Signed)
rituxan rate increased to 100 mg/hr for a rate of 56 ml's/hr for 26 ml's.

## 2011-10-27 NOTE — Progress Notes (Signed)
Rituxan rate increased to 150 mg/hr for a rate of 75ml's/hr for 39 ml's.

## 2011-11-03 ENCOUNTER — Telehealth: Payer: Self-pay | Admitting: Internal Medicine

## 2011-11-03 NOTE — Telephone Encounter (Signed)
l/m with 5/28 appt info   aom

## 2011-11-07 ENCOUNTER — Other Ambulatory Visit: Payer: BC Managed Care – PPO | Admitting: Lab

## 2011-11-07 ENCOUNTER — Ambulatory Visit: Payer: BC Managed Care – PPO | Admitting: Internal Medicine

## 2011-12-15 ENCOUNTER — Telehealth: Payer: Self-pay | Admitting: *Deleted

## 2011-12-15 NOTE — Telephone Encounter (Signed)
Pt called requesting that Dr Donnald Garre send a letter to her case worker regarding getting disability and the letter would need to have information about how she has castleman's disease and will be getting CT scans and Rituxan routinely.  Per dr Donnald Garre, pt can fill out HIPPA release form and get her medical records sent to the case worker and a letter would not be necessary.  HIPPA form mailed to pt's address.  Pt verbalized understanding.  SLJ

## 2011-12-27 ENCOUNTER — Ambulatory Visit (HOSPITAL_BASED_OUTPATIENT_CLINIC_OR_DEPARTMENT_OTHER): Payer: BC Managed Care – PPO | Admitting: Physician Assistant

## 2011-12-27 ENCOUNTER — Encounter: Payer: Self-pay | Admitting: Physician Assistant

## 2011-12-27 ENCOUNTER — Ambulatory Visit (HOSPITAL_BASED_OUTPATIENT_CLINIC_OR_DEPARTMENT_OTHER): Payer: BC Managed Care – PPO

## 2011-12-27 ENCOUNTER — Other Ambulatory Visit: Payer: Self-pay | Admitting: Physician Assistant

## 2011-12-27 ENCOUNTER — Other Ambulatory Visit (HOSPITAL_BASED_OUTPATIENT_CLINIC_OR_DEPARTMENT_OTHER): Payer: BC Managed Care – PPO | Admitting: Lab

## 2011-12-27 VITALS — BP 101/57 | HR 78 | Temp 97.9°F

## 2011-12-27 VITALS — BP 115/71 | HR 53 | Temp 96.8°F | Ht 63.0 in | Wt 126.0 lb

## 2011-12-27 DIAGNOSIS — R5383 Other fatigue: Secondary | ICD-10-CM

## 2011-12-27 DIAGNOSIS — Z5112 Encounter for antineoplastic immunotherapy: Secondary | ICD-10-CM

## 2011-12-27 DIAGNOSIS — R599 Enlarged lymph nodes, unspecified: Secondary | ICD-10-CM

## 2011-12-27 DIAGNOSIS — E039 Hypothyroidism, unspecified: Secondary | ICD-10-CM

## 2011-12-27 DIAGNOSIS — R5381 Other malaise: Secondary | ICD-10-CM

## 2011-12-27 DIAGNOSIS — D47Z2 Castleman disease: Secondary | ICD-10-CM

## 2011-12-27 LAB — COMPREHENSIVE METABOLIC PANEL
ALT: 10 U/L (ref 0–35)
CO2: 26 mEq/L (ref 19–32)
Chloride: 103 mEq/L (ref 96–112)
Potassium: 4.4 mEq/L (ref 3.5–5.3)
Sodium: 135 mEq/L (ref 135–145)
Total Bilirubin: 0.3 mg/dL (ref 0.3–1.2)
Total Protein: 6.8 g/dL (ref 6.0–8.3)

## 2011-12-27 LAB — CBC WITH DIFFERENTIAL/PLATELET
BASO%: 0.7 % (ref 0.0–2.0)
LYMPH%: 27.7 % (ref 14.0–49.7)
MCHC: 35.7 g/dL (ref 31.5–36.0)
MONO#: 1 10*3/uL — ABNORMAL HIGH (ref 0.1–0.9)
MONO%: 10.7 % (ref 0.0–14.0)
Platelets: 183 10*3/uL (ref 145–400)
RBC: 4.76 10*6/uL (ref 3.70–5.45)
WBC: 9 10*3/uL (ref 3.9–10.3)
nRBC: 0 % (ref 0–0)

## 2011-12-27 MED ORDER — ACETAMINOPHEN 325 MG PO TABS
650.0000 mg | ORAL_TABLET | Freq: Once | ORAL | Status: AC
  Administered 2011-12-27: 650 mg via ORAL

## 2011-12-27 MED ORDER — LORAZEPAM 2 MG/ML IJ SOLN
1.0000 mg | Freq: Once | INTRAMUSCULAR | Status: AC
  Administered 2011-12-27: 1 mg via INTRAVENOUS

## 2011-12-27 MED ORDER — SODIUM CHLORIDE 0.9 % IJ SOLN
10.0000 mL | INTRAMUSCULAR | Status: DC | PRN
  Administered 2011-12-27: 10 mL
  Filled 2011-12-27: qty 10

## 2011-12-27 MED ORDER — SODIUM CHLORIDE 0.9 % IV SOLN: Freq: Once | INTRAVENOUS | Status: DC

## 2011-12-27 MED ORDER — METHYLPREDNISOLONE SODIUM SUCC 125 MG IJ SOLR
125.0000 mg | Freq: Once | INTRAMUSCULAR | Status: AC
  Administered 2011-12-27: 125 mg via INTRAVENOUS

## 2011-12-27 MED ORDER — HEPARIN SOD (PORK) LOCK FLUSH 100 UNIT/ML IV SOLN
500.0000 [IU] | Freq: Once | INTRAVENOUS | Status: AC | PRN
  Administered 2011-12-27: 500 [IU]
  Filled 2011-12-27: qty 5

## 2011-12-27 MED ORDER — DIPHENHYDRAMINE HCL 25 MG PO CAPS
50.0000 mg | ORAL_CAPSULE | Freq: Once | ORAL | Status: AC
  Administered 2011-12-27: 50 mg via ORAL

## 2011-12-27 MED ORDER — SODIUM CHLORIDE 0.9 % IV SOLN
375.0000 mg/m2 | Freq: Once | INTRAVENOUS | Status: AC
  Administered 2011-12-27: 600 mg via INTRAVENOUS
  Filled 2011-12-27: qty 60

## 2011-12-27 NOTE — Progress Notes (Signed)
[facility] [phone]   Fax:([phone]  OFFICE PROGRESS NOTE  [name], MD, MD [address] [facility], [place]. [place] [address]  DIAGNOSIS:  1. Castleman's Disease  2.Idiopathic Thrombocytopenic Purpura  3. 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  2) Weekly rituximab at 375 mg per meter squared with first cycle given in the hospital. Now status post 2 partial cycles and 7 complete cycles.  CURRENT THERAPY: The patient will start tomorrow the first dose of maintenance Rituxan 375 mg/M2 every 2 months, status post 1 cycle.  INTERVAL HISTORY: Elliemae Braman 56 y.o. female returns to the clinic today for followup visit. She tolerated her first cycle of maintenance Rituxan without difficulty. She continues to complain of fatigue. She is wondering whether her thyroid may need to be rechecked although she denies having any increased problems with constipation. Her hypothyroidism is followed by her primary care physician Dr. Ivery Quale. She reports that her appetite is fair although she is somewhat concerned that she is not gaining weight. Her weight is stable. She voiced no other complaints. She denied having any significant weight loss, no night sweats. No chest pain or shortness of breath.  MEDICAL HISTORY: Past Medical History  Diagnosis Date  . Hypothyroidism   . Heart attack 2007  . Chronic kidney disease     failure  . Heart attack 2007  . Retroperitoneal lymphadenopathy 2012  . Castleman's disease   . ITP (idiopathic thrombocytopenic purpura) 06/17/2011    ALLERGIES:  is allergic to cefdinir; levofloxacin; and vicodin.  MEDICATIONS:  Current Outpatient Prescriptions  Medication Sig Dispense Refill  . ALPRAZolam (XANAX) 0.25 MG tablet Take 0.25 mg by mouth at  bedtime as needed. For sleep      . Hypromellose (GENTEAL OP) Apply 1 drop to eye daily.        Marland Kitchen levothyroxine (SYNTHROID, LEVOTHROID) 150 MCG tablet Take 150 mcg by mouth every morning.        . metoprolol succinate (TOPROL-XL) 25 MG 24 hr tablet Take 50 mg by mouth 2 (two) times daily.       . RiTUXimab (RITUXAN IV) Inject 1 application into the vein once a week. Through port in chest on Wednesdays at [facility], [address] [place]        No current facility-administered medications for this visit.   Facility-Administered Medications Ordered in Other Visits  Medication Dose Route Frequency Provider Last Rate Last Dose  . 0.9 %  sodium chloride infusion   Intravenous Once Si Gaul, MD      . acetaminophen (TYLENOL) tablet 650 mg  650 mg Oral Once Si Gaul, MD   650 mg at 12/27/11 1006  . diphenhydrAMINE (BENADRYL) capsule 50 mg  50 mg Oral Once Si Gaul, MD   50 mg at 12/27/11 1006  . heparin lock flush 100 unit/mL  500 Units Intracatheter Once PRN Si Gaul, MD      . LORazepam (ATIVAN) injection 1 mg  1 mg Intravenous Once Si Gaul, MD   1 mg at 12/27/11 1007  . methylPREDNISolone sodium succinate (SOLU-MEDROL) 125 mg/2 mL injection 125 mg  125 mg Intravenous Once Si Gaul, MD   125 mg at 12/27/11 1007  . riTUXimab (RITUXAN) 600 mg in sodium chloride 0.9 % 250 mL chemo infusion  375 mg/m2 (Treatment Plan Actual)  Intravenous Once Si Gaul, MD 57 mL/hr at 12/27/11 1109 600 mg at 12/27/11 1109  . sodium chloride 0.9 % injection 10 mL  10 mL Intracatheter PRN Si Gaul, MD   10 mL at 07/06/11 1605  . sodium chloride 0.9 % injection 10 mL  10 mL Intracatheter PRN Si Gaul, MD      . sodium chloride 0.9 % injection 10 mL  10 mL Intracatheter PRN Si Gaul, MD        SURGICAL HISTORY:  Past Surgical History  Procedure Date  . Cesarean section 1983 1989  . Appendectomy     56 years old    REVIEW OF SYSTEMS:  A  comprehensive review of systems was negative except for: Constitutional: positive for fatigue   PHYSICAL EXAMINATION: General appearance: alert, cooperative and no distress Neck: no adenopathy Lymph nodes: Cervical, supraclavicular, and axillary nodes normal. Resp: clear to auscultation bilaterally Cardio: regular rate and rhythm, S1, S2 normal, no murmur, click, rub or gallop GI: soft, non-tender; bowel sounds normal; no masses,  no organomegaly Extremities: extremities normal, atraumatic, no cyanosis or edema Neurologic: Alert and oriented X 3, normal strength and tone. Normal symmetric reflexes. Normal coordination and gait  ECOG PERFORMANCE STATUS: 1 - Symptomatic but completely ambulatory  Blood pressure 115/71, pulse 53, temperature 96.8 F (36 C), temperature source Oral, height 5\' 3"  (1.6 m), weight 126 lb (57.153 kg).  LABORATORY DATA: Lab Results  Component Value Date   WBC 9.0 12/27/2011   HGB 14.6 12/27/2011   HCT 40.9 12/27/2011   MCV 85.9 12/27/2011   PLT 183 12/27/2011      Chemistry      Component Value Date/Time   NA 138 10/26/2011 1145   NA 143 12/13/2010 0957   K 4.5 10/26/2011 1145   K 4.3 12/13/2010 0957   CL 105 10/26/2011 1145   CL 99 12/13/2010 0957   CO2 29 10/26/2011 1145   CO2 26 12/13/2010 0957   BUN 15 10/26/2011 1145   BUN 8 12/13/2010 0957   CREATININE 0.67 10/26/2011 1145   CREATININE 1.16* 05/31/2011 1530   CREATININE 0.7 12/13/2010 0957      Component Value Date/Time   CALCIUM 9.3 10/26/2011 1145   CALCIUM 8.9 12/13/2010 0957   ALKPHOS 84 10/26/2011 1145   ALKPHOS 94* 12/13/2010 0957   AST 20 10/26/2011 1145   AST 21 12/13/2010 0957   ALT 13 10/26/2011 1145   BILITOT 0.3 10/26/2011 1145   BILITOT 0.50 12/13/2010 0957       RADIOGRAPHIC STUDIES: No results found.  ASSESSMENT/PLAN: This is a very pleasant 56 years old white female with Castleman's disease status post treatment with Rituxan for 9 cycles with significant improvement in her disease.  She's now being treated with maintenance Rituxan at 375 mg read square given every 2 months status post 1 cycle. Patient was discussed with Dr. Arbutus Ped and she'll proceed with her second cycle of maintenance Rituxan as scheduled. To further evaluate her fatigue we will check a TSH today. If it is abnormal we will refer the results to her primary care physician for further management. She'll return in 2 months for her third cycle of maintenance Rituxan at 375 mg per meter squared with a repeat CBC differential and C. Met.  Laural Benes, Kloie Whiting E, PA-C   All questions were answered. The patient knows to call the clinic with any problems, questions or concerns. We can certainly see the patient much sooner if necessary.  I spent 20  minutes counseling the patient face to face. The total time spent in the appointment was 30 minutes.

## 2011-12-29 ENCOUNTER — Telehealth: Payer: Self-pay | Admitting: Internal Medicine

## 2011-12-29 NOTE — Telephone Encounter (Signed)
l/m with aptp info   aom

## 2012-01-09 ENCOUNTER — Other Ambulatory Visit: Payer: Self-pay | Admitting: Certified Registered Nurse Anesthetist

## 2012-01-18 ENCOUNTER — Telehealth: Payer: Self-pay | Admitting: Medical Oncology

## 2012-01-18 NOTE — Telephone Encounter (Signed)
Wants thyroid results.

## 2012-01-18 NOTE — Telephone Encounter (Signed)
Faxed TSH result to Dr Dossie Arbour

## 2012-01-23 ENCOUNTER — Telehealth: Payer: Self-pay | Admitting: Medical Oncology

## 2012-01-23 NOTE — Telephone Encounter (Signed)
Pt nervous about having port -"fear of blood clot" . She denies any problem with the port. I  told her to continue to get it flushed every 6-8 weeks and I will consult with Dr Donnald Garre when he returns. Pt voices understanding.

## 2012-02-28 ENCOUNTER — Telehealth: Payer: Self-pay | Admitting: General Practice

## 2012-02-28 ENCOUNTER — Ambulatory Visit (HOSPITAL_BASED_OUTPATIENT_CLINIC_OR_DEPARTMENT_OTHER): Payer: BC Managed Care – PPO | Admitting: Physician Assistant

## 2012-02-28 ENCOUNTER — Telehealth: Payer: Self-pay | Admitting: *Deleted

## 2012-02-28 ENCOUNTER — Encounter: Payer: Self-pay | Admitting: Physician Assistant

## 2012-02-28 ENCOUNTER — Ambulatory Visit (HOSPITAL_BASED_OUTPATIENT_CLINIC_OR_DEPARTMENT_OTHER): Payer: BC Managed Care – PPO

## 2012-02-28 ENCOUNTER — Other Ambulatory Visit (HOSPITAL_BASED_OUTPATIENT_CLINIC_OR_DEPARTMENT_OTHER): Payer: BC Managed Care – PPO | Admitting: Lab

## 2012-02-28 VITALS — BP 110/66 | HR 79 | Temp 98.2°F

## 2012-02-28 VITALS — BP 130/76 | HR 69 | Temp 96.9°F | Ht 64.5 in | Wt 141.5 lb

## 2012-02-28 DIAGNOSIS — R599 Enlarged lymph nodes, unspecified: Secondary | ICD-10-CM

## 2012-02-28 DIAGNOSIS — Z5112 Encounter for antineoplastic immunotherapy: Secondary | ICD-10-CM

## 2012-02-28 DIAGNOSIS — D47Z2 Castleman disease: Secondary | ICD-10-CM

## 2012-02-28 DIAGNOSIS — D693 Immune thrombocytopenic purpura: Secondary | ICD-10-CM

## 2012-02-28 LAB — COMPREHENSIVE METABOLIC PANEL
ALT: 11 U/L (ref 0–35)
Alkaline Phosphatase: 101 U/L (ref 39–117)
CO2: 22 mEq/L (ref 19–32)
Potassium: 4.7 mEq/L (ref 3.5–5.3)
Sodium: 138 mEq/L (ref 135–145)
Total Bilirubin: 0.3 mg/dL (ref 0.3–1.2)
Total Protein: 7 g/dL (ref 6.0–8.3)

## 2012-02-28 LAB — CBC WITH DIFFERENTIAL/PLATELET
BASO%: 0.6 % (ref 0.0–2.0)
EOS%: 3.9 % (ref 0.0–7.0)
LYMPH%: 24.2 % (ref 14.0–49.7)
MCHC: 34.7 g/dL (ref 31.5–36.0)
MCV: 88.9 fL (ref 79.5–101.0)
MONO%: 9.2 % (ref 0.0–14.0)
Platelets: 296 10*3/uL (ref 145–400)
RBC: 4.79 10*6/uL (ref 3.70–5.45)
nRBC: 0 % (ref 0–0)

## 2012-02-28 MED ORDER — HEPARIN SOD (PORK) LOCK FLUSH 100 UNIT/ML IV SOLN
500.0000 [IU] | Freq: Once | INTRAVENOUS | Status: AC | PRN
  Administered 2012-02-28: 500 [IU]
  Filled 2012-02-28: qty 5

## 2012-02-28 MED ORDER — SODIUM CHLORIDE 0.9 % IV SOLN
375.0000 mg/m2 | Freq: Once | INTRAVENOUS | Status: AC
  Administered 2012-02-28: 600 mg via INTRAVENOUS
  Filled 2012-02-28: qty 60

## 2012-02-28 MED ORDER — SODIUM CHLORIDE 0.9 % IJ SOLN
3.0000 mL | INTRAMUSCULAR | Status: DC | PRN
  Filled 2012-02-28: qty 10

## 2012-02-28 MED ORDER — SODIUM CHLORIDE 0.9 % IV SOLN
Freq: Once | INTRAVENOUS | Status: AC
  Administered 2012-02-28: 10:00:00 via INTRAVENOUS

## 2012-02-28 MED ORDER — SODIUM CHLORIDE 0.9 % IJ SOLN
10.0000 mL | INTRAMUSCULAR | Status: DC | PRN
  Administered 2012-02-28: 10 mL
  Filled 2012-02-28: qty 10

## 2012-02-28 MED ORDER — DIPHENHYDRAMINE HCL 25 MG PO CAPS
50.0000 mg | ORAL_CAPSULE | Freq: Once | ORAL | Status: AC
Start: 2012-02-28 — End: 2012-02-28
  Administered 2012-02-28: 50 mg via ORAL

## 2012-02-28 MED ORDER — ACETAMINOPHEN 325 MG PO TABS
650.0000 mg | ORAL_TABLET | Freq: Once | ORAL | Status: AC
  Administered 2012-02-28: 650 mg via ORAL

## 2012-02-28 MED ORDER — LORAZEPAM 2 MG/ML IJ SOLN
1.0000 mg | Freq: Once | INTRAMUSCULAR | Status: AC
  Administered 2012-02-28: 1 mg via INTRAVENOUS

## 2012-02-28 MED ORDER — METHYLPREDNISOLONE SODIUM SUCC 125 MG IJ SOLR
125.0000 mg | Freq: Once | INTRAMUSCULAR | Status: AC
  Administered 2012-02-28: 125 mg via INTRAVENOUS

## 2012-02-28 NOTE — Progress Notes (Signed)
[facility] Telephone:(336) [phone]   Fax:(336) [phone]  OFFICE PROGRESS NOTE  [name], MD [address] [facility], [place]. [place] [address]  DIAGNOSIS:  1. Castleman's Disease  2.Idiopathic Thrombocytopenic Purpura  3. 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  2) Weekly rituximab at 375 mg per meter squared with first cycle given in the hospital. Now status post 2 partial cycles and 7 complete cycles.  CURRENT THERAPY: The patient will start tomorrow the first dose of maintenance Rituxan 375 mg/M2 every 2 months, status post 2 cycles.  INTERVAL HISTORY: [name] 56 y.o. female returns to the clinic today for followup visit. Overall she's tolerating her maintenance Rituxan relatively well. She does report some increased anxiety particularly when she has to come back to the cancer Center for treatment. She reports an occasional "foggy feeling in the head". She also has some decreased energy. She reports that her thyroid medication dose has been decreased. Her medication list has been reviewed and updated to reflect this change. She voiced no other specific complaints today. She does report that she has gained some weight. She denied having any significant weight loss, no night sweats. No chest pain or shortness of breath.  MEDICAL HISTORY: Past Medical History  Diagnosis Date  . Hypothyroidism   . Heart attack 2007  . Chronic kidney disease     failure  . Heart attack 2007  . Retroperitoneal lymphadenopathy 2012  . Castleman's disease   . ITP (idiopathic thrombocytopenic purpura) 06/17/2011    ALLERGIES:  is allergic to cefdinir; levofloxacin; and vicodin.  MEDICATIONS:  Current Outpatient Prescriptions  Medication Sig Dispense Refill  . ALPRAZolam (XANAX) 0.25 MG tablet Take 0.25  mg by mouth at bedtime as needed. For sleep      . Hypromellose (GENTEAL OP) Apply 1 drop to eye daily.        Marland Kitchen levothyroxine (SYNTHROID, LEVOTHROID) 150 MCG tablet Take 125 mcg by mouth every morning.       . metoprolol succinate (TOPROL-XL) 25 MG 24 hr tablet Take 50 mg by mouth 2 (two) times daily.       . RiTUXimab (RITUXAN IV) Inject 1 application into the vein once a week. Through port in chest on Wednesdays at [facility], [address] [place]        No current facility-administered medications for this visit.   Facility-Administered Medications Ordered in Other Visits  Medication Dose Route Frequency Provider Last Rate Last Dose  . sodium chloride 0.9 % injection 10 mL  10 mL Intracatheter PRN Si Gaul, MD        SURGICAL HISTORY:  Past Surgical History  Procedure Date  . Cesarean section 1983 1989  . Appendectomy     56 years old    REVIEW OF SYSTEMS:  A comprehensive review of systems was negative except for: Constitutional: positive for fatigue Behavioral/Psych: positive for anxiety   PHYSICAL EXAMINATION: General appearance: alert, cooperative and no distress Neck: no adenopathy Lymph nodes: Cervical, supraclavicular, and axillary nodes normal. Resp: clear to auscultation bilaterally Cardio: regular rate and rhythm, S1, S2 normal, no murmur, click, rub or gallop GI: soft, non-tender; bowel sounds normal; no masses,  no organomegaly Extremities: extremities normal, atraumatic, no cyanosis or edema Neurologic: Alert and oriented X 3, normal strength and tone. Normal symmetric reflexes. Normal coordination and gait  ECOG PERFORMANCE STATUS: 1 - Symptomatic but completely ambulatory  Blood pressure 130/76, pulse 69, temperature 96.9 F (36.1 C), temperature source Oral, height 5' 4.5" (1.638 m), weight 141 lb 8 oz (64.184 kg).  LABORATORY DATA: Lab Results  Component Value Date   WBC 9.7 02/28/2012   HGB 14.8 02/28/2012   HCT 42.6 02/28/2012   MCV 88.9  02/28/2012   PLT 296 02/28/2012      Chemistry      Component Value Date/Time   NA 135 12/27/2011 0843   NA 143 12/13/2010 0957   K 4.4 12/27/2011 0843   K 4.3 12/13/2010 0957   CL 103 12/27/2011 0843   CL 99 12/13/2010 0957   CO2 26 12/27/2011 0843   CO2 26 12/13/2010 0957   BUN 13 12/27/2011 0843   BUN 8 12/13/2010 0957   CREATININE 0.58 12/27/2011 0843   CREATININE 1.16* 05/31/2011 1530   CREATININE 0.7 12/13/2010 0957      Component Value Date/Time   CALCIUM 9.1 12/27/2011 0843   CALCIUM 8.9 12/13/2010 0957   ALKPHOS 93 12/27/2011 0843   ALKPHOS 94* 12/13/2010 0957   AST 14 12/27/2011 0843   AST 21 12/13/2010 0957   ALT 10 12/27/2011 0843   BILITOT 0.3 12/27/2011 0843   BILITOT 0.50 12/13/2010 0957       RADIOGRAPHIC STUDIES: No results found.  ASSESSMENT/PLAN: This is a very pleasant 56 years old white female with Castleman's disease status post treatment with Rituxan for 9 cycles with significant improvement in her disease. She's now being treated with maintenance Rituxan at 375 mg/m2 given every 2 months status post 2 cycles. Patient was discussed with Dr. Arbutus Ped and she'll proceed with her third cycle of maintenance Rituxan as scheduled. She'll followup with Dr. Arbutus Ped in 2 months prior to her next scheduled cycle of maintenance Rituxan with a repeat CBC differential, C. met, and CT of the chest abdomen and pelvis with contrast to reevaluate her disease.  [name], Erin Geibel E, PA-C   All questions were answered. The patient knows to call the clinic with any problems, questions or concerns. We can certainly see the patient much sooner if necessary.  I spent 20 minutes counseling the patient face to face. The total time spent in the appointment was 30 minutes.

## 2012-02-28 NOTE — Telephone Encounter (Signed)
Per staff message and POF I have scheduled appt.  JMW  

## 2012-02-28 NOTE — Telephone Encounter (Signed)
appts made and printed for pt,pt aware that tx will be added for pt aom

## 2012-02-28 NOTE — Patient Instructions (Addendum)
[facility] Discharge Instructions for Patients Receiving Chemotherapy  Today you received the following chemotherapy agents Rituxin  To help prevent nausea and vomiting after your treatment, we encourage you to take your nausea medication Begin taking it at 7 pm and take it as often as prescribed for the next 24 to 72 hours.   If you develop nausea and vomiting that is not controlled by your nausea medication, call the clinic. If it is after clinic hours your family physician or the after hours number for the clinic or go to the Emergency Department.   BELOW ARE SYMPTOMS THAT SHOULD BE REPORTED IMMEDIATELY:  *FEVER GREATER THAN 100.5 F  *CHILLS WITH OR WITHOUT FEVER  NAUSEA AND VOMITING THAT IS NOT CONTROLLED WITH YOUR NAUSEA MEDICATION  *UNUSUAL SHORTNESS OF BREATH  *UNUSUAL BRUISING OR BLEEDING  TENDERNESS IN MOUTH AND THROAT WITH OR WITHOUT PRESENCE OF ULCERS  *URINARY PROBLEMS  *BOWEL PROBLEMS  UNUSUAL RASH Items with * indicate a potential emergency and should be followed up as soon as possible.  One of the nurses will contact you 24 hours after your treatment. Please let the nurse know about any problems that you may have experienced. Feel free to call the clinic you have any questions or concerns. The clinic phone number is [phone].   I have been informed and understand all the instructions given to me. I know to contact the clinic, my physician, or go to the Emergency Department if any problems should occur. I do not have any questions at this time, but understand that I may call the clinic during office hours   should I have any questions or need assistance in obtaining follow up care.    __________________________________________  _____________  __________ Signature of Patient or Authorized Representative            Date                   Time    __________________________________________ Nurse's Signature    

## 2012-04-26 ENCOUNTER — Other Ambulatory Visit (HOSPITAL_BASED_OUTPATIENT_CLINIC_OR_DEPARTMENT_OTHER): Payer: Medicare Other | Admitting: Lab

## 2012-04-26 ENCOUNTER — Ambulatory Visit (HOSPITAL_COMMUNITY)
Admission: RE | Admit: 2012-04-26 | Discharge: 2012-04-26 | Disposition: A | Discharge status: Home / Self Care | Payer: BC Managed Care – PPO | Source: Ambulatory Visit | Attending: Physician Assistant | Admitting: Physician Assistant

## 2012-04-26 DIAGNOSIS — Z79899 Other long term (current) drug therapy: Secondary | ICD-10-CM | POA: Insufficient documentation

## 2012-04-26 DIAGNOSIS — R599 Enlarged lymph nodes, unspecified: Secondary | ICD-10-CM

## 2012-04-26 DIAGNOSIS — D47Z2 Castleman disease: Secondary | ICD-10-CM

## 2012-04-26 DIAGNOSIS — R0602 Shortness of breath: Secondary | ICD-10-CM | POA: Insufficient documentation

## 2012-04-26 LAB — COMPREHENSIVE METABOLIC PANEL (CC13)
ALT: 18 U/L (ref 0–55)
AST: 20 U/L (ref 5–34)
Albumin: 4.2 g/dL (ref 3.5–5.0)
Alkaline Phosphatase: 123 U/L (ref 40–150)
BUN: 14 mg/dL (ref 7.0–26.0)
Calcium: 10 mg/dL (ref 8.4–10.4)
Chloride: 102 mEq/L (ref 98–107)
Potassium: 4.3 mEq/L (ref 3.5–5.1)

## 2012-04-26 LAB — CBC WITH DIFFERENTIAL/PLATELET
BASO%: 1 % (ref 0.0–2.0)
Basophils Absolute: 0.1 10*3/uL (ref 0.0–0.1)
EOS%: 4.3 % (ref 0.0–7.0)
HGB: 14.4 g/dL (ref 11.6–15.9)
MCH: 31.4 pg (ref 25.1–34.0)
MCHC: 34.1 g/dL (ref 31.5–36.0)
MCV: 91.9 fL (ref 79.5–101.0)
MONO%: 8.7 % (ref 0.0–14.0)
RBC: 4.6 10*6/uL (ref 3.70–5.45)
RDW: 12.8 % (ref 11.2–14.5)
lymph#: 2.3 10*3/uL (ref 0.9–3.3)

## 2012-04-26 MED ORDER — IOHEXOL 300 MG/ML  SOLN
100.0000 mL | Freq: Once | INTRAMUSCULAR | Status: AC | PRN
  Administered 2012-04-26: 100 mL via INTRAVENOUS

## 2012-04-30 ENCOUNTER — Telehealth: Payer: Self-pay | Admitting: *Deleted

## 2012-04-30 ENCOUNTER — Ambulatory Visit (HOSPITAL_BASED_OUTPATIENT_CLINIC_OR_DEPARTMENT_OTHER): Payer: Medicare Other

## 2012-04-30 ENCOUNTER — Ambulatory Visit (HOSPITAL_BASED_OUTPATIENT_CLINIC_OR_DEPARTMENT_OTHER): Payer: Medicare Other | Admitting: Internal Medicine

## 2012-04-30 ENCOUNTER — Telehealth: Payer: Self-pay | Admitting: Internal Medicine

## 2012-04-30 VITALS — BP 144/80 | HR 71 | Temp 97.3°F | Resp 18 | Ht 64.5 in | Wt 161.2 lb

## 2012-04-30 VITALS — BP 108/70 | HR 80 | Temp 98.5°F | Resp 18

## 2012-04-30 DIAGNOSIS — R599 Enlarged lymph nodes, unspecified: Secondary | ICD-10-CM

## 2012-04-30 DIAGNOSIS — D47Z2 Castleman disease: Secondary | ICD-10-CM

## 2012-04-30 DIAGNOSIS — Z5112 Encounter for antineoplastic immunotherapy: Secondary | ICD-10-CM

## 2012-04-30 DIAGNOSIS — D473 Essential (hemorrhagic) thrombocythemia: Secondary | ICD-10-CM

## 2012-04-30 MED ORDER — LORAZEPAM 2 MG/ML IJ SOLN
1.0000 mg | Freq: Once | INTRAMUSCULAR | Status: AC
  Administered 2012-04-30: 1 mg via INTRAVENOUS

## 2012-04-30 MED ORDER — DIPHENHYDRAMINE HCL 25 MG PO CAPS
50.0000 mg | ORAL_CAPSULE | Freq: Once | ORAL | Status: AC
  Administered 2012-04-30: 50 mg via ORAL

## 2012-04-30 MED ORDER — ACETAMINOPHEN 325 MG PO TABS
650.0000 mg | ORAL_TABLET | Freq: Once | ORAL | Status: AC
  Administered 2012-04-30: 650 mg via ORAL

## 2012-04-30 MED ORDER — METHYLPREDNISOLONE SODIUM SUCC 125 MG IJ SOLR
125.0000 mg | Freq: Once | INTRAMUSCULAR | Status: AC
  Administered 2012-04-30: 125 mg via INTRAVENOUS

## 2012-04-30 MED ORDER — SODIUM CHLORIDE 0.9 % IV SOLN
Freq: Once | INTRAVENOUS | Status: AC
  Administered 2012-04-30: 10:00:00 via INTRAVENOUS

## 2012-04-30 MED ORDER — SODIUM CHLORIDE 0.9 % IV SOLN
375.0000 mg/m2 | Freq: Once | INTRAVENOUS | Status: AC
  Administered 2012-04-30: 600 mg via INTRAVENOUS
  Filled 2012-04-30: qty 60

## 2012-04-30 NOTE — Telephone Encounter (Signed)
Per staff message and POF I have scheduled appt.  JMW  

## 2012-04-30 NOTE — Telephone Encounter (Signed)
Gave pt appt for November 2013 lab,and MD, emailed Marcelino Duster regarding chemo

## 2012-04-30 NOTE — Patient Instructions (Signed)
CT scan of the chest, abdomen and pelvis showed improvement in your disease. Continue treatment with Rituxan every 2 months Followup in 2 months

## 2012-04-30 NOTE — Progress Notes (Signed)
[facility] Telephone:(336) [phone]   Fax:(336) [phone]  OFFICE PROGRESS NOTE  [name], MD [address] [facility], [place]. [place] [address]  DIAGNOSIS:  1. Castleman's Disease  2.Idiopathic Thrombocytopenic Purpura  3. 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  2) Weekly rituximab at 375 mg per meter squared with first cycle given in the hospital. Now status post 2 partial cycles and 7 complete cycles.   CURRENT THERAPY: The patient will start tomorrow the first dose of maintenance Rituxan 375 mg/M2 every 2 months, status post 3 cycles.  INTERVAL HISTORY: Arbor Leer 56 y.o. female returns to the clinic today for routine followup visit. The patient is feeling fine today with no specific complaints. She denied having any significant weight loss or night sweats. She has no significant chest pain, shortness breath, cough or hemoptysis. The patient is tolerating her treatment with maintenance Rituxan fairly well with no significant adverse effects except for mild fatigue a few days after the infusion. She has repeat CT scan of the chest, abdomen and pelvis performed earlier today and she is here for evaluation and discussion of her scan results.  MEDICAL HISTORY: Past Medical History  Diagnosis Date  . Hypothyroidism   . Heart attack 2007  . Chronic kidney disease     failure  . Heart attack 2007  . Retroperitoneal lymphadenopathy 2012  . Castleman's disease   . ITP (idiopathic thrombocytopenic purpura) 06/17/2011    ALLERGIES:  is allergic to cefdinir; levofloxacin; and vicodin.  MEDICATIONS:  Current Outpatient Prescriptions  Medication Sig Dispense Refill  . Hypromellose (GENTEAL OP) Apply 1 drop to eye daily.        Marland Kitchen levothyroxine (SYNTHROID, LEVOTHROID) 150 MCG tablet Take 112 mcg  by mouth every morning.       . metoprolol succinate (TOPROL-XL) 25 MG 24 hr tablet Take 50 mg by mouth 2 (two) times daily.       . RiTUXimab (RITUXAN IV) Inject 1 application into the vein once a week. Through port in chest on Wednesdays at [facility], [address] [place]       . ALPRAZolam (XANAX) 0.25 MG tablet Take 0.25 mg by mouth at bedtime as needed. For sleep       No current facility-administered medications for this visit.   Facility-Administered Medications Ordered in Other Visits  Medication Dose Route Frequency Provider Last Rate Last Dose  . sodium chloride 0.9 % injection 10 mL  10 mL Intracatheter PRN Si Gaul, MD        SURGICAL HISTORY:  Past Surgical History  Procedure Date  . Cesarean section 1983 1989  . Appendectomy     56 years old    REVIEW OF SYSTEMS:  A comprehensive review of systems was negative.   PHYSICAL EXAMINATION: General appearance: alert, cooperative and no distress Head: Normocephalic, without obvious abnormality, atraumatic Neck: no adenopathy Lymph nodes: Cervical, supraclavicular, and axillary nodes normal. Resp: clear to auscultation bilaterally Cardio: regular rate and rhythm, S1, S2 normal, no murmur, click, rub or gallop GI: soft, non-tender; bowel sounds normal; no masses,  no organomegaly Extremities: extremities normal, atraumatic, no cyanosis or edema  ECOG PERFORMANCE STATUS: 0 - Asymptomatic  Blood pressure 144/80, pulse 71, temperature 97.3 F (36.3 C), temperature source Oral, resp. rate 18, height 5' 4.5" (1.638 m), weight 161 lb 3.2  oz (73.12 kg).  LABORATORY DATA: Lab Results  Component Value Date   WBC 7.5 04/26/2012   HGB 14.4 04/26/2012   HCT 42.3 04/26/2012   MCV 91.9 04/26/2012   PLT 327 04/26/2012      Chemistry      Component Value Date/Time   NA 137 04/26/2012 0817   NA 138 02/28/2012 0840   NA 143 12/13/2010 0957   K 4.3 04/26/2012 0817   K 4.7 02/28/2012 0840   K 4.3 12/13/2010 0957   CL 102  04/26/2012 0817   CL 107 02/28/2012 0840   CL 99 12/13/2010 0957   CO2 24 04/26/2012 0817   CO2 22 02/28/2012 0840   CO2 26 12/13/2010 0957   BUN 14.0 04/26/2012 0817   BUN 14 02/28/2012 0840   BUN 8 12/13/2010 0957   CREATININE 0.8 04/26/2012 0817   CREATININE 0.61 02/28/2012 0840   CREATININE 1.16* 05/31/2011 1530   CREATININE 0.7 12/13/2010 0957      Component Value Date/Time   CALCIUM 10.0 04/26/2012 0817   CALCIUM 9.4 02/28/2012 0840   CALCIUM 8.9 12/13/2010 0957   ALKPHOS 123 04/26/2012 0817   ALKPHOS 101 02/28/2012 0840   ALKPHOS 94* 12/13/2010 0957   AST 20 04/26/2012 0817   AST 15 02/28/2012 0840   AST 21 12/13/2010 0957   ALT 18 04/26/2012 0817   ALT 11 02/28/2012 0840   BILITOT 0.40 04/26/2012 0817   BILITOT 0.3 02/28/2012 0840   BILITOT 0.50 12/13/2010 0957       RADIOGRAPHIC STUDIES: Ct Chest W Contrast  04/26/2012  *RADIOLOGY REPORT*  Clinical Data:  Castleman's disease restaging.  Chemotherapy in progress.  Short of breath.  CT CHEST, ABDOMEN AND PELVIS WITH CONTRAST  Technique:  Multidetector CT imaging of the chest, abdomen and pelvis was performed following the standard protocol during bolus administration of intravenous contrast.  Contrast: OMNIPAQUE IOHEXOL 300 MG/ML  SOLN  Comparison:  CT scan 09/02/2011   CT CHEST  Findings:  Port in the right chest wall.  Small bilateral axillary lymph nodes are decreased in size compared to prior.  For example 9 mm right axillary lymph node is decreased from 10 mm on prior. Adjacent 6 mm lymph node decreased from 9 mm on prior (image 12). Similar findings in the left axilla.  No mediastinal or hilar lymphadenopathy.  No pericardial fluid.  Esophagus is normal.  Review of the lung parenchyma demonstrates no new or suspicious pulmonary nodules.  IMPRESSION: 1.  Decrease in size of axillary lymph nodes which are now less than 10 mm short axis. 2.  No evidence of progression of lymphadenopathy.   CT ABDOMEN AND PELVIS  Findings:  No focal hepatic  lesion.  The gallbladder  has small stones.  The pancreas, spleen, adrenal glands, and kidneys are normal. The spleen is reduced in volume and now  normal in size.  There is small left periaortic lymph nodes which are decreased in size compared to prior.  For example,  11 mm left periaortic lymph node (image 70) is decreased from the 16 mm short axis on prior. Lymph node at the left common iliac artery measures 8 mm (image 78) decreased from 10 mm on prior.  No new retroperitoneal or pelvic lymphadenopathy.  Superficial right external iliac lymph node measures 9 mm (image 101)  decreased from 12 mm on prior  The stomach, small bowel, and colon are normal.  No free fluid in the pelvis.  The uterus and ovaries  and bladder are normal. Review of  bone windows demonstrates no aggressive osseous lesions.  Stable small sclerotic lesion in the right iliac bone appears benign.  IMPRESSION:  1.  Interval reduction in periaortic and iliac lymphadenopathy. 2.   Continued reduction in splenic volume which is now normal.   Original Report Authenticated By: Genevive Bi, M.D.     ASSESSMENT: This is a very pleasant 56 years old white female with history of Castleman disease in addition to ITP currently on maintenance Rituxan every 2 months is status post 3 cycles with significant improvement in her lymphadenopathy.  PLAN: I discussed the scan results with the patient. I recommended for her to continue on maintenance Rituxan for now. She will start cycle #4 today. She would come back for followup visit in 2 months with the start of cycle #5.  The patient was advised to contact me immediately if she has any concerning symptoms in the interval.  All questions were answered. The patient knows to call the clinic with any problems, questions or concerns. We can certainly see the patient much sooner if necessary.  I spent 15 minutes counseling the patient face to face. The total time spent in the appointment was 25  minutes.

## 2012-05-03 ENCOUNTER — Telehealth: Payer: Self-pay | Admitting: Internal Medicine

## 2012-05-03 NOTE — Telephone Encounter (Signed)
Called pt and left message for 06/29/12 appt, lab, ML and chemo

## 2012-05-15 ENCOUNTER — Telehealth: Payer: Self-pay | Admitting: *Deleted

## 2012-05-15 NOTE — Telephone Encounter (Signed)
Pt called wanting to know whether it was okay for her to get the flu shot, called and left msg that it is okay for her to get the flu shot per Dr Donnald Garre.  SLJ

## 2012-06-29 ENCOUNTER — Telehealth: Payer: Self-pay | Admitting: Internal Medicine

## 2012-06-29 ENCOUNTER — Ambulatory Visit (HOSPITAL_BASED_OUTPATIENT_CLINIC_OR_DEPARTMENT_OTHER): Payer: BC Managed Care – PPO

## 2012-06-29 ENCOUNTER — Encounter: Payer: Self-pay | Admitting: Physician Assistant

## 2012-06-29 ENCOUNTER — Telehealth: Payer: Self-pay | Admitting: *Deleted

## 2012-06-29 ENCOUNTER — Ambulatory Visit (HOSPITAL_BASED_OUTPATIENT_CLINIC_OR_DEPARTMENT_OTHER): Payer: BC Managed Care – PPO | Admitting: Physician Assistant

## 2012-06-29 ENCOUNTER — Other Ambulatory Visit (HOSPITAL_BASED_OUTPATIENT_CLINIC_OR_DEPARTMENT_OTHER): Payer: BC Managed Care – PPO | Admitting: Lab

## 2012-06-29 VITALS — BP 109/57 | HR 58 | Temp 97.7°F | Resp 18

## 2012-06-29 VITALS — BP 149/93 | HR 60 | Temp 97.0°F | Resp 18 | Ht 64.5 in | Wt 174.0 lb

## 2012-06-29 DIAGNOSIS — R599 Enlarged lymph nodes, unspecified: Secondary | ICD-10-CM

## 2012-06-29 DIAGNOSIS — D47Z2 Castleman disease: Secondary | ICD-10-CM

## 2012-06-29 DIAGNOSIS — Z5112 Encounter for antineoplastic immunotherapy: Secondary | ICD-10-CM

## 2012-06-29 DIAGNOSIS — D693 Immune thrombocytopenic purpura: Secondary | ICD-10-CM

## 2012-06-29 LAB — CBC WITH DIFFERENTIAL/PLATELET
Basophils Absolute: 0.1 10*3/uL (ref 0.0–0.1)
HCT: 40 % (ref 34.8–46.6)
HGB: 13.7 g/dL (ref 11.6–15.9)
MONO#: 0.7 10*3/uL (ref 0.1–0.9)
NEUT%: 52.1 % (ref 38.4–76.8)
Platelets: 325 10*3/uL (ref 145–400)
WBC: 6.8 10*3/uL (ref 3.9–10.3)
lymph#: 2.3 10*3/uL (ref 0.9–3.3)

## 2012-06-29 LAB — COMPREHENSIVE METABOLIC PANEL (CC13)
ALT: 17 U/L (ref 0–55)
AST: 17 U/L (ref 5–34)
Calcium: 9.1 mg/dL (ref 8.4–10.4)
Chloride: 105 mEq/L (ref 98–107)
Creatinine: 0.8 mg/dL (ref 0.6–1.1)
Total Bilirubin: 0.33 mg/dL (ref 0.20–1.20)

## 2012-06-29 LAB — LACTATE DEHYDROGENASE (CC13): LDH: 174 U/L (ref 125–245)

## 2012-06-29 MED ORDER — LORAZEPAM 2 MG/ML IJ SOLN
1.0000 mg | Freq: Once | INTRAMUSCULAR | Status: AC
  Administered 2012-06-29: 1 mg via INTRAVENOUS

## 2012-06-29 MED ORDER — DIPHENHYDRAMINE HCL 25 MG PO CAPS
50.0000 mg | ORAL_CAPSULE | Freq: Once | ORAL | Status: AC
  Administered 2012-06-29: 50 mg via ORAL

## 2012-06-29 MED ORDER — SODIUM CHLORIDE 0.9 % IJ SOLN
10.0000 mL | INTRAMUSCULAR | Status: DC | PRN
  Administered 2012-06-29: 10 mL
  Filled 2012-06-29: qty 10

## 2012-06-29 MED ORDER — SODIUM CHLORIDE 0.9 % IV SOLN
375.0000 mg/m2 | Freq: Once | INTRAVENOUS | Status: AC
  Administered 2012-06-29: 700 mg via INTRAVENOUS
  Filled 2012-06-29: qty 70

## 2012-06-29 MED ORDER — HEPARIN SOD (PORK) LOCK FLUSH 100 UNIT/ML IV SOLN
500.0000 [IU] | Freq: Once | INTRAVENOUS | Status: AC | PRN
  Administered 2012-06-29: 500 [IU]
  Filled 2012-06-29: qty 5

## 2012-06-29 MED ORDER — METHYLPREDNISOLONE SODIUM SUCC 125 MG IJ SOLR
125.0000 mg | Freq: Once | INTRAMUSCULAR | Status: AC
  Administered 2012-06-29: 125 mg via INTRAVENOUS

## 2012-06-29 MED ORDER — ACETAMINOPHEN 325 MG PO TABS
650.0000 mg | ORAL_TABLET | Freq: Once | ORAL | Status: AC
  Administered 2012-06-29: 650 mg via ORAL

## 2012-06-29 NOTE — Telephone Encounter (Signed)
Per staff message and POF I have scheduled appt.  JMW  

## 2012-06-29 NOTE — Patient Instructions (Signed)
[facility] Discharge Instructions for Patients Receiving Chemotherapy    If you develop any of the following symptoms, call the clinic. If it is after clinic hours your family physician or the after hours number for the clinic or go to the Emergency Department.   BELOW ARE SYMPTOMS THAT SHOULD BE REPORTED IMMEDIATELY:  *FEVER GREATER THAN 100.5 F  *CHILLS WITH OR WITHOUT FEVER  NAUSEA AND VOMITING THAT IS NOT CONTROLLED WITH YOUR NAUSEA MEDICATION  *UNUSUAL SHORTNESS OF BREATH  *UNUSUAL BRUISING OR BLEEDING  TENDERNESS IN MOUTH AND THROAT WITH OR WITHOUT PRESENCE OF ULCERS  *URINARY PROBLEMS  *BOWEL PROBLEMS  UNUSUAL RASH Items with * indicate a potential emergency and should be followed up as soon as possible.  Feel free to call the clinic you have any questions or concerns. The clinic phone number is [phone].   I have been informed and understand all the instructions given to me. I know to contact the clinic, my physician, or go to the Emergency Department if any problems should occur. I do not have any questions at this time, but understand that I may call the clinic during office hours   should I have any questions or need assistance in obtaining follow up care.

## 2012-06-29 NOTE — Patient Instructions (Addendum)
Follow up in 2 months prior to your next cycle of maintenance Rituxan 

## 2012-06-29 NOTE — Progress Notes (Signed)
[facility] [phone]   Fax:([phone]  OFFICE PROGRESS NOTE  [name], MD [address] [facility], [place]. [place] [address]  DIAGNOSIS:  1. Castleman's Disease  2.Idiopathic Thrombocytopenic Purpura  3. 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  2) Weekly rituximab at 375 mg per meter squared with first cycle given in the hospital. Now status post 2 partial cycles and 7 complete cycles.   CURRENT THERAPY: The patient will start tomorrow the first dose of maintenance Rituxan 375 mg/M2 every 2 months, status post 4 cycles.  INTERVAL HISTORY: [name] 56 y.o. female returns to the clinic today for routine followup visit. The patient is feeling fine today with no specific complaints. She denied having any significant weight loss or night sweats. She has no significant chest pain, shortness breath, cough or hemoptysis. The patient is tolerating her treatment with maintenance Rituxan fairly well with no significant adverse effects except for mild fatigue a few days after the infusion. She also reports some occasional vision problems. She has seen her neurologist, the cause of vision changes is unknown but do not seem to be overly problematic. Patient knows to seek evaluation should the vision changes become more consistent and persistent.  MEDICAL HISTORY: Past Medical History  Diagnosis Date  . Hypothyroidism   . Heart attack 2007  . Chronic kidney disease     failure  . Heart attack 2007  . Retroperitoneal lymphadenopathy 2012  . Castleman's disease   . ITP (idiopathic thrombocytopenic purpura) 06/17/2011    ALLERGIES:  is allergic to cefdinir; levofloxacin; and vicodin.  MEDICATIONS:  Current Outpatient Prescriptions  Medication Sig Dispense Refill  . levothyroxine  (SYNTHROID, LEVOTHROID) 112 MCG tablet Take 112 mcg by mouth daily.      Marland Kitchen ALPRAZolam (XANAX) 0.25 MG tablet Take 0.25 mg by mouth at bedtime as needed. For sleep      . Hypromellose (GENTEAL OP) Apply 1 drop to eye daily.        . metoprolol succinate (TOPROL-XL) 25 MG 24 hr tablet Take 50 mg by mouth 2 (two) times daily.       . RiTUXimab (RITUXAN IV) Inject 1 application into the vein once a week. Through port in chest on Wednesdays at [facility] [address] [place]        No current facility-administered medications for this visit.   Facility-Administered Medications Ordered in Other Visits  Medication Dose Route Frequency Provider Last Rate Last Dose  . sodium chloride 0.9 % injection 10 mL  10 mL Intracatheter PRN Si Gaul, MD        SURGICAL HISTORY:  Past Surgical History  Procedure Date  . Cesarean section 1983 1989  . Appendectomy     56 years old    REVIEW OF SYSTEMS:  Pertinent items are noted in HPI.   PHYSICAL EXAMINATION: General appearance: alert, cooperative and no distress Head: Normocephalic, without obvious abnormality, atraumatic Neck: no adenopathy Lymph nodes: Cervical, supraclavicular, and axillary nodes normal. Resp: clear to auscultation bilaterally Cardio: regular rate and rhythm, S1, S2 normal, no murmur, click, rub or gallop GI: soft, non-tender; bowel sounds normal; no masses,  no organomegaly Extremities: extremities normal, atraumatic, no cyanosis or edema  ECOG PERFORMANCE STATUS: 0 - Asymptomatic  Blood pressure 149/93, pulse 60, temperature 97 F (36.1 C), temperature source Oral,  resp. rate 18, height 5' 4.5" (1.638 m), weight 174 lb (78.926 kg).  LABORATORY DATA: Lab Results  Component Value Date   WBC 6.8 06/29/2012   HGB 13.7 06/29/2012   HCT 40.0 06/29/2012   MCV 90.5 06/29/2012   PLT 325 06/29/2012      Chemistry      Component Value Date/Time   NA 137 04/26/2012 0817   NA 138 02/28/2012 0840   NA 143 12/13/2010  0957   K 4.3 04/26/2012 0817   K 4.7 02/28/2012 0840   K 4.3 12/13/2010 0957   CL 102 04/26/2012 0817   CL 107 02/28/2012 0840   CL 99 12/13/2010 0957   CO2 24 04/26/2012 0817   CO2 22 02/28/2012 0840   CO2 26 12/13/2010 0957   BUN 14.0 04/26/2012 0817   BUN 14 02/28/2012 0840   BUN 8 12/13/2010 0957   CREATININE 0.8 04/26/2012 0817   CREATININE 0.61 02/28/2012 0840   CREATININE 1.16* 05/31/2011 1530   CREATININE 0.7 12/13/2010 0957      Component Value Date/Time   CALCIUM 10.0 04/26/2012 0817   CALCIUM 9.4 02/28/2012 0840   CALCIUM 8.9 12/13/2010 0957   ALKPHOS 123 04/26/2012 0817   ALKPHOS 101 02/28/2012 0840   ALKPHOS 94* 12/13/2010 0957   AST 20 04/26/2012 0817   AST 15 02/28/2012 0840   AST 21 12/13/2010 0957   ALT 18 04/26/2012 0817   ALT 11 02/28/2012 0840   BILITOT 0.40 04/26/2012 0817   BILITOT 0.3 02/28/2012 0840   BILITOT 0.50 12/13/2010 0957       RADIOGRAPHIC STUDIES: Ct Chest W Contrast  04/26/2012  *RADIOLOGY REPORT*  Clinical Data:  Castleman's disease restaging.  Chemotherapy in progress.  Short of breath.  CT CHEST, ABDOMEN AND PELVIS WITH CONTRAST  Technique:  Multidetector CT imaging of the chest, abdomen and pelvis was performed following the standard protocol during bolus administration of intravenous contrast.  Contrast: OMNIPAQUE IOHEXOL 300 MG/ML  SOLN  Comparison:  CT scan 09/02/2011   CT CHEST  Findings:  Port in the right chest wall.  Small bilateral axillary lymph nodes are decreased in size compared to prior.  For example 9 mm right axillary lymph node is decreased from 10 mm on prior. Adjacent 6 mm lymph node decreased from 9 mm on prior (image 12). Similar findings in the left axilla.  No mediastinal or hilar lymphadenopathy.  No pericardial fluid.  Esophagus is normal.  Review of the lung parenchyma demonstrates no new or suspicious pulmonary nodules.  IMPRESSION: 1.  Decrease in size of axillary lymph nodes which are now less than 10 mm short axis. 2.  No evidence  of progression of lymphadenopathy.   CT ABDOMEN AND PELVIS  Findings:  No focal hepatic lesion.  The gallbladder  has small stones.  The pancreas, spleen, adrenal glands, and kidneys are normal. The spleen is reduced in volume and now  normal in size.  There is small left periaortic lymph nodes which are decreased in size compared to prior.  For example,  11 mm left periaortic lymph node (image 70) is decreased from the 16 mm short axis on prior. Lymph node at the left common iliac artery measures 8 mm (image 78) decreased from 10 mm on prior.  No new retroperitoneal or pelvic lymphadenopathy.  Superficial right external iliac lymph node measures 9 mm (image 101)  decreased from 12 mm on prior  The stomach, small bowel, and colon are normal.  No  free fluid in the pelvis.  The uterus and ovaries and bladder are normal. Review of  bone windows demonstrates no aggressive osseous lesions.  Stable small sclerotic lesion in the right iliac bone appears benign.  IMPRESSION:  1.  Interval reduction in periaortic and iliac lymphadenopathy. 2.   Continued reduction in splenic volume which is now normal.   Original Report Authenticated By: Genevive Bi, M.D.     ASSESSMENT/PLAN: This is a very pleasant 56 years old white female with history of Castleman disease in addition to ITP currently on maintenance Rituxan every 2 months is status post 4 cycles with significant improvement in her lymphadenopathy. The patient was discussed with Dr. Truett Perna in Dr. Asa Lente absence. She'll proceed with cycle #6 of her maintenance Rituxan today as scheduled. She will followup in 2 months with repeat CBC differential, C. met the age prior to cycle #6 of her maintenance Rituxan.   Conni Slipper, PA-C  The patient knows to call the clinic with any problems, questions or concerns. We can certainly see the patient much sooner if necessary.  I spent 20 minutes counseling the patient face to face. The total time spent in the  appointment was 30 minutes.

## 2012-06-29 NOTE — Telephone Encounter (Signed)
appts made and printed for pt aom °

## 2012-07-03 ENCOUNTER — Encounter: Payer: Self-pay | Admitting: Internal Medicine

## 2012-07-04 ENCOUNTER — Telehealth: Payer: Self-pay | Admitting: Medical Oncology

## 2012-07-04 NOTE — Telephone Encounter (Signed)
I emailed Thelma Barge about tis

## 2012-07-05 NOTE — Progress Notes (Signed)
Patient sent in a form to pay off her student loan. I spoke with Dr Arbutus Ped x 2 and he said he would not sign it as it stated it would likely end in death or she would be disabled for at least 60 months. I called her and explained that.  She asked me to mail the forms back to her.  This was done.  07/04/12 Patient called and wants a letter stating her dx and treatment plan. I referred her back to the nurse.

## 2012-08-23 ENCOUNTER — Telehealth: Payer: Self-pay | Admitting: Medical Oncology

## 2012-08-23 NOTE — Telephone Encounter (Signed)
[name] asking if she can get her port removed and  recheck a CT scan in 6 months and delay therapy for 6 months . I told her that I will ask Dr Arbutus Ped.

## 2012-08-25 NOTE — Telephone Encounter (Signed)
i 'd not advise that because she may need treatment again but final decision is hers.

## 2012-08-27 NOTE — Telephone Encounter (Signed)
Pt notified re : Dr Asa Lente response. She will do what he recommends.

## 2012-08-28 ENCOUNTER — Other Ambulatory Visit (HOSPITAL_BASED_OUTPATIENT_CLINIC_OR_DEPARTMENT_OTHER): Payer: BC Managed Care – PPO | Admitting: Lab

## 2012-08-28 ENCOUNTER — Telehealth: Payer: Self-pay | Admitting: *Deleted

## 2012-08-28 ENCOUNTER — Encounter: Payer: Self-pay | Admitting: Physician Assistant

## 2012-08-28 ENCOUNTER — Ambulatory Visit (HOSPITAL_BASED_OUTPATIENT_CLINIC_OR_DEPARTMENT_OTHER): Payer: BC Managed Care – PPO | Admitting: Physician Assistant

## 2012-08-28 ENCOUNTER — Telehealth: Payer: Self-pay | Admitting: Internal Medicine

## 2012-08-28 ENCOUNTER — Ambulatory Visit (HOSPITAL_BASED_OUTPATIENT_CLINIC_OR_DEPARTMENT_OTHER): Payer: BC Managed Care – PPO

## 2012-08-28 VITALS — BP 122/64 | HR 90 | Temp 98.3°F | Resp 20

## 2012-08-28 VITALS — BP 132/84 | HR 63 | Temp 97.0°F | Resp 20 | Ht 64.5 in | Wt 183.1 lb

## 2012-08-28 DIAGNOSIS — D693 Immune thrombocytopenic purpura: Secondary | ICD-10-CM

## 2012-08-28 DIAGNOSIS — D47Z2 Castleman disease: Secondary | ICD-10-CM

## 2012-08-28 DIAGNOSIS — R599 Enlarged lymph nodes, unspecified: Secondary | ICD-10-CM

## 2012-08-28 DIAGNOSIS — Z5112 Encounter for antineoplastic immunotherapy: Secondary | ICD-10-CM

## 2012-08-28 LAB — COMPREHENSIVE METABOLIC PANEL (CC13)
ALT: 14 U/L (ref 0–55)
Alkaline Phosphatase: 116 U/L (ref 40–150)
Potassium: 4.1 mEq/L (ref 3.5–5.1)
Sodium: 138 mEq/L (ref 136–145)
Total Bilirubin: 0.3 mg/dL (ref 0.20–1.20)
Total Protein: 7.2 g/dL (ref 6.4–8.3)

## 2012-08-28 LAB — CBC WITH DIFFERENTIAL/PLATELET
EOS%: 2.7 % (ref 0.0–7.0)
Eosinophils Absolute: 0.3 10*3/uL (ref 0.0–0.5)
MCH: 31.1 pg (ref 25.1–34.0)
MCV: 90.5 fL (ref 79.5–101.0)
MONO%: 9.4 % (ref 0.0–14.0)
NEUT#: 6.8 10*3/uL — ABNORMAL HIGH (ref 1.5–6.5)
RBC: 4.53 10*6/uL (ref 3.70–5.45)
RDW: 12.3 % (ref 11.2–14.5)
lymph#: 2.1 10*3/uL (ref 0.9–3.3)
nRBC: 0 % (ref 0–0)

## 2012-08-28 MED ORDER — HEPARIN SOD (PORK) LOCK FLUSH 100 UNIT/ML IV SOLN
500.0000 [IU] | Freq: Once | INTRAVENOUS | Status: AC | PRN
  Administered 2012-08-28: 500 [IU]
  Filled 2012-08-28: qty 5

## 2012-08-28 MED ORDER — SODIUM CHLORIDE 0.9 % IJ SOLN
10.0000 mL | INTRAMUSCULAR | Status: DC | PRN
  Administered 2012-08-28: 10 mL
  Filled 2012-08-28: qty 10

## 2012-08-28 MED ORDER — SODIUM CHLORIDE 0.9 % IV SOLN
Freq: Once | INTRAVENOUS | Status: AC
  Administered 2012-08-28: 11:00:00 via INTRAVENOUS

## 2012-08-28 MED ORDER — METHYLPREDNISOLONE SODIUM SUCC 125 MG IJ SOLR
125.0000 mg | Freq: Once | INTRAMUSCULAR | Status: AC
  Administered 2012-08-28: 125 mg via INTRAVENOUS

## 2012-08-28 MED ORDER — DIPHENHYDRAMINE HCL 25 MG PO CAPS
50.0000 mg | ORAL_CAPSULE | Freq: Once | ORAL | Status: AC
  Administered 2012-08-28: 50 mg via ORAL

## 2012-08-28 MED ORDER — LORAZEPAM 2 MG/ML IJ SOLN
1.0000 mg | Freq: Once | INTRAMUSCULAR | Status: AC
  Administered 2012-08-28: 1 mg via INTRAVENOUS

## 2012-08-28 MED ORDER — SODIUM CHLORIDE 0.9 % IV SOLN
375.0000 mg/m2 | Freq: Once | INTRAVENOUS | Status: AC
  Administered 2012-08-28: 700 mg via INTRAVENOUS
  Filled 2012-08-28: qty 70

## 2012-08-28 MED ORDER — ACETAMINOPHEN 325 MG PO TABS
650.0000 mg | ORAL_TABLET | Freq: Once | ORAL | Status: AC
  Administered 2012-08-28: 650 mg via ORAL

## 2012-08-28 NOTE — Telephone Encounter (Signed)
Per staff message and POF I have scheduled appts.  JMW  

## 2012-08-28 NOTE — Patient Instructions (Addendum)
Continue with your maintenance Rituxan as scheduled Follow up with Dr. Arbutus Ped in 2 months prior to your next scheduled cycle of Maintenance Rituxan with a restaging CT scan of your chest, abdomen and pelvis to re-evaluate your disease

## 2012-08-28 NOTE — Telephone Encounter (Signed)
appts made and printed for pt,pt aware that she will get a call with her scan appt and contrast was given,email to mw to add chemo and pt is aware

## 2012-08-28 NOTE — Progress Notes (Signed)
@  1202-Rituxan started at 50 mg/hr=23 cc/hr X 45 cc @1310  Rituxan rate increased to 100 mg/hr= 45 cc/hr x 90 cc @1355  Rituxan rate increased to 200 mg/hr = 91 cc/hr X 45 cc @1425  Rituxan rate increased to 250 mg/hr = 113 cc/hr x 56 cc @1500  Rituxan rate increased to 300 mg/hr = 136 cc/hr X 68 cc @1535  Rituxan rate increased to 350 mg/hr = 159 cc/hr till end of infusion. @1625  Rituxan completed without adverse event.

## 2012-08-28 NOTE — Progress Notes (Signed)
[facility] Telephone:([phone]   Fax:([phone]  OFFICE PROGRESS NOTE  [name], MD [address] [facility], [place]. [place] [address]  DIAGNOSIS:  1. Castleman's Disease  2.Idiopathic Thrombocytopenic Purpura  3. 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  2) Weekly rituximab at 375 mg per meter squared with first cycle given in the hospital. Now status post 2 partial cycles and 7 complete cycles.   CURRENT THERAPY: The patient will start tomorrow the first dose of maintenance Rituxan 375 mg/M2 every 2 months, status post 5 cycles.  INTERVAL HISTORY: [name] 57 y.o. female returns to the clinic today for routine followup visit. The patient is feeling fine today with no specific complaints. She denied having any significant weight loss or night sweats. She has no significant chest pain, shortness breath, cough or hemoptysis. The patient is tolerating her treatment with maintenance Rituxan fairly well with no significant adverse effects except for mild fatigue a few days after the infusion. She was contemplating having her Port-A-Cath removed and taking a break from maintenance chemotherapy. This was not a course of action that was advised by Dr. Arbutus Ped. Patient has opted to keep her Port-A-Cath and continue with maintenance Rituxan as scheduled.  MEDICAL HISTORY: Past Medical History  Diagnosis Date  . Hypothyroidism   . Heart attack 2007  . Chronic kidney disease     failure  . Heart attack 2007  . Retroperitoneal lymphadenopathy 2012  . Castleman's disease   . ITP (idiopathic thrombocytopenic purpura) 06/17/2011    ALLERGIES:  is allergic to cefdinir; levofloxacin; and vicodin.  MEDICATIONS:  Current Outpatient Prescriptions  Medication Sig Dispense Refill  . ALPRAZolam  (XANAX) 0.25 MG tablet Take 0.25 mg by mouth at bedtime as needed. For sleep      . Hypromellose (GENTEAL OP) Apply 1 drop to eye daily.        Marland Kitchen levothyroxine (SYNTHROID, LEVOTHROID) 112 MCG tablet Take 112 mcg by mouth daily.      . metoprolol succinate (TOPROL-XL) 25 MG 24 hr tablet Take 50 mg by mouth 2 (two) times daily.       . RiTUXimab (RITUXAN IV) Inject 1 application into the vein once a week. Through port in chest on Wednesdays at [facility] [address] [place]        No current facility-administered medications for this visit.   Facility-Administered Medications Ordered in Other Visits  Medication Dose Route Frequency Provider Last Rate Last Dose  . sodium chloride 0.9 % injection 10 mL  10 mL Intracatheter PRN Si Gaul, MD        SURGICAL HISTORY:  Past Surgical History  Procedure Date  . Cesarean section 1983 1989  . Appendectomy     57 years old    REVIEW OF SYSTEMS:  A comprehensive review of systems was negative.   PHYSICAL EXAMINATION: General appearance: alert, cooperative and no distress Head: Normocephalic, without obvious abnormality, atraumatic Neck: no adenopathy Lymph nodes: Cervical, supraclavicular, and axillary nodes normal. Resp: clear to auscultation bilaterally Cardio: regular rate and rhythm, S1, S2 normal, no murmur, click, rub or gallop GI: soft, non-tender; bowel sounds normal; no masses,  no organomegaly Extremities: extremities normal, atraumatic, no cyanosis or edema  ECOG PERFORMANCE STATUS: 0 - Asymptomatic  Blood pressure 132/84, pulse 63, temperature 97 F (36.1 C), temperature source  Oral, resp. rate 20, height 5' 4.5" (1.638 m), weight 183 lb 1.6 oz (83.054 kg).  LABORATORY DATA: Lab Results  Component Value Date   WBC 10.1 08/28/2012   HGB 14.1 08/28/2012   HCT 41.0 08/28/2012   MCV 90.5 08/28/2012   PLT 343 08/28/2012      Chemistry      Component Value Date/Time   NA 139 06/29/2012 0841   NA 138 02/28/2012  0840   NA 143 12/13/2010 0957   K 4.6 06/29/2012 0841   K 4.7 02/28/2012 0840   K 4.3 12/13/2010 0957   CL 105 06/29/2012 0841   CL 107 02/28/2012 0840   CL 99 12/13/2010 0957   CO2 24 06/29/2012 0841   CO2 22 02/28/2012 0840   CO2 26 12/13/2010 0957   BUN 15.0 06/29/2012 0841   BUN 14 02/28/2012 0840   BUN 8 12/13/2010 0957   CREATININE 0.8 06/29/2012 0841   CREATININE 0.61 02/28/2012 0840   CREATININE 1.16* 05/31/2011 1530   CREATININE 0.7 12/13/2010 0957      Component Value Date/Time   CALCIUM 9.1 06/29/2012 0841   CALCIUM 9.4 02/28/2012 0840   CALCIUM 8.9 12/13/2010 0957   ALKPHOS 121 06/29/2012 0841   ALKPHOS 101 02/28/2012 0840   ALKPHOS 94* 12/13/2010 0957   AST 17 06/29/2012 0841   AST 15 02/28/2012 0840   AST 21 12/13/2010 0957   ALT 17 06/29/2012 0841   ALT 11 02/28/2012 0840   BILITOT 0.33 06/29/2012 0841   BILITOT 0.3 02/28/2012 0840   BILITOT 0.50 12/13/2010 0957       RADIOGRAPHIC STUDIES: Ct Chest W Contrast  04/26/2012  *RADIOLOGY REPORT*  Clinical Data:  Castleman's disease restaging.  Chemotherapy in progress.  Short of breath.  CT CHEST, ABDOMEN AND PELVIS WITH CONTRAST  Technique:  Multidetector CT imaging of the chest, abdomen and pelvis was performed following the standard protocol during bolus administration of intravenous contrast.  Contrast: OMNIPAQUE IOHEXOL 300 MG/ML  SOLN  Comparison:  CT scan 09/02/2011   CT CHEST  Findings:  Port in the right chest wall.  Small bilateral axillary lymph nodes are decreased in size compared to prior.  For example 9 mm right axillary lymph node is decreased from 10 mm on prior. Adjacent 6 mm lymph node decreased from 9 mm on prior (image 12). Similar findings in the left axilla.  No mediastinal or hilar lymphadenopathy.  No pericardial fluid.  Esophagus is normal.  Review of the lung parenchyma demonstrates no new or suspicious pulmonary nodules.  IMPRESSION: 1.  Decrease in size of axillary lymph nodes which are now less than  10 mm short axis. 2.  No evidence of progression of lymphadenopathy.   CT ABDOMEN AND PELVIS  Findings:  No focal hepatic lesion.  The gallbladder  has small stones.  The pancreas, spleen, adrenal glands, and kidneys are normal. The spleen is reduced in volume and now  normal in size.  There is small left periaortic lymph nodes which are decreased in size compared to prior.  For example,  11 mm left periaortic lymph node (image 70) is decreased from the 16 mm short axis on prior. Lymph node at the left common iliac artery measures 8 mm (image 78) decreased from 10 mm on prior.  No new retroperitoneal or pelvic lymphadenopathy.  Superficial right external iliac lymph node measures 9 mm (image 101)  decreased from 12 mm on prior  The stomach, small bowel, and colon are  normal.  No free fluid in the pelvis.  The uterus and ovaries and bladder are normal. Review of  bone windows demonstrates no aggressive osseous lesions.  Stable small sclerotic lesion in the right iliac bone appears benign.  IMPRESSION:  1.  Interval reduction in periaortic and iliac lymphadenopathy. 2.   Continued reduction in splenic volume which is now normal.   Original Report Authenticated By: Genevive Bi, M.D.     ASSESSMENT/PLAN: This is a very pleasant 57 years old white female with history of Castleman disease in addition to ITP currently on maintenance Rituxan every 2 months is status post 5 cycles with significant improvement in her lymphadenopathy. The patient was discussed with Dr. Arbutus Ped. She'll proceed with cycle #6 of her maintenance Rituxan today as scheduled. She will followup with Dr. Arbutus Ped in 2 months with repeat CBC differential, C. met  prior to cycle #7 of her maintenance Rituxan with a restaging CT scan of her chest, abdomen and pelvis with contrast to reevaluate her disease.   Conni Slipper, PA-C  The patient knows to call the clinic with any problems, questions or concerns. We can certainly see the patient  much sooner if necessary.  I spent 20 minutes counseling the patient face to face. The total time spent in the appointment was 30 minutes.

## 2012-09-24 ENCOUNTER — Encounter: Payer: Self-pay | Admitting: Internal Medicine

## 2012-09-24 NOTE — Progress Notes (Signed)
Patient called in to see about assistance. She said was denied prior but she has rare disease and wanted to know if any assistance is out there for her. She said she got letter from [employer]..possible outside collection agency and really upset. I advised she needed to speak with billing direct. I am sending her an app for assistance and verified her address. I advised either way she is welcome to reach out to social worker to see if anything out there to assist her.

## 2012-10-10 ENCOUNTER — Encounter: Payer: Self-pay | Admitting: *Deleted

## 2012-10-10 NOTE — Progress Notes (Signed)
[facility] Clinical Social Work  Clinical Social Work received call from patient concerned about how she was going to pay her medical bills. She states she has recently been contacted by the hospital lawyer and is trying her best to pay her bills.  She states she is overqualified for assistance from the hospital.  [name] has a rare disease and CSW is unaware of any funds she may qualify for.  CSW advised patient to call the [employer] and [employer].  CSW also suggested patient review raredisease.org for any other forms of assistance.  CSW allowed patient to express anxiety and frustration surrounding her financial situation and provided brief emotional support.    Kathrin Penner, MSW, LCSW Clinical Social Worker [facility] [phone]

## 2012-10-19 ENCOUNTER — Encounter (HOSPITAL_COMMUNITY): Payer: Self-pay

## 2012-10-19 ENCOUNTER — Ambulatory Visit (HOSPITAL_COMMUNITY)
Admission: RE | Admit: 2012-10-19 | Discharge: 2012-10-19 | Disposition: A | Discharge status: Home / Self Care | Payer: BC Managed Care – PPO | Source: Ambulatory Visit | Attending: Physician Assistant | Admitting: Physician Assistant

## 2012-10-19 DIAGNOSIS — R599 Enlarged lymph nodes, unspecified: Secondary | ICD-10-CM | POA: Insufficient documentation

## 2012-10-19 DIAGNOSIS — D47Z2 Castleman disease: Secondary | ICD-10-CM

## 2012-10-19 MED ORDER — IOHEXOL 300 MG/ML  SOLN
100.0000 mL | Freq: Once | INTRAMUSCULAR | Status: AC | PRN
  Administered 2012-10-19: 100 mL via INTRAVENOUS

## 2012-10-23 ENCOUNTER — Other Ambulatory Visit (HOSPITAL_BASED_OUTPATIENT_CLINIC_OR_DEPARTMENT_OTHER): Payer: BC Managed Care – PPO | Admitting: Lab

## 2012-10-23 ENCOUNTER — Ambulatory Visit (HOSPITAL_BASED_OUTPATIENT_CLINIC_OR_DEPARTMENT_OTHER): Payer: BC Managed Care – PPO | Admitting: Internal Medicine

## 2012-10-23 ENCOUNTER — Ambulatory Visit (HOSPITAL_BASED_OUTPATIENT_CLINIC_OR_DEPARTMENT_OTHER): Payer: BC Managed Care – PPO

## 2012-10-23 ENCOUNTER — Encounter: Payer: Self-pay | Admitting: Internal Medicine

## 2012-10-23 ENCOUNTER — Other Ambulatory Visit: Payer: Self-pay | Admitting: Medical Oncology

## 2012-10-23 VITALS — BP 139/95 | HR 84 | Temp 97.9°F | Resp 20 | Ht 64.5 in | Wt 184.1 lb

## 2012-10-23 VITALS — BP 110/69 | HR 83 | Temp 97.6°F | Resp 16

## 2012-10-23 DIAGNOSIS — D47Z2 Castleman disease: Secondary | ICD-10-CM

## 2012-10-23 DIAGNOSIS — Z95828 Presence of other vascular implants and grafts: Secondary | ICD-10-CM

## 2012-10-23 DIAGNOSIS — R599 Enlarged lymph nodes, unspecified: Secondary | ICD-10-CM

## 2012-10-23 DIAGNOSIS — Z5112 Encounter for antineoplastic immunotherapy: Secondary | ICD-10-CM

## 2012-10-23 LAB — COMPREHENSIVE METABOLIC PANEL (CC13)
Albumin: 4.1 g/dL (ref 3.5–5.0)
Alkaline Phosphatase: 125 U/L (ref 40–150)
BUN: 7.2 mg/dL (ref 7.0–26.0)
CO2: 25 mEq/L (ref 22–29)
Glucose: 99 mg/dl (ref 70–99)
Total Bilirubin: 0.23 mg/dL (ref 0.20–1.20)

## 2012-10-23 LAB — CBC WITH DIFFERENTIAL/PLATELET
BASO%: 0.6 % (ref 0.0–2.0)
EOS%: 3.4 % (ref 0.0–7.0)
LYMPH%: 23.5 % (ref 14.0–49.7)
MCH: 31 pg (ref 25.1–34.0)
MCHC: 34.4 g/dL (ref 31.5–36.0)
MCV: 90.2 fL (ref 79.5–101.0)
MONO%: 9.3 % (ref 0.0–14.0)
Platelets: 336 10*3/uL (ref 145–400)
RBC: 4.71 10*6/uL (ref 3.70–5.45)
nRBC: 0 % (ref 0–0)

## 2012-10-23 LAB — LACTATE DEHYDROGENASE (CC13): LDH: 170 U/L (ref 125–245)

## 2012-10-23 MED ORDER — SODIUM CHLORIDE 0.9 % IV SOLN
Freq: Once | INTRAVENOUS | Status: AC
  Administered 2012-10-23: 10:00:00 via INTRAVENOUS

## 2012-10-23 MED ORDER — METHYLPREDNISOLONE SODIUM SUCC 125 MG IJ SOLR
125.0000 mg | Freq: Once | INTRAMUSCULAR | Status: AC
  Administered 2012-10-23: 125 mg via INTRAVENOUS

## 2012-10-23 MED ORDER — SODIUM CHLORIDE 0.9 % IJ SOLN
10.0000 mL | INTRAMUSCULAR | Status: DC | PRN
  Administered 2012-10-23: 10 mL
  Filled 2012-10-23: qty 10

## 2012-10-23 MED ORDER — DIPHENHYDRAMINE HCL 25 MG PO CAPS
50.0000 mg | ORAL_CAPSULE | Freq: Once | ORAL | Status: AC
  Administered 2012-10-23: 50 mg via ORAL

## 2012-10-23 MED ORDER — LORAZEPAM 2 MG/ML IJ SOLN
1.0000 mg | Freq: Once | INTRAMUSCULAR | Status: AC
  Administered 2012-10-23: 1 mg via INTRAVENOUS

## 2012-10-23 MED ORDER — RITUXIMAB CHEMO INJECTION 10 MG/ML
375.0000 mg/m2 | Freq: Once | INTRAVENOUS | Status: AC
  Administered 2012-10-23: 700 mg via INTRAVENOUS
  Filled 2012-10-23: qty 70

## 2012-10-23 MED ORDER — ACETAMINOPHEN 325 MG PO TABS
650.0000 mg | ORAL_TABLET | Freq: Once | ORAL | Status: AC
  Administered 2012-10-23: 650 mg via ORAL

## 2012-10-23 MED ORDER — HEPARIN SOD (PORK) LOCK FLUSH 100 UNIT/ML IV SOLN
500.0000 [IU] | Freq: Once | INTRAVENOUS | Status: AC | PRN
  Administered 2012-10-23: 500 [IU]
  Filled 2012-10-23: qty 5

## 2012-10-23 NOTE — Patient Instructions (Signed)
No evidence for disease progression on your recent scan. Followup visit in 3 months

## 2012-10-23 NOTE — Patient Instructions (Addendum)
[facility] Discharge Instructions for Patients Receiving Chemotherapy  Today you received the following chemotherapy agents Rituxan.  To help prevent nausea and vomiting after your treatment, we encourage you to take your nausea medication as prescribed.    If you develop nausea and vomiting that is not controlled by your nausea medication, call the clinic. If it is after clinic hours your family physician or the after hours number for the clinic or go to the Emergency Department.   BELOW ARE SYMPTOMS THAT SHOULD BE REPORTED IMMEDIATELY:  *FEVER GREATER THAN 100.5 F  *CHILLS WITH OR WITHOUT FEVER  NAUSEA AND VOMITING THAT IS NOT CONTROLLED WITH YOUR NAUSEA MEDICATION  *UNUSUAL SHORTNESS OF BREATH  *UNUSUAL BRUISING OR BLEEDING  TENDERNESS IN MOUTH AND THROAT WITH OR WITHOUT PRESENCE OF ULCERS  *URINARY PROBLEMS  *BOWEL PROBLEMS  UNUSUAL RASH Items with * indicate a potential emergency and should be followed up as soon as possible.   Please let the nurse know about any problems that you may have experienced. Feel free to call the clinic you have any questions or concerns. The clinic phone number is [phone].   I have been informed and understand all the instructions given to me. I know to contact the clinic, my physician, or go to the Emergency Department if any problems should occur. I do not have any questions at this time, but understand that I may call the clinic during office hours   should I have any questions or need assistance in obtaining follow up care.    __________________________________________  _____________  __________ Signature of Patient or Authorized Representative            Date                   Time    __________________________________________ Nurse's Signature

## 2012-10-23 NOTE — Progress Notes (Signed)
[facility] [phone]   Fax:([phone]  OFFICE PROGRESS NOTE  [name], MD [address] [facility], [place]. [place] [address]  DIAGNOSIS:  1. Castleman's Disease  2.Idiopathic Thrombocytopenic Purpura  3. 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  2) Weekly rituximab at 375 mg per meter squared with first cycle given in the hospital. Now status post 2 partial cycles and 7 complete cycles.   CURRENT THERAPY: Maintenance Rituxan 375 mg/M2 every 2 months, status post 6 cycles.  INTERVAL HISTORY: [name] 57 y.o. female returns to the clinic today for followup visit. The patient is feeling fine today with no specific complaints. She is concerned about having her port a cath in place because it's limiting her activity and she wanted it to be removed. She contacted a Castleman's disease expert in [place] and she would like to go there for a second opinion in the summer. The patient is tolerating her maintenance treatment with Rituxan fairly well except for mild fatigue. She denied having any significant weight loss or night sweats. She denied having any chest pain, shortness of breath, cough or hemoptysis. She had repeat CT scan of the chest, abdomen and pelvis performed recently and she is here for evaluation and discussion of her scan results.  MEDICAL HISTORY: Past Medical History  Diagnosis Date  . Hypothyroidism   . Heart attack 2007  . Chronic kidney disease     failure  . Heart attack 2007  . Retroperitoneal lymphadenopathy 2012  . Castleman's disease   . ITP (idiopathic thrombocytopenic purpura) 06/17/2011    ALLERGIES:  is allergic to cefdinir; levofloxacin; and vicodin.  MEDICATIONS:  Current Outpatient Prescriptions  Medication Sig Dispense Refill  . ALPRAZolam  (XANAX) 0.25 MG tablet Take 0.25 mg by mouth at bedtime as needed. For sleep      . levothyroxine (SYNTHROID, LEVOTHROID) 112 MCG tablet Take 112 mcg by mouth daily.      . metoprolol succinate (TOPROL-XL) 25 MG 24 hr tablet Take 50 mg by mouth 2 (two) times daily.       . RiTUXimab (RITUXAN IV) Inject 1 application into the vein once a week. Through port in chest on Wednesdays at [facility], [address] [place]        No current facility-administered medications for this visit.   Facility-Administered Medications Ordered in Other Visits  Medication Dose Route Frequency Provider Last Rate Last Dose  . sodium chloride 0.9 % injection 10 mL  10 mL Intracatheter PRN Si Gaul, MD        SURGICAL HISTORY:  Past Surgical History  Procedure Laterality Date  . Cesarean section  1983 1989  . Appendectomy      57 years old    REVIEW OF SYSTEMS:  A comprehensive review of systems was negative.   PHYSICAL EXAMINATION: General appearance: alert, cooperative and no distress Head: Normocephalic, without obvious abnormality, atraumatic Neck: no adenopathy Lymph nodes: Cervical, supraclavicular, and axillary nodes normal. Resp: clear to auscultation bilaterally Cardio: regular rate and rhythm, S1, S2 normal, no murmur, click, rub or gallop GI: soft, non-tender; bowel sounds normal; no masses,  no organomegaly Extremities: extremities normal, atraumatic, no cyanosis or edema Neurologic: Alert and oriented X 3, normal strength and tone. Normal symmetric reflexes. Normal coordination and gait  ECOG PERFORMANCE STATUS: 0 - Asymptomatic  There were no vitals taken for this visit.  LABORATORY DATA: Lab Results  Component Value Date   WBC 8.0 10/23/2012   HGB 14.6 10/23/2012   HCT 42.5 10/23/2012   MCV 90.2 10/23/2012   PLT 336 10/23/2012      Chemistry      Component Value Date/Time   NA 138 08/28/2012 0842   NA 138 02/28/2012 0840   NA 143 12/13/2010 0957   K 4.1 08/28/2012 0842     K 4.7 02/28/2012 0840   K 4.3 12/13/2010 0957   CL 105 08/28/2012 0842   CL 107 02/28/2012 0840   CL 99 12/13/2010 0957   CO2 25 08/28/2012 0842   CO2 22 02/28/2012 0840   CO2 26 12/13/2010 0957   BUN 14.8 08/28/2012 0842   BUN 14 02/28/2012 0840   BUN 8 12/13/2010 0957   CREATININE 0.8 08/28/2012 0842   CREATININE 0.61 02/28/2012 0840   CREATININE 1.16* 05/31/2011 1530   CREATININE 0.7 12/13/2010 0957      Component Value Date/Time   CALCIUM 9.4 08/28/2012 0842   CALCIUM 9.4 02/28/2012 0840   CALCIUM 8.9 12/13/2010 0957   ALKPHOS 116 08/28/2012 0842   ALKPHOS 101 02/28/2012 0840   ALKPHOS 94* 12/13/2010 0957   AST 13 08/28/2012 0842   AST 15 02/28/2012 0840   AST 21 12/13/2010 0957   ALT 14 08/28/2012 0842   ALT 11 02/28/2012 0840   BILITOT 0.30 08/28/2012 0842   BILITOT 0.3 02/28/2012 0840   BILITOT 0.50 12/13/2010 0957       RADIOGRAPHIC STUDIES: Ct Chest W Contrast  10/19/2012  *RADIOLOGY REPORT*  Clinical Data:  Castleman's disease.  CT CHEST, ABDOMEN AND PELVIS WITH CONTRAST  Technique:  Multidetector CT imaging of the chest, abdomen and pelvis was performed following the standard protocol during bolus administration of intravenous contrast.  Contrast: OMNIPAQUE IOHEXOL 300 MG/ML  SOLN  Comparison:  Multiple prior CT scans.  The most recent is 04/26/2012.   CT CHEST  Findings:  The right Port-A-Cath is stable.  Continued decrease in size of the axillary lymph nodes.  There are several small normal sized nodes bilaterally.  No adenopathy.  No supraclavicular adenopathy.  The bony thorax is intact.  No destructive bone lesions or spinal canal compromise.  The heart is normal in size.  No pericardial effusion.  No mediastinal or hilar lymphadenopathy.  The aorta is normal in caliber.  The major branch vessels are normal.  Coronary artery calcifications are noted.  The esophagus is normal.  Examination of the lung parenchyma demonstrates no acute pulmonary findings.  No pulmonary nodules or  pleural effusions.  Stable mild emphysematous changes.  IMPRESSION:  1.  Small scattered normal sized axillary lymph nodes.  Findings suggest an excellent response to therapy. 2.  No mediastinal or hilar lymphadenopathy. 3.  Emphysematous changes but no acute pulmonary findings.    CT ABDOMEN AND PELVIS  Findings:  The solid abdominal organs are normal and stable.  Small gallstones are again noted in the gallbladder.  No common bile duct dilatation.  The stomach, duodenum, small bowel and colon are unremarkable.  No inflammatory changes or mass lesions.  No mesenteric or retroperitoneal mass.  Small scattered retroperitoneal lymph nodes are again demonstrated.  The largest node on the prior study located adjacent to the left side of the aorta measured 15.5 x 11.5 mm.  This now measures 13.5 x 11.5 mm.  The lymph node just posterior to the left  renal vein measures 11.0 x 7.5 mm and previously measured 13.5 x 9.5 mm.  Small stable scattered iliac nodes are noted.  No enlarged pelvic lymph nodes or inguinal lymph nodes.  The uterus and ovaries are unremarkable and stable.  The bladder is normal.  No free pelvic fluid collections.  The bony pelvis is intact.  IMPRESSION:  Stable to slightly smaller retroperitoneal lymph nodes.  No new adenopathy.   Original Report Authenticated By: Rudie Meyer, M.D.    ASSESSMENT: This is a very pleasant 56 years old white female with history of Castleman disease currently on maintenance Rituxan every 2 months. The patient is tolerating her treatment fairly well with no significant adverse effects except for mild fatigue. Her CT scan showed continuous improvement in her disease.  PLAN: I have a lengthy discussion with the patient today about her condition. She had a lot of questions and I answered them completely to their satisfaction today regarding her prognosis and referral to the Castleman disease expert in [place]. I explained to the patient that has been happy to make  arrangements for her visit for a second opinion. We'll proceed with Rituxan today as scheduled. I will change her maintenance is scheduled to be every 3 months based on her request. I will also arrange for the patient to have the Port-A-Cath removed based on her request. She would come back for followup visit in 3 months for reevaluation and the next cycle of her treatment. She was advised to call immediately if she has any concerning symptoms in the interval.  All questions were answered. The patient knows to call the clinic with any problems, questions or concerns. We can certainly see the patient much sooner if necessary.  I spent 20 minutes counseling the patient face to face. The total time spent in the appointment was 30 minutes.

## 2012-10-24 ENCOUNTER — Encounter (HOSPITAL_COMMUNITY): Payer: Self-pay | Admitting: Pharmacy Technician

## 2012-10-24 ENCOUNTER — Telehealth: Payer: Self-pay | Admitting: Internal Medicine

## 2012-10-26 ENCOUNTER — Other Ambulatory Visit: Payer: Self-pay | Admitting: Radiology

## 2012-10-29 ENCOUNTER — Encounter (HOSPITAL_COMMUNITY): Payer: Self-pay

## 2012-10-29 ENCOUNTER — Ambulatory Visit (HOSPITAL_COMMUNITY)
Admission: RE | Admit: 2012-10-29 | Discharge: 2012-10-29 | Disposition: A | Discharge status: Home / Self Care | Payer: BC Managed Care – PPO | Source: Ambulatory Visit | Attending: Internal Medicine | Admitting: Internal Medicine

## 2012-10-29 DIAGNOSIS — R599 Enlarged lymph nodes, unspecified: Secondary | ICD-10-CM | POA: Insufficient documentation

## 2012-10-29 DIAGNOSIS — I252 Old myocardial infarction: Secondary | ICD-10-CM | POA: Insufficient documentation

## 2012-10-29 DIAGNOSIS — Z95828 Presence of other vascular implants and grafts: Secondary | ICD-10-CM

## 2012-10-29 DIAGNOSIS — F17200 Nicotine dependence, unspecified, uncomplicated: Secondary | ICD-10-CM | POA: Insufficient documentation

## 2012-10-29 DIAGNOSIS — N189 Chronic kidney disease, unspecified: Secondary | ICD-10-CM | POA: Insufficient documentation

## 2012-10-29 DIAGNOSIS — Z79899 Other long term (current) drug therapy: Secondary | ICD-10-CM | POA: Insufficient documentation

## 2012-10-29 DIAGNOSIS — Z452 Encounter for adjustment and management of vascular access device: Secondary | ICD-10-CM | POA: Insufficient documentation

## 2012-10-29 DIAGNOSIS — E039 Hypothyroidism, unspecified: Secondary | ICD-10-CM | POA: Insufficient documentation

## 2012-10-29 LAB — CBC WITH DIFFERENTIAL/PLATELET
Basophils Absolute: 0.1 10*3/uL (ref 0.0–0.1)
Basophils Relative: 1 % (ref 0–1)
Eosinophils Relative: 4 % (ref 0–5)
HCT: 50.4 % — ABNORMAL HIGH (ref 36.0–46.0)
Hemoglobin: 14.9 g/dL (ref 12.0–15.0)
MCH: 31.8 pg (ref 26.0–34.0)
MCHC: 29.6 g/dL — ABNORMAL LOW (ref 30.0–36.0)
MCV: 107.7 fL — ABNORMAL HIGH (ref 78.0–100.0)
Monocytes Absolute: 0.9 10*3/uL (ref 0.1–1.0)
Monocytes Relative: 10 % (ref 3–12)
Neutro Abs: 5.3 10*3/uL (ref 1.7–7.7)
RDW: 13.3 % (ref 11.5–15.5)

## 2012-10-29 MED ORDER — SODIUM CHLORIDE 0.9 % IV SOLN
INTRAVENOUS | Status: DC
  Administered 2012-10-29: 07:00:00 via INTRAVENOUS

## 2012-10-29 MED ORDER — CEFAZOLIN SODIUM-DEXTROSE 2-3 GM-% IV SOLR
2.0000 g | Freq: Once | INTRAVENOUS | Status: AC
  Administered 2012-10-29: 2 g via INTRAVENOUS
  Filled 2012-10-29: qty 50

## 2012-10-29 MED ORDER — FENTANYL CITRATE 0.05 MG/ML IJ SOLN
INTRAMUSCULAR | Status: AC
  Filled 2012-10-29: qty 4

## 2012-10-29 MED ORDER — MIDAZOLAM HCL 2 MG/2ML IJ SOLN
INTRAMUSCULAR | Status: AC
  Filled 2012-10-29: qty 4

## 2012-10-29 MED ORDER — MIDAZOLAM HCL 2 MG/2ML IJ SOLN
INTRAMUSCULAR | Status: AC | PRN
  Administered 2012-10-29: 1 mg via INTRAVENOUS

## 2012-10-29 MED ORDER — FENTANYL CITRATE 0.05 MG/ML IJ SOLN
INTRAMUSCULAR | Status: AC | PRN
  Administered 2012-10-29: 100 ug via INTRAVENOUS

## 2012-10-29 MED ORDER — LIDOCAINE HCL 1 % IJ SOLN
INTRAMUSCULAR | Status: AC
  Filled 2012-10-29: qty 20

## 2012-10-29 NOTE — H&P (Signed)
Agree 

## 2012-10-29 NOTE — Procedures (Signed)
Procedure:  Porta-cath removal Findings:  Removal of port under local anesthesia and sedation Entire catheter removed.

## 2012-10-29 NOTE — H&P (Signed)
[name] is an 57 y.o. female.   Chief Complaint: "I'm here to get my port out" HPI: Patient with history of Castleman's disease requests removal of port a cath today.  Past Medical History  Diagnosis Date  . Hypothyroidism   . Heart attack 2007  . Chronic kidney disease     failure  . Heart attack 2007  . Retroperitoneal lymphadenopathy 2012  . Castleman's disease   . ITP (idiopathic thrombocytopenic purpura) 06/17/2011    Past Surgical History  Procedure Laterality Date  . Cesarean section  1983 1989  . Appendectomy      57 years old    Family History  Problem Relation Age of Onset  . Cancer Mother     lung  . Heart disease Father    Social History:  reports that she has been smoking.  She uses smokeless tobacco. She reports that  drinks alcohol. She reports that she does not use illicit drugs.  Allergies:  Allergies  Allergen Reactions  . Cefdinir Diarrhea  . Levofloxacin Itching  . Vicodin (Hydrocodone-Acetaminophen) Itching    Current outpatient prescriptions:ALPRAZolam (XANAX) 0.25 MG tablet, Take 0.25 mg by mouth at bedtime as needed. For sleep, Disp: , Rfl: ;  ibuprofen (ADVIL,MOTRIN) 200 MG tablet, Take 400 mg by mouth every 6 (six) hours as needed for pain or headache., Disp: , Rfl: ;  levothyroxine (SYNTHROID, LEVOTHROID) 112 MCG tablet, Take 112 mcg by mouth daily before breakfast. , Disp: , Rfl:  metoprolol succinate (TOPROL-XL) 25 MG 24 hr tablet, Take 50 mg by mouth 2 (two) times daily. , Disp: , Rfl: ;  RiTUXimab (RITUXAN IV), Inject 1 application into the vein every 8 (eight) weeks. Through port in chest  at [facility] [address] [place] last dose was 10/23/12, Disp: , Rfl:  Current facility-administered medications:0.9 %  sodium chloride infusion, , Intravenous, Continuous, Brayton El, PA-C, Last Rate: 20 mL/hr at 10/29/12 0715;  ceFAZolin (ANCEF) IVPB 2 g/50 mL premix, 2 g, Intravenous, Once, [employer], PA-C;  fentaNYL (SUBLIMAZE) 0.05  MG/ML injection, , , , ;  lidocaine (XYLOCAINE) 1 % (with pres) injection, , , , ;  midazolam (VERSED) 2 MG/2ML injection, , , ,  Facility-Administered Medications Ordered in Other Encounters: sodium chloride 0.9 % injection 10 mL, 10 mL, Intracatheter, PRN, Si Gaul, MD   Results for orders placed during the hospital encounter of 10/29/12 (from the past 48 hour(s))  CBC WITH DIFFERENTIAL     Status: Abnormal   Collection Time    10/29/12  7:00 AM      Result Value Range   WBC 8.9  4.0 - 10.5 K/uL   RBC 4.68  3.87 - 5.11 MIL/uL   Hemoglobin 14.9  12.0 - 15.0 g/dL   HCT 16.1 (*) 09.6 - 04.5 %   MCV 107.7 (*) 78.0 - 100.0 fL   MCH 31.8  26.0 - 34.0 pg   MCHC 29.6 (*) 30.0 - 36.0 g/dL   RDW 40.9  81.1 - 91.4 %   Platelets 345  150 - 400 K/uL   Neutrophils Relative 60  43 - 77 %   Neutro Abs 5.3  1.7 - 7.7 K/uL   Lymphocytes Relative 25  12 - 46 %   Lymphs Abs 2.2  0.7 - 4.0 K/uL   Monocytes Relative 10  3 - 12 %   Monocytes Absolute 0.9  0.1 - 1.0 K/uL   Eosinophils Relative 4  0 - 5 %   Eosinophils  Absolute 0.4  0.0 - 0.7 K/uL   Basophils Relative 1  0 - 1 %   Basophils Absolute 0.1  0.0 - 0.1 K/uL   No results found.  Review of Systems  Constitutional: Negative for fever and chills.  Respiratory: Negative for cough and shortness of breath.        Occ cough  Cardiovascular: Negative for chest pain.  Gastrointestinal: Negative for nausea, vomiting and abdominal pain.  Musculoskeletal: Negative for back pain.  Neurological: Negative for headaches.  Endo/Heme/Allergies: Does not bruise/bleed easily.    Vitals: BP 119/79  HR 85  R 20  TEMP 97.6  O2 SATS 98%RA                                                       Physical Exam  Constitutional: She is oriented to person, place, and time. She appears well-developed and well-nourished.  Cardiovascular: Normal rate and regular rhythm.   Respiratory: Effort normal and breath sounds normal.  Clean, intact rt chest PAC  GI:  Soft. Bowel sounds are normal. There is no tenderness.  Musculoskeletal: Normal range of motion.  Neurological: She is alert and oriented to person, place, and time.     Assessment/Plan:    Pt with hx of Castleman's disease. Plan is for port a cath removal today per pt request. Details/risks of procedure d/w pt/husband with their understanding and consent. Shabre Kreher,D KEVIN 10/29/2012, 8:10 AM

## 2013-01-15 ENCOUNTER — Telehealth: Payer: Self-pay | Admitting: *Deleted

## 2013-01-15 ENCOUNTER — Telehealth: Payer: Self-pay | Admitting: Internal Medicine

## 2013-01-15 ENCOUNTER — Other Ambulatory Visit: Payer: Self-pay | Admitting: *Deleted

## 2013-01-15 NOTE — Telephone Encounter (Signed)
Pt is requesting sooner f/u and rituxan appt.  Her sister is sick and she may be traveling to [place] so she would prefer to have her appts moved to this week.  Per Dr Donnald Garre, okay to move appts to this week if possible.  Onc tx schedule filled out.  SLJ

## 2013-01-15 NOTE — Telephone Encounter (Signed)
s.w. pt and advised on 6.20.14 appt....pt ok and aware °

## 2013-01-18 ENCOUNTER — Ambulatory Visit (HOSPITAL_BASED_OUTPATIENT_CLINIC_OR_DEPARTMENT_OTHER): Payer: PRIVATE HEALTH INSURANCE | Admitting: Internal Medicine

## 2013-01-18 ENCOUNTER — Telehealth: Payer: Self-pay | Admitting: *Deleted

## 2013-01-18 ENCOUNTER — Encounter: Payer: BC Managed Care – PPO | Admitting: Physician Assistant

## 2013-01-18 ENCOUNTER — Encounter: Payer: Self-pay | Admitting: Internal Medicine

## 2013-01-18 ENCOUNTER — Telehealth: Payer: Self-pay | Admitting: Internal Medicine

## 2013-01-18 ENCOUNTER — Other Ambulatory Visit (HOSPITAL_BASED_OUTPATIENT_CLINIC_OR_DEPARTMENT_OTHER): Payer: BC Managed Care – PPO | Admitting: Lab

## 2013-01-18 ENCOUNTER — Ambulatory Visit (HOSPITAL_BASED_OUTPATIENT_CLINIC_OR_DEPARTMENT_OTHER): Payer: Medicare Other

## 2013-01-18 VITALS — BP 111/67 | HR 90 | Temp 98.2°F | Resp 18

## 2013-01-18 DIAGNOSIS — D47Z2 Castleman disease: Secondary | ICD-10-CM

## 2013-01-18 DIAGNOSIS — Z5112 Encounter for antineoplastic immunotherapy: Secondary | ICD-10-CM

## 2013-01-18 DIAGNOSIS — R599 Enlarged lymph nodes, unspecified: Secondary | ICD-10-CM

## 2013-01-18 LAB — CBC WITH DIFFERENTIAL/PLATELET
BASO%: 1.1 % (ref 0.0–2.0)
Basophils Absolute: 0.1 10*3/uL (ref 0.0–0.1)
EOS%: 3 % (ref 0.0–7.0)
HGB: 14.9 g/dL (ref 11.6–15.9)
MCH: 31.1 pg (ref 25.1–34.0)
MCHC: 34.8 g/dL (ref 31.5–36.0)
MCV: 89.4 fL (ref 79.5–101.0)
MONO%: 7.2 % (ref 0.0–14.0)
NEUT%: 67 % (ref 38.4–76.8)
RDW: 12.9 % (ref 11.2–14.5)

## 2013-01-18 LAB — COMPREHENSIVE METABOLIC PANEL (CC13)
AST: 18 U/L (ref 5–34)
Alkaline Phosphatase: 115 U/L (ref 40–150)
BUN: 11.6 mg/dL (ref 7.0–26.0)
Glucose: 98 mg/dl (ref 70–99)
Total Bilirubin: 0.32 mg/dL (ref 0.20–1.20)

## 2013-01-18 MED ORDER — SODIUM CHLORIDE 0.9 % IV SOLN
Freq: Once | INTRAVENOUS | Status: AC
  Administered 2013-01-18: 12:00:00 via INTRAVENOUS

## 2013-01-18 MED ORDER — PROCHLORPERAZINE MALEATE 10 MG PO TABS: 10.0000 mg | ORAL_TABLET | Freq: Four times a day (QID) | ORAL | Status: DC | PRN

## 2013-01-18 MED ORDER — METHYLPREDNISOLONE SODIUM SUCC 125 MG IJ SOLR
125.0000 mg | Freq: Once | INTRAMUSCULAR | Status: AC
  Administered 2013-01-18: 125 mg via INTRAVENOUS

## 2013-01-18 MED ORDER — SODIUM CHLORIDE 0.9 % IV SOLN
375.0000 mg/m2 | Freq: Once | INTRAVENOUS | Status: AC
  Administered 2013-01-18: 700 mg via INTRAVENOUS
  Filled 2013-01-18: qty 70

## 2013-01-18 MED ORDER — DIPHENHYDRAMINE HCL 25 MG PO CAPS
50.0000 mg | ORAL_CAPSULE | Freq: Once | ORAL | Status: AC
  Administered 2013-01-18: 50 mg via ORAL

## 2013-01-18 MED ORDER — ACETAMINOPHEN 325 MG PO TABS
650.0000 mg | ORAL_TABLET | Freq: Once | ORAL | Status: AC
  Administered 2013-01-18: 650 mg via ORAL

## 2013-01-18 MED ORDER — LORAZEPAM 2 MG/ML IJ SOLN
1.0000 mg | Freq: Once | INTRAMUSCULAR | Status: AC
  Administered 2013-01-18: 12:00:00 via INTRAVENOUS

## 2013-01-18 NOTE — Progress Notes (Signed)
Rituxan rate increased to 68 mls/hr for 34 mls 150mg /hr

## 2013-01-18 NOTE — Progress Notes (Addendum)
Rituxan rate increased to 54mls/hr for 45 mls 200 mg/hr

## 2013-01-18 NOTE — Patient Instructions (Signed)
Continue treatment with Rituxan. Followup visit in 3 months

## 2013-01-18 NOTE — Telephone Encounter (Signed)
gave pt appt for chemo for september 2014 °

## 2013-01-18 NOTE — Progress Notes (Signed)
Pre rituxan vitals.  Rate started at 23 mls/hr for 11 mls.  50 mg/hr

## 2013-01-18 NOTE — Telephone Encounter (Signed)
Gave pt appt for lab, MD for September 2014 emailed Marcelino Duster regarding  chemo

## 2013-01-18 NOTE — Telephone Encounter (Signed)
Per staff message and POF I have scheduled appts.  JMW  

## 2013-01-18 NOTE — Addendum Note (Signed)
Addended by: Caren Griffins on: 01/18/2013 12:01 PM   Modules accepted: Orders

## 2013-01-18 NOTE — Progress Notes (Signed)
RN asked pt about nausea meds. She stated she was almost out at home and would like a refill.  There are no nausea meds on her med list.  RN notified Kathlee Nations RN that pt requested nausea med for home. Pt aware that script will be called in to her pharmacy. shk

## 2013-01-18 NOTE — Progress Notes (Signed)
Patient tolerated Rituxan without any s/sx of reaction.

## 2013-01-18 NOTE — Progress Notes (Signed)
[facility] Telephone:(336) [phone]   Fax:(336) [phone]  OFFICE PROGRESS NOTE  [name], MD [address] [facility], [place]. [place] [address]  DIAGNOSIS:  1. Castleman's Disease  2.Idiopathic Thrombocytopenic Purpura  3. 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  2) Weekly rituximab at 375 mg per meter squared with first cycle given in the hospital. Now status post 2 partial cycles and 7 complete cycles.   CURRENT THERAPY: Maintenance Rituxan 375 mg/M2 every 2 months, status post 7 cycles.   INTERVAL HISTORY: Irva Loser 57 y.o. female returns to the clinic today for followup visit. The patient is feeling fine today with no specific complaints. She denied having any significant chest pain, shortness of breath, cough or hemoptysis. She denied having any palpable lymphadenopathy. She has no weight loss or night sweats. The patient is here today to receive cycle #8 of her treatment with Rituxan. She has no fever or chills and no nausea or vomiting.  MEDICAL HISTORY: Past Medical History  Diagnosis Date  . Hypothyroidism   . Heart attack 2007  . Chronic kidney disease     failure  . Heart attack 2007  . Retroperitoneal lymphadenopathy 2012  . Castleman's disease   . ITP (idiopathic thrombocytopenic purpura) 06/17/2011    ALLERGIES:  is allergic to cefdinir; levofloxacin; and vicodin.  MEDICATIONS:  Current Outpatient Prescriptions  Medication Sig Dispense Refill  . ALPRAZolam (XANAX) 0.25 MG tablet Take 0.25 mg by mouth at bedtime as needed. For sleep      . ibuprofen (ADVIL,MOTRIN) 200 MG tablet Take 400 mg by mouth every 6 (six) hours as needed for pain or headache.      . levothyroxine (SYNTHROID, LEVOTHROID) 112 MCG tablet Take 112 mcg by mouth daily before breakfast.       .  metoprolol succinate (TOPROL-XL) 25 MG 24 hr tablet Take 50 mg by mouth 2 (two) times daily.       . RiTUXimab (RITUXAN IV) Inject 1 application into the vein every 8 (eight) weeks. Through port in chest  at [facility] [address] [place] last dose was 10/23/12       No current facility-administered medications for this visit.   Facility-Administered Medications Ordered in Other Visits  Medication Dose Route Frequency Provider Last Rate Last Dose  . sodium chloride 0.9 % injection 10 mL  10 mL Intracatheter PRN Si Gaul, MD        SURGICAL HISTORY:  Past Surgical History  Procedure Laterality Date  . Cesarean section  1983 1989  . Appendectomy      57 years old    REVIEW OF SYSTEMS:  A comprehensive review of systems was negative.   PHYSICAL EXAMINATION: General appearance: alert, cooperative and no distress Head: Normocephalic, without obvious abnormality, atraumatic Neck: no adenopathy Lymph nodes: Cervical, supraclavicular, and axillary nodes normal. Resp: clear to auscultation bilaterally Cardio: regular rate and rhythm, S1, S2 normal, no murmur, click, rub or gallop GI: soft, non-tender; bowel sounds normal; no masses,  no organomegaly Extremities: extremities normal, atraumatic, no cyanosis or edema  ECOG PERFORMANCE STATUS: 1 - Symptomatic but completely ambulatory  There were no vitals taken for this visit.  LABORATORY DATA: Lab Results  Component Value Date   WBC 10.0 01/18/2013   HGB 14.9 01/18/2013   HCT 42.7 01/18/2013   MCV 89.4  01/18/2013   PLT 353 01/18/2013      Chemistry      Component Value Date/Time   NA 139 01/18/2013 0851   NA 138 02/28/2012 0840   NA 143 12/13/2010 0957   K 4.1 01/18/2013 0851   K 4.7 02/28/2012 0840   K 4.3 12/13/2010 0957   CL 106 01/18/2013 0851   CL 107 02/28/2012 0840   CL 99 12/13/2010 0957   CO2 23 01/18/2013 0851   CO2 22 02/28/2012 0840   CO2 26 12/13/2010 0957   BUN 11.6 01/18/2013 0851   BUN 14 02/28/2012 0840     BUN 8 12/13/2010 0957   CREATININE 0.7 01/18/2013 0851   CREATININE 0.61 02/28/2012 0840   CREATININE 1.16* 05/31/2011 1530   CREATININE 0.7 12/13/2010 0957      Component Value Date/Time   CALCIUM 9.6 01/18/2013 0851   CALCIUM 9.4 02/28/2012 0840   CALCIUM 8.9 12/13/2010 0957   ALKPHOS 115 01/18/2013 0851   ALKPHOS 101 02/28/2012 0840   ALKPHOS 94* 12/13/2010 0957   AST 18 01/18/2013 0851   AST 15 02/28/2012 0840   AST 21 12/13/2010 0957   ALT 16 01/18/2013 0851   ALT 11 02/28/2012 0840   BILITOT 0.32 01/18/2013 0851   BILITOT 0.3 02/28/2012 0840   BILITOT 0.50 12/13/2010 0957       RADIOGRAPHIC STUDIES: No results found.  ASSESSMENT AND PLAN: This is a very pleasant 57 years old white female white female with Castleman's disease currently on treatment with Rituxan every 3 months with good control of her disease. I recommended for the patient to receive of cycle #8 today as scheduled. I also discussed with the patient the possibility of treatment in the future with the newly FDA approved treatment for Castleman disease with Sylvant (Siltuximab). I provided with the patient with handouts about this new medication. She would come back for followup visit in 3 months with the next cycle of her treatment with Rituxan. She was advised to call immediately if she has any concerning symptoms in the interval.  All questions were answered. The patient knows to call the clinic with any problems, questions or concerns. We can certainly see the patient much sooner if necessary.

## 2013-01-18 NOTE — Patient Instructions (Addendum)
[facility] Discharge Instructions for Patients Receiving Chemotherapy  Today you received the following chemotherapy agents Rituxan  To help prevent nausea and vomiting after your treatment, we encourage you to take your nausea medication as directed.   If you develop nausea and vomiting that is not controlled by your nausea medication, call the clinic.   BELOW ARE SYMPTOMS THAT SHOULD BE REPORTED IMMEDIATELY:  *FEVER GREATER THAN 100.5 F  *CHILLS WITH OR WITHOUT FEVER  NAUSEA AND VOMITING THAT IS NOT CONTROLLED WITH YOUR NAUSEA MEDICATION  *UNUSUAL SHORTNESS OF BREATH  *UNUSUAL BRUISING OR BLEEDING  TENDERNESS IN MOUTH AND THROAT WITH OR WITHOUT PRESENCE OF ULCERS  *URINARY PROBLEMS  *BOWEL PROBLEMS  UNUSUAL RASH Items with * indicate a potential emergency and should be followed up as soon as possible.  Feel free to call the clinic you have any questions or concerns. The clinic phone number is [phone].    

## 2013-01-24 ENCOUNTER — Ambulatory Visit: Payer: BC Managed Care – PPO | Admitting: Physician Assistant

## 2013-01-24 ENCOUNTER — Other Ambulatory Visit: Payer: BC Managed Care – PPO | Admitting: Lab

## 2013-01-30 ENCOUNTER — Other Ambulatory Visit: Payer: Self-pay | Admitting: *Deleted

## 2013-04-18 ENCOUNTER — Telehealth: Payer: Self-pay | Admitting: Internal Medicine

## 2013-04-18 ENCOUNTER — Telehealth: Payer: Self-pay | Admitting: *Deleted

## 2013-04-18 ENCOUNTER — Ambulatory Visit (HOSPITAL_BASED_OUTPATIENT_CLINIC_OR_DEPARTMENT_OTHER): Payer: Medicare Other | Admitting: Physician Assistant

## 2013-04-18 ENCOUNTER — Other Ambulatory Visit (HOSPITAL_BASED_OUTPATIENT_CLINIC_OR_DEPARTMENT_OTHER): Payer: Medicare Other | Admitting: Lab

## 2013-04-18 ENCOUNTER — Ambulatory Visit (HOSPITAL_BASED_OUTPATIENT_CLINIC_OR_DEPARTMENT_OTHER): Payer: Medicare Other

## 2013-04-18 ENCOUNTER — Encounter: Payer: Self-pay | Admitting: Physician Assistant

## 2013-04-18 VITALS — BP 141/85 | HR 65 | Temp 97.8°F | Resp 20 | Ht 64.5 in | Wt 189.9 lb

## 2013-04-18 VITALS — BP 115/77 | HR 72 | Temp 97.1°F | Resp 18

## 2013-04-18 DIAGNOSIS — R599 Enlarged lymph nodes, unspecified: Secondary | ICD-10-CM

## 2013-04-18 DIAGNOSIS — D47Z2 Castleman disease: Secondary | ICD-10-CM

## 2013-04-18 DIAGNOSIS — R05 Cough: Secondary | ICD-10-CM

## 2013-04-18 DIAGNOSIS — R059 Cough, unspecified: Secondary | ICD-10-CM

## 2013-04-18 DIAGNOSIS — R5381 Other malaise: Secondary | ICD-10-CM

## 2013-04-18 DIAGNOSIS — Z5112 Encounter for antineoplastic immunotherapy: Secondary | ICD-10-CM

## 2013-04-18 LAB — COMPREHENSIVE METABOLIC PANEL (CC13)
BUN: 11.5 mg/dL (ref 7.0–26.0)
CO2: 27 mEq/L (ref 22–29)
Creatinine: 0.8 mg/dL (ref 0.6–1.1)
Glucose: 91 mg/dl (ref 70–140)
Sodium: 143 mEq/L (ref 136–145)
Total Bilirubin: 0.28 mg/dL (ref 0.20–1.20)
Total Protein: 7.9 g/dL (ref 6.4–8.3)

## 2013-04-18 LAB — CBC WITH DIFFERENTIAL/PLATELET
Basophils Absolute: 0 10*3/uL (ref 0.0–0.1)
Eosinophils Absolute: 0.3 10*3/uL (ref 0.0–0.5)
HCT: 41.1 % (ref 34.8–46.6)
HGB: 14 g/dL (ref 11.6–15.9)
LYMPH%: 21.6 % (ref 14.0–49.7)
MCV: 89.7 fL (ref 79.5–101.0)
MONO%: 9.2 % (ref 0.0–14.0)
NEUT#: 5.7 10*3/uL (ref 1.5–6.5)
NEUT%: 65.8 % (ref 38.4–76.8)
Platelets: 328 10*3/uL (ref 145–400)

## 2013-04-18 LAB — LACTATE DEHYDROGENASE (CC13): LDH: 201 U/L (ref 125–245)

## 2013-04-18 MED ORDER — SODIUM CHLORIDE 0.9 % IV SOLN
375.0000 mg/m2 | Freq: Once | INTRAVENOUS | Status: AC
  Administered 2013-04-18: 700 mg via INTRAVENOUS
  Filled 2013-04-18: qty 70

## 2013-04-18 MED ORDER — DIPHENHYDRAMINE HCL 25 MG PO CAPS
50.0000 mg | ORAL_CAPSULE | Freq: Once | ORAL | Status: AC
  Administered 2013-04-18: 50 mg via ORAL

## 2013-04-18 MED ORDER — ACETAMINOPHEN 325 MG PO TABS
ORAL_TABLET | ORAL | Status: AC
  Filled 2013-04-18: qty 2

## 2013-04-18 MED ORDER — LORAZEPAM 2 MG/ML IJ SOLN
INTRAMUSCULAR | Status: AC
  Filled 2013-04-18: qty 1

## 2013-04-18 MED ORDER — METHYLPREDNISOLONE SODIUM SUCC 125 MG IJ SOLR
INTRAMUSCULAR | Status: AC
  Filled 2013-04-18: qty 2

## 2013-04-18 MED ORDER — METHYLPREDNISOLONE SODIUM SUCC 125 MG IJ SOLR
125.0000 mg | Freq: Once | INTRAMUSCULAR | Status: AC
  Administered 2013-04-18: 125 mg via INTRAVENOUS

## 2013-04-18 MED ORDER — ACETAMINOPHEN 325 MG PO TABS
650.0000 mg | ORAL_TABLET | Freq: Once | ORAL | Status: AC
  Administered 2013-04-18: 650 mg via ORAL

## 2013-04-18 MED ORDER — LORAZEPAM 2 MG/ML IJ SOLN
1.0000 mg | Freq: Once | INTRAMUSCULAR | Status: AC
  Administered 2013-04-18: 1 mg via INTRAVENOUS

## 2013-04-18 MED ORDER — DIPHENHYDRAMINE HCL 25 MG PO CAPS
ORAL_CAPSULE | ORAL | Status: AC
  Filled 2013-04-18: qty 2

## 2013-04-18 MED ORDER — SODIUM CHLORIDE 0.9 % IV SOLN: Freq: Once | INTRAVENOUS | Status: DC

## 2013-04-18 NOTE — Telephone Encounter (Signed)
, °

## 2013-04-18 NOTE — Patient Instructions (Addendum)
[facility] Discharge Instructions for Patients Receiving Chemotherapy  Today you received the following chemotherapy agents Rituxan.  To help prevent nausea and vomiting after your treatment, we encourage you to take your nausea medication as prescribed.   If you develop nausea and vomiting that is not controlled by your nausea medication, call the clinic.   BELOW ARE SYMPTOMS THAT SHOULD BE REPORTED IMMEDIATELY:  *FEVER GREATER THAN 100.5 F  *CHILLS WITH OR WITHOUT FEVER  NAUSEA AND VOMITING THAT IS NOT CONTROLLED WITH YOUR NAUSEA MEDICATION  *UNUSUAL SHORTNESS OF BREATH  *UNUSUAL BRUISING OR BLEEDING  TENDERNESS IN MOUTH AND THROAT WITH OR WITHOUT PRESENCE OF ULCERS  *URINARY PROBLEMS  *BOWEL PROBLEMS  UNUSUAL RASH Items with * indicate a potential emergency and should be followed up as soon as possible.  Feel free to call the clinic you have any questions or concerns. The clinic phone number is [phone].    

## 2013-04-18 NOTE — Telephone Encounter (Signed)
Per staff message and POF I have scheduled appts.  JMW  

## 2013-04-19 NOTE — Progress Notes (Addendum)
[facility] Telephone:([phone]   Fax:(336) [phone]  SHARED VISIT PROGRESS NOTE  [name], MD [address] [facility], [place]. [place] [address]  DIAGNOSIS:  1. Castleman's Disease  2.Idiopathic Thrombocytopenic Purpura  3. 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  2) Weekly rituximab at 375 mg per meter squared with first cycle given in the hospital. Now status post 2 partial cycles and 7 complete cycles.   CURRENT THERAPY: Maintenance Rituxan 375 mg/M2 every 2 months, status post 8 cycles. After cycle 7 of frequency increased to every 3 months with cycle 8 forward   INTERVAL HISTORY: [name] 57 y.o. female returns to the clinic today for followup visit. The patient is feeling fine today with no specific complaints except for some fatigue and occasional cough. She admits that she is still smoking but reports that she is down from a pack a day to a half a pack of. She also reports history of sinus problems and feels that this may be impacting her intermittent cough as well. She denied having any significant chest pain, shortness of breath,  or hemoptysis. She denied having any palpable lymphadenopathy. She has no weight loss or night sweats. The patient is here today to receive cycle #9 of her treatment with Rituxan. She has no fever or chills and no nausea or vomiting.  MEDICAL HISTORY: Past Medical History  Diagnosis Date  . Hypothyroidism   . Heart attack 2007  . Chronic kidney disease     failure  . Heart attack 2007  . Retroperitoneal lymphadenopathy 2012  . Castleman's disease   . ITP (idiopathic thrombocytopenic purpura) 06/17/2011    ALLERGIES:  is allergic to cefdinir; levofloxacin; and vicodin.  MEDICATIONS:  Current Outpatient Prescriptions  Medication Sig Dispense  Refill  . ALPRAZolam (XANAX) 0.25 MG tablet Take 0.25 mg by mouth at bedtime as needed. For sleep      . CRESTOR 10 MG tablet       . ibuprofen (ADVIL,MOTRIN) 200 MG tablet Take 400 mg by mouth every 6 (six) hours as needed for pain or headache.      . levothyroxine (SYNTHROID, LEVOTHROID) 112 MCG tablet Take 112 mcg by mouth daily before breakfast.       . metoprolol succinate (TOPROL-XL) 25 MG 24 hr tablet Take 50 mg by mouth 2 (two) times daily.       . prochlorperazine (COMPAZINE) 10 MG tablet Take 1 tablet (10 mg total) by mouth every 6 (six) hours as needed (for nausea and vomiting).  30 tablet  0  . RiTUXimab (RITUXAN IV) Inject 1 application into the vein every 8 (eight) weeks. Through port in chest  at [facility] [address] [place] last dose was 10/23/12       No current facility-administered medications for this visit.   Facility-Administered Medications Ordered in Other Visits  Medication Dose Route Frequency Provider Last Rate Last Dose  . sodium chloride 0.9 % injection 10 mL  10 mL Intracatheter PRN Si Gaul, MD        SURGICAL HISTORY:  Past Surgical History  Procedure Laterality Date  . Cesarean section  1983 1989  . Appendectomy      57 years old    REVIEW OF SYSTEMS:  A comprehensive review of systems was negative.   PHYSICAL EXAMINATION: General appearance: alert,  cooperative and no distress Head: Normocephalic, without obvious abnormality, atraumatic Neck: no adenopathy Lymph nodes: Cervical, supraclavicular, and axillary nodes normal. Resp: clear to auscultation bilaterally Cardio: regular rate and rhythm, S1, S2 normal, no murmur, click, rub or gallop GI: soft, non-tender; bowel sounds normal; no masses,  no organomegaly Extremities: extremities normal, atraumatic, no cyanosis or edema  ECOG PERFORMANCE STATUS: 1 - Symptomatic but completely ambulatory  Blood pressure 141/85, pulse 65, temperature 97.8 F (36.6 C), temperature source Oral,  resp. rate 20, height 5' 4.5" (1.638 m), weight 189 lb 14.4 oz (86.138 kg).  LABORATORY DATA: Lab Results  Component Value Date   WBC 8.7 04/18/2013   HGB 14.0 04/18/2013   HCT 41.1 04/18/2013   MCV 89.7 04/18/2013   PLT 328 04/18/2013      Chemistry      Component Value Date/Time   NA 143 04/18/2013 1010   NA 138 02/28/2012 0840   NA 143 12/13/2010 0957   K 4.2 04/18/2013 1010   K 4.7 02/28/2012 0840   K 4.3 12/13/2010 0957   CL 106 01/18/2013 0851   CL 107 02/28/2012 0840   CL 99 12/13/2010 0957   CO2 27 04/18/2013 1010   CO2 22 02/28/2012 0840   CO2 26 12/13/2010 0957   BUN 11.5 04/18/2013 1010   BUN 14 02/28/2012 0840   BUN 8 12/13/2010 0957   CREATININE 0.8 04/18/2013 1010   CREATININE 0.61 02/28/2012 0840   CREATININE 1.16* 05/31/2011 1530   CREATININE 0.7 12/13/2010 0957      Component Value Date/Time   CALCIUM 9.8 04/18/2013 1010   CALCIUM 9.4 02/28/2012 0840   CALCIUM 8.9 12/13/2010 0957   ALKPHOS 115 04/18/2013 1010   ALKPHOS 101 02/28/2012 0840   ALKPHOS 94* 12/13/2010 0957   AST 19 04/18/2013 1010   AST 15 02/28/2012 0840   AST 21 12/13/2010 0957   ALT 19 04/18/2013 1010   ALT 11 02/28/2012 0840   ALT 13 12/13/2010 0957   BILITOT 0.28 04/18/2013 1010   BILITOT 0.3 02/28/2012 0840   BILITOT 0.50 12/13/2010 0957       RADIOGRAPHIC STUDIES: No results found.  ASSESSMENT AND PLAN: This is a very pleasant 57 years old white female with Castleman's disease currently on treatment with Rituxan every 3 months with good control of her disease. She is now status post a total of 8 cycles of maintenance therapy. The patient was discussed with an also seen by Dr. Arbutus Ped. She will complete a total of 12 cycles of maintenance therapy and we will consider treatment in the future with the newly FDA approved treatment for Castleman disease with Sylvant (Siltuximab) should she have disease progression. She is encouraged strongly to discontinue smoking. She'll followup with Dr. Arbutus Ped in 2 months with  restaging CT scan of the chest, abdomen and pelvis with contrast to reevaluate her disease. She will then return in 3 months with repeat CBC differential and C. met and LDH prior to her next scheduled cycle of maintenance Rituxan.  Ivyanna Sibert E, PA-C   She was advised to call immediately if she has any concerning symptoms in the interval.  All questions were answered. The patient knows to call the clinic with any problems, questions or concerns. We can certainly see the patient much sooner if necessary.  ADDENDUM: Hematology/Oncology Attending: I have a face to face encounter with the patient during the visit. I recommended her care plan. The patient has Castleman disease and has been on maintenance  treatment with Rituxan is status post 8 cycles. She is tolerating her treatment fairly well with no significant adverse effects. The patient denied having any weight loss or night sweats. She denied having any palpable lymphadenopathy. We will proceed with cycle #9 today as scheduled. The patient would come back for followup visit in 3 months for the next cycle of her treatment. She was advised to call immediately if she has any concerning symptoms in the interval. Lajuana Matte., MD 04/21/2013

## 2013-04-19 NOTE — Patient Instructions (Addendum)
Followup with Dr. Arbutus Ped in 2 months with restaging CT scan of your chest, abdomen and pelvis to reevaluate her disease Followup in 3 months with repeat labs prior to your next scheduled cycle of maintenance Rituxan You're strongly advised to stop smoking

## 2013-06-14 ENCOUNTER — Other Ambulatory Visit (HOSPITAL_BASED_OUTPATIENT_CLINIC_OR_DEPARTMENT_OTHER): Payer: Medicare Other | Admitting: Lab

## 2013-06-14 ENCOUNTER — Encounter (HOSPITAL_COMMUNITY): Payer: Self-pay

## 2013-06-14 ENCOUNTER — Ambulatory Visit (HOSPITAL_COMMUNITY)
Admission: RE | Admit: 2013-06-14 | Discharge: 2013-06-14 | Disposition: A | Discharge status: Home / Self Care | Payer: Medicare Other | Source: Ambulatory Visit | Attending: Physician Assistant | Admitting: Physician Assistant

## 2013-06-14 DIAGNOSIS — D47Z2 Castleman disease: Secondary | ICD-10-CM

## 2013-06-14 DIAGNOSIS — K8020 Calculus of gallbladder without cholecystitis without obstruction: Secondary | ICD-10-CM | POA: Insufficient documentation

## 2013-06-14 DIAGNOSIS — K5730 Diverticulosis of large intestine without perforation or abscess without bleeding: Secondary | ICD-10-CM | POA: Insufficient documentation

## 2013-06-14 DIAGNOSIS — Z79899 Other long term (current) drug therapy: Secondary | ICD-10-CM | POA: Insufficient documentation

## 2013-06-14 DIAGNOSIS — R599 Enlarged lymph nodes, unspecified: Secondary | ICD-10-CM

## 2013-06-14 DIAGNOSIS — J438 Other emphysema: Secondary | ICD-10-CM | POA: Insufficient documentation

## 2013-06-14 LAB — COMPREHENSIVE METABOLIC PANEL (CC13)
ALT: 16 U/L (ref 0–55)
AST: 18 U/L (ref 5–34)
Albumin: 4.2 g/dL (ref 3.5–5.0)
Alkaline Phosphatase: 116 U/L (ref 40–150)
Calcium: 9.9 mg/dL (ref 8.4–10.4)
Chloride: 106 mEq/L (ref 98–109)
Potassium: 4.7 mEq/L (ref 3.5–5.1)
Sodium: 140 mEq/L (ref 136–145)
Total Protein: 7.4 g/dL (ref 6.4–8.3)

## 2013-06-14 LAB — CBC WITH DIFFERENTIAL/PLATELET
EOS%: 3.6 % (ref 0.0–7.0)
HGB: 14.3 g/dL (ref 11.6–15.9)
MCH: 30.4 pg (ref 25.1–34.0)
MCV: 90.2 fL (ref 79.5–101.0)
MONO%: 8.1 % (ref 0.0–14.0)
NEUT#: 5 10*3/uL (ref 1.5–6.5)
RBC: 4.7 10*6/uL (ref 3.70–5.45)
RDW: 12.8 % (ref 11.2–14.5)
lymph#: 2.1 10*3/uL (ref 0.9–3.3)

## 2013-06-14 MED ORDER — IOHEXOL 300 MG/ML  SOLN
100.0000 mL | Freq: Once | INTRAMUSCULAR | Status: AC | PRN
  Administered 2013-06-14: 100 mL via INTRAVENOUS

## 2013-06-18 ENCOUNTER — Telehealth: Payer: Self-pay | Admitting: Internal Medicine

## 2013-06-18 ENCOUNTER — Ambulatory Visit (HOSPITAL_BASED_OUTPATIENT_CLINIC_OR_DEPARTMENT_OTHER): Payer: Medicare Other | Admitting: Internal Medicine

## 2013-06-18 ENCOUNTER — Encounter: Payer: Self-pay | Admitting: Internal Medicine

## 2013-06-18 VITALS — BP 157/91 | HR 64 | Temp 97.6°F | Resp 19 | Ht 64.5 in | Wt 195.0 lb

## 2013-06-18 DIAGNOSIS — D47Z2 Castleman disease: Secondary | ICD-10-CM

## 2013-06-18 DIAGNOSIS — D693 Immune thrombocytopenic purpura: Secondary | ICD-10-CM

## 2013-06-18 DIAGNOSIS — R599 Enlarged lymph nodes, unspecified: Secondary | ICD-10-CM

## 2013-06-18 NOTE — Progress Notes (Signed)
[facility] Telephone:(336) [phone]   Fax:(336) [phone]  OFFICE PROGRESS NOTE  [name], MD [address] [facility], [place]. [place] [address]  DIAGNOSIS:  1. Castleman's Disease  2.Idiopathic Thrombocytopenic Purpura  3. 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  2) Weekly rituximab at 375 mg per meter squared with first cycle given in the hospital. Now status post 2 partial cycles and 7 complete cycles.   CURRENT THERAPY: Maintenance Rituxan 375 mg/M2 every 3 months, status post 8 cycles.   INTERVAL HISTORY: [name] 57 y.o. female returns to the clinic today for followup visit. The patient is feeling fine today with no specific complaints. She denied having any significant chest pain, shortness of breath, cough or hemoptysis. She denied having any palpable lymphadenopathy. She has no weight loss or night sweats. She has no fever or chills and no nausea or vomiting. She is expected to start cycle #9 of her treatment with Rituxan on 07/17/2013. The patient had repeat CT scan of the chest, abdomen and pelvis performed recently and she is here for evaluation and discussion of her scan results.  MEDICAL HISTORY: Past Medical History  Diagnosis Date  . Hypothyroidism   . Heart attack 2007  . Chronic kidney disease     failure  . Heart attack 2007  . Retroperitoneal lymphadenopathy 2012  . Castleman's disease   . ITP (idiopathic thrombocytopenic purpura) 06/17/2011    ALLERGIES:  is allergic to cefdinir; levofloxacin; and vicodin.  MEDICATIONS:  Current Outpatient Prescriptions  Medication Sig Dispense Refill  . ALPRAZolam (XANAX) 0.25 MG tablet Take 0.25 mg by mouth at bedtime as needed. For sleep      . CRESTOR 10 MG tablet       . ibuprofen (ADVIL,MOTRIN) 200 MG tablet Take 400  mg by mouth every 6 (six) hours as needed for pain or headache.      . levothyroxine (SYNTHROID, LEVOTHROID) 112 MCG tablet Take 112 mcg by mouth daily before breakfast.       . metoprolol succinate (TOPROL-XL) 25 MG 24 hr tablet Take 50 mg by mouth 2 (two) times daily.       . RiTUXimab (RITUXAN IV) Inject 1 application into the vein every 8 (eight) weeks. Through port in chest  at [facility] [address] [place] last dose was 10/23/12      . prochlorperazine (COMPAZINE) 10 MG tablet Take 1 tablet (10 mg total) by mouth every 6 (six) hours as needed (for nausea and vomiting).  30 tablet  0   No current facility-administered medications for this visit.   Facility-Administered Medications Ordered in Other Visits  Medication Dose Route Frequency Provider Last Rate Last Dose  . sodium chloride 0.9 % injection 10 mL  10 mL Intracatheter PRN Si Gaul, MD        SURGICAL HISTORY:  Past Surgical History  Procedure Laterality Date  . Cesarean section  1983 1989  . Appendectomy      57 years old    REVIEW OF SYSTEMS:  Constitutional: negative Eyes: negative Ears, nose, mouth, throat, and face: negative Respiratory: negative Cardiovascular: negative Gastrointestinal: negative Genitourinary:negative Integument/breast: negative Hematologic/lymphatic: negative Musculoskeletal:negative Neurological: negative Behavioral/Psych: negative Endocrine: negative Allergic/Immunologic: negative   PHYSICAL EXAMINATION: General appearance: alert, cooperative and no distress Head: Normocephalic, without obvious abnormality, atraumatic Neck: no adenopathy Lymph  nodes: Cervical, supraclavicular, and axillary nodes normal. Resp: clear to auscultation bilaterally Back: symmetric, no curvature. ROM normal. No CVA tenderness. Cardio: regular rate and rhythm, S1, S2 normal, no murmur, click, rub or gallop GI: soft, non-tender; bowel sounds normal; no masses,  no organomegaly Extremities:  extremities normal, atraumatic, no cyanosis or edema Neurologic: Alert and oriented X 3, normal strength and tone. Normal symmetric reflexes. Normal coordination and gait  ECOG PERFORMANCE STATUS: 1 - Symptomatic but completely ambulatory  Blood pressure 157/91, pulse 64, temperature 97.6 F (36.4 C), temperature source Oral, resp. rate 19, height 5' 4.5" (1.638 m), weight 195 lb (88.451 kg), SpO2 98.00%.  LABORATORY DATA: Lab Results  Component Value Date   WBC 8.0 06/14/2013   HGB 14.3 06/14/2013   HCT 42.4 06/14/2013   MCV 90.2 06/14/2013   PLT 352 06/14/2013      Chemistry      Component Value Date/Time   NA 140 06/14/2013 0812   NA 138 02/28/2012 0840   NA 143 12/13/2010 0957   K 4.7 06/14/2013 0812   K 4.7 02/28/2012 0840   K 4.3 12/13/2010 0957   CL 106 01/18/2013 0851   CL 107 02/28/2012 0840   CL 99 12/13/2010 0957   CO2 25 06/14/2013 0812   CO2 22 02/28/2012 0840   CO2 26 12/13/2010 0957   BUN 10.6 06/14/2013 0812   BUN 14 02/28/2012 0840   BUN 8 12/13/2010 0957   CREATININE 0.8 06/14/2013 0812   CREATININE 0.61 02/28/2012 0840   CREATININE 1.16* 05/31/2011 1530   CREATININE 0.7 12/13/2010 0957      Component Value Date/Time   CALCIUM 9.9 06/14/2013 0812   CALCIUM 9.4 02/28/2012 0840   CALCIUM 8.9 12/13/2010 0957   ALKPHOS 116 06/14/2013 0812   ALKPHOS 101 02/28/2012 0840   ALKPHOS 94* 12/13/2010 0957   AST 18 06/14/2013 0812   AST 15 02/28/2012 0840   AST 21 12/13/2010 0957   ALT 16 06/14/2013 0812   ALT 11 02/28/2012 0840   ALT 13 12/13/2010 0957   BILITOT 0.37 06/14/2013 0812   BILITOT 0.3 02/28/2012 0840   BILITOT 0.50 12/13/2010 0957       RADIOGRAPHIC STUDIES: Ct Chest W Contrast  06/14/2013   CLINICAL DATA:  Castleman disease.  Ongoing chemotherapy.  Cough.  EXAM: CT CHEST, ABDOMEN, AND PELVIS WITH CONTRAST  TECHNIQUE: Multidetector CT imaging of the chest, abdomen and pelvis was performed following the standard protocol during bolus administration of  intravenous contrast.  CONTRAST:  OMNIPAQUE IOHEXOL 300 MG/ML  SOLN  COMPARISON:  10/19/2012  FINDINGS: CT CHEST FINDINGS  No evidence of hilar or mediastinal masses. No adenopathy seen elsewhere within the thorax. No evidence of chest wall mass.  No evidence of pleural or pericardial effusion. Mild emphysema noted. No suspicious pulmonary nodules or masses identified. No evidence of pulmonary infiltrate or central endobronchial lesion.  CT ABDOMEN AND PELVIS FINDINGS  Mild retroperitoneal lymphadenopathy is again seen in the left paraaortic region and left common iliac chain. This is stable, with largest lymph node in the left paraaortic region measuring 12 mm in short axis on image 70. No new or increased sites of lymphadenopathy identified within the abdomen or pelvis. In the  The liver, pancreas, spleen, adrenal glands, and kidneys are normal in appearance. No evidence of hydronephrosis. No other soft tissue masses identified. Cholelithiasis is again noted, without evidence of cholecystitis or biliary dilatation.  Uterus and adnexal regions are unremarkable. Diverticulosis again seen  abdominal involving the left colon, however there is no evidence of diverticulitis. No other inflammatory process or abnormal fluid collections identified. No suspicious bone lesions identified.  IMPRESSION: No evidence of thoracic lymphadenopathy.  Stable mild abdominal retroperitoneal lymphadenopathy.  No evidence of new or progressive disease.  Stable incidental findings including cholelithiasis, diverticulosis, and mild emphysema.   Electronically Signed   By: Myles Rosenthal M.D.   On: 06/14/2013 10:07   ASSESSMENT AND PLAN: This is a very pleasant 57 years old white female with Castleman's disease currently on treatment with Rituxan every 3 months with good control of her disease. Her recent scan showed no evidence for disease progression. I discussed the scan results with the patient today. I recommended for the patient  to receive of cycle #9  next month as scheduled.  I also discussed with the patient the possibility of treatment in the future with the newly FDA approved treatment for Castleman disease with Sylvant (Siltuximab), but this is currently on hold She would come back for followup visit in 4 months with the next cycle of her treatment with Rituxan. She was advised to call immediately if she has any concerning symptoms in the interval.  All questions were answered. The patient knows to call the clinic with any problems, questions or concerns. We can certainly see the patient much sooner if necessary. I spent 15 minutes counseling the patient face to face. The total time spent in the appointment was 25 minutes.

## 2013-06-18 NOTE — Telephone Encounter (Signed)
lmonvm for pt re appts for 07/17/13 and 10/15/13. appt schedule mailed. per 11/18 pof pt to have lb/tx 07/17/13 and then again in 4 months 10/15/13. also per pof cx'd 12/17 f/u - pt will have next f/u w/10/15/13 appts

## 2013-06-18 NOTE — Patient Instructions (Signed)
Your scan showed no evidence for disease progression. Continue your maintenance treatment with Rituxan. Followup visit in 4 months

## 2013-06-19 ENCOUNTER — Telehealth: Payer: Self-pay | Admitting: Medical Oncology

## 2013-06-19 ENCOUNTER — Telehealth: Payer: Self-pay | Admitting: *Deleted

## 2013-06-19 NOTE — Telephone Encounter (Signed)
Per patient request I have moved her appts to earlier in the month

## 2013-06-19 NOTE — Telephone Encounter (Signed)
Pt wants to switch chemo to early in dec to avoid feeling "blah ' over the holidays. Message forwarded to chemo scheduler.

## 2013-06-21 ENCOUNTER — Telehealth: Payer: Self-pay | Admitting: Medical Oncology

## 2013-06-21 NOTE — Telephone Encounter (Signed)
I faxed Ct  Report to dr Jarold Motto per pt request as she has appt with him in [place].

## 2013-06-24 ENCOUNTER — Other Ambulatory Visit: Payer: Self-pay | Admitting: Internal Medicine

## 2013-06-24 ENCOUNTER — Other Ambulatory Visit (HOSPITAL_COMMUNITY): Payer: Self-pay | Admitting: Internal Medicine

## 2013-06-24 DIAGNOSIS — Z1231 Encounter for screening mammogram for malignant neoplasm of breast: Secondary | ICD-10-CM

## 2013-07-01 ENCOUNTER — Telehealth: Payer: Self-pay | Admitting: Medical Oncology

## 2013-07-01 NOTE — Telephone Encounter (Signed)
Had labs in nov -Per Dr Arbutus Ped pt should not need labs prior to rituxan dec 4th.

## 2013-07-03 ENCOUNTER — Other Ambulatory Visit: Payer: Medicare Other | Admitting: Lab

## 2013-07-04 ENCOUNTER — Ambulatory Visit (HOSPITAL_BASED_OUTPATIENT_CLINIC_OR_DEPARTMENT_OTHER): Payer: Medicare Other

## 2013-07-04 VITALS — BP 108/77 | HR 93 | Temp 99.3°F | Resp 18

## 2013-07-04 DIAGNOSIS — Z5112 Encounter for antineoplastic immunotherapy: Secondary | ICD-10-CM

## 2013-07-04 DIAGNOSIS — D47Z2 Castleman disease: Secondary | ICD-10-CM

## 2013-07-04 DIAGNOSIS — R599 Enlarged lymph nodes, unspecified: Secondary | ICD-10-CM

## 2013-07-04 MED ORDER — ACETAMINOPHEN 325 MG PO TABS
ORAL_TABLET | ORAL | Status: AC
  Filled 2013-07-04: qty 2

## 2013-07-04 MED ORDER — METHYLPREDNISOLONE SODIUM SUCC 125 MG IJ SOLR
INTRAMUSCULAR | Status: AC
Start: 2013-07-04 — End: 2013-07-04
  Filled 2013-07-04: qty 2

## 2013-07-04 MED ORDER — LORAZEPAM 2 MG/ML IJ SOLN
1.0000 mg | Freq: Once | INTRAMUSCULAR | Status: AC
  Administered 2013-07-04: 1 mg via INTRAVENOUS

## 2013-07-04 MED ORDER — DIPHENHYDRAMINE HCL 25 MG PO CAPS
ORAL_CAPSULE | ORAL | Status: AC
  Filled 2013-07-04: qty 2

## 2013-07-04 MED ORDER — ACETAMINOPHEN 325 MG PO TABS
650.0000 mg | ORAL_TABLET | Freq: Once | ORAL | Status: AC
  Administered 2013-07-04: 650 mg via ORAL

## 2013-07-04 MED ORDER — LORAZEPAM 2 MG/ML IJ SOLN
INTRAMUSCULAR | Status: AC
  Filled 2013-07-04: qty 1

## 2013-07-04 MED ORDER — SODIUM CHLORIDE 0.9 % IV SOLN
375.0000 mg/m2 | Freq: Once | INTRAVENOUS | Status: AC
  Administered 2013-07-04: 700 mg via INTRAVENOUS
  Filled 2013-07-04: qty 70

## 2013-07-04 MED ORDER — DIPHENHYDRAMINE HCL 25 MG PO CAPS
50.0000 mg | ORAL_CAPSULE | Freq: Once | ORAL | Status: AC
  Administered 2013-07-04: 50 mg via ORAL

## 2013-07-04 MED ORDER — METHYLPREDNISOLONE SODIUM SUCC 125 MG IJ SOLR
125.0000 mg | Freq: Once | INTRAMUSCULAR | Status: AC
  Administered 2013-07-04: 125 mg via INTRAVENOUS

## 2013-07-04 MED ORDER — SODIUM CHLORIDE 0.9 % IV SOLN
Freq: Once | INTRAVENOUS | Status: AC
  Administered 2013-07-04: 10:00:00 via INTRAVENOUS

## 2013-07-04 NOTE — Patient Instructions (Signed)
[facility] Discharge Instructions for Patients Receiving Chemotherapy  Today you received the following chemotherapy agents: Rituxan. To help prevent nausea and vomiting after your treatment, we encourage you to take your nausea medication.   If you develop nausea and vomiting that is not controlled by your nausea medication, call the clinic.   BELOW ARE SYMPTOMS THAT SHOULD BE REPORTED IMMEDIATELY:  *FEVER GREATER THAN 100.5 F  *CHILLS WITH OR WITHOUT FEVER  NAUSEA AND VOMITING THAT IS NOT CONTROLLED WITH YOUR NAUSEA MEDICATION  *UNUSUAL SHORTNESS OF BREATH  *UNUSUAL BRUISING OR BLEEDING  TENDERNESS IN MOUTH AND THROAT WITH OR WITHOUT PRESENCE OF ULCERS  *URINARY PROBLEMS  *BOWEL PROBLEMS  UNUSUAL RASH Items with * indicate a potential emergency and should be followed up as soon as possible.  Feel free to call the clinic you have any questions or concerns. The clinic phone number is [phone].    

## 2013-07-17 ENCOUNTER — Ambulatory Visit: Payer: Medicare Other | Admitting: Physician Assistant

## 2013-07-17 ENCOUNTER — Other Ambulatory Visit: Payer: Medicare Other | Admitting: Lab

## 2013-07-17 ENCOUNTER — Ambulatory Visit: Payer: Medicare Other

## 2013-07-29 ENCOUNTER — Ambulatory Visit (HOSPITAL_COMMUNITY): Payer: BC Managed Care – PPO

## 2013-08-15 ENCOUNTER — Ambulatory Visit (HOSPITAL_COMMUNITY)
Admission: RE | Admit: 2013-08-15 | Discharge: 2013-08-15 | Disposition: A | Discharge status: Home / Self Care | Payer: Medicare HMO | Source: Ambulatory Visit | Attending: Internal Medicine | Admitting: Internal Medicine

## 2013-08-15 DIAGNOSIS — Z1231 Encounter for screening mammogram for malignant neoplasm of breast: Secondary | ICD-10-CM | POA: Insufficient documentation

## 2013-09-12 ENCOUNTER — Telehealth: Payer: Self-pay | Admitting: *Deleted

## 2013-09-12 NOTE — Telephone Encounter (Signed)
Called and left vm that schedulers will call her with new appts for the end of February per pt request.  SLJ

## 2013-09-12 NOTE — Telephone Encounter (Signed)
Message copied by Britt Bottom on Thu Sep 12, 2013  1:20 PM ------      Message from: Curt Bears      Created: Wed Sep 11, 2013 11:27 PM       Ok.      ----- Message -----         From: Anders Grant, RN         Sent: 09/11/2013   4:19 PM           To: Curt Bears, MD            Pt last had rituxan 07/04/14.  She is scheduled for 10/15/13.  She said you guys had talked about how she could have her last 2 rituxans sooner than every 3 months if she would like.  She is requesting to get the rituxan the last week of February.              Are you okay with [place] moving the appts up?       ------

## 2013-09-13 ENCOUNTER — Telehealth: Payer: Self-pay | Admitting: *Deleted

## 2013-09-13 NOTE — Telephone Encounter (Signed)
Per staff message and POF I have scheduled appts.  JMW  

## 2013-09-24 ENCOUNTER — Other Ambulatory Visit (HOSPITAL_BASED_OUTPATIENT_CLINIC_OR_DEPARTMENT_OTHER): Payer: Medicare HMO

## 2013-09-24 ENCOUNTER — Telehealth: Payer: Self-pay | Admitting: Internal Medicine

## 2013-09-24 ENCOUNTER — Ambulatory Visit (HOSPITAL_BASED_OUTPATIENT_CLINIC_OR_DEPARTMENT_OTHER): Payer: Medicare HMO

## 2013-09-24 ENCOUNTER — Ambulatory Visit (HOSPITAL_BASED_OUTPATIENT_CLINIC_OR_DEPARTMENT_OTHER): Payer: Medicare HMO | Admitting: Physician Assistant

## 2013-09-24 ENCOUNTER — Encounter: Payer: Self-pay | Admitting: Physician Assistant

## 2013-09-24 VITALS — BP 143/74 | HR 66 | Temp 97.6°F | Resp 20 | Ht 64.5 in | Wt 199.2 lb

## 2013-09-24 VITALS — BP 120/72 | HR 75 | Temp 98.2°F | Resp 16

## 2013-09-24 DIAGNOSIS — R599 Enlarged lymph nodes, unspecified: Secondary | ICD-10-CM

## 2013-09-24 DIAGNOSIS — Z5112 Encounter for antineoplastic immunotherapy: Secondary | ICD-10-CM

## 2013-09-24 DIAGNOSIS — D47Z2 Castleman disease: Secondary | ICD-10-CM

## 2013-09-24 DIAGNOSIS — D693 Immune thrombocytopenic purpura: Secondary | ICD-10-CM

## 2013-09-24 LAB — CBC WITH DIFFERENTIAL/PLATELET
BASO%: 0.7 % (ref 0.0–2.0)
BASOS ABS: 0.1 10*3/uL (ref 0.0–0.1)
EOS ABS: 0.2 10*3/uL (ref 0.0–0.5)
EOS%: 2.9 % (ref 0.0–7.0)
HCT: 42.5 % (ref 34.8–46.6)
HGB: 14.4 g/dL (ref 11.6–15.9)
LYMPH%: 22.3 % (ref 14.0–49.7)
MCH: 30.6 pg (ref 25.1–34.0)
MCHC: 33.9 g/dL (ref 31.5–36.0)
MCV: 90.2 fL (ref 79.5–101.0)
MONO#: 0.7 10*3/uL (ref 0.1–0.9)
MONO%: 8.9 % (ref 0.0–14.0)
NEUT%: 65.2 % (ref 38.4–76.8)
NEUTROS ABS: 5.4 10*3/uL (ref 1.5–6.5)
NRBC: 0 % (ref 0–0)
Platelets: 364 10*3/uL (ref 145–400)
RBC: 4.71 10*6/uL (ref 3.70–5.45)
RDW: 12.9 % (ref 11.2–14.5)
WBC: 8.3 10*3/uL (ref 3.9–10.3)
lymph#: 1.9 10*3/uL (ref 0.9–3.3)

## 2013-09-24 LAB — COMPREHENSIVE METABOLIC PANEL (CC13)
ALT: 21 U/L (ref 0–55)
ANION GAP: 9 meq/L (ref 3–11)
AST: 19 U/L (ref 5–34)
Albumin: 4.5 g/dL (ref 3.5–5.0)
Alkaline Phosphatase: 123 U/L (ref 40–150)
BILIRUBIN TOTAL: 0.33 mg/dL (ref 0.20–1.20)
BUN: 12.6 mg/dL (ref 7.0–26.0)
CHLORIDE: 107 meq/L (ref 98–109)
CO2: 27 mEq/L (ref 22–29)
Calcium: 9.8 mg/dL (ref 8.4–10.4)
Creatinine: 0.8 mg/dL (ref 0.6–1.1)
GLUCOSE: 104 mg/dL (ref 70–140)
Potassium: 4.5 mEq/L (ref 3.5–5.1)
SODIUM: 142 meq/L (ref 136–145)
Total Protein: 7.7 g/dL (ref 6.4–8.3)

## 2013-09-24 LAB — LACTATE DEHYDROGENASE (CC13): LDH: 170 U/L (ref 125–245)

## 2013-09-24 MED ORDER — METHYLPREDNISOLONE SODIUM SUCC 125 MG IJ SOLR
INTRAMUSCULAR | Status: AC
  Filled 2013-09-24: qty 2

## 2013-09-24 MED ORDER — DIPHENHYDRAMINE HCL 25 MG PO CAPS
ORAL_CAPSULE | ORAL | Status: AC
  Filled 2013-09-24: qty 2

## 2013-09-24 MED ORDER — ACETAMINOPHEN 325 MG PO TABS
ORAL_TABLET | ORAL | Status: AC
  Filled 2013-09-24: qty 2

## 2013-09-24 MED ORDER — SODIUM CHLORIDE 0.9 % IV SOLN
Freq: Once | INTRAVENOUS | Status: AC
  Administered 2013-09-24: 11:00:00 via INTRAVENOUS

## 2013-09-24 MED ORDER — DIPHENHYDRAMINE HCL 25 MG PO CAPS
50.0000 mg | ORAL_CAPSULE | Freq: Once | ORAL | Status: AC
  Administered 2013-09-24: 50 mg via ORAL

## 2013-09-24 MED ORDER — SODIUM CHLORIDE 0.9 % IV SOLN
375.0000 mg/m2 | Freq: Once | INTRAVENOUS | Status: AC
  Administered 2013-09-24: 700 mg via INTRAVENOUS
  Filled 2013-09-24: qty 70

## 2013-09-24 MED ORDER — ACETAMINOPHEN 325 MG PO TABS
650.0000 mg | ORAL_TABLET | Freq: Once | ORAL | Status: AC
  Administered 2013-09-24: 650 mg via ORAL

## 2013-09-24 MED ORDER — LORAZEPAM 2 MG/ML IJ SOLN
INTRAMUSCULAR | Status: AC
  Filled 2013-09-24: qty 1

## 2013-09-24 MED ORDER — LORAZEPAM 2 MG/ML IJ SOLN
1.0000 mg | Freq: Once | INTRAMUSCULAR | Status: AC
  Administered 2013-09-24: 1 mg via INTRAVENOUS

## 2013-09-24 MED ORDER — METHYLPREDNISOLONE SODIUM SUCC 125 MG IJ SOLR
125.0000 mg | Freq: Once | INTRAMUSCULAR | Status: AC
  Administered 2013-09-24: 125 mg via INTRAVENOUS

## 2013-09-24 NOTE — Patient Instructions (Signed)
Follow up in 3 months

## 2013-09-24 NOTE — Progress Notes (Addendum)
[place] [phone]   [phone]) [phone]  SHARED VISIT PROGRESS NOTE  Donnajean Lopes, MD [place] [address]  DIAGNOSIS:  1. Castleman's Disease  2.Idiopathic Thrombocytopenic Purpura  3. 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  2) Weekly rituximab at 375 mg per meter squared with first cycle given in the hospital. Now status post 2 partial cycles and 7 complete cycles.   CURRENT THERAPY: Maintenance Rituxan 375 mg/M2 every 3 months, status post 9 cycles.   INTERVAL HISTORY: [name] 58 y.o. female returns to the clinic today for followup visit. The patient is feeling fine today with no specific complaints except for some intermittent fleeting pain in her left axilla. This is the site of her lymph node excision in 2012. She denied having any significant chest pain, shortness of breath, cough or hemoptysis. She denied having any palpable lymphadenopathy. She has no weight loss or night sweats. She has no fever or chills and no nausea or vomiting. She presents today to start  cycle #10 of her treatment with Rituxan.   MEDICAL HISTORY: Past Medical History  Diagnosis Date  . Hypothyroidism   . Heart attack 2007  . Chronic kidney disease     failure  . Heart attack 2007  . Retroperitoneal lymphadenopathy 2012  . Castleman's disease   . ITP (idiopathic thrombocytopenic purpura) 06/17/2011    ALLERGIES:  is allergic to cefdinir; levofloxacin; and vicodin.  MEDICATIONS:  Current Outpatient Prescriptions  Medication Sig Dispense Refill  . ALPRAZolam (XANAX) 0.25 MG tablet Take 0.25 mg by mouth at bedtime as needed. For sleep      . CRESTOR 10 MG tablet       . ibuprofen (ADVIL,MOTRIN) 200 MG tablet Take 400 mg by mouth every 6 (six) hours as needed for pain or headache.      .  levothyroxine (SYNTHROID, LEVOTHROID) 112 MCG tablet Take 112 mcg by mouth daily before breakfast.       . metoprolol succinate (TOPROL-XL) 25 MG 24 hr tablet Take 50 mg by mouth 2 (two) times daily.       . prochlorperazine (COMPAZINE) 10 MG tablet Take 1 tablet (10 mg total) by mouth every 6 (six) hours as needed (for nausea and vomiting).  30 tablet  0  . RiTUXimab (RITUXAN IV) Inject 1 application into the vein every 8 (eight) weeks. Through port in chest  at [facility], [place] last dose was 10/23/12       No current facility-administered medications for this visit.   Facility-Administered Medications Ordered in Other Visits  Medication Dose Route Frequency Provider Last Rate Last Dose  . 0.9 %  sodium chloride infusion   Intravenous Once Curt Bears, MD      . methylPREDNISolone sodium succinate (SOLU-MEDROL) 125 mg/2 mL injection 125 mg  125 mg Intravenous Once Curt Bears, MD      . riTUXimab (RITUXAN) 700 mg in sodium chloride 0.9 % 250 mL chemo infusion  375 mg/m2 (Order-Specific) Intravenous Once Curt Bears, MD      . sodium chloride 0.9 % injection 10 mL  10 mL Intracatheter PRN Curt Bears, MD        SURGICAL HISTORY:  Past Surgical History  Procedure Laterality Date  . Cesarean section  1983 1989  . Appendectomy  58 years old    REVIEW OF SYSTEMS:  Constitutional: negative Eyes: negative Ears, nose, mouth, throat, and face: negative Respiratory: negative Cardiovascular: negative Gastrointestinal: negative Genitourinary:negative Integument/breast: negative Hematologic/lymphatic: negative Musculoskeletal:positive for Fleeting pain in the left axilla over the past month Neurological: negative Behavioral/Psych: negative Endocrine: negative Allergic/Immunologic: negative   PHYSICAL EXAMINATION: General appearance: alert, cooperative and no distress Head: Normocephalic, without obvious abnormality, atraumatic Neck: no  adenopathy Lymph nodes: Cervical, supraclavicular, and axillary nodes normal. Resp: clear to auscultation bilaterally Back: symmetric, no curvature. ROM normal. No CVA tenderness. Cardio: regular rate and rhythm, S1, S2 normal, no murmur, click, rub or gallop GI: soft, non-tender; bowel sounds normal; no masses,  no organomegaly Extremities: extremities normal, atraumatic, no cyanosis or edema Neurologic: Alert and oriented X 3, normal strength and tone. Normal symmetric reflexes. Normal coordination and gait Nothing palpable in either axilla specifically no lymphadenopathy or masses, no edema.  ECOG PERFORMANCE STATUS: 1 - Symptomatic but completely ambulatory  Blood pressure 143/74, pulse 66, temperature 97.6 F (36.4 C), temperature source Oral, resp. rate 20, height 5' 4.5" (1.638 m), weight 199 lb 3.2 oz (90.357 kg).  LABORATORY DATA: Lab Results  Component Value Date   WBC 8.3 09/24/2013   HGB 14.4 09/24/2013   HCT 42.5 09/24/2013   MCV 90.2 09/24/2013   PLT 364 09/24/2013      Chemistry      Component Value Date/Time   NA 140 06/14/2013 0812   NA 138 02/28/2012 0840   NA 143 12/13/2010 0957   K 4.7 06/14/2013 0812   K 4.7 02/28/2012 0840   K 4.3 12/13/2010 0957   CL 106 01/18/2013 0851   CL 107 02/28/2012 0840   CL 99 12/13/2010 0957   CO2 25 06/14/2013 0812   CO2 22 02/28/2012 0840   CO2 26 12/13/2010 0957   BUN 10.6 06/14/2013 0812   BUN 14 02/28/2012 0840   BUN 8 12/13/2010 0957   CREATININE 0.8 06/14/2013 0812   CREATININE 0.61 02/28/2012 0840   CREATININE 1.16* 05/31/2011 1530   CREATININE 0.7 12/13/2010 0957      Component Value Date/Time   CALCIUM 9.9 06/14/2013 0812   CALCIUM 9.4 02/28/2012 0840   CALCIUM 8.9 12/13/2010 0957   ALKPHOS 116 06/14/2013 0812   ALKPHOS 101 02/28/2012 0840   ALKPHOS 94* 12/13/2010 0957   AST 18 06/14/2013 0812   AST 15 02/28/2012 0840   AST 21 12/13/2010 0957   ALT 16 06/14/2013 0812   ALT 11 02/28/2012 0840   ALT 13 12/13/2010 0957    BILITOT 0.37 06/14/2013 0812   BILITOT 0.3 02/28/2012 0840   BILITOT 0.50 12/13/2010 0957       RADIOGRAPHIC STUDIES: Ct Chest W Contrast  06/14/2013   CLINICAL DATA:  Castleman disease.  Ongoing chemotherapy.  Cough.  EXAM: CT CHEST, ABDOMEN, AND PELVIS WITH CONTRAST  TECHNIQUE: Multidetector CT imaging of the chest, abdomen and pelvis was performed following the standard protocol during bolus administration of intravenous contrast.  CONTRAST:  140mL OMNIPAQUE IOHEXOL 300 MG/ML  SOLN  COMPARISON:  10/19/2012  FINDINGS: CT CHEST FINDINGS  No evidence of hilar or mediastinal masses. No adenopathy seen elsewhere within the thorax. No evidence of chest wall mass.  No evidence of pleural or pericardial effusion. Mild emphysema noted. No suspicious pulmonary nodules or masses identified. No evidence of pulmonary infiltrate or central endobronchial lesion.  CT ABDOMEN AND PELVIS FINDINGS  Mild retroperitoneal lymphadenopathy is again seen in the left paraaortic region  and left common iliac chain. This is stable, with largest lymph node in the left paraaortic region measuring 12 mm in short axis on image 70. No new or increased sites of lymphadenopathy identified within the abdomen or pelvis. In the  The liver, pancreas, spleen, adrenal glands, and kidneys are normal in appearance. No evidence of hydronephrosis. No other soft tissue masses identified. Cholelithiasis is again noted, without evidence of cholecystitis or biliary dilatation.  Uterus and adnexal regions are unremarkable. Diverticulosis again seen abdominal involving the left colon, however there is no evidence of diverticulitis. No other inflammatory process or abnormal fluid collections identified. No suspicious bone lesions identified.  IMPRESSION: No evidence of thoracic lymphadenopathy.  Stable mild abdominal retroperitoneal lymphadenopathy.  No evidence of new or progressive disease.  Stable incidental findings including cholelithiasis,  diverticulosis, and mild emphysema.   Electronically Signed   By: Earle Gell M.D.   On: 06/14/2013 10:07   ASSESSMENT AND PLAN: This is a very pleasant 58 years old white female with Castleman's disease currently on treatment with Rituxan every 3 months with good control of her disease. Her recent scan showed no evidence for disease progression. Patient was discussed with and also seen by Dr. Julien Nordmann. She will proceed with cycle #10 today as scheduled. She'll followup with Dr. Julien Nordmann in approximately 3 months with cycle #11 of her maintenance Rituxan.  Dr. Julien Nordmann discussed with the patient the possibility of treatment in the future with the newly FDA approved treatment for Castleman disease with Sylvant (Siltuximab), but this is currently on hold She was advised to call immediately if she has any concerning symptoms in the interval.  All questions were answered. The patient knows to call the clinic with any problems, questions or concerns. We can certainly see the patient much sooner if necessary.  Carlton Adam PA-C  ADDENDUM: Hematology/Oncology Attending: I had the face to face encounter with the patient today. I recommended her care plan. The patient is a very pleasant 58 years old white female with history of Castleman disease in addition to a history of idiopathic thrombocytopenic purpura. She has been on treatment with maintenance Rituxan every 3 months is status post 9 cycles and tolerating her treatment fairly well with no significant adverse effects except for intermittent pain on the left axilla. I recommended for the patient to proceed with her treatment today as scheduled. She would come back for followup visit in 3 months with the next cycle of her treatment. She was advised to call immediately if she has any concerning symptoms in the interval.  Disclaimer: This note was dictated with voice recognition software. Similar sounding words can inadvertently be transcribed and may  not be corrected upon review.

## 2013-09-24 NOTE — Patient Instructions (Signed)
[facility] Discharge Instructions for Patients Receiving Chemotherapy  Today you received the following chemotherapy agents: Rituxan. To help prevent nausea and vomiting after your treatment, we encourage you to take your nausea medication.   If you develop nausea and vomiting that is not controlled by your nausea medication, call the clinic.   BELOW ARE SYMPTOMS THAT SHOULD BE REPORTED IMMEDIATELY:  *FEVER GREATER THAN 100.5 F  *CHILLS WITH OR WITHOUT FEVER  NAUSEA AND VOMITING THAT IS NOT CONTROLLED WITH YOUR NAUSEA MEDICATION  *UNUSUAL SHORTNESS OF BREATH  *UNUSUAL BRUISING OR BLEEDING  TENDERNESS IN MOUTH AND THROAT WITH OR WITHOUT PRESENCE OF ULCERS  *URINARY PROBLEMS  *BOWEL PROBLEMS  UNUSUAL RASH Items with * indicate a potential emergency and should be followed up as soon as possible.  Feel free to call the clinic you have any questions or concerns. The clinic phone number is [phone].    

## 2013-09-24 NOTE — Telephone Encounter (Signed)
gv adn printed aptps ched and avs for pt for May...sed added tx. °

## 2013-10-15 ENCOUNTER — Ambulatory Visit: Payer: Medicare Other | Admitting: Physician Assistant

## 2013-10-15 ENCOUNTER — Ambulatory Visit: Payer: Medicare Other

## 2013-10-15 ENCOUNTER — Other Ambulatory Visit: Payer: Medicare Other

## 2013-12-24 ENCOUNTER — Other Ambulatory Visit (HOSPITAL_BASED_OUTPATIENT_CLINIC_OR_DEPARTMENT_OTHER): Payer: Medicare HMO

## 2013-12-24 ENCOUNTER — Telehealth: Payer: Self-pay | Admitting: Internal Medicine

## 2013-12-24 ENCOUNTER — Ambulatory Visit (HOSPITAL_BASED_OUTPATIENT_CLINIC_OR_DEPARTMENT_OTHER): Payer: Medicare HMO | Admitting: Internal Medicine

## 2013-12-24 ENCOUNTER — Ambulatory Visit (HOSPITAL_BASED_OUTPATIENT_CLINIC_OR_DEPARTMENT_OTHER): Payer: Medicare HMO

## 2013-12-24 ENCOUNTER — Encounter: Payer: Self-pay | Admitting: Internal Medicine

## 2013-12-24 VITALS — BP 139/78 | HR 75 | Temp 97.7°F | Resp 18 | Ht 64.0 in | Wt 194.3 lb

## 2013-12-24 VITALS — BP 103/63 | HR 83 | Temp 98.6°F | Resp 18

## 2013-12-24 DIAGNOSIS — D47Z2 Castleman disease: Secondary | ICD-10-CM

## 2013-12-24 DIAGNOSIS — Z5112 Encounter for antineoplastic immunotherapy: Secondary | ICD-10-CM

## 2013-12-24 DIAGNOSIS — R599 Enlarged lymph nodes, unspecified: Secondary | ICD-10-CM

## 2013-12-24 LAB — COMPREHENSIVE METABOLIC PANEL (CC13)
ALK PHOS: 103 U/L (ref 40–150)
ALT: 15 U/L (ref 0–55)
AST: 15 U/L (ref 5–34)
Albumin: 4 g/dL (ref 3.5–5.0)
Anion Gap: 10 mEq/L (ref 3–11)
BUN: 11.7 mg/dL (ref 7.0–26.0)
CO2: 23 mEq/L (ref 22–29)
Calcium: 9.6 mg/dL (ref 8.4–10.4)
Chloride: 109 mEq/L (ref 98–109)
Creatinine: 0.8 mg/dL (ref 0.6–1.1)
Glucose: 107 mg/dl (ref 70–140)
POTASSIUM: 4.4 meq/L (ref 3.5–5.1)
Sodium: 143 mEq/L (ref 136–145)
TOTAL PROTEIN: 7.1 g/dL (ref 6.4–8.3)
Total Bilirubin: 0.34 mg/dL (ref 0.20–1.20)

## 2013-12-24 LAB — CBC WITH DIFFERENTIAL/PLATELET
BASO%: 0.9 % (ref 0.0–2.0)
Basophils Absolute: 0.1 10*3/uL (ref 0.0–0.1)
EOS ABS: 0.2 10*3/uL (ref 0.0–0.5)
EOS%: 3.1 % (ref 0.0–7.0)
HCT: 43.3 % (ref 34.8–46.6)
HEMOGLOBIN: 14.4 g/dL (ref 11.6–15.9)
LYMPH#: 1.8 10*3/uL (ref 0.9–3.3)
LYMPH%: 22.8 % (ref 14.0–49.7)
MCH: 30.3 pg (ref 25.1–34.0)
MCHC: 33.2 g/dL (ref 31.5–36.0)
MCV: 91.2 fL (ref 79.5–101.0)
MONO#: 0.7 10*3/uL (ref 0.1–0.9)
MONO%: 8.7 % (ref 0.0–14.0)
NEUT%: 64.5 % (ref 38.4–76.8)
NEUTROS ABS: 5.2 10*3/uL (ref 1.5–6.5)
Platelets: 335 10*3/uL (ref 145–400)
RBC: 4.74 10*6/uL (ref 3.70–5.45)
RDW: 12.9 % (ref 11.2–14.5)
WBC: 8 10*3/uL (ref 3.9–10.3)

## 2013-12-24 LAB — LACTATE DEHYDROGENASE (CC13): LDH: 145 U/L (ref 125–245)

## 2013-12-24 MED ORDER — ACETAMINOPHEN 325 MG PO TABS
ORAL_TABLET | ORAL | Status: AC
  Filled 2013-12-24: qty 2

## 2013-12-24 MED ORDER — ACETAMINOPHEN 325 MG PO TABS
650.0000 mg | ORAL_TABLET | Freq: Once | ORAL | Status: AC
  Administered 2013-12-24: 650 mg via ORAL

## 2013-12-24 MED ORDER — LORAZEPAM 2 MG/ML IJ SOLN
1.0000 mg | Freq: Once | INTRAMUSCULAR | Status: AC
  Administered 2013-12-24: 1 mg via INTRAVENOUS

## 2013-12-24 MED ORDER — SODIUM CHLORIDE 0.9 % IV SOLN
375.0000 mg/m2 | Freq: Once | INTRAVENOUS | Status: AC
  Administered 2013-12-24: 700 mg via INTRAVENOUS
  Filled 2013-12-24: qty 70

## 2013-12-24 MED ORDER — DIPHENHYDRAMINE HCL 25 MG PO CAPS
50.0000 mg | ORAL_CAPSULE | Freq: Once | ORAL | Status: AC
  Administered 2013-12-24: 50 mg via ORAL

## 2013-12-24 MED ORDER — LORAZEPAM 2 MG/ML IJ SOLN
INTRAMUSCULAR | Status: AC
  Filled 2013-12-24: qty 1

## 2013-12-24 MED ORDER — METHYLPREDNISOLONE SODIUM SUCC 125 MG IJ SOLR
125.0000 mg | Freq: Once | INTRAMUSCULAR | Status: AC
  Administered 2013-12-24: 125 mg via INTRAVENOUS

## 2013-12-24 MED ORDER — SODIUM CHLORIDE 0.9 % IV SOLN
Freq: Once | INTRAVENOUS | Status: AC
  Administered 2013-12-24: 10:00:00 via INTRAVENOUS

## 2013-12-24 MED ORDER — DIPHENHYDRAMINE HCL 25 MG PO CAPS
ORAL_CAPSULE | ORAL | Status: AC
Start: 2013-12-24 — End: 2013-12-24
  Filled 2013-12-24: qty 2

## 2013-12-24 MED ORDER — METHYLPREDNISOLONE SODIUM SUCC 125 MG IJ SOLR
INTRAMUSCULAR | Status: AC
  Filled 2013-12-24: qty 2

## 2013-12-24 NOTE — Patient Instructions (Signed)
You Can Quit Smoking If you are ready to quit smoking or are thinking about it, congratulations! You have chosen to help yourself be healthier and live longer! There are lots of different ways to quit smoking. Nicotine gum, nicotine patches, a nicotine inhaler, or nicotine nasal spray can help with physical craving. Hypnosis, support groups, and medicines help break the habit of smoking. TIPS TO GET OFF AND STAY OFF CIGARETTES  Learn to predict your moods. Do not let a bad situation be your excuse to have a cigarette. Some situations in your life might tempt you to have a cigarette.  Ask friends and co-workers not to smoke around you.  Make your home smoke-free.  Never have "just one" cigarette. It leads to wanting another and another. Remind yourself of your decision to quit.  On a card, make a list of your reasons for not smoking. Read it at least the same number of times a day as you have a cigarette. Tell yourself everyday, "I do not want to smoke. I choose not to smoke."  Ask someone at home or work to help you with your plan to quit smoking.  Have something planned after you eat or have a cup of coffee. Take a walk or get other exercise to perk you up. This will help to keep you from overeating.  Try a relaxation exercise to calm you down and decrease your stress. Remember, you may be tense and nervous the first two weeks after you quit. This will pass.  Find new activities to keep your hands busy. Play with a pen, coin, or rubber band. Doodle or draw things on paper.  Brush your teeth right after eating. This will help cut down the craving for the taste of tobacco after meals. You can try mouthwash too.  Try gum, breath mints, or diet candy to keep something in your mouth. IF YOU SMOKE AND WANT TO QUIT:  Do not stock up on cigarettes. Never buy a carton. Wait until one pack is finished before you buy another.  Never carry cigarettes with you at work or at home.  Keep cigarettes  as far away from you as possible. Leave them with someone else.  Never carry matches or a lighter with you.  Ask yourself, "Do I need this cigarette or is this just a reflex?"  Bet with someone that you can quit. Put cigarette money in a piggy bank every morning. If you smoke, you give up the money. If you do not smoke, by the end of the week, you keep the money.  Keep trying. It takes 21 days to change a habit!  Talk to your doctor about using medicines to help you quit. These include nicotine replacement gum, lozenges, or skin patches. Document Released: 05/14/2009 Document Revised: 10/10/2011 Document Reviewed: 05/14/2009 Exit[employer] Patient Information 2014 [employer].  

## 2013-12-24 NOTE — Telephone Encounter (Signed)
gv adnprinted appt sched and avs for pt for Aug and Sept....Erin Kitchensed gv pt barium

## 2013-12-24 NOTE — Patient Instructions (Signed)
[facility] Discharge Instructions for Patients Receiving Chemotherapy  Today you received the following chemotherapy agents Rituxan.  To help prevent nausea and vomiting after your treatment, we encourage you to take your nausea medication as prescribed.   If you develop nausea and vomiting that is not controlled by your nausea medication, call the clinic.   BELOW ARE SYMPTOMS THAT SHOULD BE REPORTED IMMEDIATELY:  *FEVER GREATER THAN 100.5 F  *CHILLS WITH OR WITHOUT FEVER  NAUSEA AND VOMITING THAT IS NOT CONTROLLED WITH YOUR NAUSEA MEDICATION  *UNUSUAL SHORTNESS OF BREATH  *UNUSUAL BRUISING OR BLEEDING  TENDERNESS IN MOUTH AND THROAT WITH OR WITHOUT PRESENCE OF ULCERS  *URINARY PROBLEMS  *BOWEL PROBLEMS  UNUSUAL RASH Items with * indicate a potential emergency and should be followed up as soon as possible.  Feel free to call the clinic you have any questions or concerns. The clinic phone number is [phone].    

## 2013-12-24 NOTE — Progress Notes (Signed)
[place] [phone]   [phone]) [phone]  OFFICE PROGRESS NOTE  Donnajean Lopes, MD [place] [address]  DIAGNOSIS:  1. Castleman's Disease  2. Idiopathic Thrombocytopenic Purpura  3. 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  2) Weekly rituximab at 375 mg per meter squared with first cycle given in the hospital. Now status post 2 partial cycles and 7 complete cycles.   CURRENT THERAPY: Maintenance Rituxan 375 mg/M2 every 3 months, status post 11 cycles.  INTERVAL HISTORY: [name] 58 y.o. female returns to the clinic today for followup visit. The patient is feeling fine today with no specific complaints. She denied having any significant chest pain, shortness of breath, cough or hemoptysis. She denied having any palpable lymphadenopathy. She has no weight loss or night sweats. She has no fever or chills and no nausea or vomiting. She completed 11 cycle of her treatment with rituximab and tolerating it fairly well except for mild fatigue for 1-2 weeks after her treatment. She is here today to start cycle #12.  MEDICAL HISTORY: Past Medical History  Diagnosis Date  . Hypothyroidism   . Heart attack 2007  . Chronic kidney disease     failure  . Heart attack 2007  . Retroperitoneal lymphadenopathy 2012  . Castleman's disease   . ITP (idiopathic thrombocytopenic purpura) 06/17/2011    ALLERGIES:  is allergic to cefdinir; levofloxacin; and vicodin.  MEDICATIONS:  Current Outpatient Prescriptions  Medication Sig Dispense Refill  . ALPRAZolam (XANAX) 0.25 MG tablet Take 0.25 mg by mouth at bedtime as needed. For sleep      . CRESTOR 10 MG tablet       . ibuprofen (ADVIL,MOTRIN) 200 MG tablet Take 400 mg by mouth every 6 (six) hours as needed for pain or headache.      .  levothyroxine (SYNTHROID, LEVOTHROID) 112 MCG tablet Take 112 mcg by mouth daily before breakfast.       . metoprolol succinate (TOPROL-XL) 25 MG 24 hr tablet Take 50 mg by mouth 2 (two) times daily.       . prochlorperazine (COMPAZINE) 10 MG tablet Take 1 tablet (10 mg total) by mouth every 6 (six) hours as needed (for nausea and vomiting).  30 tablet  0  . RiTUXimab (RITUXAN IV) Inject 1 application into the vein every 8 (eight) weeks. Through port in chest  at [facility] [place] last dose was 10/23/12       No current facility-administered medications for this visit.   Facility-Administered Medications Ordered in Other Visits  Medication Dose Route Frequency Provider Last Rate Last Dose  . sodium chloride 0.9 % injection 10 mL  10 mL Intracatheter PRN Curt Bears, MD        SURGICAL HISTORY:  Past Surgical History  Procedure Laterality Date  . Cesarean section  1983 1989  . Appendectomy      58 years old    REVIEW OF SYSTEMS:  Constitutional: negative Eyes: negative Ears, nose, mouth, throat, and face: negative Respiratory: negative Cardiovascular: negative Gastrointestinal: negative Genitourinary:negative Integument/breast: negative Hematologic/lymphatic: negative Musculoskeletal:negative Neurological: negative Behavioral/Psych: negative Endocrine: negative Allergic/Immunologic: negative   PHYSICAL EXAMINATION: General appearance: alert, cooperative and no distress Head: Normocephalic, without obvious abnormality, atraumatic Neck: no adenopathy Lymph nodes: Cervical, supraclavicular, and axillary nodes normal. Resp: clear to auscultation bilaterally Back:  symmetric, no curvature. ROM normal. No CVA tenderness. Cardio: regular rate and rhythm, S1, S2 normal, no murmur, click, rub or gallop GI: soft, non-tender; bowel sounds normal; no masses,  no organomegaly Extremities: extremities normal, atraumatic, no cyanosis or edema Neurologic: Alert and  oriented X 3, normal strength and tone. Normal symmetric reflexes. Normal coordination and gait  ECOG PERFORMANCE STATUS: 1 - Symptomatic but completely ambulatory  Blood pressure 139/78, pulse 75, temperature 97.7 F (36.5 C), temperature source Oral, resp. rate 18, height 5\' 4"  (1.626 m), weight 194 lb 4.8 oz (88.134 kg).  LABORATORY DATA: Lab Results  Component Value Date   WBC 8.0 12/24/2013   HGB 14.4 12/24/2013   HCT 43.3 12/24/2013   MCV 91.2 12/24/2013   PLT 335 12/24/2013      Chemistry      Component Value Date/Time   NA 142 09/24/2013 0916   NA 138 02/28/2012 0840   NA 143 12/13/2010 0957   K 4.5 09/24/2013 0916   K 4.7 02/28/2012 0840   K 4.3 12/13/2010 0957   CL 106 01/18/2013 0851   CL 107 02/28/2012 0840   CL 99 12/13/2010 0957   CO2 27 09/24/2013 0916   CO2 22 02/28/2012 0840   CO2 26 12/13/2010 0957   BUN 12.6 09/24/2013 0916   BUN 14 02/28/2012 0840   BUN 8 12/13/2010 0957   CREATININE 0.8 09/24/2013 0916   CREATININE 0.61 02/28/2012 0840   CREATININE 1.16* 05/31/2011 1530   CREATININE 0.7 12/13/2010 0957      Component Value Date/Time   CALCIUM 9.8 09/24/2013 0916   CALCIUM 9.4 02/28/2012 0840   CALCIUM 8.9 12/13/2010 0957   ALKPHOS 123 09/24/2013 0916   ALKPHOS 101 02/28/2012 0840   ALKPHOS 94* 12/13/2010 0957   AST 19 09/24/2013 0916   AST 15 02/28/2012 0840   AST 21 12/13/2010 0957   ALT 21 09/24/2013 0916   ALT 11 02/28/2012 0840   ALT 13 12/13/2010 0957   BILITOT 0.33 09/24/2013 0916   BILITOT 0.3 02/28/2012 0840   BILITOT 0.50 12/13/2010 0957       RADIOGRAPHIC STUDIES:  ASSESSMENT AND PLAN: This is a very pleasant 58 years old white female with Castleman's disease currently on treatment with Rituxan every 3 months with good control of her disease. Her recent scan showed no evidence for disease progression. I discussed the scan results with the patient today. I recommended for the patient to receive of cycle #12  today as scheduled.  I also discussed with the  patient the possibility of treatment in the future with the newly FDA approved treatment for Castleman disease with Sylvant (Siltuximab), but this is currently on hold and will be used if she has evidence for disease progression. She would come back for followup visit in 3 months after repeating CT scan of the chest, abdomen and pelvis for restaging of her disease. She was advised to call immediately if she has any concerning symptoms in the interval.  All questions were answered. The patient knows to call the clinic with any problems, questions or concerns. We can certainly see the patient much sooner if necessary.  Disclaimer: This note was dictated with voice recognition software. Similar sounding words can inadvertently be transcribed and may not be corrected upon review.

## 2014-03-25 ENCOUNTER — Ambulatory Visit (HOSPITAL_COMMUNITY)
Admission: RE | Admit: 2014-03-25 | Discharge: 2014-03-25 | Disposition: A | Discharge status: Home / Self Care | Payer: Medicare HMO | Source: Ambulatory Visit | Attending: Internal Medicine | Admitting: Internal Medicine

## 2014-03-25 ENCOUNTER — Encounter (HOSPITAL_COMMUNITY): Payer: Self-pay

## 2014-03-25 ENCOUNTER — Other Ambulatory Visit (HOSPITAL_BASED_OUTPATIENT_CLINIC_OR_DEPARTMENT_OTHER): Payer: Medicare HMO

## 2014-03-25 DIAGNOSIS — K5730 Diverticulosis of large intestine without perforation or abscess without bleeding: Secondary | ICD-10-CM | POA: Diagnosis not present

## 2014-03-25 DIAGNOSIS — R599 Enlarged lymph nodes, unspecified: Secondary | ICD-10-CM

## 2014-03-25 DIAGNOSIS — D47Z2 Castleman disease: Secondary | ICD-10-CM

## 2014-03-25 DIAGNOSIS — K8020 Calculus of gallbladder without cholecystitis without obstruction: Secondary | ICD-10-CM | POA: Insufficient documentation

## 2014-03-25 DIAGNOSIS — D693 Immune thrombocytopenic purpura: Secondary | ICD-10-CM

## 2014-03-25 DIAGNOSIS — I2510 Atherosclerotic heart disease of native coronary artery without angina pectoris: Secondary | ICD-10-CM | POA: Insufficient documentation

## 2014-03-25 LAB — CBC WITH DIFFERENTIAL/PLATELET
BASO%: 1.1 % (ref 0.0–2.0)
BASOS ABS: 0.1 10*3/uL (ref 0.0–0.1)
EOS%: 2.6 % (ref 0.0–7.0)
Eosinophils Absolute: 0.2 10*3/uL (ref 0.0–0.5)
HCT: 41.6 % (ref 34.8–46.6)
HEMOGLOBIN: 13.8 g/dL (ref 11.6–15.9)
LYMPH%: 25 % (ref 14.0–49.7)
MCH: 30.2 pg (ref 25.1–34.0)
MCHC: 33.1 g/dL (ref 31.5–36.0)
MCV: 91.4 fL (ref 79.5–101.0)
MONO#: 0.7 10*3/uL (ref 0.1–0.9)
MONO%: 7.6 % (ref 0.0–14.0)
NEUT#: 6 10*3/uL (ref 1.5–6.5)
NEUT%: 63.7 % (ref 38.4–76.8)
Platelets: 315 10*3/uL (ref 145–400)
RBC: 4.55 10*6/uL (ref 3.70–5.45)
RDW: 13 % (ref 11.2–14.5)
WBC: 9.4 10*3/uL (ref 3.9–10.3)
lymph#: 2.4 10*3/uL (ref 0.9–3.3)

## 2014-03-25 LAB — COMPREHENSIVE METABOLIC PANEL (CC13)
ALT: 11 U/L (ref 0–55)
AST: 15 U/L (ref 5–34)
Albumin: 3.9 g/dL (ref 3.5–5.0)
Alkaline Phosphatase: 108 U/L (ref 40–150)
Anion Gap: 7 mEq/L (ref 3–11)
BILIRUBIN TOTAL: 0.25 mg/dL (ref 0.20–1.20)
BUN: 10 mg/dL (ref 7.0–26.0)
CO2: 29 mEq/L (ref 22–29)
Calcium: 9.4 mg/dL (ref 8.4–10.4)
Chloride: 107 mEq/L (ref 98–109)
Creatinine: 0.7 mg/dL (ref 0.6–1.1)
Glucose: 90 mg/dl (ref 70–140)
Potassium: 3.9 mEq/L (ref 3.5–5.1)
Sodium: 143 mEq/L (ref 136–145)
Total Protein: 6.7 g/dL (ref 6.4–8.3)

## 2014-03-25 MED ORDER — IOHEXOL 300 MG/ML  SOLN
100.0000 mL | Freq: Once | INTRAMUSCULAR | Status: AC | PRN
  Administered 2014-03-25: 100 mL via INTRAVENOUS

## 2014-04-01 ENCOUNTER — Telehealth: Payer: Self-pay | Admitting: Internal Medicine

## 2014-04-01 ENCOUNTER — Ambulatory Visit (HOSPITAL_BASED_OUTPATIENT_CLINIC_OR_DEPARTMENT_OTHER): Payer: Medicare HMO | Admitting: Internal Medicine

## 2014-04-01 ENCOUNTER — Encounter: Payer: Self-pay | Admitting: Internal Medicine

## 2014-04-01 VITALS — BP 134/89 | HR 70 | Temp 97.8°F | Resp 19 | Ht 64.0 in | Wt 193.6 lb

## 2014-04-01 DIAGNOSIS — D47Z2 Castleman disease: Secondary | ICD-10-CM

## 2014-04-01 DIAGNOSIS — R599 Enlarged lymph nodes, unspecified: Secondary | ICD-10-CM

## 2014-04-01 NOTE — Progress Notes (Signed)
[facility] [phone]   [phone]) [phone]  OFFICE PROGRESS NOTE  Donnajean Lopes, MD [place] [address]  DIAGNOSIS:  1. Castleman's Disease  2. Idiopathic Thrombocytopenic Purpura  3. 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  2) Weekly rituximab at 375 mg per meter squared with first cycle given in the hospital. Now status post 2 partial cycles and 7 complete cycles.   CURRENT THERAPY: Maintenance Rituxan 375 mg/M2 every 3 months, status post 12 cycles.  INTERVAL HISTORY: [name] 58 y.o. female returns to the clinic today for followup visit. The patient is feeling fine today with no specific complaints. The patient mentions that she has never felt as good as she is right now.  She denied having any significant chest pain, shortness of breath, cough or hemoptysis. She denied having any palpable lymphadenopathy. She has no weight loss or night sweats. She has no fever or chills and no nausea or vomiting. She completed 12 cycle of her treatment with rituximab and tolerating it fairly well. She had repeat CT scan of the chest, abdomen and pelvis performed recently and she is here for evaluation and discussion of her scan results.  MEDICAL HISTORY: Past Medical History  Diagnosis Date  . Hypothyroidism   . Heart attack 2007  . Chronic kidney disease     failure  . Heart attack 2007  . Retroperitoneal lymphadenopathy 2012  . Castleman's disease   . ITP (idiopathic thrombocytopenic purpura) 06/17/2011    ALLERGIES:  is allergic to cefdinir; levofloxacin; and vicodin.  MEDICATIONS:  Current Outpatient Prescriptions  Medication Sig Dispense Refill  . ALPRAZolam (XANAX) 0.25 MG tablet Take 0.25 mg by mouth at bedtime as needed. For sleep      . CRESTOR 10 MG tablet       . ibuprofen  (ADVIL,MOTRIN) 200 MG tablet Take 400 mg by mouth every 6 (six) hours as needed for pain or headache.      . levothyroxine (SYNTHROID, LEVOTHROID) 112 MCG tablet Take 112 mcg by mouth daily before breakfast.       . metoprolol succinate (TOPROL-XL) 25 MG 24 hr tablet Take 50 mg by mouth 2 (two) times daily.       . prochlorperazine (COMPAZINE) 10 MG tablet Take 1 tablet (10 mg total) by mouth every 6 (six) hours as needed (for nausea and vomiting).  30 tablet  0  . RiTUXimab (RITUXAN IV) Inject 1 application into the vein every 8 (eight) weeks. Through port in chest  at [facility] [place] last dose was 10/23/12       No current facility-administered medications for this visit.   Facility-Administered Medications Ordered in Other Visits  Medication Dose Route Frequency Provider Last Rate Last Dose  . sodium chloride 0.9 % injection 10 mL  10 mL Intracatheter PRN Curt Bears, MD        SURGICAL HISTORY:  Past Surgical History  Procedure Laterality Date  . Cesarean section  1983 1989  . Appendectomy      58 years old    REVIEW OF SYSTEMS:  Constitutional: negative Eyes: negative Ears, nose, mouth, throat, and face: negative Respiratory: negative Cardiovascular: negative Gastrointestinal: negative Genitourinary:negative Integument/breast: negative Hematologic/lymphatic: negative Musculoskeletal:negative Neurological: negative Behavioral/Psych: negative Endocrine: negative Allergic/Immunologic: negative   PHYSICAL EXAMINATION: General appearance: alert, cooperative and no distress  Head: Normocephalic, without obvious abnormality, atraumatic Neck: no adenopathy Lymph nodes: Cervical, supraclavicular, and axillary nodes normal. Resp: clear to auscultation bilaterally Back: symmetric, no curvature. ROM normal. No CVA tenderness. Cardio: regular rate and rhythm, S1, S2 normal, no murmur, click, rub or gallop GI: soft, non-tender; bowel sounds normal; no  masses,  no organomegaly Extremities: extremities normal, atraumatic, no cyanosis or edema Neurologic: Alert and oriented X 3, normal strength and tone. Normal symmetric reflexes. Normal coordination and gait  ECOG PERFORMANCE STATUS: 1 - Symptomatic but completely ambulatory  Blood pressure 134/89, pulse 70, temperature 97.8 F (36.6 C), temperature source Oral, resp. rate 19, height 5\' 4"  (1.626 m), weight 193 lb 9.6 oz (87.816 kg).  LABORATORY DATA: Lab Results  Component Value Date   WBC 9.4 03/25/2014   HGB 13.8 03/25/2014   HCT 41.6 03/25/2014   MCV 91.4 03/25/2014   PLT 315 03/25/2014      Chemistry      Component Value Date/Time   NA 143 03/25/2014 1315   NA 138 02/28/2012 0840   NA 143 12/13/2010 0957   K 3.9 03/25/2014 1315   K 4.7 02/28/2012 0840   K 4.3 12/13/2010 0957   CL 106 01/18/2013 0851   CL 107 02/28/2012 0840   CL 99 12/13/2010 0957   CO2 29 03/25/2014 1315   CO2 22 02/28/2012 0840   CO2 26 12/13/2010 0957   BUN 10.0 03/25/2014 1315   BUN 14 02/28/2012 0840   BUN 8 12/13/2010 0957   CREATININE 0.7 03/25/2014 1315   CREATININE 0.61 02/28/2012 0840   CREATININE 1.16* 05/31/2011 1530   CREATININE 0.7 12/13/2010 0957      Component Value Date/Time   CALCIUM 9.4 03/25/2014 1315   CALCIUM 9.4 02/28/2012 0840   CALCIUM 8.9 12/13/2010 0957   ALKPHOS 108 03/25/2014 1315   ALKPHOS 101 02/28/2012 0840   ALKPHOS 94* 12/13/2010 0957   AST 15 03/25/2014 1315   AST 15 02/28/2012 0840   AST 21 12/13/2010 0957   ALT 11 03/25/2014 1315   ALT 11 02/28/2012 0840   ALT 13 12/13/2010 0957   BILITOT 0.25 03/25/2014 1315   BILITOT 0.3 02/28/2012 0840   BILITOT 0.50 12/13/2010 0957       RADIOGRAPHIC STUDIES: Ct Chest W Contrast  03/25/2014   CLINICAL DATA:  58 year old female with history of Castleman's disease status post chemotherapy completed in May 2015.  EXAM: CT CHEST, ABDOMEN, AND PELVIS WITH CONTRAST  TECHNIQUE: Multidetector CT imaging of the chest, abdomen and pelvis was performed  following the standard protocol during bolus administration of intravenous contrast.  CONTRAST:  14mL OMNIPAQUE IOHEXOL 300 MG/ML  SOLN  COMPARISON:  CT of the chest, abdomen and pelvis 06/14/2013.  FINDINGS: CT CHEST FINDINGS  Mediastinum: Heart size is normal. There is no significant pericardial fluid, thickening or pericardial calcification. There is atherosclerosis of the thoracic aorta, the great vessels of the mediastinum and the coronary arteries, including calcified atherosclerotic plaque in the right coronary artery. No pathologically enlarged mediastinal or hilar lymph nodes. Esophagus is unremarkable in appearance. Separate origin of the left vertebral artery directly off the aortic arch (normal anatomical variant) incidentally noted.  Lungs/Pleura: No acute consolidative airspace disease. No pleural effusions. No suspicious appearing pulmonary nodules or masses are identified. Mild diffuse bronchial wall thickening with mild to moderate centrilobular emphysema.  Musculoskeletal: There are no aggressive appearing lytic or blastic lesions noted in the visualized portions of the skeleton.  CT ABDOMEN AND PELVIS FINDINGS  Abdomen/Pelvis: Compared to the prior examination, there are again several prominent borderline enlarged retroperitoneal lymph nodes, however, none of these currently meet CT criteria for pathologic enlargement. The largest single retroperitoneal lymph node measures 9 mm in short axis in the left para-aortic station (image 73 of series 2). No new or enlarging lymphadenopathy is otherwise identified in the abdomen or pelvis.  Atherosclerosis throughout the abdominal and pelvic vasculature, without evidence of aneurysm or dissection. Numerous tiny calcified gallstones lying dependently in the gallbladder. No current findings to suggest an acute cholecystitis at this time. The appearance of the liver, pancreas, spleen, bilateral adrenal glands and bilateral kidneys is unremarkable. No  significant volume of ascites. No pneumoperitoneum. No pathologic distention of small bowel. Numerous colonic diverticulae, without surrounding inflammatory changes to suggest an acute diverticulitis at this time. Uterus and ovaries are unremarkable in appearance. Urinary bladder is normal in appearance.  Musculoskeletal: There are no aggressive appearing lytic or blastic lesions noted in the visualized portions of the skeleton.  IMPRESSION: 1. Today's study demonstrates continued positive response to therapy with regression of previously noted retroperitoneal lymphadenopathy. At this time, there are no enlarged lymph nodes in the chest, abdomen or pelvis. 2. Mild diffuse bronchial wall thickening with mild to moderate centrilobular emphysema; imaging findings suggestive of underlying COPD. 3. Atherosclerosis, including right coronary artery disease. Please note that although the presence of coronary artery calcium documents the presence of coronary artery disease, the severity of this disease and any potential stenosis cannot be assessed on this non-gated CT examination. Assessment for potential risk factor modification, dietary therapy or pharmacologic therapy may be warranted, if clinically indicated. 4. Colonic diverticulosis without findings to suggest acute diverticulitis at this time. 5. Cholelithiasis without evidence of acute cholecystitis.   Electronically Signed   By: Vinnie Langton M.D.   On: 03/25/2014 15:51   Ct Abdomen Pelvis W Contrast  03/25/2014   CLINICAL DATA:  58 year old female with history of Castleman's disease status post chemotherapy completed in May 2015.  EXAM: CT CHEST, ABDOMEN, AND PELVIS WITH CONTRAST  TECHNIQUE: Multidetector CT imaging of the chest, abdomen and pelvis was performed following the standard protocol during bolus administration of intravenous contrast.  CONTRAST:  181mL OMNIPAQUE IOHEXOL 300 MG/ML  SOLN  COMPARISON:  CT of the chest, abdomen and pelvis 06/14/2013.   FINDINGS: CT CHEST FINDINGS  Mediastinum: Heart size is normal. There is no significant pericardial fluid, thickening or pericardial calcification. There is atherosclerosis of the thoracic aorta, the great vessels of the mediastinum and the coronary arteries, including calcified atherosclerotic plaque in the right coronary artery. No pathologically enlarged mediastinal or hilar lymph nodes. Esophagus is unremarkable in appearance. Separate origin of the left vertebral artery directly off the aortic arch (normal anatomical variant) incidentally noted.  Lungs/Pleura: No acute consolidative airspace disease. No pleural effusions. No suspicious appearing pulmonary nodules or masses are identified. Mild diffuse bronchial wall thickening with mild to moderate centrilobular emphysema.  Musculoskeletal: There are no aggressive appearing lytic or blastic lesions noted in the visualized portions of the skeleton.  CT ABDOMEN AND PELVIS FINDINGS  Abdomen/Pelvis: Compared to the prior examination, there are again several prominent borderline enlarged retroperitoneal lymph nodes, however, none of these currently meet CT criteria for pathologic enlargement. The largest single retroperitoneal lymph node measures 9 mm in short axis in the left para-aortic station (image 73 of series 2). No new or enlarging lymphadenopathy is otherwise identified in the abdomen or pelvis.  Atherosclerosis throughout the abdominal and pelvic  vasculature, without evidence of aneurysm or dissection. Numerous tiny calcified gallstones lying dependently in the gallbladder. No current findings to suggest an acute cholecystitis at this time. The appearance of the liver, pancreas, spleen, bilateral adrenal glands and bilateral kidneys is unremarkable. No significant volume of ascites. No pneumoperitoneum. No pathologic distention of small bowel. Numerous colonic diverticulae, without surrounding inflammatory changes to suggest an acute diverticulitis at this  time. Uterus and ovaries are unremarkable in appearance. Urinary bladder is normal in appearance.  Musculoskeletal: There are no aggressive appearing lytic or blastic lesions noted in the visualized portions of the skeleton.  IMPRESSION: 1. Today's study demonstrates continued positive response to therapy with regression of previously noted retroperitoneal lymphadenopathy. At this time, there are no enlarged lymph nodes in the chest, abdomen or pelvis. 2. Mild diffuse bronchial wall thickening with mild to moderate centrilobular emphysema; imaging findings suggestive of underlying COPD. 3. Atherosclerosis, including right coronary artery disease. Please note that although the presence of coronary artery calcium documents the presence of coronary artery disease, the severity of this disease and any potential stenosis cannot be assessed on this non-gated CT examination. Assessment for potential risk factor modification, dietary therapy or pharmacologic therapy may be warranted, if clinically indicated. 4. Colonic diverticulosis without findings to suggest acute diverticulitis at this time. 5. Cholelithiasis without evidence of acute cholecystitis.   Electronically Signed   By: Vinnie Langton M.D.   On: 03/25/2014 15:51   ASSESSMENT AND PLAN: This is a very pleasant 58 years old white female with Castleman's disease. She completed 12 cycles of maintenance treatment with Rituxan every 3 months and tolerated her treatment fairly well with no significant adverse effects. Her recent CT scan of the chest, abdomen and pelvis showed further improvement in the retroperitoneal lymphadenopathy with no other evidence of progressive disease. I discussed the scan results with the patient today. I recommended for her to continue on observation with close monitoring and repeat CT scan of the chest, abdomen and pelvis in 6 months. She was advised to call immediately if she has any concerning symptoms in the interval.  All  questions were answered. The patient knows to call the clinic with any problems, questions or concerns. We can certainly see the patient much sooner if necessary.  Disclaimer: This note was dictated with voice recognition software. Similar sounding words can inadvertently be transcribed and may not be corrected upon review.

## 2014-04-01 NOTE — Telephone Encounter (Signed)
, °

## 2014-09-19 ENCOUNTER — Telehealth: Payer: Self-pay | Admitting: *Deleted

## 2014-09-19 NOTE — Telephone Encounter (Signed)
error 

## 2014-09-29 ENCOUNTER — Other Ambulatory Visit (HOSPITAL_BASED_OUTPATIENT_CLINIC_OR_DEPARTMENT_OTHER): Payer: Medicare HMO

## 2014-09-29 ENCOUNTER — Ambulatory Visit (HOSPITAL_COMMUNITY)
Admission: RE | Admit: 2014-09-29 | Discharge: 2014-09-29 | Disposition: A | Discharge status: Home / Self Care | Payer: Medicare HMO | Source: Ambulatory Visit | Attending: Internal Medicine | Admitting: Internal Medicine

## 2014-09-29 ENCOUNTER — Encounter (HOSPITAL_COMMUNITY): Payer: Self-pay

## 2014-09-29 DIAGNOSIS — R0602 Shortness of breath: Secondary | ICD-10-CM | POA: Insufficient documentation

## 2014-09-29 DIAGNOSIS — R599 Enlarged lymph nodes, unspecified: Secondary | ICD-10-CM | POA: Diagnosis not present

## 2014-09-29 DIAGNOSIS — Z9221 Personal history of antineoplastic chemotherapy: Secondary | ICD-10-CM | POA: Diagnosis not present

## 2014-09-29 DIAGNOSIS — D47Z2 Castleman disease: Secondary | ICD-10-CM

## 2014-09-29 DIAGNOSIS — D693 Immune thrombocytopenic purpura: Secondary | ICD-10-CM

## 2014-09-29 HISTORY — DX: Essential (primary) hypertension: I10

## 2014-09-29 LAB — CBC WITH DIFFERENTIAL/PLATELET
BASO%: 0.2 % (ref 0.0–2.0)
Basophils Absolute: 0 10*3/uL (ref 0.0–0.1)
EOS ABS: 0.3 10*3/uL (ref 0.0–0.5)
EOS%: 2.6 % (ref 0.0–7.0)
HCT: 44.6 % (ref 34.8–46.6)
HGB: 14.6 g/dL (ref 11.6–15.9)
LYMPH%: 27.2 % (ref 14.0–49.7)
MCH: 30.2 pg (ref 25.1–34.0)
MCHC: 32.8 g/dL (ref 31.5–36.0)
MCV: 92 fL (ref 79.5–101.0)
MONO#: 0.7 10*3/uL (ref 0.1–0.9)
MONO%: 6.7 % (ref 0.0–14.0)
NEUT#: 6.6 10*3/uL — ABNORMAL HIGH (ref 1.5–6.5)
NEUT%: 63.3 % (ref 38.4–76.8)
PLATELETS: 361 10*3/uL (ref 145–400)
RBC: 4.84 10*6/uL (ref 3.70–5.45)
RDW: 13.2 % (ref 11.2–14.5)
WBC: 10.5 10*3/uL — ABNORMAL HIGH (ref 3.9–10.3)
lymph#: 2.9 10*3/uL (ref 0.9–3.3)

## 2014-09-29 LAB — COMPREHENSIVE METABOLIC PANEL (CC13)
ALK PHOS: 114 U/L (ref 40–150)
ALT: 16 U/L (ref 0–55)
ANION GAP: 8 meq/L (ref 3–11)
AST: 18 U/L (ref 5–34)
Albumin: 4.2 g/dL (ref 3.5–5.0)
BILIRUBIN TOTAL: 0.26 mg/dL (ref 0.20–1.20)
BUN: 15.1 mg/dL (ref 7.0–26.0)
CO2: 25 meq/L (ref 22–29)
Calcium: 9.6 mg/dL (ref 8.4–10.4)
Chloride: 106 mEq/L (ref 98–109)
Creatinine: 0.8 mg/dL (ref 0.6–1.1)
EGFR: 83 mL/min/{1.73_m2} — AB (ref >90)
GLUCOSE: 99 mg/dL (ref 70–140)
Potassium: 4.2 mEq/L (ref 3.5–5.1)
SODIUM: 139 meq/L (ref 136–145)
TOTAL PROTEIN: 7.2 g/dL (ref 6.4–8.3)

## 2014-09-29 LAB — LACTATE DEHYDROGENASE (CC13): LDH: 136 U/L (ref 125–245)

## 2014-09-29 MED ORDER — IOHEXOL 300 MG/ML  SOLN
100.0000 mL | Freq: Once | INTRAMUSCULAR | Status: AC | PRN
  Administered 2014-09-29: 100 mL via INTRAVENOUS

## 2014-10-01 ENCOUNTER — Encounter: Payer: Self-pay | Admitting: Internal Medicine

## 2014-10-01 ENCOUNTER — Telehealth: Payer: Self-pay | Admitting: Internal Medicine

## 2014-10-01 ENCOUNTER — Ambulatory Visit (HOSPITAL_BASED_OUTPATIENT_CLINIC_OR_DEPARTMENT_OTHER): Payer: Medicare HMO | Admitting: Internal Medicine

## 2014-10-01 VITALS — BP 131/70 | HR 78 | Temp 98.0°F | Resp 19 | Ht 64.0 in | Wt 194.8 lb

## 2014-10-01 DIAGNOSIS — D693 Immune thrombocytopenic purpura: Secondary | ICD-10-CM

## 2014-10-01 DIAGNOSIS — D47Z2 Castleman disease: Secondary | ICD-10-CM

## 2014-10-01 DIAGNOSIS — R599 Enlarged lymph nodes, unspecified: Secondary | ICD-10-CM

## 2014-10-01 NOTE — Telephone Encounter (Signed)
Gave avs & calendar for August. Also gave CT contrast for scan.

## 2014-10-01 NOTE — Progress Notes (Signed)
[facility] [phone]   Fax:(336) [phone]  OFFICE PROGRESS NOTE  Donnajean Lopes, MD [place] [address]  DIAGNOSIS:  1. Castleman's Disease  2. Idiopathic Thrombocytopenic Purpura  3. 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  2) Weekly rituximab at 375 mg per meter squared with first cycle given in the hospital. Now status post 2 partial cycles and 7 complete cycles.   CURRENT THERAPY: Maintenance Rituxan 375 mg/M2 every 3 months, status post 12 cycles.  INTERVAL HISTORY: [name] 59 y.o. female returns to the clinic today for followup visit. The patient is feeling fine today with no specific complaints. She denied having any significant chest pain, shortness of breath, cough or hemoptysis. She denied having any palpable lymphadenopathy. She has no weight loss or night sweats. She has no fever or chills and no nausea or vomiting. She had repeat CT scan of the chest, abdomen and pelvis performed recently and she is here for evaluation and discussion of her scan results.  MEDICAL HISTORY: Past Medical History  Diagnosis Date  . Hypothyroidism   . Heart attack 2007  . Chronic kidney disease     failure  . Heart attack 2007  . Retroperitoneal lymphadenopathy 2012  . Castleman's disease   . ITP (idiopathic thrombocytopenic purpura) 06/17/2011  . Hypertension     ALLERGIES:  is allergic to cefdinir; levaquin; levofloxacin; and vicodin.  MEDICATIONS:  Current Outpatient Prescriptions  Medication Sig Dispense Refill  . ALPRAZolam (XANAX) 0.25 MG tablet Take 0.25 mg by mouth at bedtime as needed. For sleep    . CRESTOR 10 MG tablet     . ibuprofen (ADVIL,MOTRIN) 200 MG tablet Take 400 mg by mouth every 6 (six) hours as needed for pain or headache.    . levothyroxine (SYNTHROID,  LEVOTHROID) 112 MCG tablet Take 112 mcg by mouth daily before breakfast.     . metoprolol succinate (TOPROL-XL) 25 MG 24 hr tablet Take 50 mg by mouth 2 (two) times daily.     . prochlorperazine (COMPAZINE) 10 MG tablet Take 1 tablet (10 mg total) by mouth every 6 (six) hours as needed (for nausea and vomiting). 30 tablet 0  . RiTUXimab (RITUXAN IV) Inject 1 application into the vein every 8 (eight) weeks. Through port in chest  at [facility] [place] last dose was 10/23/12     No current facility-administered medications for this visit.   Facility-Administered Medications Ordered in Other Visits  Medication Dose Route Frequency Provider Last Rate Last Dose  . sodium chloride 0.9 % injection 10 mL  10 mL Intracatheter PRN Curt Bears, MD        SURGICAL HISTORY:  Past Surgical History  Procedure Laterality Date  . Cesarean section  1983 1989  . Appendectomy      59 years old    REVIEW OF SYSTEMS:  Constitutional: negative Eyes: negative Ears, nose, mouth, throat, and face: negative Respiratory: negative Cardiovascular: negative Gastrointestinal: negative Genitourinary:negative Integument/breast: negative Hematologic/lymphatic: negative Musculoskeletal:negative Neurological: negative Behavioral/Psych: negative Endocrine: negative Allergic/Immunologic: negative   PHYSICAL EXAMINATION: General appearance: alert, cooperative and no distress Head: Normocephalic, without obvious abnormality, atraumatic Neck: no adenopathy Lymph nodes: Cervical, supraclavicular, and axillary nodes normal. Resp: clear to auscultation bilaterally Back: symmetric, no curvature. ROM normal. No CVA tenderness. Cardio: regular rate and rhythm, S1, S2 normal,  no murmur, click, rub or gallop GI: soft, non-tender; bowel sounds normal; no masses,  no organomegaly Extremities: extremities normal, atraumatic, no cyanosis or edema Neurologic: Alert and oriented X 3, normal strength and  tone. Normal symmetric reflexes. Normal coordination and gait  ECOG PERFORMANCE STATUS: 1 - Symptomatic but completely ambulatory  There were no vitals taken for this visit.  LABORATORY DATA: Lab Results  Component Value Date   WBC 10.5* 09/29/2014   HGB 14.6 09/29/2014   HCT 44.6 09/29/2014   MCV 92.0 09/29/2014   PLT 361 09/29/2014      Chemistry      Component Value Date/Time   NA 139 09/29/2014 1403   NA 138 02/28/2012 0840   NA 143 12/13/2010 0957   K 4.2 09/29/2014 1403   K 4.7 02/28/2012 0840   K 4.3 12/13/2010 0957   CL 106 01/18/2013 0851   CL 107 02/28/2012 0840   CL 99 12/13/2010 0957   CO2 25 09/29/2014 1403   CO2 22 02/28/2012 0840   CO2 26 12/13/2010 0957   BUN 15.1 09/29/2014 1403   BUN 14 02/28/2012 0840   BUN 8 12/13/2010 0957   CREATININE 0.8 09/29/2014 1403   CREATININE 0.61 02/28/2012 0840   CREATININE 1.16* 05/31/2011 1530   CREATININE 0.7 12/13/2010 0957      Component Value Date/Time   CALCIUM 9.6 09/29/2014 1403   CALCIUM 9.4 02/28/2012 0840   CALCIUM 8.9 12/13/2010 0957   ALKPHOS 114 09/29/2014 1403   ALKPHOS 101 02/28/2012 0840   ALKPHOS 94* 12/13/2010 0957   AST 18 09/29/2014 1403   AST 15 02/28/2012 0840   AST 21 12/13/2010 0957   ALT 16 09/29/2014 1403   ALT 11 02/28/2012 0840   ALT 13 12/13/2010 0957   BILITOT 0.26 09/29/2014 1403   BILITOT 0.3 02/28/2012 0840   BILITOT 0.50 12/13/2010 0957       RADIOGRAPHIC STUDIES: Ct Chest W Contrast  09/29/2014   CLINICAL DATA:  Castleman's disease, chemotherapy complete. Shortness of breath.  EXAM: CT CHEST, ABDOMEN, AND PELVIS WITH CONTRAST  TECHNIQUE: Multidetector CT imaging of the chest, abdomen and pelvis was performed following the standard protocol during bolus administration of intravenous contrast.  CONTRAST:  151mL OMNIPAQUE IOHEXOL 300 MG/ML  SOLN  COMPARISON:  03/25/2014.  FINDINGS: CT CHEST FINDINGS  CT CHEST FINDINGS  Mediastinum/Nodes: No pathologically enlarged  mediastinal, hilar or axillary lymph nodes. Atherosclerotic calcification of the arterial vasculature, including coronary arteries. Heart size normal. No pericardial effusion.  Lungs/Pleura: Minimal biapical pleural-parenchymal scarring. Mild centrilobular emphysema. Lungs are otherwise clear. No pleural fluid. Airway is unremarkable.  Musculoskeletal: No worrisome lytic or sclerotic lesions.  CT ABDOMEN AND PELVIS FINDINGS  Hepatobiliary: Liver is unremarkable. Small stones layer in the gallbladder. No biliary ductal dilatation.  Pancreas: Negative.  Spleen: Negative.  Adrenals/Urinary Tract: Adrenal glands and kidneys are unremarkable. Ureters are decompressed.  Stomach/Bowel: Stomach, small bowel and colon are unremarkable. Patient is reportedly status post appendectomy.  Vascular/Lymphatic: Atherosclerotic calcification of the arterial vasculature without abdominal aortic aneurysm. No pathologically enlarged lymph nodes.  Reproductive: Uterus and left ovary are visualized. Right ovary is not well visualized.  Other: No free fluid. Tiny periumbilical hernia contains fat. Mesenteries and peritoneum are unremarkable.  Musculoskeletal: No worrisome lytic or sclerotic lesions.  IMPRESSION: 1. No evidence of recurrent disease. 2. Cholelithiasis. 3. Coronary artery calcification.   Electronically Signed   By: Lorin Picket M.D.   On: 09/29/2014 16:11   Ct Abdomen Pelvis  W Contrast  09/29/2014   CLINICAL DATA:  Castleman's disease, chemotherapy complete. Shortness of breath.  EXAM: CT CHEST, ABDOMEN, AND PELVIS WITH CONTRAST  TECHNIQUE: Multidetector CT imaging of the chest, abdomen and pelvis was performed following the standard protocol during bolus administration of intravenous contrast.  CONTRAST:  179mL OMNIPAQUE IOHEXOL 300 MG/ML  SOLN  COMPARISON:  03/25/2014.  FINDINGS: CT CHEST FINDINGS  CT CHEST FINDINGS  Mediastinum/Nodes: No pathologically enlarged mediastinal, hilar or axillary lymph nodes.  Atherosclerotic calcification of the arterial vasculature, including coronary arteries. Heart size normal. No pericardial effusion.  Lungs/Pleura: Minimal biapical pleural-parenchymal scarring. Mild centrilobular emphysema. Lungs are otherwise clear. No pleural fluid. Airway is unremarkable.  Musculoskeletal: No worrisome lytic or sclerotic lesions.  CT ABDOMEN AND PELVIS FINDINGS  Hepatobiliary: Liver is unremarkable. Small stones layer in the gallbladder. No biliary ductal dilatation.  Pancreas: Negative.  Spleen: Negative.  Adrenals/Urinary Tract: Adrenal glands and kidneys are unremarkable. Ureters are decompressed.  Stomach/Bowel: Stomach, small bowel and colon are unremarkable. Patient is reportedly status post appendectomy.  Vascular/Lymphatic: Atherosclerotic calcification of the arterial vasculature without abdominal aortic aneurysm. No pathologically enlarged lymph nodes.  Reproductive: Uterus and left ovary are visualized. Right ovary is not well visualized.  Other: No free fluid. Tiny periumbilical hernia contains fat. Mesenteries and peritoneum are unremarkable.  Musculoskeletal: No worrisome lytic or sclerotic lesions.  IMPRESSION: 1. No evidence of recurrent disease. 2. Cholelithiasis. 3. Coronary artery calcification.   Electronically Signed   By: Lorin Picket M.D.   On: 09/29/2014 16:11   ASSESSMENT AND PLAN: This is a very pleasant 59 years old white female with Castleman's disease. She completed 12 cycles of maintenance treatment with Rituxan every 3 months and tolerated her treatment fairly well with no significant adverse effects. The recent CT scan of the chest, abdomen and pelvis showed no evidence for recurrent disease. I discussed the scan results with the patient today. I recommended for her to continue on observation with close monitoring and repeat CT scan of the chest, abdomen and pelvis in 6 months. She was advised to call immediately if she has any concerning symptoms in the  interval.  All questions were answered. The patient knows to call the clinic with any problems, questions or concerns. We can certainly see the patient much sooner if necessary.  Disclaimer: This note was dictated with voice recognition software. Similar sounding words can inadvertently be transcribed and may not be corrected upon review.

## 2015-03-27 NOTE — Progress Notes (Signed)
This encounter was created in error - please disregard.

## 2015-03-30 ENCOUNTER — Encounter (HOSPITAL_COMMUNITY): Payer: Self-pay

## 2015-03-30 ENCOUNTER — Ambulatory Visit (HOSPITAL_COMMUNITY)
Admission: RE | Admit: 2015-03-30 | Discharge: 2015-03-30 | Disposition: A | Discharge status: Home / Self Care | Payer: Medicare HMO | Source: Ambulatory Visit | Attending: Internal Medicine | Admitting: Internal Medicine

## 2015-03-30 ENCOUNTER — Other Ambulatory Visit (HOSPITAL_BASED_OUTPATIENT_CLINIC_OR_DEPARTMENT_OTHER): Payer: Medicare HMO

## 2015-03-30 ENCOUNTER — Other Ambulatory Visit: Payer: Self-pay | Admitting: Medical Oncology

## 2015-03-30 DIAGNOSIS — D47Z2 Castleman disease: Secondary | ICD-10-CM

## 2015-03-30 DIAGNOSIS — R599 Enlarged lymph nodes, unspecified: Secondary | ICD-10-CM | POA: Diagnosis present

## 2015-03-30 DIAGNOSIS — R591 Generalized enlarged lymph nodes: Secondary | ICD-10-CM

## 2015-03-30 LAB — CBC WITH DIFFERENTIAL/PLATELET
BASO%: 1.5 % (ref 0.0–2.0)
Basophils Absolute: 0.2 10*3/uL — ABNORMAL HIGH (ref 0.0–0.1)
EOS%: 2.4 % (ref 0.0–7.0)
Eosinophils Absolute: 0.3 10*3/uL (ref 0.0–0.5)
HCT: 44.3 % (ref 34.8–46.6)
HGB: 15.3 g/dL (ref 11.6–15.9)
LYMPH%: 28.1 % (ref 14.0–49.7)
MCH: 31.8 pg (ref 25.1–34.0)
MCHC: 34.6 g/dL (ref 31.5–36.0)
MCV: 92 fL (ref 79.5–101.0)
MONO#: 0.6 10*3/uL (ref 0.1–0.9)
MONO%: 6 % (ref 0.0–14.0)
NEUT%: 62 % (ref 38.4–76.8)
NEUTROS ABS: 6.5 10*3/uL (ref 1.5–6.5)
Platelets: 351 10*3/uL (ref 145–400)
RBC: 4.82 10*6/uL (ref 3.70–5.45)
RDW: 12.7 % (ref 11.2–14.5)
WBC: 10.4 10*3/uL — AB (ref 3.9–10.3)
lymph#: 2.9 10*3/uL (ref 0.9–3.3)

## 2015-03-30 LAB — COMPREHENSIVE METABOLIC PANEL (CC13)
ALT: 17 U/L (ref 0–55)
AST: 17 U/L (ref 5–34)
Albumin: 4.4 g/dL (ref 3.5–5.0)
Alkaline Phosphatase: 105 U/L (ref 40–150)
Anion Gap: 9 mEq/L (ref 3–11)
BUN: 14.5 mg/dL (ref 7.0–26.0)
CO2: 28 meq/L (ref 22–29)
Calcium: 9.9 mg/dL (ref 8.4–10.4)
Chloride: 105 mEq/L (ref 98–109)
Creatinine: 0.8 mg/dL (ref 0.6–1.1)
EGFR: 82 mL/min/{1.73_m2} — AB (ref >90)
GLUCOSE: 91 mg/dL (ref 70–140)
Potassium: 4.5 mEq/L (ref 3.5–5.1)
SODIUM: 141 meq/L (ref 136–145)
Total Bilirubin: 0.31 mg/dL (ref 0.20–1.20)
Total Protein: 7.3 g/dL (ref 6.4–8.3)

## 2015-03-30 LAB — LACTATE DEHYDROGENASE (CC13): LDH: 144 U/L (ref 125–245)

## 2015-03-30 MED ORDER — IOHEXOL 300 MG/ML  SOLN
100.0000 mL | Freq: Once | INTRAMUSCULAR | Status: AC | PRN
  Administered 2015-03-30: 100 mL via INTRAVENOUS

## 2015-04-07 ENCOUNTER — Encounter: Payer: Self-pay | Admitting: Internal Medicine

## 2015-04-07 ENCOUNTER — Telehealth: Payer: Self-pay | Admitting: Internal Medicine

## 2015-04-07 ENCOUNTER — Ambulatory Visit (HOSPITAL_BASED_OUTPATIENT_CLINIC_OR_DEPARTMENT_OTHER): Payer: Medicare HMO | Admitting: Internal Medicine

## 2015-04-07 VITALS — BP 145/79 | HR 76 | Temp 98.0°F | Resp 18 | Ht 64.0 in | Wt 188.0 lb

## 2015-04-07 DIAGNOSIS — R599 Enlarged lymph nodes, unspecified: Secondary | ICD-10-CM | POA: Diagnosis not present

## 2015-04-07 DIAGNOSIS — D47Z2 Castleman disease: Secondary | ICD-10-CM

## 2015-04-07 DIAGNOSIS — D693 Immune thrombocytopenic purpura: Secondary | ICD-10-CM | POA: Diagnosis not present

## 2015-04-07 NOTE — Progress Notes (Signed)
[place] [phone]   Fax:(336) [phone]  OFFICE PROGRESS NOTE  Donnajean Lopes, MD [place] [address]  DIAGNOSIS:  1. Castleman's Disease  2. Idiopathic Thrombocytopenic Purpura. 3. 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  2) Weekly rituximab at 375 mg per meter squared with first cycle given in the hospital. Now status post 2 partial cycles and 7 complete cycles.  3) Maintenance Rituxan 375 mg/M2 every 3 months, status post 12 cycles.  CURRENT THERAPY: Observation.  INTERVAL HISTORY: [name] 59 y.o. female returns to the clinic today for followup visit. The patient has been observation recently. She is feeling fine today with no specific complaints. She denied having any significant chest pain, shortness of breath, cough or hemoptysis. She denied having any palpable lymphadenopathy. She has no weight loss or night sweats. She has no fever or chills and no nausea or vomiting. She had repeat CT scan of the chest, abdomen and pelvis performed recently and she is here for evaluation and discussion of her scan results.  MEDICAL HISTORY: Past Medical History  Diagnosis Date  . Hypothyroidism   . Heart attack 2007  . Chronic kidney disease     failure  . Heart attack 2007  . Retroperitoneal lymphadenopathy 2012  . Castleman's disease   . ITP (idiopathic thrombocytopenic purpura) 06/17/2011  . Hypertension     ALLERGIES:  is allergic to cefdinir; levaquin; levofloxacin; and vicodin.  MEDICATIONS:  Current Outpatient Prescriptions  Medication Sig Dispense Refill  . ALPRAZolam (XANAX) 0.25 MG tablet Take 0.25 mg by mouth at bedtime as needed. For sleep    . CRESTOR 10 MG tablet     . ibuprofen (ADVIL,MOTRIN) 200 MG tablet Take 400 mg by mouth every 6 (six) hours as needed for  pain or headache.    . levothyroxine (SYNTHROID, LEVOTHROID) 112 MCG tablet Take 112 mcg by mouth daily before breakfast.     . metoprolol succinate (TOPROL-XL) 25 MG 24 hr tablet Take 50 mg by mouth 2 (two) times daily.     . RiTUXimab (RITUXAN IV) Inject 1 application into the vein every 8 (eight) weeks. Through port in chest  at [facility], [place] last dose was 10/23/12     No current facility-administered medications for this visit.   Facility-Administered Medications Ordered in Other Visits  Medication Dose Route Frequency Provider Last Rate Last Dose  . sodium chloride 0.9 % injection 10 mL  10 mL Intracatheter PRN Curt Bears, MD        SURGICAL HISTORY:  Past Surgical History  Procedure Laterality Date  . Cesarean section  1983 1989  . Appendectomy      59 years old    REVIEW OF SYSTEMS:  Constitutional: negative Eyes: negative Ears, nose, mouth, throat, and face: negative Respiratory: negative Cardiovascular: negative Gastrointestinal: negative Genitourinary:negative Integument/breast: negative Hematologic/lymphatic: negative Musculoskeletal:negative Neurological: negative Behavioral/Psych: negative Endocrine: negative Allergic/Immunologic: negative   PHYSICAL EXAMINATION: General appearance: alert, cooperative and no distress Head: Normocephalic, without obvious abnormality, atraumatic Neck: no adenopathy Lymph nodes: Cervical, supraclavicular, and axillary nodes normal. Resp: clear to auscultation bilaterally Back: symmetric, no curvature. ROM normal. No CVA tenderness. Cardio: regular rate and rhythm, S1, S2 normal, no murmur, click, rub or gallop GI: soft, non-tender; bowel sounds normal; no masses,  no organomegaly Extremities: extremities normal, atraumatic, no  cyanosis or edema Neurologic: Alert and oriented X 3, normal strength and tone. Normal symmetric reflexes. Normal coordination and gait  ECOG PERFORMANCE STATUS: 1 -  Symptomatic but completely ambulatory  Blood pressure 145/79, pulse 76, temperature 98 F (36.7 C), temperature source Oral, resp. rate 18, height 5\' 4"  (1.626 m), weight 188 lb (85.276 kg), SpO2 97 %.  LABORATORY DATA: Lab Results  Component Value Date   WBC 10.4* 03/30/2015   HGB 15.3 03/30/2015   HCT 44.3 03/30/2015   MCV 92.0 03/30/2015   PLT 351 03/30/2015      Chemistry      Component Value Date/Time   NA 141 03/30/2015 1346   NA 138 02/28/2012 0840   NA 143 12/13/2010 0957   K 4.5 03/30/2015 1346   K 4.7 02/28/2012 0840   K 4.3 12/13/2010 0957   CL 106 01/18/2013 0851   CL 107 02/28/2012 0840   CL 99 12/13/2010 0957   CO2 28 03/30/2015 1346   CO2 22 02/28/2012 0840   CO2 26 12/13/2010 0957   BUN 14.5 03/30/2015 1346   BUN 14 02/28/2012 0840   BUN 8 12/13/2010 0957   CREATININE 0.8 03/30/2015 1346   CREATININE 0.61 02/28/2012 0840   CREATININE 1.16* 05/31/2011 1530   CREATININE 0.7 12/13/2010 0957      Component Value Date/Time   CALCIUM 9.9 03/30/2015 1346   CALCIUM 9.4 02/28/2012 0840   CALCIUM 8.9 12/13/2010 0957   ALKPHOS 105 03/30/2015 1346   ALKPHOS 101 02/28/2012 0840   ALKPHOS 94* 12/13/2010 0957   AST 17 03/30/2015 1346   AST 15 02/28/2012 0840   AST 21 12/13/2010 0957   ALT 17 03/30/2015 1346   ALT 11 02/28/2012 0840   ALT 13 12/13/2010 0957   BILITOT 0.31 03/30/2015 1346   BILITOT 0.3 02/28/2012 0840   BILITOT 0.50 12/13/2010 0957       RADIOGRAPHIC STUDIES: Ct Chest W Contrast  03/30/2015   CLINICAL DATA:  Subsequent treatment evaluation for Castleman's disease diagnosed 2012. Chemotherapy complete.  EXAM: CT CHEST, ABDOMEN, AND PELVIS WITH CONTRAST  TECHNIQUE: Multidetector CT imaging of the chest, abdomen and pelvis was performed following the standard protocol during bolus administration of intravenous contrast.  CONTRAST:  156mL OMNIPAQUE IOHEXOL 300 MG/ML  SOLN  COMPARISON:  CT 09/29/2014, 03/25/2014  FINDINGS: CT CHEST FINDINGS   Mediastinum/Nodes: No axillary or supraclavicular lymphadenopathy. No mediastinal hilar adenopathy. No pericardial fluid. Central embolism. Esophagus is normal.  Lungs/Pleura: No suspicious pulmonary nodules. No pleural fluid or airspace disease  CT ABDOMEN AND PELVIS FINDINGS  Hepatobiliary: No focal hepatic lesion. Small gallstones within the gallbladder. No biliary duct dilatation.  Pancreas: Pancreas is normal. No ductal dilatation. No pancreatic inflammation.  Spleen: Normal volume spleen.  Adrenals/urinary tract: Adrenal glands and kidneys are normal. The ureters and bladder normal.  Stomach/Bowel: Stomach, small bowel, appendix, and cecum are normal. The colon and rectosigmoid colon are normal.  Vascular/Lymphatic: Abdominal is normal caliber with intimal calcification.  Several small LEFT periaortic lymph nodes are again demonstrated. The largest measures 11 mm short axis on image 72, series 2 and measuring slightly larger than 9 mm on the 2 most recent comparison CT exams. Adjacent 8 mm lymph node just above the bifurcation (image 76, series 2) compares 6 mm. No iliac lymphadenopathy.  Reproductive: Uterus and ovaries are normal.  Musculoskeletal: No aggressive osseous lesion.  Other: No free fluid.  IMPRESSION: Chest Impression:  1. No evidence of lymphadenopathy in the chest. 2.  No pulmonary parenchymal abnormality. 3.  Abdomen / Pelvis Impression:  1. Small LEFT periaortic lymph nodes measure slightly larger than comparison exams. Recommend Attention on follow-up. 2. No iliac adenopathy.  No splenomegaly.   Electronically Signed   By: Suzy Bouchard M.D.   On: 03/30/2015 16:14   Ct Abdomen Pelvis W Contrast  03/30/2015   CLINICAL DATA:  Subsequent treatment evaluation for Castleman's disease diagnosed 2012. Chemotherapy complete.  EXAM: CT CHEST, ABDOMEN, AND PELVIS WITH CONTRAST  TECHNIQUE: Multidetector CT imaging of the chest, abdomen and pelvis was performed following the standard protocol  during bolus administration of intravenous contrast.  CONTRAST:  16mL OMNIPAQUE IOHEXOL 300 MG/ML  SOLN  COMPARISON:  CT 09/29/2014, 03/25/2014  FINDINGS: CT CHEST FINDINGS  Mediastinum/Nodes: No axillary or supraclavicular lymphadenopathy. No mediastinal hilar adenopathy. No pericardial fluid. Central embolism. Esophagus is normal.  Lungs/Pleura: No suspicious pulmonary nodules. No pleural fluid or airspace disease  CT ABDOMEN AND PELVIS FINDINGS  Hepatobiliary: No focal hepatic lesion. Small gallstones within the gallbladder. No biliary duct dilatation.  Pancreas: Pancreas is normal. No ductal dilatation. No pancreatic inflammation.  Spleen: Normal volume spleen.  Adrenals/urinary tract: Adrenal glands and kidneys are normal. The ureters and bladder normal.  Stomach/Bowel: Stomach, small bowel, appendix, and cecum are normal. The colon and rectosigmoid colon are normal.  Vascular/Lymphatic: Abdominal is normal caliber with intimal calcification.  Several small LEFT periaortic lymph nodes are again demonstrated. The largest measures 11 mm short axis on image 72, series 2 and measuring slightly larger than 9 mm on the 2 most recent comparison CT exams. Adjacent 8 mm lymph node just above the bifurcation (image 76, series 2) compares 6 mm. No iliac lymphadenopathy.  Reproductive: Uterus and ovaries are normal.  Musculoskeletal: No aggressive osseous lesion.  Other: No free fluid.  IMPRESSION: Chest Impression:  1. No evidence of lymphadenopathy in the chest. 2. No pulmonary parenchymal abnormality. 3.  Abdomen / Pelvis Impression:  1. Small LEFT periaortic lymph nodes measure slightly larger than comparison exams. Recommend Attention on follow-up. 2. No iliac adenopathy.  No splenomegaly.   Electronically Signed   By: Suzy Bouchard M.D.   On: 03/30/2015 16:14   ASSESSMENT AND PLAN: This is a very pleasant 59 years old white female with Castleman's disease. She completed 12 cycles of maintenance treatment with  Rituxan every 3 months and tolerated her treatment fairly well with no significant adverse effects. The recent CT scan of the chest, abdomen and pelvis showed no evidence for recurrent disease except for slightly increased size of small left periaortic lymph node. I discussed the scan results with the patient today. I recommended for her to continue on observation with close monitoring and repeat CT scan of the chest, abdomen and pelvis in 6 months. She was advised to call immediately if she has any concerning symptoms in the interval.  All questions were answered. The patient knows to call the clinic with any problems, questions or concerns. We can certainly see the patient much sooner if necessary.  Disclaimer: This note was dictated with voice recognition software. Similar sounding words can inadvertently be transcribed and may not be corrected upon review.

## 2015-04-07 NOTE — Telephone Encounter (Signed)
Gave adn printed appt sched and avs for pt for March 2017....gv barium

## 2015-04-13 ENCOUNTER — Encounter: Payer: Self-pay | Admitting: Medical Oncology

## 2015-07-07 DIAGNOSIS — R05 Cough: Secondary | ICD-10-CM | POA: Diagnosis not present

## 2015-07-07 DIAGNOSIS — Z6830 Body mass index (BMI) 30.0-30.9, adult: Secondary | ICD-10-CM | POA: Diagnosis not present

## 2015-07-07 DIAGNOSIS — Z720 Tobacco use: Secondary | ICD-10-CM | POA: Diagnosis not present

## 2015-07-07 DIAGNOSIS — J019 Acute sinusitis, unspecified: Secondary | ICD-10-CM | POA: Diagnosis not present

## 2015-07-16 DIAGNOSIS — D693 Immune thrombocytopenic purpura: Secondary | ICD-10-CM | POA: Diagnosis not present

## 2015-07-16 DIAGNOSIS — I10 Essential (primary) hypertension: Secondary | ICD-10-CM | POA: Diagnosis not present

## 2015-07-16 DIAGNOSIS — I252 Old myocardial infarction: Secondary | ICD-10-CM | POA: Diagnosis not present

## 2015-07-16 DIAGNOSIS — R69 Illness, unspecified: Secondary | ICD-10-CM | POA: Diagnosis not present

## 2015-07-16 DIAGNOSIS — E039 Hypothyroidism, unspecified: Secondary | ICD-10-CM | POA: Diagnosis not present

## 2015-07-16 DIAGNOSIS — E669 Obesity, unspecified: Secondary | ICD-10-CM | POA: Diagnosis not present

## 2015-07-16 DIAGNOSIS — I2510 Atherosclerotic heart disease of native coronary artery without angina pectoris: Secondary | ICD-10-CM | POA: Diagnosis not present

## 2015-07-16 DIAGNOSIS — E785 Hyperlipidemia, unspecified: Secondary | ICD-10-CM | POA: Diagnosis not present

## 2015-10-05 ENCOUNTER — Other Ambulatory Visit: Payer: Medicare HMO

## 2015-10-06 ENCOUNTER — Other Ambulatory Visit (HOSPITAL_BASED_OUTPATIENT_CLINIC_OR_DEPARTMENT_OTHER): Payer: Medicare HMO

## 2015-10-06 ENCOUNTER — Other Ambulatory Visit: Payer: Self-pay | Admitting: *Deleted

## 2015-10-06 ENCOUNTER — Ambulatory Visit (HOSPITAL_COMMUNITY)
Admission: RE | Admit: 2015-10-06 | Discharge: 2015-10-06 | Disposition: A | Discharge status: Home / Self Care | Payer: Medicare HMO | Source: Ambulatory Visit | Attending: Internal Medicine | Admitting: Internal Medicine

## 2015-10-06 ENCOUNTER — Encounter (HOSPITAL_COMMUNITY): Payer: Self-pay

## 2015-10-06 DIAGNOSIS — R591 Generalized enlarged lymph nodes: Secondary | ICD-10-CM

## 2015-10-06 DIAGNOSIS — D47Z2 Castleman disease: Secondary | ICD-10-CM

## 2015-10-06 DIAGNOSIS — R599 Enlarged lymph nodes, unspecified: Secondary | ICD-10-CM

## 2015-10-06 DIAGNOSIS — R160 Hepatomegaly, not elsewhere classified: Secondary | ICD-10-CM | POA: Diagnosis not present

## 2015-10-06 DIAGNOSIS — I2510 Atherosclerotic heart disease of native coronary artery without angina pectoris: Secondary | ICD-10-CM | POA: Diagnosis not present

## 2015-10-06 DIAGNOSIS — J432 Centrilobular emphysema: Secondary | ICD-10-CM | POA: Insufficient documentation

## 2015-10-06 DIAGNOSIS — R590 Localized enlarged lymph nodes: Secondary | ICD-10-CM | POA: Insufficient documentation

## 2015-10-06 DIAGNOSIS — K8020 Calculus of gallbladder without cholecystitis without obstruction: Secondary | ICD-10-CM | POA: Diagnosis not present

## 2015-10-06 LAB — COMPREHENSIVE METABOLIC PANEL
ALT: 14 U/L (ref 0–55)
AST: 14 U/L (ref 5–34)
Albumin: 4.1 g/dL (ref 3.5–5.0)
Alkaline Phosphatase: 114 U/L (ref 40–150)
Anion Gap: 9 mEq/L (ref 3–11)
BUN: 10.2 mg/dL (ref 7.0–26.0)
CO2: 29 meq/L (ref 22–29)
Calcium: 9.4 mg/dL (ref 8.4–10.4)
Chloride: 103 mEq/L (ref 98–109)
Creatinine: 0.8 mg/dL (ref 0.6–1.1)
EGFR: 80 mL/min/{1.73_m2} — AB (ref >90)
Glucose: 84 mg/dl (ref 70–140)
Potassium: 4.6 mEq/L (ref 3.5–5.1)
SODIUM: 141 meq/L (ref 136–145)
TOTAL PROTEIN: 7.3 g/dL (ref 6.4–8.3)
Total Bilirubin: 0.27 mg/dL (ref 0.20–1.20)

## 2015-10-06 LAB — LACTATE DEHYDROGENASE: LDH: 131 U/L (ref 125–245)

## 2015-10-06 MED ORDER — IOHEXOL 300 MG/ML  SOLN
100.0000 mL | Freq: Once | INTRAMUSCULAR | Status: AC | PRN
  Administered 2015-10-06: 100 mL via INTRAVENOUS

## 2015-10-09 ENCOUNTER — Telehealth: Payer: Self-pay | Admitting: Internal Medicine

## 2015-10-09 NOTE — Telephone Encounter (Signed)
Faxed pt medical records to [facility] [phone] °

## 2015-10-13 ENCOUNTER — Ambulatory Visit (HOSPITAL_BASED_OUTPATIENT_CLINIC_OR_DEPARTMENT_OTHER): Payer: Medicare HMO

## 2015-10-13 ENCOUNTER — Encounter: Payer: Self-pay | Admitting: Internal Medicine

## 2015-10-13 ENCOUNTER — Ambulatory Visit (HOSPITAL_BASED_OUTPATIENT_CLINIC_OR_DEPARTMENT_OTHER): Payer: Medicare HMO | Admitting: Internal Medicine

## 2015-10-13 ENCOUNTER — Telehealth: Payer: Self-pay | Admitting: Internal Medicine

## 2015-10-13 VITALS — BP 134/71 | HR 64 | Temp 97.8°F | Resp 18 | Ht 64.0 in | Wt 184.5 lb

## 2015-10-13 DIAGNOSIS — D693 Immune thrombocytopenic purpura: Secondary | ICD-10-CM

## 2015-10-13 DIAGNOSIS — R599 Enlarged lymph nodes, unspecified: Secondary | ICD-10-CM

## 2015-10-13 DIAGNOSIS — R591 Generalized enlarged lymph nodes: Secondary | ICD-10-CM | POA: Diagnosis not present

## 2015-10-13 DIAGNOSIS — D47Z2 Castleman disease: Secondary | ICD-10-CM

## 2015-10-13 LAB — CBC WITH DIFFERENTIAL/PLATELET
BASO%: 0.3 % (ref 0.0–2.0)
Basophils Absolute: 0 10*3/uL (ref 0.0–0.1)
EOS ABS: 0.3 10*3/uL (ref 0.0–0.5)
EOS%: 2.7 % (ref 0.0–7.0)
HEMATOCRIT: 42.5 % (ref 34.8–46.6)
HEMOGLOBIN: 13.9 g/dL (ref 11.6–15.9)
LYMPH#: 2.4 10*3/uL (ref 0.9–3.3)
LYMPH%: 24.5 % (ref 14.0–49.7)
MCH: 30.3 pg (ref 25.1–34.0)
MCHC: 32.7 g/dL (ref 31.5–36.0)
MCV: 92.7 fL (ref 79.5–101.0)
MONO#: 0.8 10*3/uL (ref 0.1–0.9)
MONO%: 7.7 % (ref 0.0–14.0)
NEUT%: 64.8 % (ref 38.4–76.8)
NEUTROS ABS: 6.4 10*3/uL (ref 1.5–6.5)
PLATELETS: 302 10*3/uL (ref 145–400)
RBC: 4.58 10*6/uL (ref 3.70–5.45)
RDW: 13.7 % (ref 11.2–14.5)
WBC: 9.8 10*3/uL (ref 3.9–10.3)

## 2015-10-13 NOTE — Telephone Encounter (Signed)
Gave and printed appt sched and avs for pt for Sept °

## 2015-10-13 NOTE — Progress Notes (Signed)
[place] Telephone:([phone]   Fax:(336) [phone]  OFFICE PROGRESS NOTE  Donnajean Lopes, MD [place] [address]  DIAGNOSIS:  1. Castleman's Disease  2. Idiopathic Thrombocytopenic Purpura. 3. 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  2) Weekly rituximab at 375 mg per meter squared with first cycle given in the hospital. Now status post 2 partial cycles and 7 complete cycles.  3) Maintenance Rituxan 375 mg/M2 every 3 months, status post 12 cycles.  CURRENT THERAPY: Observation.  INTERVAL HISTORY: [name] 60 y.o. female returns to the clinic today for followup visit. The patient has been observation and has no complaints. She denied having any significant chest pain, shortness of breath, cough or hemoptysis. She denied having any palpable lymphadenopathy. She has no weight loss or night sweats. She has no fever or chills and no nausea or vomiting. She had repeat CT scan of the chest, abdomen and pelvis performed recently and she is here for evaluation and discussion of her scan results.  MEDICAL HISTORY: Past Medical History  Diagnosis Date  . Hypothyroidism   . Heart attack ([place]) 2007  . Chronic kidney disease     failure  . Heart attack ([place]) 2007  . Retroperitoneal lymphadenopathy 2012  . Castleman's disease   . ITP (idiopathic thrombocytopenic purpura) 06/17/2011  . Hypertension     ALLERGIES:  is allergic to cefdinir; levaquin; levofloxacin; and vicodin.  MEDICATIONS:  Current Outpatient Prescriptions  Medication Sig Dispense Refill  . ALPRAZolam (XANAX) 0.25 MG tablet Take 0.25 mg by mouth at bedtime as needed. For sleep    . CRESTOR 10 MG tablet     . ibuprofen (ADVIL,MOTRIN) 200 MG tablet Take 400 mg by mouth every 6 (six) hours as needed for pain or headache.    .  levothyroxine (SYNTHROID, LEVOTHROID) 112 MCG tablet Take 112 mcg by mouth daily before breakfast.     . metoprolol succinate (TOPROL-XL) 25 MG 24 hr tablet Take 50 mg by mouth 2 (two) times daily.     . RiTUXimab (RITUXAN IV) Inject 1 application into the vein every 8 (eight) weeks. Through port in chest  at [facility], [place] last dose was 10/23/12     No current facility-administered medications for this visit.   Facility-Administered Medications Ordered in Other Visits  Medication Dose Route Frequency Provider Last Rate Last Dose  . sodium chloride 0.9 % injection 10 mL  10 mL Intracatheter PRN Curt Bears, MD        SURGICAL HISTORY:  Past Surgical History  Procedure Laterality Date  . Cesarean section  1983 1989  . Appendectomy      60 years old    REVIEW OF SYSTEMS:  A comprehensive review of systems was negative.   PHYSICAL EXAMINATION: General appearance: alert, cooperative and no distress Head: Normocephalic, without obvious abnormality, atraumatic Neck: no adenopathy Lymph nodes: Cervical, supraclavicular, and axillary nodes normal. Resp: clear to auscultation bilaterally Back: symmetric, no curvature. ROM normal. No CVA tenderness. Cardio: regular rate and rhythm, S1, S2 normal, no murmur, click, rub or gallop GI: soft, non-tender; bowel sounds normal; no masses,  no organomegaly Extremities: extremities normal, atraumatic, no cyanosis or edema Neurologic: Alert and oriented X 3, normal strength and tone. Normal symmetric reflexes. Normal coordination and gait  ECOG PERFORMANCE STATUS: 1 - Symptomatic but  completely ambulatory  Blood pressure 134/71, pulse 64, temperature 97.8 F (36.6 C), temperature source Oral, resp. rate 18, height 5\' 4"  (1.626 m), weight 184 lb 8 oz (83.689 kg), SpO2 98 %.  LABORATORY DATA: Lab Results  Component Value Date   WBC 10.4* 03/30/2015   HGB 15.3 03/30/2015   HCT 44.3 03/30/2015   MCV 92.0 03/30/2015    PLT 351 03/30/2015      Chemistry      Component Value Date/Time   NA 141 10/06/2015 1300   NA 138 02/28/2012 0840   NA 143 12/13/2010 0957   K 4.6 10/06/2015 1300   K 4.7 02/28/2012 0840   K 4.3 12/13/2010 0957   CL 106 01/18/2013 0851   CL 107 02/28/2012 0840   CL 99 12/13/2010 0957   CO2 29 10/06/2015 1300   CO2 22 02/28/2012 0840   CO2 26 12/13/2010 0957   BUN 10.2 10/06/2015 1300   BUN 14 02/28/2012 0840   BUN 8 12/13/2010 0957   CREATININE 0.8 10/06/2015 1300   CREATININE 0.61 02/28/2012 0840   CREATININE 1.16* 05/31/2011 1530   CREATININE 0.7 12/13/2010 0957      Component Value Date/Time   CALCIUM 9.4 10/06/2015 1300   CALCIUM 9.4 02/28/2012 0840   CALCIUM 8.9 12/13/2010 0957   ALKPHOS 114 10/06/2015 1300   ALKPHOS 101 02/28/2012 0840   ALKPHOS 94* 12/13/2010 0957   AST 14 10/06/2015 1300   AST 15 02/28/2012 0840   AST 21 12/13/2010 0957   ALT 14 10/06/2015 1300   ALT 11 02/28/2012 0840   ALT 13 12/13/2010 0957   BILITOT 0.27 10/06/2015 1300   BILITOT 0.3 02/28/2012 0840   BILITOT 0.50 12/13/2010 0957       RADIOGRAPHIC STUDIES: Ct Chest W Contrast  10/06/2015  CLINICAL DATA:  Castleman's disease diagnosed 6/12. Chronic cough. Short of breath. Chemotherapy and radiation therapy completed in 2015. Staging. EXAM: CT CHEST, ABDOMEN, AND PELVIS WITH CONTRAST TECHNIQUE: Multidetector CT imaging of the chest, abdomen and pelvis was performed following the standard protocol during bolus administration of intravenous contrast. CONTRAST:  112mL OMNIPAQUE IOHEXOL 300 MG/ML  SOLN COMPARISON:  03/30/2015 FINDINGS: CT CHEST FINDINGS Mediastinum/Lymph Nodes: No supraclavicular adenopathy. Right subpectoral node measures 8 mm on image 13/series 2 versus 7 mm on the prior. Left-sided subpectoral nodes measure maximally 9 mm on image 11/series 2 versus 7 mm on the prior. Aortic atherosclerosis. Normal heart size, without pericardial effusion. Right coronary artery  atherosclerosis versus a coronary stent. No central pulmonary embolism, on this non-dedicated study. Lungs/Pleura: No pleural fluid. Mild centrilobular emphysema. Minimal left apical subpleural nodularity is likely related to a subpleural lymph node. Similar. Musculoskeletal: No acute osseous abnormality. CT ABDOMEN PELVIS FINDINGS Hepatobiliary: Hepatomegaly, 19.9 cm craniocaudal. Minimal motion degradation in the upper to mid abdomen. Small gallstones without acute cholecystitis or biliary duct dilatation. Pancreas: Normal, without mass or ductal dilatation. Spleen: Normal in size, without focal abnormality. Adrenals/Urinary Tract: Normal adrenal glands. Normal kidneys, without hydronephrosis. Normal urinary bladder. Stomach/Bowel: Proximal gastric underdistention. Normal colon and terminal ileum. Normal small bowel. Vascular/Lymphatic: Aortic and branch vessel atherosclerosis. Left periaortic retroperitoneal node measures 11 x 13 mm on image 74/series 2, similar. Index preaortic node measures 7 mm on image 69/series 2 and is also not significantly changed. No pelvic sidewall adenopathy. Reproductive: Retroverted uterus.  No adnexal mass. Other: No significant free fluid. Musculoskeletal: No acute osseous abnormality. Degenerative disc disease at the lumbosacral junction. IMPRESSION: 1. Similar retroperitoneal abdominal adenopathy.  2. Similar to slight increase in size of subpectoral nodes, which are borderline to mildly enlarged by size criteria. 3. Similar hepatomegaly. 4. Cholelithiasis. 5. Age advanced coronary artery atherosclerosis. Recommend assessment of coronary risk factors and consideration of medical therapy. 6. Centrilobular emphysema. Electronically Signed   By: Abigail Miyamoto M.D.   On: 10/06/2015 15:28   Ct Abdomen Pelvis W Contrast  10/06/2015  CLINICAL DATA:  Castleman's disease diagnosed 6/12. Chronic cough. Short of breath. Chemotherapy and radiation therapy completed in 2015. Staging. EXAM: CT  CHEST, ABDOMEN, AND PELVIS WITH CONTRAST TECHNIQUE: Multidetector CT imaging of the chest, abdomen and pelvis was performed following the standard protocol during bolus administration of intravenous contrast. CONTRAST:  18mL OMNIPAQUE IOHEXOL 300 MG/ML  SOLN COMPARISON:  03/30/2015 FINDINGS: CT CHEST FINDINGS Mediastinum/Lymph Nodes: No supraclavicular adenopathy. Right subpectoral node measures 8 mm on image 13/series 2 versus 7 mm on the prior. Left-sided subpectoral nodes measure maximally 9 mm on image 11/series 2 versus 7 mm on the prior. Aortic atherosclerosis. Normal heart size, without pericardial effusion. Right coronary artery atherosclerosis versus a coronary stent. No central pulmonary embolism, on this non-dedicated study. Lungs/Pleura: No pleural fluid. Mild centrilobular emphysema. Minimal left apical subpleural nodularity is likely related to a subpleural lymph node. Similar. Musculoskeletal: No acute osseous abnormality. CT ABDOMEN PELVIS FINDINGS Hepatobiliary: Hepatomegaly, 19.9 cm craniocaudal. Minimal motion degradation in the upper to mid abdomen. Small gallstones without acute cholecystitis or biliary duct dilatation. Pancreas: Normal, without mass or ductal dilatation. Spleen: Normal in size, without focal abnormality. Adrenals/Urinary Tract: Normal adrenal glands. Normal kidneys, without hydronephrosis. Normal urinary bladder. Stomach/Bowel: Proximal gastric underdistention. Normal colon and terminal ileum. Normal small bowel. Vascular/Lymphatic: Aortic and branch vessel atherosclerosis. Left periaortic retroperitoneal node measures 11 x 13 mm on image 74/series 2, similar. Index preaortic node measures 7 mm on image 69/series 2 and is also not significantly changed. No pelvic sidewall adenopathy. Reproductive: Retroverted uterus.  No adnexal mass. Other: No significant free fluid. Musculoskeletal: No acute osseous abnormality. Degenerative disc disease at the lumbosacral junction.  IMPRESSION: 1. Similar retroperitoneal abdominal adenopathy. 2. Similar to slight increase in size of subpectoral nodes, which are borderline to mildly enlarged by size criteria. 3. Similar hepatomegaly. 4. Cholelithiasis. 5. Age advanced coronary artery atherosclerosis. Recommend assessment of coronary risk factors and consideration of medical therapy. 6. Centrilobular emphysema. Electronically Signed   By: Abigail Miyamoto M.D.   On: 10/06/2015 15:28   ASSESSMENT AND PLAN: This is a very pleasant 60 years old white female with Castleman's disease. She completed 12 cycles of maintenance treatment with Rituxan every 3 months and tolerated her treatment fairly well with no significant adverse effects. Her recent CT scan of the chest, abdomen and pelvis on 10/06/2015 showed no concerning findings for disease progression. I discussed the scan results with the patient today. I recommended for her to continue on observation with close monitoring and repeat blood work in 6 months. She was advised to call immediately if she has any concerning symptoms in the interval.  All questions were answered. The patient knows to call the clinic with any problems, questions or concerns. We can certainly see the patient much sooner if necessary.  Disclaimer: This note was dictated with voice recognition software. Similar sounding words can inadvertently be transcribed and may not be corrected upon review.

## 2015-11-09 DIAGNOSIS — Z86010 Personal history of colonic polyps: Secondary | ICD-10-CM | POA: Diagnosis not present

## 2015-11-09 DIAGNOSIS — I10 Essential (primary) hypertension: Secondary | ICD-10-CM | POA: Diagnosis not present

## 2015-11-09 DIAGNOSIS — D47Z2 Castleman disease: Secondary | ICD-10-CM | POA: Diagnosis not present

## 2015-11-09 DIAGNOSIS — K625 Hemorrhage of anus and rectum: Secondary | ICD-10-CM | POA: Diagnosis not present

## 2015-11-23 DIAGNOSIS — E669 Obesity, unspecified: Secondary | ICD-10-CM | POA: Diagnosis not present

## 2015-11-23 DIAGNOSIS — E785 Hyperlipidemia, unspecified: Secondary | ICD-10-CM | POA: Diagnosis not present

## 2015-11-23 DIAGNOSIS — I2510 Atherosclerotic heart disease of native coronary artery without angina pectoris: Secondary | ICD-10-CM | POA: Diagnosis not present

## 2015-11-23 DIAGNOSIS — I10 Essential (primary) hypertension: Secondary | ICD-10-CM | POA: Diagnosis not present

## 2015-11-23 DIAGNOSIS — D693 Immune thrombocytopenic purpura: Secondary | ICD-10-CM | POA: Diagnosis not present

## 2015-11-23 DIAGNOSIS — E039 Hypothyroidism, unspecified: Secondary | ICD-10-CM | POA: Diagnosis not present

## 2015-11-23 DIAGNOSIS — R69 Illness, unspecified: Secondary | ICD-10-CM | POA: Diagnosis not present

## 2015-11-23 DIAGNOSIS — I252 Old myocardial infarction: Secondary | ICD-10-CM | POA: Diagnosis not present

## 2015-11-24 DIAGNOSIS — D124 Benign neoplasm of descending colon: Secondary | ICD-10-CM | POA: Diagnosis not present

## 2015-11-24 DIAGNOSIS — K635 Polyp of colon: Secondary | ICD-10-CM | POA: Diagnosis not present

## 2015-11-24 DIAGNOSIS — K5730 Diverticulosis of large intestine without perforation or abscess without bleeding: Secondary | ICD-10-CM | POA: Diagnosis not present

## 2015-11-24 DIAGNOSIS — Z1211 Encounter for screening for malignant neoplasm of colon: Secondary | ICD-10-CM | POA: Diagnosis not present

## 2015-11-24 DIAGNOSIS — D122 Benign neoplasm of ascending colon: Secondary | ICD-10-CM | POA: Diagnosis not present

## 2016-03-15 DIAGNOSIS — E784 Other hyperlipidemia: Secondary | ICD-10-CM | POA: Diagnosis not present

## 2016-03-15 DIAGNOSIS — E038 Other specified hypothyroidism: Secondary | ICD-10-CM | POA: Diagnosis not present

## 2016-03-24 DIAGNOSIS — D693 Immune thrombocytopenic purpura: Secondary | ICD-10-CM | POA: Diagnosis not present

## 2016-03-24 DIAGNOSIS — F17290 Nicotine dependence, other tobacco product, uncomplicated: Secondary | ICD-10-CM | POA: Diagnosis not present

## 2016-03-24 DIAGNOSIS — I2510 Atherosclerotic heart disease of native coronary artery without angina pectoris: Secondary | ICD-10-CM | POA: Diagnosis not present

## 2016-03-24 DIAGNOSIS — E039 Hypothyroidism, unspecified: Secondary | ICD-10-CM | POA: Diagnosis not present

## 2016-03-24 DIAGNOSIS — I10 Essential (primary) hypertension: Secondary | ICD-10-CM | POA: Diagnosis not present

## 2016-03-24 DIAGNOSIS — I252 Old myocardial infarction: Secondary | ICD-10-CM | POA: Diagnosis not present

## 2016-03-24 DIAGNOSIS — E669 Obesity, unspecified: Secondary | ICD-10-CM | POA: Diagnosis not present

## 2016-03-24 DIAGNOSIS — R69 Illness, unspecified: Secondary | ICD-10-CM | POA: Diagnosis not present

## 2016-04-12 ENCOUNTER — Encounter: Payer: Self-pay | Admitting: Internal Medicine

## 2016-04-12 ENCOUNTER — Ambulatory Visit (HOSPITAL_BASED_OUTPATIENT_CLINIC_OR_DEPARTMENT_OTHER): Payer: Medicare HMO | Admitting: Internal Medicine

## 2016-04-12 ENCOUNTER — Telehealth: Payer: Self-pay | Admitting: Internal Medicine

## 2016-04-12 ENCOUNTER — Other Ambulatory Visit (HOSPITAL_BASED_OUTPATIENT_CLINIC_OR_DEPARTMENT_OTHER): Payer: Medicare HMO

## 2016-04-12 VITALS — BP 120/75 | HR 64 | Temp 97.9°F | Resp 17 | Ht 64.0 in | Wt 179.1 lb

## 2016-04-12 DIAGNOSIS — D47Z2 Castleman disease: Secondary | ICD-10-CM | POA: Diagnosis not present

## 2016-04-12 DIAGNOSIS — D693 Immune thrombocytopenic purpura: Secondary | ICD-10-CM | POA: Diagnosis not present

## 2016-04-12 DIAGNOSIS — R599 Enlarged lymph nodes, unspecified: Secondary | ICD-10-CM

## 2016-04-12 LAB — CBC WITH DIFFERENTIAL/PLATELET
BASO%: 0.8 % (ref 0.0–2.0)
BASOS ABS: 0.1 10*3/uL (ref 0.0–0.1)
EOS ABS: 0.3 10*3/uL (ref 0.0–0.5)
EOS%: 2.8 % (ref 0.0–7.0)
HEMATOCRIT: 44.9 % (ref 34.8–46.6)
HGB: 15.1 g/dL (ref 11.6–15.9)
LYMPH#: 2.2 10*3/uL (ref 0.9–3.3)
LYMPH%: 21.8 % (ref 14.0–49.7)
MCH: 31.4 pg (ref 25.1–34.0)
MCHC: 33.6 g/dL (ref 31.5–36.0)
MCV: 93.4 fL (ref 79.5–101.0)
MONO#: 0.7 10*3/uL (ref 0.1–0.9)
MONO%: 6.9 % (ref 0.0–14.0)
NEUT#: 6.8 10*3/uL — ABNORMAL HIGH (ref 1.5–6.5)
NEUT%: 67.7 % (ref 38.4–76.8)
PLATELETS: 337 10*3/uL (ref 145–400)
RBC: 4.81 10*6/uL (ref 3.70–5.45)
RDW: 13.5 % (ref 11.2–14.5)
WBC: 10 10*3/uL (ref 3.9–10.3)

## 2016-04-12 LAB — COMPREHENSIVE METABOLIC PANEL
ALK PHOS: 107 U/L (ref 40–150)
ALT: 13 U/L (ref 0–55)
ANION GAP: 8 meq/L (ref 3–11)
AST: 13 U/L (ref 5–34)
Albumin: 4 g/dL (ref 3.5–5.0)
BILIRUBIN TOTAL: 0.32 mg/dL (ref 0.20–1.20)
BUN: 11.9 mg/dL (ref 7.0–26.0)
CALCIUM: 9.8 mg/dL (ref 8.4–10.4)
CHLORIDE: 105 meq/L (ref 98–109)
CO2: 27 meq/L (ref 22–29)
CREATININE: 0.8 mg/dL (ref 0.6–1.1)
EGFR: 80 mL/min/{1.73_m2} — AB (ref >90)
Glucose: 104 mg/dl (ref 70–140)
Potassium: 4.4 mEq/L (ref 3.5–5.1)
Sodium: 141 mEq/L (ref 136–145)
Total Protein: 7.1 g/dL (ref 6.4–8.3)

## 2016-04-12 LAB — LACTATE DEHYDROGENASE: LDH: 135 U/L (ref 125–245)

## 2016-04-12 NOTE — Progress Notes (Signed)
[facility] [phone]   Fax:(336) [phone]  OFFICE PROGRESS NOTE  Donnajean Lopes, MD [place] [address]  DIAGNOSIS:  1. Castleman's Disease  2. Idiopathic Thrombocytopenic Purpura. 3. 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  2) Weekly rituximab at 375 mg per meter squared with first cycle given in the hospital. Now status post 2 partial cycles and 7 complete cycles.  3) Maintenance Rituxan 375 mg/M2 every 3 months, status post 12 cycles.  CURRENT THERAPY: Observation.  INTERVAL HISTORY: [name] 60 y.o. female returns to the clinic today for followup visit. The patient has been observation and has no complaints. She denied having any significant chest pain, shortness of breath, cough or hemoptysis. She denied having any palpable lymphadenopathy. She has no weight loss or night sweats. She has no fever or chills and no nausea or vomiting. She had repeat bloodwork performed recently and she is here for evaluation and discussion of her lab results.  MEDICAL HISTORY: Past Medical History:  Diagnosis Date  . Castleman's disease   . Chronic kidney disease    failure  . Heart attack ([place]) 2007  . Heart attack ([place]) 2007  . Hypertension   . Hypothyroidism   . ITP (idiopathic thrombocytopenic purpura) 06/17/2011  . Retroperitoneal lymphadenopathy 2012    ALLERGIES:  is allergic to cefdinir; levaquin [levofloxacin in d5w]; levofloxacin; and vicodin [hydrocodone-acetaminophen].  MEDICATIONS:  Current Outpatient Prescriptions  Medication Sig Dispense Refill  . ALPRAZolam (XANAX) 0.25 MG tablet Take 0.25 mg by mouth at bedtime as needed. For sleep    . CRESTOR 10 MG tablet     . ibuprofen (ADVIL,MOTRIN) 200 MG tablet Take 400 mg by mouth every 6 (six) hours as needed for pain or  headache.    . levothyroxine (SYNTHROID, LEVOTHROID) 112 MCG tablet Take 112 mcg by mouth daily before breakfast.     . metoprolol succinate (TOPROL-XL) 25 MG 24 hr tablet Take 50 mg by mouth 2 (two) times daily.     . RiTUXimab (RITUXAN IV) Inject 1 application into the vein every 8 (eight) weeks. Through port in chest  at [facility], [place] last dose was 10/23/12     No current facility-administered medications for this visit.    Facility-Administered Medications Ordered in Other Visits  Medication Dose Route Frequency Provider Last Rate Last Dose  . sodium chloride 0.9 % injection 10 mL  10 mL Intracatheter PRN Curt Bears, MD        SURGICAL HISTORY:  Past Surgical History:  Procedure Laterality Date  . APPENDECTOMY     60 years old  . CESAREAN SECTION  1983 1989    REVIEW OF SYSTEMS:  A comprehensive review of systems was negative.   PHYSICAL EXAMINATION: General appearance: alert, cooperative and no distress Head: Normocephalic, without obvious abnormality, atraumatic Neck: no adenopathy Lymph nodes: Cervical, supraclavicular, and axillary nodes normal. Resp: clear to auscultation bilaterally Back: symmetric, no curvature. ROM normal. No CVA tenderness. Cardio: regular rate and rhythm, S1, S2 normal, no murmur, click, rub or gallop GI: soft, non-tender; bowel sounds normal; no masses,  no organomegaly Extremities: extremities normal, atraumatic, no cyanosis or edema Neurologic: Alert and oriented X 3, normal strength and tone. Normal symmetric reflexes. Normal coordination and gait  ECOG PERFORMANCE STATUS: 1 - Symptomatic but completely ambulatory  Blood  pressure 120/75, pulse 64, temperature 97.9 F (36.6 C), temperature source Oral, resp. rate 17, height 5\' 4"  (1.626 m), weight 179 lb 1.6 oz (81.2 kg), SpO2 96 %.  LABORATORY DATA: Lab Results  Component Value Date   WBC 10.0 04/12/2016   HGB 15.1 04/12/2016   HCT 44.9 04/12/2016   MCV 93.4  04/12/2016   PLT 337 04/12/2016      Chemistry      Component Value Date/Time   NA 141 04/12/2016 0948   K 4.4 04/12/2016 0948   CL 106 01/18/2013 0851   CO2 27 04/12/2016 0948   BUN 11.9 04/12/2016 0948   CREATININE 0.8 04/12/2016 0948      Component Value Date/Time   CALCIUM 9.8 04/12/2016 0948   ALKPHOS 107 04/12/2016 0948   AST 13 04/12/2016 0948   ALT 13 04/12/2016 0948   BILITOT 0.32 04/12/2016 0948       RADIOGRAPHIC STUDIES: No results found. ASSESSMENT AND PLAN: This is a very pleasant 60 years old white female with Castleman's disease. She completed 12 cycles of maintenance treatment with Rituxan every 3 months and tolerated her treatment fairly well with no significant adverse effects. The recent blood work including CBC, compliance metabolic panel and LDH were unremarkable. I discussed the lab result with the patient today and I recommended for her to continue on observation with close monitoring and repeat blood work and CT scan of the chest, abdomen and pelvis in 6 months. She was advised to call immediately if she has any concerning symptoms in the interval.  All questions were answered. The patient knows to call the clinic with any problems, questions or concerns. We can certainly see the patient much sooner if necessary.  Disclaimer: This note was dictated with voice recognition software. Similar sounding words can inadvertently be transcribed and may not be corrected upon review.

## 2016-04-12 NOTE — Telephone Encounter (Signed)
Avs report and schedule given per 04/12/16 los. °

## 2016-04-20 ENCOUNTER — Telehealth: Payer: Self-pay | Admitting: Medical Oncology

## 2016-04-20 ENCOUNTER — Encounter: Payer: Self-pay | Admitting: Medical Oncology

## 2016-04-20 NOTE — Telephone Encounter (Signed)
I left message that letter is ready and to call back and let someone know if I should mail it or leave it upfront for pick up.

## 2016-04-25 DIAGNOSIS — E038 Other specified hypothyroidism: Secondary | ICD-10-CM | POA: Diagnosis not present

## 2016-04-25 DIAGNOSIS — I10 Essential (primary) hypertension: Secondary | ICD-10-CM | POA: Diagnosis not present

## 2016-04-25 DIAGNOSIS — Z23 Encounter for immunization: Secondary | ICD-10-CM | POA: Diagnosis not present

## 2016-04-25 DIAGNOSIS — D693 Immune thrombocytopenic purpura: Secondary | ICD-10-CM | POA: Diagnosis not present

## 2016-04-25 DIAGNOSIS — E784 Other hyperlipidemia: Secondary | ICD-10-CM | POA: Diagnosis not present

## 2016-04-25 DIAGNOSIS — Z0000 Encounter for general adult medical examination without abnormal findings: Secondary | ICD-10-CM | POA: Diagnosis not present

## 2016-04-25 DIAGNOSIS — Z720 Tobacco use: Secondary | ICD-10-CM | POA: Diagnosis not present

## 2016-04-25 DIAGNOSIS — Z1389 Encounter for screening for other disorder: Secondary | ICD-10-CM | POA: Diagnosis not present

## 2016-04-25 DIAGNOSIS — G5601 Carpal tunnel syndrome, right upper limb: Secondary | ICD-10-CM | POA: Diagnosis not present

## 2016-04-25 DIAGNOSIS — I2510 Atherosclerotic heart disease of native coronary artery without angina pectoris: Secondary | ICD-10-CM | POA: Diagnosis not present

## 2016-04-26 NOTE — Progress Notes (Signed)
Pt requesting additional copy of letter for insurance. Pt to pick up letter 04/27/2016

## 2016-05-20 DIAGNOSIS — G5602 Carpal tunnel syndrome, left upper limb: Secondary | ICD-10-CM | POA: Diagnosis not present

## 2016-05-20 DIAGNOSIS — G5601 Carpal tunnel syndrome, right upper limb: Secondary | ICD-10-CM | POA: Diagnosis not present

## 2016-06-08 DIAGNOSIS — E038 Other specified hypothyroidism: Secondary | ICD-10-CM | POA: Diagnosis not present

## 2016-06-13 DIAGNOSIS — G5603 Carpal tunnel syndrome, bilateral upper limbs: Secondary | ICD-10-CM | POA: Diagnosis not present

## 2016-06-17 DIAGNOSIS — G5603 Carpal tunnel syndrome, bilateral upper limbs: Secondary | ICD-10-CM | POA: Diagnosis not present

## 2016-06-17 DIAGNOSIS — G5601 Carpal tunnel syndrome, right upper limb: Secondary | ICD-10-CM | POA: Diagnosis not present

## 2016-06-17 DIAGNOSIS — G5602 Carpal tunnel syndrome, left upper limb: Secondary | ICD-10-CM | POA: Diagnosis not present

## 2016-07-07 DIAGNOSIS — R69 Illness, unspecified: Secondary | ICD-10-CM | POA: Diagnosis not present

## 2016-07-07 DIAGNOSIS — D693 Immune thrombocytopenic purpura: Secondary | ICD-10-CM | POA: Diagnosis not present

## 2016-07-07 DIAGNOSIS — I2510 Atherosclerotic heart disease of native coronary artery without angina pectoris: Secondary | ICD-10-CM | POA: Diagnosis not present

## 2016-07-07 DIAGNOSIS — E669 Obesity, unspecified: Secondary | ICD-10-CM | POA: Diagnosis not present

## 2016-07-07 DIAGNOSIS — I10 Essential (primary) hypertension: Secondary | ICD-10-CM | POA: Diagnosis not present

## 2016-07-07 DIAGNOSIS — I252 Old myocardial infarction: Secondary | ICD-10-CM | POA: Diagnosis not present

## 2016-07-07 DIAGNOSIS — E039 Hypothyroidism, unspecified: Secondary | ICD-10-CM | POA: Diagnosis not present

## 2016-07-07 DIAGNOSIS — E785 Hyperlipidemia, unspecified: Secondary | ICD-10-CM | POA: Diagnosis not present

## 2016-07-15 DIAGNOSIS — G5603 Carpal tunnel syndrome, bilateral upper limbs: Secondary | ICD-10-CM | POA: Diagnosis not present

## 2016-07-15 DIAGNOSIS — G5602 Carpal tunnel syndrome, left upper limb: Secondary | ICD-10-CM | POA: Diagnosis not present

## 2016-07-15 DIAGNOSIS — G5601 Carpal tunnel syndrome, right upper limb: Secondary | ICD-10-CM | POA: Diagnosis not present

## 2016-07-19 ENCOUNTER — Telehealth: Payer: Self-pay | Admitting: Internal Medicine

## 2016-07-19 NOTE — Telephone Encounter (Signed)
Mailed records to [facility] risk adjustment °

## 2016-07-23 ENCOUNTER — Other Ambulatory Visit: Payer: Self-pay | Admitting: Nurse Practitioner

## 2016-08-12 DIAGNOSIS — G5601 Carpal tunnel syndrome, right upper limb: Secondary | ICD-10-CM | POA: Diagnosis not present

## 2016-08-12 DIAGNOSIS — G5602 Carpal tunnel syndrome, left upper limb: Secondary | ICD-10-CM | POA: Diagnosis not present

## 2016-08-12 DIAGNOSIS — G5603 Carpal tunnel syndrome, bilateral upper limbs: Secondary | ICD-10-CM | POA: Diagnosis not present

## 2016-08-30 DIAGNOSIS — G5602 Carpal tunnel syndrome, left upper limb: Secondary | ICD-10-CM | POA: Diagnosis not present

## 2016-08-30 DIAGNOSIS — G5601 Carpal tunnel syndrome, right upper limb: Secondary | ICD-10-CM | POA: Diagnosis not present

## 2016-09-20 DIAGNOSIS — G5602 Carpal tunnel syndrome, left upper limb: Secondary | ICD-10-CM | POA: Diagnosis not present

## 2016-09-20 DIAGNOSIS — G5601 Carpal tunnel syndrome, right upper limb: Secondary | ICD-10-CM | POA: Diagnosis not present

## 2016-10-06 ENCOUNTER — Encounter (HOSPITAL_COMMUNITY): Payer: Self-pay

## 2016-10-06 ENCOUNTER — Other Ambulatory Visit (HOSPITAL_BASED_OUTPATIENT_CLINIC_OR_DEPARTMENT_OTHER): Payer: Medicare HMO

## 2016-10-06 ENCOUNTER — Ambulatory Visit (HOSPITAL_COMMUNITY)
Admission: RE | Admit: 2016-10-06 | Discharge: 2016-10-06 | Disposition: A | Discharge status: Home / Self Care | Payer: Medicare HMO | Source: Ambulatory Visit | Attending: Internal Medicine | Admitting: Internal Medicine

## 2016-10-06 DIAGNOSIS — D47Z2 Castleman disease: Secondary | ICD-10-CM | POA: Diagnosis present

## 2016-10-06 DIAGNOSIS — R590 Localized enlarged lymph nodes: Secondary | ICD-10-CM | POA: Diagnosis not present

## 2016-10-06 DIAGNOSIS — J439 Emphysema, unspecified: Secondary | ICD-10-CM | POA: Diagnosis not present

## 2016-10-06 DIAGNOSIS — K8020 Calculus of gallbladder without cholecystitis without obstruction: Secondary | ICD-10-CM | POA: Insufficient documentation

## 2016-10-06 DIAGNOSIS — D693 Immune thrombocytopenic purpura: Secondary | ICD-10-CM | POA: Diagnosis present

## 2016-10-06 LAB — COMPREHENSIVE METABOLIC PANEL
ALT: 13 U/L (ref 0–55)
AST: 17 U/L (ref 5–34)
Albumin: 4.9 g/dL (ref 3.5–5.0)
Alkaline Phosphatase: 113 U/L (ref 40–150)
Anion Gap: 11 mEq/L (ref 3–11)
BUN: 13.7 mg/dL (ref 7.0–26.0)
CHLORIDE: 103 meq/L (ref 98–109)
CO2: 27 meq/L (ref 22–29)
Calcium: 10.3 mg/dL (ref 8.4–10.4)
Creatinine: 0.8 mg/dL (ref 0.6–1.1)
EGFR: 78 mL/min/{1.73_m2} — AB (ref >90)
GLUCOSE: 95 mg/dL (ref 70–140)
POTASSIUM: 4.5 meq/L (ref 3.5–5.1)
SODIUM: 141 meq/L (ref 136–145)
Total Bilirubin: 0.45 mg/dL (ref 0.20–1.20)
Total Protein: 7.9 g/dL (ref 6.4–8.3)

## 2016-10-06 LAB — LACTATE DEHYDROGENASE: LDH: 158 U/L (ref 125–245)

## 2016-10-06 LAB — CBC WITH DIFFERENTIAL/PLATELET
BASO%: 0.6 % (ref 0.0–2.0)
BASOS ABS: 0.1 10*3/uL (ref 0.0–0.1)
EOS ABS: 0.2 10*3/uL (ref 0.0–0.5)
EOS%: 2 % (ref 0.0–7.0)
HCT: 49.3 % — ABNORMAL HIGH (ref 34.8–46.6)
HGB: 16.8 g/dL — ABNORMAL HIGH (ref 11.6–15.9)
LYMPH%: 32.7 % (ref 14.0–49.7)
MCH: 31.2 pg (ref 25.1–34.0)
MCHC: 34.1 g/dL (ref 31.5–36.0)
MCV: 91.6 fL (ref 79.5–101.0)
MONO#: 0.6 10*3/uL (ref 0.1–0.9)
MONO%: 7.3 % (ref 0.0–14.0)
NEUT#: 4.9 10*3/uL (ref 1.5–6.5)
NEUT%: 57.4 % (ref 38.4–76.8)
Platelets: 252 10*3/uL (ref 145–400)
RBC: 5.38 10*6/uL (ref 3.70–5.45)
RDW: 12.7 % (ref 11.2–14.5)
WBC: 8.5 10*3/uL (ref 3.9–10.3)
lymph#: 2.8 10*3/uL (ref 0.9–3.3)

## 2016-10-06 MED ORDER — IOPAMIDOL (ISOVUE-300) INJECTION 61%
INTRAVENOUS | Status: AC
  Filled 2016-10-06: qty 100

## 2016-10-06 MED ORDER — IOPAMIDOL (ISOVUE-300) INJECTION 61%
100.0000 mL | Freq: Once | INTRAVENOUS | Status: AC | PRN
  Administered 2016-10-06: 100 mL via INTRAVENOUS

## 2016-10-07 ENCOUNTER — Other Ambulatory Visit: Payer: Medicare HMO

## 2016-10-07 DIAGNOSIS — G5611 Other lesions of median nerve, right upper limb: Secondary | ICD-10-CM | POA: Diagnosis not present

## 2016-10-07 DIAGNOSIS — G5602 Carpal tunnel syndrome, left upper limb: Secondary | ICD-10-CM | POA: Diagnosis not present

## 2016-10-07 DIAGNOSIS — G5601 Carpal tunnel syndrome, right upper limb: Secondary | ICD-10-CM | POA: Diagnosis not present

## 2016-10-10 ENCOUNTER — Encounter: Payer: Self-pay | Admitting: Internal Medicine

## 2016-10-10 ENCOUNTER — Ambulatory Visit (HOSPITAL_BASED_OUTPATIENT_CLINIC_OR_DEPARTMENT_OTHER): Payer: Medicare HMO | Admitting: Internal Medicine

## 2016-10-10 ENCOUNTER — Telehealth: Payer: Self-pay | Admitting: Internal Medicine

## 2016-10-10 VITALS — BP 136/88 | HR 81 | Temp 98.0°F | Resp 18 | Wt 176.9 lb

## 2016-10-10 DIAGNOSIS — R590 Localized enlarged lymph nodes: Secondary | ICD-10-CM

## 2016-10-10 DIAGNOSIS — D47Z2 Castleman disease: Secondary | ICD-10-CM

## 2016-10-10 DIAGNOSIS — R911 Solitary pulmonary nodule: Secondary | ICD-10-CM | POA: Insufficient documentation

## 2016-10-10 DIAGNOSIS — R599 Enlarged lymph nodes, unspecified: Secondary | ICD-10-CM

## 2016-10-10 DIAGNOSIS — Z720 Tobacco use: Secondary | ICD-10-CM | POA: Diagnosis not present

## 2016-10-10 DIAGNOSIS — D693 Immune thrombocytopenic purpura: Secondary | ICD-10-CM | POA: Diagnosis not present

## 2016-10-10 HISTORY — DX: Solitary pulmonary nodule: R91.1

## 2016-10-10 NOTE — Patient Instructions (Signed)
Steps to Quit Smoking Smoking tobacco can be bad for your health. It can also affect almost every organ in your body. Smoking puts you and people around you at risk for many serious long-lasting (chronic) diseases. Quitting smoking is hard, but it is one of the best things that you can do for your health. It is never too late to quit. What are the benefits of quitting smoking? When you quit smoking, you lower your risk for getting serious diseases and conditions. They can include:  Lung cancer or lung disease.  Heart disease.  Stroke.  Heart attack.  Not being able to have children (infertility).  Weak bones (osteoporosis) and broken bones (fractures). If you have coughing, wheezing, and shortness of breath, those symptoms may get better when you quit. You may also get sick less often. If you are pregnant, quitting smoking can help to lower your chances of having a baby of low birth weight. What can I do to help me quit smoking? Talk with your doctor about what can help you quit smoking. Some things you can do (strategies) include:  Quitting smoking totally, instead of slowly cutting back how much you smoke over a period of time.  Going to in-person counseling. You are more likely to quit if you go to many counseling sessions.  Using resources and support systems, such as:  Online chats with a counselor.  Phone quitlines.  Printed self-help materials.  Support groups or group counseling.  Text messaging programs.  Mobile phone apps or applications.  Taking medicines. Some of these medicines may have nicotine in them. If you are pregnant or breastfeeding, do not take any medicines to quit smoking unless your doctor says it is okay. Talk with your doctor about counseling or other things that can help you. Talk with your doctor about using more than one strategy at the same time, such as taking medicines while you are also going to in-person counseling. This can help make quitting  easier. What things can I do to make it easier to quit? Quitting smoking might feel very hard at first, but there is a lot that you can do to make it easier. Take these steps:  Talk to your family and friends. Ask them to support and encourage you.  Call phone quitlines, reach out to support groups, or work with a counselor.  Ask people who smoke to not smoke around you.  Avoid places that make you want (trigger) to smoke, such as:  Bars.  Parties.  Smoke-break areas at work.  Spend time with people who do not smoke.  Lower the stress in your life. Stress can make you want to smoke. Try these things to help your stress:  Getting regular exercise.  Deep-breathing exercises.  Yoga.  Meditating.  Doing a body scan. To do this, close your eyes, focus on one area of your body at a time from head to toe, and notice which parts of your body are tense. Try to relax the muscles in those areas.  Download or buy apps on your mobile phone or tablet that can help you stick to your quit plan. There are many free apps, such as QuitGuide from the CDC (Centers for Disease Control and Prevention). You can find more support from smokefree.gov and other websites. This information is not intended to replace advice given to you by your health care provider. Make sure you discuss any questions you have with your health care provider. Document Released: 05/14/2009 Document Revised: 03/15/2016 Document   Reviewed: 12/02/2014 Elsevier Interactive Patient Education  2017 Elsevier Inc.  

## 2016-10-10 NOTE — Telephone Encounter (Signed)
Appointments scheduled per 3/12 LOS. Patient given two bottles of contrast for CT scan appointment, AVS report and calendars with future scheduled appointments.

## 2016-10-10 NOTE — Progress Notes (Signed)
[facility] Telephone:(336) [phone]   Fax:(336) [phone]  OFFICE PROGRESS NOTE  Donnajean Lopes, MD [place] [address]  DIAGNOSIS:  1. Castleman's Disease  2. Idiopathic Thrombocytopenic Purpura. 3. 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  2) Weekly rituximab at 375 mg per meter squared with first cycle given in the hospital. Now status post 2 partial cycles and 7 complete cycles.  3) Maintenance Rituxan 375 mg/M2 every 3 months, status post 12 cycles.  CURRENT THERAPY: Observation.  INTERVAL HISTORY: [name] 61 y.o. female came to the clinic today for follow-up visit. The patient is feeling fine with no specific complaints. Unfortunately she continues to smoke and also doring electronic cigarette. She denied having any chest pain, shortness of breath, cough or hemoptysis. She has no fever or chills. She denied having any weight loss or night sweats. She has no fever or chills. She had repeat CT scan of the chest, abdomen and pelvis performed recently and she is here for evaluation and discussion of her scan results.   MEDICAL HISTORY: Past Medical History:  Diagnosis Date  . Castleman's disease ([place])   . Chronic kidney disease    failure  . Heart attack 2007  . Heart attack 2007  . Hypertension   . Hypothyroidism   . ITP (idiopathic thrombocytopenic purpura) 06/17/2011  . Retroperitoneal lymphadenopathy 2012    ALLERGIES:  is allergic to cefdinir; levaquin [levofloxacin in d5w]; levofloxacin; and vicodin [hydrocodone-acetaminophen].  MEDICATIONS:  Current Outpatient Prescriptions  Medication Sig Dispense Refill  . ALPRAZolam (XANAX) 0.25 MG tablet Take 0.25 mg by mouth at bedtime as needed. For sleep    . CRESTOR 10 MG tablet     . ibuprofen (ADVIL,MOTRIN) 200 MG tablet Take 400 mg  by mouth every 6 (six) hours as needed for pain or headache.    . levothyroxine (SYNTHROID, LEVOTHROID) 112 MCG tablet Take 112 mcg by mouth daily before breakfast.     . metoprolol succinate (TOPROL-XL) 25 MG 24 hr tablet Take 50 mg by mouth 2 (two) times daily.     . RiTUXimab (RITUXAN IV) Inject 1 application into the vein every 8 (eight) weeks. Through port in chest  at [facility], [place] last dose was 10/23/12     No current facility-administered medications for this visit.    Facility-Administered Medications Ordered in Other Visits  Medication Dose Route Frequency Provider Last Rate Last Dose  . sodium chloride 0.9 % injection 10 mL  10 mL Intracatheter PRN Curt Bears, MD        SURGICAL HISTORY:  Past Surgical History:  Procedure Laterality Date  . APPENDECTOMY     61 years old  . CESAREAN SECTION  1983 1989    REVIEW OF SYSTEMS:  A comprehensive review of systems was negative.   PHYSICAL EXAMINATION: General appearance: alert, cooperative and no distress Head: Normocephalic, without obvious abnormality, atraumatic Neck: no adenopathy Lymph nodes: Cervical, supraclavicular, and axillary nodes normal. Resp: clear to auscultation bilaterally Back: symmetric, no curvature. ROM normal. No CVA tenderness. Cardio: regular rate and rhythm, S1, S2 normal, no murmur, click, rub or gallop GI: soft, non-tender; bowel sounds normal; no masses,  no organomegaly Extremities: extremities normal, atraumatic, no cyanosis or edema  ECOG PERFORMANCE STATUS: 1 - Symptomatic but completely ambulatory  Blood pressure 136/88, pulse 81, temperature  16 F (36.7 C), temperature source Oral, resp. rate 18, weight 176 lb 14.4 oz (80.2 kg), SpO2 95 %.  LABORATORY DATA: Lab Results  Component Value Date   WBC 8.5 10/06/2016   HGB 16.8 (H) 10/06/2016   HCT 49.3 (H) 10/06/2016   MCV 91.6 10/06/2016   PLT 252 10/06/2016      Chemistry      Component Value Date/Time     NA 141 10/06/2016 1011   K 4.5 10/06/2016 1011   CL 106 01/18/2013 0851   CO2 27 10/06/2016 1011   BUN 13.7 10/06/2016 1011   CREATININE 0.8 10/06/2016 1011      Component Value Date/Time   CALCIUM 10.3 10/06/2016 1011   ALKPHOS 113 10/06/2016 1011   AST 17 10/06/2016 1011   ALT 13 10/06/2016 1011   BILITOT 0.45 10/06/2016 1011       RADIOGRAPHIC STUDIES: Ct Chest W Contrast  Addendum Date: 10/06/2016   ADDENDUM REPORT: 10/06/2016 14:22 ADDENDUM: As mentioned in the body of the report, there is a 6 mm nodule in the right lung apex. No follow-up needed if patient is low-risk. Non-contrast chest CT can be considered in 12 months if patient is high-risk. This recommendation follows the consensus statement: Guidelines for Management of Incidental Pulmonary Nodules Detected on CT Images: From the Fleischner Society 2017; Radiology 2017; 284:228-243. Electronically Signed   By: Misty [place] M.D.   On: 10/06/2016 14:22   Result Date: 10/06/2016 CLINICAL DATA:  Restaging calcium ends disease. EXAM: CT CHEST, ABDOMEN, AND PELVIS WITH CONTRAST TECHNIQUE: Multidetector CT imaging of the chest, abdomen and pelvis was performed following the standard protocol during bolus administration of intravenous contrast. CONTRAST:  140mL ISOVUE-300 IOPAMIDOL (ISOVUE-300) INJECTION 61% COMPARISON:  10/06/2015 FINDINGS: CT CHEST FINDINGS Cardiovascular: The heart size is normal. No pericardial effusion. Coronary artery calcification is noted. Atherosclerotic calcification is noted in the wall of the thoracic aorta. Mediastinum/Nodes: No mediastinal lymphadenopathy. There is no hilar lymphadenopathy. The esophagus has normal imaging features. Slight increase and subpectoral and left axillary lymph nodes. The 9 mm short axis left subpectoral lymph node measured previously is now 11 mm (image 11 series 2). 12 mm short axis right axillary lymph node seen image 14 series 2 today was 6 mm previously. Lungs/Pleura:  Centrilobular emphysema again noted. 6 mm medial left upper lobe nodule (image 34 series 4) is new in the interval. Tiny posterior left apical nodule seen on the previous study is unchanged. Musculoskeletal: Bone windows reveal no worrisome lytic or sclerotic osseous lesions. CT ABDOMEN PELVIS FINDINGS Hepatobiliary: No focal abnormality within the liver parenchyma. Tiny calcified gallstones evident. No intrahepatic or extrahepatic biliary dilation. Pancreas: No focal mass lesion. No dilatation of the main duct. No intraparenchymal cyst. No peripancreatic edema. Spleen: No splenomegaly. No focal mass lesion. Adrenals/Urinary Tract: No adrenal nodule or mass. Kidneys are unremarkable. No evidence for hydroureter. The urinary bladder appears normal for the degree of distention. Stomach/Bowel: Stomach is nondistended. No gastric wall thickening. No evidence of outlet obstruction. Duodenum is normally positioned as is the ligament of Treitz. No small bowel wall thickening. No small bowel dilatation. The terminal ileum is normal. The appendix is not visualized, but there is no edema or inflammation in the region of the cecum. Diverticular changes are noted in the left colon without evidence of diverticulitis. Vascular/Lymphatic: There is abdominal aortic atherosclerosis without aneurysm. Small retroperitoneal lymph nodes appear stable in the interval. The 7 mm lymph node measured anterior to the aorta on  the prior study is stable at 7 mm (image 67 series 2). Left para-aortic lymph node measured previously 11 x 13 mm measures 10 x 14 mm today (image 71 series 2). No pelvic sidewall lymphadenopathy. Reproductive: The uterus has normal CT imaging appearance. There is no adnexal mass. Other: No intraperitoneal free fluid. Musculoskeletal: Bone windows reveal no worrisome lytic or sclerotic osseous lesions. IMPRESSION: 1. Slight interval progression of mild subpectoral and left axillary lymphadenopathy. While the thinner  slice collimation used on today's study may make these lymph nodes more conspicuous, the left axillary index lymph node measured today had a fatty hilum previously, a finding that is no longer evident. The retroperitoneal lymph nodes seen previously are stable. Otherwise no new or progressive findings. 2. Cholelithiasis 3.  Emphysema. (ICD10-J43.9) 4.  Abdominal Aortic Atherosclerois (ICD10-170.0) Electronically Signed: By: Misty [place] M.D. On: 10/06/2016 14:16   Ct Abdomen Pelvis W Contrast  Addendum Date: 10/06/2016   ADDENDUM REPORT: 10/06/2016 14:22 ADDENDUM: As mentioned in the body of the report, there is a 6 mm nodule in the right lung apex. No follow-up needed if patient is low-risk. Non-contrast chest CT can be considered in 12 months if patient is high-risk. This recommendation follows the consensus statement: Guidelines for Management of Incidental Pulmonary Nodules Detected on CT Images: From the Fleischner Society 2017; Radiology 2017; 284:228-243. Electronically Signed   By: Misty [place] M.D.   On: 10/06/2016 14:22   Result Date: 10/06/2016 CLINICAL DATA:  Restaging calcium ends disease. EXAM: CT CHEST, ABDOMEN, AND PELVIS WITH CONTRAST TECHNIQUE: Multidetector CT imaging of the chest, abdomen and pelvis was performed following the standard protocol during bolus administration of intravenous contrast. CONTRAST:  170mL ISOVUE-300 IOPAMIDOL (ISOVUE-300) INJECTION 61% COMPARISON:  10/06/2015 FINDINGS: CT CHEST FINDINGS Cardiovascular: The heart size is normal. No pericardial effusion. Coronary artery calcification is noted. Atherosclerotic calcification is noted in the wall of the thoracic aorta. Mediastinum/Nodes: No mediastinal lymphadenopathy. There is no hilar lymphadenopathy. The esophagus has normal imaging features. Slight increase and subpectoral and left axillary lymph nodes. The 9 mm short axis left subpectoral lymph node measured previously is now 11 mm (image 11 series 2). 12 mm short  axis right axillary lymph node seen image 14 series 2 today was 6 mm previously. Lungs/Pleura: Centrilobular emphysema again noted. 6 mm medial left upper lobe nodule (image 34 series 4) is new in the interval. Tiny posterior left apical nodule seen on the previous study is unchanged. Musculoskeletal: Bone windows reveal no worrisome lytic or sclerotic osseous lesions. CT ABDOMEN PELVIS FINDINGS Hepatobiliary: No focal abnormality within the liver parenchyma. Tiny calcified gallstones evident. No intrahepatic or extrahepatic biliary dilation. Pancreas: No focal mass lesion. No dilatation of the main duct. No intraparenchymal cyst. No peripancreatic edema. Spleen: No splenomegaly. No focal mass lesion. Adrenals/Urinary Tract: No adrenal nodule or mass. Kidneys are unremarkable. No evidence for hydroureter. The urinary bladder appears normal for the degree of distention. Stomach/Bowel: Stomach is nondistended. No gastric wall thickening. No evidence of outlet obstruction. Duodenum is normally positioned as is the ligament of Treitz. No small bowel wall thickening. No small bowel dilatation. The terminal ileum is normal. The appendix is not visualized, but there is no edema or inflammation in the region of the cecum. Diverticular changes are noted in the left colon without evidence of diverticulitis. Vascular/Lymphatic: There is abdominal aortic atherosclerosis without aneurysm. Small retroperitoneal lymph nodes appear stable in the interval. The 7 mm lymph node measured anterior to the aorta on the  prior study is stable at 7 mm (image 67 series 2). Left para-aortic lymph node measured previously 11 x 13 mm measures 10 x 14 mm today (image 71 series 2). No pelvic sidewall lymphadenopathy. Reproductive: The uterus has normal CT imaging appearance. There is no adnexal mass. Other: No intraperitoneal free fluid. Musculoskeletal: Bone windows reveal no worrisome lytic or sclerotic osseous lesions. IMPRESSION: 1. Slight  interval progression of mild subpectoral and left axillary lymphadenopathy. While the thinner slice collimation used on today's study may make these lymph nodes more conspicuous, the left axillary index lymph node measured today had a fatty hilum previously, a finding that is no longer evident. The retroperitoneal lymph nodes seen previously are stable. Otherwise no new or progressive findings. 2. Cholelithiasis 3.  Emphysema. (ICD10-J43.9) 4.  Abdominal Aortic Atherosclerois (ICD10-170.0) Electronically Signed: By: Misty [place] M.D. On: 10/06/2016 14:16   ASSESSMENT AND PLAN:  This is a very pleasant 61 years old white female with Castleman disease status post induction treatment with Rituxan followed by maintenance Rituxan for 12 cycles and has been on observation for the last few years. The patient had repeat CT scan of the chest, abdomen and pelvis performed recently that showed mild increase in the subpectoral and left axillary lymphadenopathy. There was also new left upper lobe pulmonary nodule. I personally reviewed the scan images and discuss the results with the patient today and showed her the images. I recommended for the patient to continue on observation for now since she has mild disease progression. I will see her back for follow-up visit in 6 months for reevaluation with repeat CT scan of the chest, abdomen and pelvis for restaging of her disease. For smoking cessation, strongly encouraged the patient to quit smoking and offered her smoke cessation program. She was advised to call immediately if she has any concerning symptoms in the interval. All questions were answered. The patient knows to call the clinic with any problems, questions or concerns. We can certainly see the patient much sooner if necessary. I spent 10 minutes counseling the patient face to face. The total time spent in the appointment was 15 minutes.  Disclaimer: This note was dictated with voice recognition software.  Similar sounding words can inadvertently be transcribed and may not be corrected upon review.

## 2016-10-21 DIAGNOSIS — G5601 Carpal tunnel syndrome, right upper limb: Secondary | ICD-10-CM | POA: Diagnosis not present

## 2016-11-04 DIAGNOSIS — Z01810 Encounter for preprocedural cardiovascular examination: Secondary | ICD-10-CM | POA: Diagnosis not present

## 2016-11-04 DIAGNOSIS — R69 Illness, unspecified: Secondary | ICD-10-CM | POA: Diagnosis not present

## 2016-11-04 DIAGNOSIS — E039 Hypothyroidism, unspecified: Secondary | ICD-10-CM | POA: Diagnosis not present

## 2016-11-04 DIAGNOSIS — I252 Old myocardial infarction: Secondary | ICD-10-CM | POA: Diagnosis not present

## 2016-11-04 DIAGNOSIS — Z716 Tobacco abuse counseling: Secondary | ICD-10-CM | POA: Diagnosis not present

## 2016-11-04 DIAGNOSIS — E785 Hyperlipidemia, unspecified: Secondary | ICD-10-CM | POA: Diagnosis not present

## 2016-11-04 DIAGNOSIS — I2510 Atherosclerotic heart disease of native coronary artery without angina pectoris: Secondary | ICD-10-CM | POA: Diagnosis not present

## 2016-11-04 DIAGNOSIS — I10 Essential (primary) hypertension: Secondary | ICD-10-CM | POA: Diagnosis not present

## 2016-11-04 DIAGNOSIS — Z720 Tobacco use: Secondary | ICD-10-CM | POA: Diagnosis not present

## 2016-11-11 ENCOUNTER — Telehealth: Payer: Self-pay | Admitting: Medical Oncology

## 2016-11-11 NOTE — Telephone Encounter (Signed)
Returned pt call- left message to return my call.

## 2016-11-11 NOTE — Telephone Encounter (Signed)
She is asking if it is okay for her to have carpel tunnel surgery - I told her it should not be a problem but that the surgeon should get clearance from her PCP.

## 2016-11-14 DIAGNOSIS — G5601 Carpal tunnel syndrome, right upper limb: Secondary | ICD-10-CM | POA: Diagnosis not present

## 2016-11-25 DIAGNOSIS — G5601 Carpal tunnel syndrome, right upper limb: Secondary | ICD-10-CM | POA: Diagnosis not present

## 2016-11-25 DIAGNOSIS — Z4789 Encounter for other orthopedic aftercare: Secondary | ICD-10-CM | POA: Diagnosis not present

## 2016-12-07 DIAGNOSIS — E039 Hypothyroidism, unspecified: Secondary | ICD-10-CM | POA: Diagnosis not present

## 2016-12-07 DIAGNOSIS — J019 Acute sinusitis, unspecified: Secondary | ICD-10-CM | POA: Diagnosis not present

## 2016-12-07 DIAGNOSIS — R05 Cough: Secondary | ICD-10-CM | POA: Diagnosis not present

## 2016-12-07 DIAGNOSIS — Z6829 Body mass index (BMI) 29.0-29.9, adult: Secondary | ICD-10-CM | POA: Diagnosis not present

## 2017-01-23 DIAGNOSIS — G5601 Carpal tunnel syndrome, right upper limb: Secondary | ICD-10-CM | POA: Diagnosis not present

## 2017-01-27 ENCOUNTER — Emergency Department (HOSPITAL_COMMUNITY): Payer: Medicare HMO

## 2017-01-27 ENCOUNTER — Emergency Department (HOSPITAL_COMMUNITY)
Admission: EM | Admit: 2017-01-27 | Discharge: 2017-01-27 | Disposition: A | Discharge status: Home / Self Care | Payer: Medicare HMO | Attending: Emergency Medicine | Admitting: Emergency Medicine

## 2017-01-27 DIAGNOSIS — R69 Illness, unspecified: Secondary | ICD-10-CM | POA: Diagnosis not present

## 2017-01-27 DIAGNOSIS — I129 Hypertensive chronic kidney disease with stage 1 through stage 4 chronic kidney disease, or unspecified chronic kidney disease: Secondary | ICD-10-CM | POA: Insufficient documentation

## 2017-01-27 DIAGNOSIS — N189 Chronic kidney disease, unspecified: Secondary | ICD-10-CM | POA: Diagnosis not present

## 2017-01-27 DIAGNOSIS — F17210 Nicotine dependence, cigarettes, uncomplicated: Secondary | ICD-10-CM | POA: Insufficient documentation

## 2017-01-27 DIAGNOSIS — E039 Hypothyroidism, unspecified: Secondary | ICD-10-CM | POA: Diagnosis not present

## 2017-01-27 DIAGNOSIS — R29810 Facial weakness: Secondary | ICD-10-CM | POA: Insufficient documentation

## 2017-01-27 LAB — DIFFERENTIAL
BASOS PCT: 1 %
Basophils Absolute: 0.1 10*3/uL (ref 0.0–0.1)
EOS PCT: 2 %
Eosinophils Absolute: 0.2 10*3/uL (ref 0.0–0.7)
Lymphocytes Relative: 21 %
Lymphs Abs: 2.3 10*3/uL (ref 0.7–4.0)
MONO ABS: 0.7 10*3/uL (ref 0.1–1.0)
Monocytes Relative: 6 %
NEUTROS ABS: 7.7 10*3/uL (ref 1.7–7.7)
Neutrophils Relative %: 70 %

## 2017-01-27 LAB — COMPREHENSIVE METABOLIC PANEL
ALT: 13 U/L — ABNORMAL LOW (ref 14–54)
ANION GAP: 8 (ref 5–15)
AST: 19 U/L (ref 15–41)
Albumin: 4.8 g/dL (ref 3.5–5.0)
Alkaline Phosphatase: 96 U/L (ref 38–126)
BUN: 12 mg/dL (ref 6–20)
CHLORIDE: 106 mmol/L (ref 101–111)
CO2: 24 mmol/L (ref 22–32)
Calcium: 9.7 mg/dL (ref 8.9–10.3)
Creatinine, Ser: 0.77 mg/dL (ref 0.44–1.00)
GFR calc non Af Amer: >60 mL/min (ref >60)
Glucose, Bld: 116 mg/dL — ABNORMAL HIGH (ref 65–99)
Potassium: 4.7 mmol/L (ref 3.5–5.1)
SODIUM: 138 mmol/L (ref 135–145)
Total Bilirubin: 0.6 mg/dL (ref 0.3–1.2)
Total Protein: 7.4 g/dL (ref 6.5–8.1)

## 2017-01-27 LAB — I-STAT CHEM 8, ED
BUN: 17 mg/dL (ref 6–20)
CHLORIDE: 104 mmol/L (ref 101–111)
Calcium, Ion: 1.21 mmol/L (ref 1.15–1.40)
Creatinine, Ser: 0.7 mg/dL (ref 0.44–1.00)
Glucose, Bld: 113 mg/dL — ABNORMAL HIGH (ref 65–99)
HCT: 47 % — ABNORMAL HIGH (ref 36.0–46.0)
HEMOGLOBIN: 16 g/dL — AB (ref 12.0–15.0)
POTASSIUM: 4.6 mmol/L (ref 3.5–5.1)
SODIUM: 140 mmol/L (ref 135–145)
TCO2: 26 mmol/L (ref 0–100)

## 2017-01-27 LAB — CBC
HCT: 47.1 % — ABNORMAL HIGH (ref 36.0–46.0)
Hemoglobin: 15.8 g/dL — ABNORMAL HIGH (ref 12.0–15.0)
MCH: 31.2 pg (ref 26.0–34.0)
MCHC: 33.5 g/dL (ref 30.0–36.0)
MCV: 93.1 fL (ref 78.0–100.0)
PLATELETS: 187 10*3/uL (ref 150–400)
RBC: 5.06 MIL/uL (ref 3.87–5.11)
RDW: 13.5 % (ref 11.5–15.5)
WBC: 10.9 10*3/uL — AB (ref 4.0–10.5)

## 2017-01-27 LAB — I-STAT TROPONIN, ED: Troponin i, poc: 0 ng/mL (ref 0.00–0.08)

## 2017-01-27 LAB — APTT: aPTT: 26 seconds (ref 24–36)

## 2017-01-27 LAB — PROTIME-INR
INR: 0.98
PROTHROMBIN TIME: 13 s (ref 11.4–15.2)

## 2017-01-27 LAB — TSH: TSH: 1.076 u[IU]/mL (ref 0.350–4.500)

## 2017-01-27 MED ORDER — PREDNISONE 20 MG PO TABS: ORAL_TABLET | ORAL | 0 refills | Status: DC

## 2017-01-27 MED ORDER — ASPIRIN 81 MG PO CHEW
324.0000 mg | CHEWABLE_TABLET | Freq: Once | ORAL | Status: AC
  Administered 2017-01-27: 324 mg via ORAL
  Filled 2017-01-27: qty 4

## 2017-01-27 MED ORDER — LORAZEPAM 1 MG PO TABS
1.0000 mg | ORAL_TABLET | Freq: Once | ORAL | Status: AC
  Administered 2017-01-27: 1 mg via ORAL
  Filled 2017-01-27: qty 1

## 2017-01-27 MED ORDER — VALACYCLOVIR HCL 1 G PO TABS: 1000.0000 mg | ORAL_TABLET | Freq: Three times a day (TID) | ORAL | 0 refills | Status: DC

## 2017-01-27 NOTE — ED Notes (Signed)
[place] MD aware of pt presentation, [place] not to be called a this time per MD

## 2017-01-27 NOTE — ED Notes (Signed)
Pt given bag lunch with EDP permission. 

## 2017-01-27 NOTE — ED Notes (Signed)
Patient transported to CT 

## 2017-01-27 NOTE — ED Triage Notes (Signed)
Pt reports R lip drooping onset x 2-3 days ago, pt states, "I was talking funny too & then it went away. Then last night I got severe neck pain but when I woke up this morning at 9am I got numbness in my right hand and my lip drooped again and it felt like I had too much to drink." pt A&O x4, pt has R sided facial droop in triage

## 2017-01-27 NOTE — ED Notes (Signed)
ED Provider at bedside. 

## 2017-01-27 NOTE — ED Provider Notes (Signed)
10:42 AM Called by triage, re: possible Code Stroke. Waxing waning symptoms for a couple days. With pain in neck this may be a cervical radiculopathy. Regardless, pt can't give exact onset of current symptoms or when last normal. Sounds like she may have noticed when she woke up this morning.    Virgel Manifold, MD 01/27/17 1045

## 2017-01-27 NOTE — ED Provider Notes (Signed)
Emergency Department Provider Note   I have reviewed the triage vital signs and the nursing notes.   HISTORY  Chief Complaint Facial Droop   HPI [name] is a 61 y.o. female with PMH of HTN, Hypothyroidism, and CKD presents to the emergency department for evaluation of intermittent right face weakness and slurred speech over the last 2 days. Symptoms initially began 2 days ago the patient felt like the right side of her face was drooping and she had some difficulty with speech. No upper or lower extremity weakness at the time. Symptoms resolved without specific treatment. She had some generalized shoulder and neck discomfort yesterday and symptoms returned including right face weakness and slurred speech. She notes some intermittent right upper extremity weakness and numbness which she attributes to carpal tunnel syndrome. She states she has seen an orthopedic surgeon who did surgery for carpal tunnel but her symptoms are not better. No prior history of stroke. No difficulty walking or word finding difficulty. No choking or difficulty swallowing. No CP or heart palpitations.    Past Medical History:  Diagnosis Date  . 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 Chronic kidney disease    failure  . Heart attack 2007  . Heart attack 2007  . Hypertension   . Hypothyroidism   . ITP (idiopathic thrombocytopenic purpura) 06/17/2011  . Pulmonary nodule 10/10/2016  . Retroperitoneal lymphadenopathy 2012    Patient Active Problem List   Diagnosis Date Noted  . Pulmonary nodule 10/10/2016  . 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 ITP (idiopathic thrombocytopenic purpura) 06/17/2011  . Castleman disease ([place]) 06/13/2011  . UNSPECIFIED HYPOTHYROIDISM 10/08/2010  . UNSPECIFIED DISORDER OF ADRENAL GLANDS 10/08/2010  . CAD 10/08/2010  . CONSTIPATION 10/08/2010  . Enlarged lymph nodes 10/08/2010    Past Surgical History:  Procedure Laterality Date  . APPENDECTOMY     61 years old  .  CESAREAN SECTION  1983 1989    Current Outpatient Rx  . Order #: [id number] Class: Historical Med  . Order #: [id number] Class: Historical Med  . Order #: [id number] Class: Historical Med  . Order #: [id number] Class: Historical Med    Allergies Cefdinir; Levofloxacin; and Vicodin [hydrocodone-acetaminophen]  Family History  Problem Relation Age of Onset  . Cancer Mother        lung  . Heart disease Father     Social History Social History  Substance Use Topics  . Smoking status: Current Every Day Smoker    Packs/day: 0.50    Years: 25.00    Types: Cigarettes  . Smokeless tobacco: Current User     Comment: vapor cig sometimes  . Alcohol use Yes    Review of Systems  Constitutional: No fever/chills Eyes: No visual changes. ENT: No sore throat. Cardiovascular: Denies chest pain. Respiratory: Denies shortness of breath. Gastrointestinal: No abdominal pain.  No nausea, no vomiting.  No diarrhea.  No constipation. Genitourinary: Negative for dysuria. Musculoskeletal: Negative for back pain. Skin: Negative for rash. Neurological: Negative for headaches. Right face droop and slurred speech.   10-point ROS otherwise negative.  ____________________________________________   PHYSICAL EXAM:  VITAL SIGNS: ED Triage Vitals  Enc Vitals Group     BP 01/27/17 1028 (!) 144/99     Pulse Rate 01/27/17 1028 82     Resp 01/27/17 1028 19     Temp 01/27/17 1028 98.1 F (36.7 C)     Temp Source 01/27/17 1028 Oral     SpO2 01/27/17 1028 98 %     Weight  01/27/17 1028 175 lb (79.4 kg)     Height 01/27/17 1028 5\' 3"  (1.6 m)     Pain Score 01/27/17 1204 0   Constitutional: Alert and oriented. Well appearing and in no acute distress. Eyes: Conjunctivae are normal. PERRL. EOMI. Head: Atraumatic. Nose: No congestion/rhinnorhea. Mouth/Throat: Mucous membranes are moist.  Oropharynx non-erythematous. Neck: No stridor.   Cardiovascular: Normal rate, regular rhythm. Good peripheral  circulation. Grossly normal heart sounds.   Respiratory: Normal respiratory effort.  No retractions. Lungs CTAB. Gastrointestinal: Soft and nontender. No distention.  Musculoskeletal: No lower extremity tenderness nor edema. No gross deformities of extremities. Neurologic:  Normal speech and language. Droop at the corner of the mouth on the right with slight diminished nasolabial fold. Normal sensation over face. Symmetrical forehead movement. No pronator drift or other neurological deficit in the upper or lower extremities.  Skin:  Skin is warm, dry and intact. No rash noted. Psychiatric: Mood and affect are normal. Speech and behavior are normal.  ____________________________________________   LABS (all labs ordered are listed, but only abnormal results are displayed)  Labs Reviewed  CBC - Abnormal; Notable for the following:       Result Value   WBC 10.9 (*)    Hemoglobin 15.8 (*)    HCT 47.1 (*)    All other components within normal limits  COMPREHENSIVE METABOLIC PANEL - Abnormal; Notable for the following:    Glucose, Bld 116 (*)    ALT 13 (*)    All other components within normal limits  I-STAT CHEM 8, ED - Abnormal; Notable for the following:    Glucose, Bld 113 (*)    Hemoglobin 16.0 (*)    HCT 47.0 (*)    All other components within normal limits  PROTIME-INR  APTT  DIFFERENTIAL  I-STAT TROPOININ, ED  CBG MONITORING, ED   ____________________________________________  EKG   EKG Interpretation  Date/Time:  Friday January 27 2017 10:21:12 EDT Ventricular Rate:  86 PR Interval:  150 QRS Duration: 82 QT Interval:  382 QTC Calculation: 457 R Axis:   76 Text Interpretation:  Normal sinus rhythm Abnormal ECG No STEMI.  Confirmed by Nanda Quinton [phone]) on 01/27/2017 12:50:31 PM       ____________________________________________  RADIOLOGY  Ct Head Wo Contrast  Result Date: 01/27/2017 CLINICAL DATA:  Facial droop on the right for 2 days, initial encounter EXAM:  CT HEAD WITHOUT CONTRAST TECHNIQUE: Contiguous axial images were obtained from the base of the skull through the vertex without intravenous contrast. COMPARISON:  08/15/2010 FINDINGS: Brain: There are changes consistent with prior infarct in the left parietal region new from the prior exam. No acute hemorrhage, acute infarction or space-occupying mass lesion is identified. Vascular: No hyperdense vessel or unexpected calcification. Skull: Normal. Negative for fracture or focal lesion. Sinuses/Orbits: No acute finding. Other: None. IMPRESSION: Changes consistent with prior left parietal infarct. No acute abnormality noted. Electronically Signed   By: Inez Catalina M.D.   On: 01/27/2017 11:32    ____________________________________________   PROCEDURES  Procedure(s) performed:   Procedures  None ____________________________________________   INITIAL IMPRESSION / ASSESSMENT AND PLAN / ED COURSE  Pertinent labs & imaging results that were available during my care of the patient were reviewed by me and considered in my medical decision making (see chart for details).  Patient resents to the emergency department for evaluation of right face droop and slurred speech. The forehead is spared. No ear pain. Lower suspicion that this is a peripheral nerve  issue. CT scan of the head shows an old left parietal infarct but nothing acute. Discussed the case with neurology on-call Dr. Cristobal Goldmann who recommended MRI brain and return phone call. Gave full-dose ASA.   MRI pending. Care transferred to Dr. Tyrone Nine who will follow MRI and reassess. If negative for acute stroke would treat and peripheral nerve issue. No difficulty closing eyes, eye pain, or dryness.  ____________________________________________  FINAL CLINICAL IMPRESSION(S) / ED DIAGNOSES  Final diagnoses:  Facial droop     MEDICATIONS GIVEN DURING THIS VISIT:  Medications  aspirin chewable tablet 324 mg (324 mg Oral Given 01/27/17 1210)     Note:  This document was prepared using Dragon voice recognition software and may include unintentional dictation errors.  Nanda Quinton, MD Emergency Medicine    Antonique Langford, [facility], MD 01/27/17 [phone]

## 2017-01-27 NOTE — ED Notes (Signed)
Pt updated on timeline of care. EDP notified of pt's desire for anxiety meds prior to MRI.

## 2017-02-02 ENCOUNTER — Encounter (HOSPITAL_COMMUNITY): Payer: Self-pay | Admitting: Emergency Medicine

## 2017-02-02 ENCOUNTER — Emergency Department (HOSPITAL_COMMUNITY)
Admission: EM | Admit: 2017-02-02 | Discharge: 2017-02-02 | Disposition: A | Discharge status: Home / Self Care | Payer: Medicare HMO

## 2017-02-02 ENCOUNTER — Telehealth: Payer: Self-pay | Admitting: *Deleted

## 2017-02-02 ENCOUNTER — Emergency Department (HOSPITAL_COMMUNITY): Payer: Medicare HMO

## 2017-02-02 ENCOUNTER — Inpatient Hospital Stay (HOSPITAL_COMMUNITY)
Admission: EM | Admit: 2017-02-02 | Discharge: 2017-02-04 | DRG: 445 | Disposition: A | Discharge status: Home / Self Care | Payer: Medicare HMO | Attending: Internal Medicine | Admitting: Internal Medicine

## 2017-02-02 ENCOUNTER — Telehealth: Payer: Self-pay | Admitting: Emergency Medicine

## 2017-02-02 DIAGNOSIS — R1011 Right upper quadrant pain: Secondary | ICD-10-CM

## 2017-02-02 DIAGNOSIS — Y92009 Unspecified place in unspecified non-institutional (private) residence as the place of occurrence of the external cause: Secondary | ICD-10-CM

## 2017-02-02 DIAGNOSIS — D72829 Elevated white blood cell count, unspecified: Secondary | ICD-10-CM | POA: Diagnosis not present

## 2017-02-02 DIAGNOSIS — T380X5A Adverse effect of glucocorticoids and synthetic analogues, initial encounter: Secondary | ICD-10-CM | POA: Diagnosis present

## 2017-02-02 DIAGNOSIS — E039 Hypothyroidism, unspecified: Secondary | ICD-10-CM | POA: Diagnosis not present

## 2017-02-02 DIAGNOSIS — R109 Unspecified abdominal pain: Secondary | ICD-10-CM

## 2017-02-02 DIAGNOSIS — I252 Old myocardial infarction: Secondary | ICD-10-CM

## 2017-02-02 DIAGNOSIS — K8020 Calculus of gallbladder without cholecystitis without obstruction: Secondary | ICD-10-CM | POA: Diagnosis not present

## 2017-02-02 DIAGNOSIS — D47Z2 Castleman disease: Secondary | ICD-10-CM | POA: Diagnosis not present

## 2017-02-02 DIAGNOSIS — G510 Bell's palsy: Secondary | ICD-10-CM

## 2017-02-02 DIAGNOSIS — J9811 Atelectasis: Secondary | ICD-10-CM | POA: Diagnosis present

## 2017-02-02 DIAGNOSIS — I10 Essential (primary) hypertension: Secondary | ICD-10-CM | POA: Diagnosis present

## 2017-02-02 DIAGNOSIS — F17210 Nicotine dependence, cigarettes, uncomplicated: Secondary | ICD-10-CM | POA: Diagnosis present

## 2017-02-02 DIAGNOSIS — D693 Immune thrombocytopenic purpura: Secondary | ICD-10-CM | POA: Diagnosis present

## 2017-02-02 DIAGNOSIS — Z8249 Family history of ischemic heart disease and other diseases of the circulatory system: Secondary | ICD-10-CM

## 2017-02-02 DIAGNOSIS — Z881 Allergy status to other antibiotic agents status: Secondary | ICD-10-CM

## 2017-02-02 DIAGNOSIS — G4700 Insomnia, unspecified: Secondary | ICD-10-CM | POA: Diagnosis present

## 2017-02-02 DIAGNOSIS — Z885 Allergy status to narcotic agent status: Secondary | ICD-10-CM

## 2017-02-02 DIAGNOSIS — Z801 Family history of malignant neoplasm of trachea, bronchus and lung: Secondary | ICD-10-CM

## 2017-02-02 LAB — COMPREHENSIVE METABOLIC PANEL
ALK PHOS: 94 U/L (ref 38–126)
ALT: 13 U/L — AB (ref 14–54)
AST: 15 U/L (ref 15–41)
Albumin: 4.7 g/dL (ref 3.5–5.0)
Anion gap: 9 (ref 5–15)
BILIRUBIN TOTAL: 0.4 mg/dL (ref 0.3–1.2)
BUN: 17 mg/dL (ref 6–20)
CALCIUM: 9.4 mg/dL (ref 8.9–10.3)
CO2: 27 mmol/L (ref 22–32)
CREATININE: 0.92 mg/dL (ref 0.44–1.00)
Chloride: 103 mmol/L (ref 101–111)
GFR calc non Af Amer: >60 mL/min (ref >60)
GLUCOSE: 130 mg/dL — AB (ref 65–99)
Potassium: 4.7 mmol/L (ref 3.5–5.1)
SODIUM: 139 mmol/L (ref 135–145)
TOTAL PROTEIN: 7.6 g/dL (ref 6.5–8.1)

## 2017-02-02 LAB — URINALYSIS, ROUTINE W REFLEX MICROSCOPIC
BACTERIA UA: NONE SEEN
BILIRUBIN URINE: NEGATIVE
Glucose, UA: NEGATIVE mg/dL
HGB URINE DIPSTICK: NEGATIVE
KETONES UR: NEGATIVE mg/dL
Nitrite: NEGATIVE
PROTEIN: NEGATIVE mg/dL
SPECIFIC GRAVITY, URINE: 1.015 (ref 1.005–1.030)
pH: 5 (ref 5.0–8.0)

## 2017-02-02 LAB — CBC
HCT: 43.8 % (ref 36.0–46.0)
Hemoglobin: 15.1 g/dL — ABNORMAL HIGH (ref 12.0–15.0)
MCH: 31.6 pg (ref 26.0–34.0)
MCHC: 34.5 g/dL (ref 30.0–36.0)
MCV: 91.6 fL (ref 78.0–100.0)
PLATELETS: 233 10*3/uL (ref 150–400)
RBC: 4.78 MIL/uL (ref 3.87–5.11)
RDW: 13.2 % (ref 11.5–15.5)
WBC: 23 10*3/uL — ABNORMAL HIGH (ref 4.0–10.5)

## 2017-02-02 LAB — LIPASE, BLOOD: Lipase: 18 U/L (ref 11–51)

## 2017-02-02 MED ORDER — IOPAMIDOL (ISOVUE-300) INJECTION 61%
100.0000 mL | Freq: Once | INTRAVENOUS | Status: AC | PRN
  Administered 2017-02-02: 100 mL via INTRAVENOUS

## 2017-02-02 MED ORDER — MORPHINE SULFATE (PF) 2 MG/ML IV SOLN
4.0000 mg | Freq: Once | INTRAVENOUS | Status: AC
  Administered 2017-02-02: 4 mg via INTRAVENOUS
  Filled 2017-02-02: qty 2

## 2017-02-02 MED ORDER — MORPHINE SULFATE (PF) 2 MG/ML IV SOLN
4.0000 mg | Freq: Once | INTRAVENOUS | Status: AC
  Administered 2017-02-03: 4 mg via INTRAVENOUS
  Filled 2017-02-02: qty 2

## 2017-02-02 MED ORDER — IOPAMIDOL (ISOVUE-300) INJECTION 61%
INTRAVENOUS | Status: AC
  Administered 2017-02-03
  Filled 2017-02-02: qty 100

## 2017-02-02 NOTE — ED Provider Notes (Signed)
[facility] DEPT Provider Note   CSN: [id number] Arrival date & time: 02/02/17  1354     History   Chief Complaint Chief Complaint  Patient presents with  . Abdominal Pain    HPI [name] is a 61 y.o. female.  Patient with past medical history of Castleman's disease, chronic kidney disease, hypertension, hypothyroidism, presenting with acute onset of right upper quadrant abdominal pain that began this morning. Patient states pain is crampy and constant without modifying factors. Reports associated decrease in appetite. She states the pain feels similar to when she had a flareup of her Castleman's disease. Denies nausea/vomiting, diarrhea/constipation, chest pain, shortness of breath, fever, urinary frequency, dysuria, heartburn. She reports her last BM was this morning and normal.  Patient is followed by Dr. Julien Nordmann with [facility] for her Castleman's disease. Recent CT of her chest and abdomen done in March 2018 showed percussion of her Castleman's disease, with lymphadenopathy in the left pectoral area, left axilla, as well as a left lower lobe pulmonary nodule. No new lymphadenopathy visualized in the abdomen. Per Dr. Worthy Flank note, patient to follow-up 6 months from March for a repeat CT.     x Past Medical History:  Diagnosis Date  . 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 Chronic kidney disease    failure  . Heart attack ([place]) 2007  . Heart attack ([place]) 2007  . Hypertension   . Hypothyroidism   . ITP (idiopathic thrombocytopenic purpura) 06/17/2011  . Pulmonary nodule 10/10/2016  . Retroperitoneal lymphadenopathy 2012    Patient Active Problem List   Diagnosis Date Noted  . Pulmonary nodule 10/10/2016  . 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 ITP (idiopathic thrombocytopenic purpura) 06/17/2011  . Castleman disease ([place]) 06/13/2011  . UNSPECIFIED HYPOTHYROIDISM 10/08/2010  . UNSPECIFIED DISORDER OF ADRENAL GLANDS 10/08/2010  . CAD 10/08/2010  .  CONSTIPATION 10/08/2010  . Enlarged lymph nodes 10/08/2010    Past Surgical History:  Procedure Laterality Date  . APPENDECTOMY     61 years old  . [place]    OB History    No data available       Home Medications    Prior to Admission medications   Medication Sig Start Date End Date Taking? Authorizing Provider  ALPRAZolam Duanne Moron) 0.25 MG tablet Take 0.5 mg by mouth at bedtime as needed for sleep. For sleep   Yes [provider]  ibuprofen (ADVIL,MOTRIN) 200 MG tablet Take 200 mg by mouth every 6 (six) hours as needed for headache or mild pain.    Yes [provider]  levothyroxine (SYNTHROID, LEVOTHROID) 112 MCG tablet Take 112 mcg by mouth daily before breakfast.    Yes [provider]  metoprolol succinate (TOPROL-XL) 25 MG 24 hr tablet Take 12.5 mg by mouth daily.    Yes [provider]  predniSONE (DELTASONE) 20 MG tablet 3 tabs po daily x 7 days 01/27/17  Yes Deno Etienne, DO  valACYclovir (VALTREX) 1000 MG tablet Take 1 tablet (1,000 mg total) by mouth 3 (three) times daily. 01/27/17 02/03/17 Yes Deno Etienne, DO    Family History Family History  Problem Relation Age of Onset  . Cancer Mother        lung  . Heart disease Father     Social History Social History  Substance Use Topics  . Smoking status: Current Every Day Smoker    Packs/day: 0.50    Years: 25.00    Types: Cigarettes  . Smokeless  tobacco: Current User     Comment: vapor cig sometimes  . Alcohol use Yes     Allergies   Cefdinir; Levofloxacin; and Vicodin [hydrocodone-acetaminophen]   Review of Systems Review of Systems  Constitutional: Positive for appetite change. Negative for fever.  HENT: Negative for trouble swallowing.   Eyes: Negative for visual disturbance.  Respiratory: Negative for cough and shortness of breath.   Cardiovascular: Negative for chest pain.  Gastrointestinal: Positive for abdominal pain (RUQ). Negative for blood in  stool, constipation, diarrhea, nausea and vomiting.  Genitourinary: Negative for dysuria, frequency, vaginal bleeding and vaginal discharge.  Musculoskeletal: Negative for back pain.  Skin: Negative for color change.  Neurological: Negative for headaches.     Physical Exam Updated Vital Signs BP (!) 146/73 (BP Location: Left Arm)   Pulse 66   Temp (!) 97.5 F (36.4 C) (Oral)   Resp 18   SpO2 99%   Physical Exam  Constitutional: She appears well-developed and well-nourished.  HENT:  Head: Normocephalic and atraumatic.  Mouth/Throat: Oropharynx is clear and moist.  Eyes: Conjunctivae and EOM are normal. Pupils are equal, round, and reactive to light.  Cardiovascular: Normal rate, regular rhythm, normal heart sounds and intact distal pulses.   Pulmonary/Chest: Effort normal and breath sounds normal. No respiratory distress.  Abdominal: Soft. Bowel sounds are normal. She exhibits no distension. There is tenderness in the right upper quadrant and epigastric area. There is no rigidity, no rebound, no guarding, no CVA tenderness and negative Murphy's sign.  Neurological: She is alert.  Skin: Skin is warm.  Psychiatric: She has a normal mood and affect. Her behavior is normal.  Nursing note and vitals reviewed.    ED Treatments / Results  Labs (all labs ordered are listed, but only abnormal results are displayed) Labs Reviewed  COMPREHENSIVE METABOLIC PANEL - Abnormal; Notable for the following:       Result Value   Glucose, Bld 130 (*)    ALT 13 (*)    All other components within normal limits  CBC - Abnormal; Notable for the following:    WBC 23.0 (*)    Hemoglobin 15.1 (*)    All other components within normal limits  URINALYSIS, ROUTINE W REFLEX MICROSCOPIC - Abnormal; Notable for the following:    Leukocytes, UA TRACE (*)    Squamous Epithelial / LPF 0-5 (*)    All other components within normal limits  CULTURE, BLOOD (ROUTINE X 2)  CULTURE, BLOOD (ROUTINE X 2)    LIPASE, BLOOD  DIFFERENTIAL    EKG  EKG Interpretation None       Radiology Ct Abdomen Pelvis W Contrast  Result Date: 02/02/2017 CLINICAL DATA:  61 year old female with acute right abdominal pain today. History of Castleman's disease. EXAM: CT ABDOMEN AND PELVIS WITH CONTRAST TECHNIQUE: Multidetector CT imaging of the abdomen and pelvis was performed using the standard protocol following bolus administration of intravenous contrast. CONTRAST:  171mL ISOVUE-300 IOPAMIDOL (ISOVUE-300) INJECTION 61% COMPARISON:  10/06/2016 and prior CTs FINDINGS: Lower chest: No acute abnormality Hepatobiliary: The liver is unremarkable. Cholelithiasis identified without CT evidence of acute cholecystitis. No biliary dilatation. Pancreas: Unremarkable Spleen: Unremarkable Adrenals/Urinary Tract: There is mild stranding/ inflammation adjacent to the right adrenal gland which is otherwise unremarkable and unchanged in appearance since prior studies. The kidneys, left adrenal gland and bladder are are unremarkable. Stomach/Bowel: Stomach is within normal limits. No evidence of bowel wall thickening, distention, or inflammatory changes. Colonic diverticulosis noted without evidence of diverticulitis. Vascular/Lymphatic: Aortic  atherosclerosis. No enlarged abdominal or pelvic lymph nodes. Unchanged small retroperitoneal lymph nodes are noted, the largest measuring 10 x 13 mm in the left periaortic region (image 35). No new or enlarging lymph nodes are identified. Reproductive: Uterus and bilateral adnexa are unremarkable. Other: No free fluid, focal collection or pneumoperitoneum. Musculoskeletal: No acute or significant osseous findings. IMPRESSION: New mild inflammation/stranding adjacent to the right adrenal gland, which appears normal. This is of uncertain clinical significance as there is no evidence of adrenal mass, hematoma or enlargement. No other acute abnormalities within the abdomen or pelvis. Cholelithiasis  without CT evidence of acute cholecystitis. Unchanged shotty and mildly prominent retroperitoneal lymph nodes. Aortic Atherosclerosis (ICD10-I70.0). Electronically Signed   By: Margarette Canada M.D.   On: 02/02/2017 22:25   US Abdomen Limited Ruq  Result Date: 02/02/2017 CLINICAL DATA:  Right upper quadrant pain. EXAM: ULTRASOUND ABDOMEN LIMITED RIGHT UPPER QUADRANT COMPARISON:  CT of the abdomen pelvis 10/06/2016 FINDINGS: Gallbladder: Numerous layering gallbladder stones, the largest measuring 9 mm. No evidence of gallbladder wall thickening or pericholecystic fluid. Sonographic Murphy's sign was recorded is negative. Common bile duct: Diameter: 5 mm. Liver: No focal lesion identified. Within normal limits in parenchymal echogenicity. IMPRESSION: Significant cholelithiasis without sonogram evidence of acute cholecystitis. Electronically Signed   By: Fidela Salisbury M.D.   On: 02/02/2017 19:21    Procedures Procedures (including critical care time)  Medications Ordered in ED Medications  iopamidol (ISOVUE-300) 61 % injection (not administered)  morphine 2 MG/ML injection 4 mg (not administered)  piperacillin-tazobactam (ZOSYN) IVPB 3.375 g (not administered)  morphine 2 MG/ML injection 4 mg (4 mg Intravenous Given 02/02/17 1747)  morphine 2 MG/ML injection 4 mg (4 mg Intravenous Given 02/02/17 2119)  iopamidol (ISOVUE-300) 61 % injection 100 mL (100 mLs Intravenous Contrast Given 02/02/17 2201)     Initial Impression / Assessment and Plan / ED Course  I have reviewed the triage vital signs and the nursing notes.  Pertinent labs & imaging results that were available during my care of the patient were reviewed by me and considered in my medical decision making (see chart for details).  Clinical Course as of Feb 03 5  Thu Feb 02, 2017  2050 On re-eval, pt continues to have RUQ abdominal tenderness.   [JR]    Clinical Course User Index [JR] Russo, [place] N, PA-C    Pt w Past medical history  of Castleman disease presenting with acute onset of Right upper quadrant abdominal pain and tenderness. Patient with elevated white blood cell count of 23. Remaining labs unremarkable. Right upper quadrant ultrasound showing cholelithiasis without cholecystitis. CT abdomen showing inflammatory changes adjacent to right adrenal gland however normal adrenal. Surgery consultation for advice, recommends further workup of elevated white blood cell count. Discussed options with patient, recommend admission versus overnight observation for further workup. Patient discussed with and seen by Dr. Regenia Skeeter.  Care assumed by Dr. Regenia Skeeter at shift change.  Final Clinical Impressions(s) / ED Diagnoses   Final diagnoses:  RUQ abdominal pain    New Prescriptions New Prescriptions   No medications on file     Russo, [place] N, PA-C 02/03/17 0006    Sherwood Gambler, MD 02/05/17 [phone]

## 2017-02-02 NOTE — Telephone Encounter (Signed)
Spoke with patient's husband, Lanny Hurst. Per dr Julien Nordmann, she is to see a neurologist, for her bell's palsy.

## 2017-02-02 NOTE — ED Triage Notes (Signed)
Pt with hx of Castleman's disease c/o RUQ abdominal pain onset this morning, feels same as patient's Castleman's disease symtpoms. No nausea, emesis, diarrhea, SOB. Right side facial droop from diagnosed Bells Palsy.

## 2017-02-02 NOTE — ED Notes (Signed)
Patient transported to CT 

## 2017-02-02 NOTE — Telephone Encounter (Signed)
Spoke with patient;due to her not being able to get in with neurology for an urgent complaint advised her to go to the Emergency Room to be evaluated for her bells palsy symptoms. Patient verbalized understanding.

## 2017-02-03 ENCOUNTER — Other Ambulatory Visit: Payer: Self-pay | Admitting: Oncology

## 2017-02-03 ENCOUNTER — Observation Stay (HOSPITAL_COMMUNITY): Payer: Medicare HMO

## 2017-02-03 DIAGNOSIS — Z8249 Family history of ischemic heart disease and other diseases of the circulatory system: Secondary | ICD-10-CM | POA: Diagnosis not present

## 2017-02-03 DIAGNOSIS — D72829 Elevated white blood cell count, unspecified: Secondary | ICD-10-CM | POA: Diagnosis not present

## 2017-02-03 DIAGNOSIS — R1011 Right upper quadrant pain: Secondary | ICD-10-CM | POA: Diagnosis not present

## 2017-02-03 DIAGNOSIS — T380X5A Adverse effect of glucocorticoids and synthetic analogues, initial encounter: Secondary | ICD-10-CM | POA: Diagnosis present

## 2017-02-03 DIAGNOSIS — D47Z2 Castleman disease: Secondary | ICD-10-CM

## 2017-02-03 DIAGNOSIS — R933 Abnormal findings on diagnostic imaging of other parts of digestive tract: Secondary | ICD-10-CM | POA: Diagnosis not present

## 2017-02-03 DIAGNOSIS — K8020 Calculus of gallbladder without cholecystitis without obstruction: Principal | ICD-10-CM

## 2017-02-03 DIAGNOSIS — Z881 Allergy status to other antibiotic agents status: Secondary | ICD-10-CM | POA: Diagnosis not present

## 2017-02-03 DIAGNOSIS — E039 Hypothyroidism, unspecified: Secondary | ICD-10-CM

## 2017-02-03 DIAGNOSIS — G4700 Insomnia, unspecified: Secondary | ICD-10-CM | POA: Diagnosis present

## 2017-02-03 DIAGNOSIS — R69 Illness, unspecified: Secondary | ICD-10-CM | POA: Diagnosis not present

## 2017-02-03 DIAGNOSIS — Z801 Family history of malignant neoplasm of trachea, bronchus and lung: Secondary | ICD-10-CM | POA: Diagnosis not present

## 2017-02-03 DIAGNOSIS — G510 Bell's palsy: Secondary | ICD-10-CM

## 2017-02-03 DIAGNOSIS — F17210 Nicotine dependence, cigarettes, uncomplicated: Secondary | ICD-10-CM | POA: Diagnosis present

## 2017-02-03 DIAGNOSIS — Z885 Allergy status to narcotic agent status: Secondary | ICD-10-CM | POA: Diagnosis not present

## 2017-02-03 DIAGNOSIS — Y92009 Unspecified place in unspecified non-institutional (private) residence as the place of occurrence of the external cause: Secondary | ICD-10-CM | POA: Diagnosis not present

## 2017-02-03 DIAGNOSIS — D7289 Other specified disorders of white blood cells: Secondary | ICD-10-CM | POA: Diagnosis not present

## 2017-02-03 DIAGNOSIS — J9811 Atelectasis: Secondary | ICD-10-CM | POA: Diagnosis not present

## 2017-02-03 DIAGNOSIS — I252 Old myocardial infarction: Secondary | ICD-10-CM | POA: Diagnosis not present

## 2017-02-03 DIAGNOSIS — I10 Essential (primary) hypertension: Secondary | ICD-10-CM | POA: Diagnosis present

## 2017-02-03 DIAGNOSIS — D693 Immune thrombocytopenic purpura: Secondary | ICD-10-CM | POA: Diagnosis not present

## 2017-02-03 LAB — BASIC METABOLIC PANEL
ANION GAP: 8 (ref 5–15)
BUN: 18 mg/dL (ref 6–20)
CHLORIDE: 100 mmol/L — AB (ref 101–111)
CO2: 29 mmol/L (ref 22–32)
CREATININE: 0.88 mg/dL (ref 0.44–1.00)
Calcium: 9.1 mg/dL (ref 8.9–10.3)
GFR calc non Af Amer: >60 mL/min (ref >60)
Glucose, Bld: 131 mg/dL — ABNORMAL HIGH (ref 65–99)
POTASSIUM: 3.8 mmol/L (ref 3.5–5.1)
SODIUM: 137 mmol/L (ref 135–145)

## 2017-02-03 LAB — CBC
HCT: 42 % (ref 36.0–46.0)
HEMOGLOBIN: 14.1 g/dL (ref 12.0–15.0)
MCH: 30.8 pg (ref 26.0–34.0)
MCHC: 33.6 g/dL (ref 30.0–36.0)
MCV: 91.7 fL (ref 78.0–100.0)
Platelets: 228 10*3/uL (ref 150–400)
RBC: 4.58 MIL/uL (ref 3.87–5.11)
RDW: 13.4 % (ref 11.5–15.5)
WBC: 18.8 10*3/uL — ABNORMAL HIGH (ref 4.0–10.5)

## 2017-02-03 LAB — DIFFERENTIAL
BASOS ABS: 0 10*3/uL (ref 0.0–0.1)
BASOS PCT: 0 %
EOS ABS: 0.1 10*3/uL (ref 0.0–0.7)
EOS PCT: 0 %
Lymphocytes Relative: 23 %
Lymphs Abs: 4.8 10*3/uL — ABNORMAL HIGH (ref 0.7–4.0)
Monocytes Absolute: 1.6 10*3/uL — ABNORMAL HIGH (ref 0.1–1.0)
Monocytes Relative: 8 %
NEUTROS PCT: 69 %
Neutro Abs: 14 10*3/uL — ABNORMAL HIGH (ref 1.7–7.7)

## 2017-02-03 MED ORDER — ALBUTEROL SULFATE (2.5 MG/3ML) 0.083% IN NEBU: 2.5000 mg | INHALATION_SOLUTION | RESPIRATORY_TRACT | Status: DC | PRN

## 2017-02-03 MED ORDER — SODIUM CHLORIDE 0.9 % IV SOLN
INTRAVENOUS | Status: DC
  Administered 2017-02-03: 03:00:00 via INTRAVENOUS
  Administered 2017-02-04: 1000 mL via INTRAVENOUS

## 2017-02-03 MED ORDER — ONDANSETRON HCL 4 MG/2ML IJ SOLN
4.0000 mg | Freq: Four times a day (QID) | INTRAMUSCULAR | Status: DC | PRN
  Administered 2017-02-03: 4 mg via INTRAVENOUS
  Filled 2017-02-03: qty 2

## 2017-02-03 MED ORDER — PIPERACILLIN-TAZOBACTAM 3.375 G IVPB 30 MIN
3.3750 g | Freq: Once | INTRAVENOUS | Status: AC
  Administered 2017-02-03: 3.375 g via INTRAVENOUS
  Filled 2017-02-03: qty 50

## 2017-02-03 MED ORDER — LEVOTHYROXINE SODIUM 112 MCG PO TABS
112.0000 ug | ORAL_TABLET | Freq: Every day | ORAL | Status: DC
  Administered 2017-02-04: 112 ug via ORAL
  Filled 2017-02-03: qty 1

## 2017-02-03 MED ORDER — METOPROLOL SUCCINATE ER 25 MG PO TB24
12.5000 mg | ORAL_TABLET | Freq: Every day | ORAL | Status: DC
  Administered 2017-02-04: 12.5 mg via ORAL
  Filled 2017-02-03: qty 1

## 2017-02-03 MED ORDER — TECHNETIUM TC 99M MEBROFENIN IV KIT
4.9700 | PACK | Freq: Once | INTRAVENOUS | Status: AC | PRN
  Administered 2017-02-03: 4.97 via INTRAVENOUS

## 2017-02-03 MED ORDER — PANTOPRAZOLE SODIUM 40 MG PO TBEC: 40.0000 mg | DELAYED_RELEASE_TABLET | Freq: Every day | ORAL | Status: DC

## 2017-02-03 MED ORDER — PREDNISONE 20 MG PO TABS: 60.0000 mg | ORAL_TABLET | Freq: Once | ORAL | Status: DC

## 2017-02-03 MED ORDER — VALACYCLOVIR HCL 500 MG PO TABS: 1000.0000 mg | ORAL_TABLET | Freq: Three times a day (TID) | ORAL | Status: DC

## 2017-02-03 MED ORDER — ACETAMINOPHEN 325 MG PO TABS: 650.0000 mg | ORAL_TABLET | Freq: Four times a day (QID) | ORAL | Status: DC | PRN

## 2017-02-03 MED ORDER — PIPERACILLIN-TAZOBACTAM 3.375 G IVPB
3.3750 g | Freq: Three times a day (TID) | INTRAVENOUS | Status: DC
  Administered 2017-02-03 – 2017-02-04 (×4): 3.375 g via INTRAVENOUS
  Filled 2017-02-03 (×4): qty 50

## 2017-02-03 MED ORDER — ENOXAPARIN SODIUM 40 MG/0.4ML ~~LOC~~ SOLN
40.0000 mg | SUBCUTANEOUS | Status: DC
  Administered 2017-02-03: 40 mg via SUBCUTANEOUS
  Filled 2017-02-03: qty 0.4

## 2017-02-03 MED ORDER — ONDANSETRON HCL 4 MG PO TABS: 4.0000 mg | ORAL_TABLET | Freq: Four times a day (QID) | ORAL | Status: DC | PRN

## 2017-02-03 MED ORDER — ALPRAZOLAM 0.5 MG PO TABS: 0.5000 mg | ORAL_TABLET | Freq: Every evening | ORAL | Status: DC | PRN

## 2017-02-03 MED ORDER — MORPHINE SULFATE (PF) 2 MG/ML IV SOLN
2.0000 mg | INTRAVENOUS | Status: DC | PRN
  Administered 2017-02-03 (×2): 2 mg via INTRAVENOUS
  Filled 2017-02-03 (×2): qty 1

## 2017-02-03 MED ORDER — ACETAMINOPHEN 650 MG RE SUPP: 650.0000 mg | Freq: Four times a day (QID) | RECTAL | Status: DC | PRN

## 2017-02-03 MED ORDER — PANTOPRAZOLE SODIUM 40 MG IV SOLR
40.0000 mg | Freq: Two times a day (BID) | INTRAVENOUS | Status: DC
  Administered 2017-02-03 – 2017-02-04 (×3): 40 mg via INTRAVENOUS
  Filled 2017-02-03 (×3): qty 40

## 2017-02-03 NOTE — Consult Note (Signed)
[facility] Surgery Consult Note  [name] [birth year]  [id number].    Requesting MD: Regalado Chief Complaint/Reason for Consult: Abdominal pain  HPI:  [name] is a 61yo female PMH Castleman's disease, HTN, and ITP, who was admitted to [facility] 02/02/17 with RUQ pain. She was admitted a week ago with symptoms of stroke which was felt to be Bell's palsy. Yesterday she developed RUQ pain with nausea. HIDA pending  Hospital workup: - CT scan showed cholelithiasis without evidence of acute cholecystitis - lipase and LFTs WNL - WBC 23 on arrival, now trending down - empirically started on zosyn - HIDA scan pending  PMH significant for Castleman's disease, ITP , HTN, Hypothyroidism Abdominal surgical history: c section, appendectomy Current every day smoker Employment:   ROS: Review of Systems  Constitutional: Negative.   HENT: Negative.   Eyes: Negative.   Respiratory: Negative.   Cardiovascular: Negative.   Gastrointestinal: Positive for abdominal pain, nausea and vomiting.  Genitourinary: Negative.   Musculoskeletal: Negative.   Skin: Negative.   Neurological: Positive for focal weakness.  Psychiatric/Behavioral: Negative.     Family History  Problem Relation Age of Onset  . Cancer Mother        lung  . Heart disease Father     Past Medical History:  Diagnosis Date  . Castleman's disease ([place])   . Chronic kidney disease    failure  . Heart attack ([place]) 2007  . Heart attack ([place]) 2007  . Hypertension   . Hypothyroidism   . ITP (idiopathic thrombocytopenic purpura) 06/17/2011  . Pulmonary nodule 10/10/2016  . Retroperitoneal lymphadenopathy 2012    Past Surgical History:  Procedure Laterality Date  . APPENDECTOMY     61 years old  . [place]    Social History:  reports that she has been smoking Cigarettes.  She has a 12.50 pack-year smoking history. She uses smokeless tobacco. She reports that she drinks alcohol. She reports  that she does not use drugs.  Allergies:  Allergies  Allergen Reactions  . Cefdinir Other (See Comments)    Causes vision problems  . Levofloxacin Itching  . Vicodin [Hydrocodone-Acetaminophen] Itching    Medications Prior to Admission  Medication Sig Dispense Refill  . ALPRAZolam (XANAX) 0.25 MG tablet Take 0.5 mg by mouth at bedtime as needed for sleep. For sleep    . ibuprofen (ADVIL,MOTRIN) 200 MG tablet Take 200 mg by mouth every 6 (six) hours as needed for headache or mild pain.     Marland Kitchen levothyroxine (SYNTHROID, LEVOTHROID) 112 MCG tablet Take 112 mcg by mouth daily before breakfast.     . metoprolol succinate (TOPROL-XL) 25 MG 24 hr tablet Take 12.5 mg by mouth daily.     . predniSONE (DELTASONE) 20 MG tablet 3 tabs po daily x 7 days 21 tablet 0  . valACYclovir (VALTREX) 1000 MG tablet Take 1 tablet (1,000 mg total) by mouth 3 (three) times daily. 21 tablet 0    Prior to Admission medications   Medication Sig Start Date End Date Taking? Authorizing Provider  ALPRAZolam Duanne Moron) 0.25 MG tablet Take 0.5 mg by mouth at bedtime as needed for sleep. For sleep   Yes [provider]  ibuprofen (ADVIL,MOTRIN) 200 MG tablet Take 200 mg by mouth every 6 (six) hours as needed for headache or mild pain.    Yes [provider]  levothyroxine (SYNTHROID, LEVOTHROID) 112 MCG tablet Take 112 mcg by mouth daily before breakfast.    Yes [provider]  metoprolol succinate (TOPROL-XL) 25 MG 24 hr tablet Take 12.5 mg by mouth daily.    Yes [provider]  predniSONE (DELTASONE) 20 MG tablet 3 tabs po daily x 7 days 01/27/17  Yes Floyd, Dan, DO  valACYclovir (VALTREX) 1000 MG tablet Take 1 tablet (1,000 mg total) by mouth 3 (three) times daily. 01/27/17 02/03/17 Yes Floyd, Dan, DO    Blood pressure (!) 164/64, pulse (!) 51, temperature 97.8 F (36.6 C), temperature source Oral, resp. rate 20, height 5' 3" (1.6 m), weight 176 lb 2.4 oz (79.9 kg), SpO2 95 %. Physical  Exam: Physical Exam  Constitutional: She is oriented to person, place, and time and well-developed, well-nourished, and in no distress. No distress.  HENT:  Head: Normocephalic and atraumatic.  Mouth/Throat: No oropharyngeal exudate.  There is some facial droop on right  Eyes: Conjunctivae and EOM are normal. Pupils are equal, round, and reactive to light.  Neck: Normal range of motion. Neck supple.  Cardiovascular: Normal rate, regular rhythm and normal heart sounds.   Pulmonary/Chest: Effort normal and breath sounds normal. No stridor. No respiratory distress.  Abdominal: Soft. Bowel sounds are normal. There is no tenderness.  Musculoskeletal: Normal range of motion. She exhibits no edema or tenderness.  Neurological: She is alert and oriented to person, place, and time. Coordination normal.  Facial droop  Skin: Skin is warm and dry. No rash noted. She is not diaphoretic.  Psychiatric: Memory, affect and judgment normal.     Results for orders placed or performed during the hospital encounter of 02/02/17 (from the past 48 hour(s))  Lipase, blood     Status: None   Collection Time: 02/02/17  3:16 PM  Result Value Ref Range   Lipase 18 11 - 51 U/L  Comprehensive metabolic panel     Status: Abnormal   Collection Time: 02/02/17  3:16 PM  Result Value Ref Range   Sodium 139 135 - 145 mmol/L   Potassium 4.7 3.5 - 5.1 mmol/L   Chloride 103 101 - 111 mmol/L   CO2 27 22 - 32 mmol/L   Glucose, Bld 130 (H) 65 - 99 mg/dL   BUN 17 6 - 20 mg/dL   Creatinine, Ser 0.92 0.44 - 1.00 mg/dL   Calcium 9.4 8.9 - 10.3 mg/dL   Total Protein 7.6 6.5 - 8.1 g/dL   Albumin 4.7 3.5 - 5.0 g/dL   AST 15 15 - 41 U/L   ALT 13 (L) 14 - 54 U/L   Alkaline Phosphatase 94 38 - 126 U/L   Total Bilirubin 0.4 0.3 - 1.2 mg/dL   GFR calc non Af Amer >60 >60 mL/min   GFR calc Af Amer >60 >60 mL/min    Comment: (NOTE) The eGFR has been calculated using the CKD EPI equation. This calculation has not been validated  in all clinical situations. eGFR's persistently <60 mL/min signify possible Chronic Kidney Disease.    Anion gap 9 5 - 15  CBC     Status: Abnormal   Collection Time: 02/02/17  3:16 PM  Result Value Ref Range   WBC 23.0 (H) 4.0 - 10.5 K/uL   RBC 4.78 3.87 - 5.11 MIL/uL   Hemoglobin 15.1 (H) 12.0 - 15.0 g/dL   HCT 43.8 36.0 - 46.0 %   MCV 91.6 78.0 - 100.0 fL   MCH 31.6 26.0 - 34.0 pg   MCHC 34.5 30.0 - 36.0 g/dL   RDW 13.2 11.5 - 15.5 %     Platelets 233 150 - 400 K/uL  Urinalysis, Routine w reflex microscopic     Status: Abnormal   Collection Time: 02/02/17  3:16 PM  Result Value Ref Range   Color, Urine YELLOW YELLOW   APPearance CLEAR CLEAR   Specific Gravity, Urine 1.015 1.005 - 1.030   pH 5.0 5.0 - 8.0   Glucose, UA NEGATIVE NEGATIVE mg/dL   Hgb urine dipstick NEGATIVE NEGATIVE   Bilirubin Urine NEGATIVE NEGATIVE   Ketones, ur NEGATIVE NEGATIVE mg/dL   Protein, ur NEGATIVE NEGATIVE mg/dL   Nitrite NEGATIVE NEGATIVE   Leukocytes, UA TRACE (A) NEGATIVE   RBC / HPF 0-5 0 - 5 RBC/hpf   WBC, UA 0-5 0 - 5 WBC/hpf   Bacteria, UA NONE SEEN NONE SEEN   Squamous Epithelial / LPF 0-5 (A) NONE SEEN   Mucous PRESENT   Differential     Status: Abnormal   Collection Time: 02/02/17  3:16 PM  Result Value Ref Range   Neutrophils Relative % 69 %   Neutro Abs 14.0 (H) 1.7 - 7.7 K/uL   Lymphocytes Relative 23 %   Lymphs Abs 4.8 (H) 0.7 - 4.0 K/uL   Monocytes Relative 8 %   Monocytes Absolute 1.6 (H) 0.1 - 1.0 K/uL   Eosinophils Relative 0 %   Eosinophils Absolute 0.1 0.0 - 0.7 K/uL   Basophils Relative 0 %   Basophils Absolute 0.0 0.0 - 0.1 K/uL  CBC     Status: Abnormal   Collection Time: 02/03/17  3:41 AM  Result Value Ref Range   WBC 18.8 (H) 4.0 - 10.5 K/uL   RBC 4.58 3.87 - 5.11 MIL/uL   Hemoglobin 14.1 12.0 - 15.0 g/dL   HCT 42.0 36.0 - 46.0 %   MCV 91.7 78.0 - 100.0 fL   MCH 30.8 26.0 - 34.0 pg   MCHC 33.6 30.0 - 36.0 g/dL   RDW 13.4 11.5 - 15.5 %   Platelets 228 150  - 400 K/uL  Basic metabolic panel     Status: Abnormal   Collection Time: 02/03/17  3:41 AM  Result Value Ref Range   Sodium 137 135 - 145 mmol/L   Potassium 3.8 3.5 - 5.1 mmol/L    Comment: DELTA CHECK NOTED   Chloride 100 (L) 101 - 111 mmol/L   CO2 29 22 - 32 mmol/L   Glucose, Bld 131 (H) 65 - 99 mg/dL   BUN 18 6 - 20 mg/dL   Creatinine, Ser 0.88 0.44 - 1.00 mg/dL   Calcium 9.1 8.9 - 10.3 mg/dL   GFR calc non Af Amer >60 >60 mL/min   GFR calc Af Amer >60 >60 mL/min    Comment: (NOTE) The eGFR has been calculated using the CKD EPI equation. This calculation has not been validated in all clinical situations. eGFR's persistently <60 mL/min signify possible Chronic Kidney Disease.    Anion gap 8 5 - 15   Dg Chest 2 View  Result Date: 02/03/2017 CLINICAL DATA:  Right lower quadrant pain EXAM: CHEST  2 VIEW COMPARISON:  10/06/2016 FINDINGS: Cardiac shadow is within normal limits. The lungs are well aerated bilaterally. Mild left basilar atelectasis is noted. No bony abnormality is seen. IMPRESSION: Mild left basilar atelectasis. Electronically Signed   By: Inez Catalina M.D.   On: 02/03/2017 08:12   Ct Abdomen Pelvis W Contrast  Result Date: 02/02/2017 CLINICAL DATA:  61 year old female with acute right abdominal pain today. History of Castleman's disease. EXAM: CT ABDOMEN AND PELVIS  WITH CONTRAST TECHNIQUE: Multidetector CT imaging of the abdomen and pelvis was performed using the standard protocol following bolus administration of intravenous contrast. CONTRAST:  100mL ISOVUE-300 IOPAMIDOL (ISOVUE-300) INJECTION 61% COMPARISON:  10/06/2016 and prior CTs FINDINGS: Lower chest: No acute abnormality Hepatobiliary: The liver is unremarkable. Cholelithiasis identified without CT evidence of acute cholecystitis. No biliary dilatation. Pancreas: Unremarkable Spleen: Unremarkable Adrenals/Urinary Tract: There is mild stranding/ inflammation adjacent to the right adrenal gland which is otherwise  unremarkable and unchanged in appearance since prior studies. The kidneys, left adrenal gland and bladder are are unremarkable. Stomach/Bowel: Stomach is within normal limits. No evidence of bowel wall thickening, distention, or inflammatory changes. Colonic diverticulosis noted without evidence of diverticulitis. Vascular/Lymphatic: Aortic atherosclerosis. No enlarged abdominal or pelvic lymph nodes. Unchanged small retroperitoneal lymph nodes are noted, the largest measuring 10 x 13 mm in the left periaortic region (image 35). No new or enlarging lymph nodes are identified. Reproductive: Uterus and bilateral adnexa are unremarkable. Other: No free fluid, focal collection or pneumoperitoneum. Musculoskeletal: No acute or significant osseous findings. IMPRESSION: New mild inflammation/stranding adjacent to the right adrenal gland, which appears normal. This is of uncertain clinical significance as there is no evidence of adrenal mass, hematoma or enlargement. No other acute abnormalities within the abdomen or pelvis. Cholelithiasis without CT evidence of acute cholecystitis. Unchanged shotty and mildly prominent retroperitoneal lymph nodes. Aortic Atherosclerosis (ICD10-I70.0). Electronically Signed   By: Jeffrey  Hu M.D.   On: 02/02/2017 22:25   Us Abdomen Limited Ruq  Result Date: 02/02/2017 CLINICAL DATA:  Right upper quadrant pain. EXAM: ULTRASOUND ABDOMEN LIMITED RIGHT UPPER QUADRANT COMPARISON:  CT of the abdomen pelvis 10/06/2016 FINDINGS: Gallbladder: Numerous layering gallbladder stones, the largest measuring 9 mm. No evidence of gallbladder wall thickening or pericholecystic fluid. Sonographic Murphy's sign was recorded is negative. Common bile duct: Diameter: 5 mm. Liver: No focal lesion identified. Within normal limits in parenchymal echogenicity. IMPRESSION: Significant cholelithiasis without sonogram evidence of acute cholecystitis. Electronically Signed   By: Dobrinka  Dimitrova M.D.   On:  02/02/2017 19:21      Assessment/Plan Castleman's disease - followed by Dr. Mohamed ITP Lymphadenopathy consistent with angiofollicular lymphoid hyperplasia  HTN Hypothyroidism Tobacco abuse  RUQ pain, cholelithiasis - CT scan showed cholelithiasis without evidence of acute cholecystitis - lipase and LFTs WNL - WBC 23 on arrival, now trending down - empirically started on zosyn - HIDA scan pending  ID - zosyn VTE - lovenox FEN - IVF, NPO  Plan - Symptoms could be consistent with gallstones. This does not necessarily explain the elevated wbc. Will continue to follow pending HIDA scan results.  BROOKE A MILLER, PA-C [facility] 02/03/2017, 4:01 PM Pager: [phone] Consults: [phone] Mon-Fri 7:00 am-4:30 pm Sat-Sun 7:00 am-11:30 am   

## 2017-02-03 NOTE — Progress Notes (Signed)
This is a patient of Dr. Ulyses Amor. He was called and notified of consult and will be seeing the patient.   Thank you, Ellouise Newer, PA-C [facility]

## 2017-02-03 NOTE — H&P (Addendum)
History and Physical    [name] LEX:[id number] DOB: 02-18-1956 DOA: 02/02/2017  Referring MD/NP/PA: Dr. Regenia Skeeter PCP: [name], MD  Patient coming from: Home  Chief Complaint: Right upper quadrant pain  HPI: [name] is a 61 y.o. female with medical history significant of HTN, HLD, Castleman dz, ITP; who presents with complaints of right upper quadrant pain since yesterday morning around 8 a.m. Patient reports that she had just finishing her morning coffee when the symptoms started. She describes the pain is constant and wasn't able to try anything to relieve the symptoms. Patient was just seen in the emergency department 1 week ago for right-sided facial weakness and slurred speech. At that time she had negative MRI studies ultimately was diagnosed with Bell's palsy. She was discharged home with valacyclovir and prednisone. Despite taking medicines as prescribed patient states that symptoms have persisted. She states that she needs to see a neurologist and does not wish to continue prednisone.    ED Course: Upon admission into the emergency department patient was seen to be afebrile heart rate is 51-85, all other vitals relatively within normal limits. Labs revealed WBC 23 and hemoglobin 15.1. Abdominal imaging studies shows signs of mild inflammation adjacent to the right adrenal gland and cholelithiasis without signs of cholecystitis. Patient was empirically given 1 dose of Zosyn for the possibility of underlying infection. TR[facility] called to admit.   Review of Systems: Review of Systems  Constitutional: Negative for malaise/fatigue and weight loss.  HENT: Negative for ear discharge and tinnitus.   Eyes: Negative for photophobia and discharge.  Respiratory: Negative for sputum production and shortness of breath.   Cardiovascular: Negative for orthopnea and claudication.  Gastrointestinal: Positive for abdominal pain.  Genitourinary: Negative for frequency and urgency.    Musculoskeletal: Negative for back pain and neck pain.  Skin: Negative for itching and rash.  Neurological: Positive for speech change and focal weakness.  Endo/Heme/Allergies: Negative for polydipsia.  Psychiatric/Behavioral: Negative for memory loss. The patient does not have insomnia.   All other systems reviewed and are negative.   Past Medical History:  Diagnosis Date  . Castleman's disease ([place])   . Chronic kidney disease    failure  . Heart attack ([place]) 2007  . Heart attack ([place]) 2007  . Hypertension   . Hypothyroidism   . ITP (idiopathic thrombocytopenic purpura) 06/17/2011  . Pulmonary nodule 10/10/2016  . Retroperitoneal lymphadenopathy 2012    Past Surgical History:  Procedure Laterality Date  . APPENDECTOMY     61 years old  . [place]     reports that she has been smoking Cigarettes.  She has a 12.50 pack-year smoking history. She uses smokeless tobacco. She reports that she drinks alcohol. She reports that she does not use drugs.  Allergies  Allergen Reactions  . Cefdinir Other (See Comments)    Causes vision problems  . Levofloxacin Itching  . Vicodin [Hydrocodone-Acetaminophen] Itching    Family History  Problem Relation Age of Onset  . Cancer Mother        lung  . Heart disease Father     Prior to Admission medications   Medication Sig Start Date End Date Taking? Authorizing Provider  ALPRAZolam Duanne Moron) 0.25 MG tablet Take 0.5 mg by mouth at bedtime as needed for sleep. For sleep   Yes [provider]  ibuprofen (ADVIL,MOTRIN) 200 MG tablet Take 200 mg by mouth every 6 (six) hours as needed for headache or mild pain.  Yes [provider]  levothyroxine (SYNTHROID, LEVOTHROID) 112 MCG tablet Take 112 mcg by mouth daily before breakfast.    Yes [provider]  metoprolol succinate (TOPROL-XL) 25 MG 24 hr tablet Take 12.5 mg by mouth daily.    Yes [provider]  predniSONE (DELTASONE) 20 MG  tablet 3 tabs po daily x 7 days 01/27/17  Yes Deno Etienne, DO  valACYclovir (VALTREX) 1000 MG tablet Take 1 tablet (1,000 mg total) by mouth 3 (three) times daily. 01/27/17 02/03/17 Yes Deno Etienne, DO    Physical Exam:  Constitutional:Older female who appears to be in moderate discomfort  Vitals:   02/02/17 1601 02/02/17 1759 02/02/17 1921 02/02/17 2121  BP: (!) 173/85 (!) 177/87 135/76 (!) 146/73  Pulse: 69 70 85 66  Resp: 20 18 18 18   Temp:   (!) 97.5 F (36.4 C)   TempSrc:   Oral   SpO2: 100% 100% 98% 99%   Eyes: PERRL, lids and conjunctivae normal ENMT: Mucous membranes are moist. Posterior pharynx clear of any exudate or lesions. Normal dentition.  Neck: normal, supple, no masses, no thyromegaly Respiratory: clear to auscultation bilaterally, no wheezing, no crackles. Normal respiratory effort. No accessory muscle use.  Cardiovascular: Regular rate and rhythm, no murmurs / rubs / gallops. No extremity edema. 2+ pedal pulses. No carotid bruits.  Abdomen: Right upper quadrant tenderness present.  no masses palpated. No hepatosplenomegaly. Bowel sounds positive.  Musculoskeletal: no clubbing / cyanosis. No joint deformity upper and lower extremities. Good ROM, no contractures. Normal muscle tone.  Skin: no rashes, lesions, ulcers. No induration Neurologic: CN 2-12 grossly intact. Sensation intact, DTR normal. Right-sided facial droop with slurred speech.  Psychiatric: Normal judgment and insight. Alert and oriented x 3. Normal mood.     Labs on Admission: I have personally reviewed following labs and imaging studies  CBC:  Recent Labs Lab 01/27/17 1047 01/27/17 1058 02/02/17 1516  WBC 10.9*  --  23.0*  NEUTROABS 7.7  --  14.0*  HGB 15.8* 16.0* 15.1*  HCT 47.1* 47.0* 43.8  MCV 93.1  --  91.6  PLT 187  --  161   Basic Metabolic Panel:  Recent Labs Lab 01/27/17 1047 01/27/17 1058 02/02/17 1516  NA 138 140 139  K 4.7 4.6 4.7  CL 106 104 103  CO2 24  --  27  GLUCOSE  116* 113* 130*  BUN 12 17 17   CREATININE 0.77 0.70 0.92  CALCIUM 9.7  --  9.4   GFR: Estimated Creatinine Clearance: 65.7 mL/min (by C-G formula based on SCr of 0.92 mg/dL). Liver Function Tests:  Recent Labs Lab 01/27/17 1047 02/02/17 1516  AST 19 15  ALT 13* 13*  ALKPHOS 96 94  BILITOT 0.6 0.4  PROT 7.4 7.6  ALBUMIN 4.8 4.7    Recent Labs Lab 02/02/17 1516  LIPASE 18   No results for input(s): AMMONIA in the last 168 hours. Coagulation Profile:  Recent Labs Lab 01/27/17 1047  INR 0.98   Cardiac Enzymes: No results for input(s): CKTOTAL, CKMB, CKMBINDEX, TROPONINI in the last 168 hours. BNP (last 3 results) No results for input(s): PROBNP in the last 8760 hours. HbA1C: No results for input(s): HGBA1C in the last 72 hours. CBG: No results for input(s): GLUCAP in the last 168 hours. Lipid Profile: No results for input(s): CHOL, HDL, LDLCALC, TRIG, CHOLHDL, LDLDIRECT in the last 72 hours. Thyroid Function Tests: No results for input(s): TSH, T4TOTAL, FREET4, T3FREE, THYROIDAB in the last 72  hours. Anemia Panel: No results for input(s): VITAMINB12, FOLATE, FERRITIN, TIBC, IRON, RETICCTPCT in the last 72 hours. Urine analysis:    Component Value Date/Time   COLORURINE YELLOW 02/02/2017 [place] 02/02/2017 1516   LABSPEC 1.015 02/02/2017 1516   PHURINE 5.0 02/02/2017 1516   GLUCOSEU NEGATIVE 02/02/2017 1516   HGBUR NEGATIVE 02/02/2017 1516   BILIRUBINUR NEGATIVE 02/02/2017 1516   KETONESUR NEGATIVE 02/02/2017 1516   PROTEINUR NEGATIVE 02/02/2017 1516   UROBILINOGEN 0.2 05/29/2011 1728   NITRITE NEGATIVE 02/02/2017 1516   LEUKOCYTESUR TRACE (A) 02/02/2017 1516   Sepsis Labs: No results found for this or any previous visit (from the past 240 hour(s)).   Radiological Exams on Admission: Ct Abdomen Pelvis W Contrast  Result Date: 02/02/2017 CLINICAL DATA:  61 year old female with acute right abdominal pain today. History of Castleman's  disease. EXAM: CT ABDOMEN AND PELVIS WITH CONTRAST TECHNIQUE: Multidetector CT imaging of the abdomen and pelvis was performed using the standard protocol following bolus administration of intravenous contrast. CONTRAST:  111mL ISOVUE-300 IOPAMIDOL (ISOVUE-300) INJECTION 61% COMPARISON:  10/06/2016 and prior CTs FINDINGS: Lower chest: No acute abnormality Hepatobiliary: The liver is unremarkable. Cholelithiasis identified without CT evidence of acute cholecystitis. No biliary dilatation. Pancreas: Unremarkable Spleen: Unremarkable Adrenals/Urinary Tract: There is mild stranding/ inflammation adjacent to the right adrenal gland which is otherwise unremarkable and unchanged in appearance since prior studies. The kidneys, left adrenal gland and bladder are are unremarkable. Stomach/Bowel: Stomach is within normal limits. No evidence of bowel wall thickening, distention, or inflammatory changes. Colonic diverticulosis noted without evidence of diverticulitis. Vascular/Lymphatic: Aortic atherosclerosis. No enlarged abdominal or pelvic lymph nodes. Unchanged small retroperitoneal lymph nodes are noted, the largest measuring 10 x 13 mm in the left periaortic region (image 35). No new or enlarging lymph nodes are identified. Reproductive: Uterus and bilateral adnexa are unremarkable. Other: No free fluid, focal collection or pneumoperitoneum. Musculoskeletal: No acute or significant osseous findings. IMPRESSION: New mild inflammation/stranding adjacent to the right adrenal gland, which appears normal. This is of uncertain clinical significance as there is no evidence of adrenal mass, hematoma or enlargement. No other acute abnormalities within the abdomen or pelvis. Cholelithiasis without CT evidence of acute cholecystitis. Unchanged shotty and mildly prominent retroperitoneal lymph nodes. Aortic Atherosclerosis (ICD10-I70.0). Electronically Signed   By: Margarette Canada M.D.   On: 02/02/2017 22:25   US Abdomen Limited  Ruq  Result Date: 02/02/2017 CLINICAL DATA:  Right upper quadrant pain. EXAM: ULTRASOUND ABDOMEN LIMITED RIGHT UPPER QUADRANT COMPARISON:  CT of the abdomen pelvis 10/06/2016 FINDINGS: Gallbladder: Numerous layering gallbladder stones, the largest measuring 9 mm. No evidence of gallbladder wall thickening or pericholecystic fluid. Sonographic Murphy's sign was recorded is negative. Common bile duct: Diameter: 5 mm. Liver: No focal lesion identified. Within normal limits in parenchymal echogenicity. IMPRESSION: Significant cholelithiasis without sonogram evidence of acute cholecystitis. Electronically Signed   By: Fidela Salisbury M.D.   On: 02/02/2017 19:21      Assessment/Plan Right upper quadrant abdominal pain, cholelithiasis: Patient presented with acute right upper quadrant abdominal pain. Imaging studies show cholelithiasis without signs of cholecystitis or biliary obstruction. Labs otherwise normal. Surgery was notified, but questioned need of further workup. - Admit to MedSurg bed - Clear liquid diet - Pain control with Morphine IV prn  - IV fluids NS at 75 mL per hour - Consider need of reconsult surgery if patient still with persistent pain  Leukocytosis: Acute. WBC elevated at 23. Patient with recent completion of steroid  pack. She was empirically given antibiotics of Zosyn.  - Follow up blood cultures - Reassess in a.m. to determine if antibiotics need to be continued   Bell's palsy: Patient does not want to continue complete steroids and valacyclovir treatment. She requests to be seen by a neurologist at some point.   Hypothyroidism: Last TSH 1.076 on 6/29.  - Continue levothyroxine  H/O castleman's dz  Insomnia - Continue Xanax prn  DVT prophylaxis: Lovenox Code Status: Full Family Communication:  No family present at bedside.  Disposition Plan: Likely discharge home once medically stable Consults called: none  Admission status: Observation Norval Morton MD [facility] Pager [phone]  If 7PM-7AM, please contact night-coverage [email or web address] Password [email or web address]  02/03/2017, 12:57 AM

## 2017-02-03 NOTE — Progress Notes (Signed)
Subjective: The patient is seen and examined today. 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 She is currently on observation. The patient had several incidents recently including recent evaluation for facial droop. She had MRI of the brain last week that showed no evidence of any acute abnormality but there was evidence for remote CVA.Marland Kitchen She was admitted yesterday with right upper quadrant abdominal pain and ultrasound of the abdomen showed significant cholelithiasis without sonographic evidence of acute cholecystitis. This was followed by CT scan of the abdomen and pelvis yesterday and it showed new mild inflammation/stranding adjacent to the right adrenal gland which appears normal of unknown clinical significance as it is no evidence of adrenal mass, hematoma or enlargement. There is no other acute abnormalities within the abdomen or pelvis but there was evidence for cholelithiasis without CT evidence of acute cholecystitis. There was also unchanged shotty and mildly prominent retroperitoneal lymph nodes. I was asked to see the patient today for concern about association with Castleman syndrome. The patient is feeling much better today but she continues to have the facial droop and mild right upper quadrant abdominal pain. She has no chest pain, shortness breath, cough or hemoptysis.  Objective: Vital signs in last 24 hours: Temp:  [97.7 F (36.5 C)-97.9 F (36.6 C)] 97.7 F (36.5 C) (07/06 1741) Pulse Rate:  [51-66] 54 (07/06 1741) Resp:  [18-20] 20 (07/06 1741) BP: (117-164)/(60-73) 117/60 (07/06 1741) SpO2:  [93 %-99 %] 97 % (07/06 1741) Weight:  [176 lb 2.4 oz (79.9 kg)] 176 lb 2.4 oz (79.9 kg) (07/06 0303)  Intake/Output from previous day: 07/05 0701 - 07/06 0700 In: 50 [IV Piggyback:50] Out: -  Intake/Output this shift: No intake/output data  recorded.  General appearance: alert, cooperative, fatigued and no distress Resp: clear to auscultation bilaterally Cardio: regular rate and rhythm, S1, S2 normal, no murmur, click, rub or gallop GI: soft, non-tender; bowel sounds normal; no masses,  no organomegaly Extremities: extremities normal, atraumatic, no cyanosis or edema  Lab Results:   Recent Labs  02/02/17 1516 02/03/17 0341  WBC 23.0* 18.8*  HGB 15.1* 14.1  HCT 43.8 42.0  PLT 233 228   BMET  Recent Labs  02/02/17 1516 02/03/17 0341  NA 139 137  K 4.7 3.8  CL 103 100*  CO2 27 29  GLUCOSE 130* 131*  BUN 17 18  CREATININE 0.92 0.88  CALCIUM 9.4 9.1    Studies/Results: Dg Chest 2 View  Result Date: 02/03/2017 CLINICAL DATA:  Right lower quadrant pain EXAM: CHEST  2 VIEW COMPARISON:  10/06/2016 FINDINGS: Cardiac shadow is within normal limits. The lungs are well aerated bilaterally. Mild left basilar atelectasis is noted. No bony abnormality is seen. IMPRESSION: Mild left basilar atelectasis. Electronically Signed   By: Inez Catalina M.D.   On: 02/03/2017 08:12   Ct Abdomen Pelvis W Contrast  Result Date: 02/02/2017 CLINICAL DATA:  61 year old female with acute right abdominal pain today. History of Castleman's disease. EXAM: CT ABDOMEN AND PELVIS WITH CONTRAST TECHNIQUE: Multidetector CT imaging of the abdomen and pelvis was performed using the standard protocol following bolus administration of intravenous contrast. CONTRAST:  155mL ISOVUE-300 IOPAMIDOL (ISOVUE-300) INJECTION 61% COMPARISON:  10/06/2016 and prior CTs FINDINGS: Lower chest: No acute abnormality Hepatobiliary: The liver is unremarkable. Cholelithiasis identified without CT evidence of acute cholecystitis. No biliary dilatation. Pancreas: Unremarkable Spleen: Unremarkable Adrenals/Urinary Tract:  There is mild stranding/ inflammation adjacent to the right adrenal gland which is otherwise unremarkable and unchanged in appearance since prior studies. The  kidneys, left adrenal gland and bladder are are unremarkable. Stomach/Bowel: Stomach is within normal limits. No evidence of bowel wall thickening, distention, or inflammatory changes. Colonic diverticulosis noted without evidence of diverticulitis. Vascular/Lymphatic: Aortic atherosclerosis. No enlarged abdominal or pelvic lymph nodes. Unchanged small retroperitoneal lymph nodes are noted, the largest measuring 10 x 13 mm in the left periaortic region (image 35). No new or enlarging lymph nodes are identified. Reproductive: Uterus and bilateral adnexa are unremarkable. Other: No free fluid, focal collection or pneumoperitoneum. Musculoskeletal: No acute or significant osseous findings. IMPRESSION: New mild inflammation/stranding adjacent to the right adrenal gland, which appears normal. This is of uncertain clinical significance as there is no evidence of adrenal mass, hematoma or enlargement. No other acute abnormalities within the abdomen or pelvis. Cholelithiasis without CT evidence of acute cholecystitis. Unchanged shotty and mildly prominent retroperitoneal lymph nodes. Aortic Atherosclerosis (ICD10-I70.0). Electronically Signed   By: Margarette Canada M.D.   On: 02/02/2017 22:25   Nm Hepato W/eject Fract  Result Date: 02/03/2017 CLINICAL DATA:  Nausea and vomiting with right upper quadrant pain EXAM: NUCLEAR MEDICINE HEPATOBILIARY IMAGING WITH GALLBLADDER EF TECHNIQUE: Sequential images of the abdomen were obtained out to 60 minutes following intravenous administration of radiopharmaceutical. After oral ingestion of Ensure, gallbladder ejection fraction was determined. At 60 min, normal ejection fraction is greater than 33%. RADIOPHARMACEUTICALS:  4.97 mCi Tc-69m  Choletec IV COMPARISON:  None. FINDINGS: Prompt uptake and biliary excretion of activity by the liver is seen. Gallbladder activity is visualized, consistent with patency of cystic duct. Biliary activity passes into small bowel, consistent with patent  common bile duct. Calculated gallbladder ejection fraction is 13.7%. (Normal gallbladder ejection fraction with Ensure is greater than 33%.) IMPRESSION: Normal uptake and excretion of biliary tracer is noted. Reduced gallbladder ejection fraction of 13%. Electronically Signed   By: Inez Catalina M.D.   On: 02/03/2017 16:56   US Abdomen Limited Ruq  Result Date: 02/02/2017 CLINICAL DATA:  Right upper quadrant pain. EXAM: ULTRASOUND ABDOMEN LIMITED RIGHT UPPER QUADRANT COMPARISON:  CT of the abdomen pelvis 10/06/2016 FINDINGS: Gallbladder: Numerous layering gallbladder stones, the largest measuring 9 mm. No evidence of gallbladder wall thickening or pericholecystic fluid. Sonographic Murphy's sign was recorded is negative. Common bile duct: Diameter: 5 mm. Liver: No focal lesion identified. Within normal limits in parenchymal echogenicity. IMPRESSION: Significant cholelithiasis without sonogram evidence of acute cholecystitis. Electronically Signed   By: Fidela Salisbury M.D.   On: 02/02/2017 19:21    Medications: I have reviewed the patient's current medications.  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 She presented recently with several issues including facial droop as well as right upper quadrant abdominal pain. The patient is concerned about the possibility of association with Castleman's syndrome. I explained to the patient that I don't see a clear association with Castleman's syndrome with her current presentation but this is not completely excluded tablet the disease could present with some vague symptoms. The imaging studies including CT scan of the abdomen and pelvis showed no evidence for disease progression or worsening of her lymphadenopathy  which is also and assuring signed for stability of her  disease. I would recommend for the patient to see neurology for evaluation of the facial droop as well as gastroenterology or surgery for evaluation of her right upper quadrant abdominal pain. The patient is also concerned about previous finding of 6 mm nodule in the chest that was seen in March 2018. She has a scan is scheduled to be performed in September 2018 but she would like to have her scan done earlier because of her anxiety and concern about that nodule. Her current leukocytosis is most likely secondary to treatment with steroids. I will arrange for the patient follow-up visit with me a few weeks after her discharge for reevaluation of her disease. Thank you for taking good care of Ms. Stille, I will continue to follow up the patient with you and assist in her management an as-needed basis.    LOS: 0 days    Tyronza Happe K. 02/03/2017

## 2017-02-03 NOTE — Progress Notes (Signed)
PROGRESS NOTE    [name]  WVP:[id number] DOB: [birth year] DOA: 02/02/2017 PCP: [name], MD    Brief Narrative: [name] is a 61 y.o. female with medical history significant of HTN, HLD, Castleman dz, ITP; who presents with complaints of right upper quadrant pain since yesterday morning around 8 a.m. Patient reports that she had just finishing her morning coffee when the symptoms started. She describes the pain is constant and wasn't able to try anything to relieve the symptoms. Patient was just seen in the emergency department 1 week ago for right-sided facial weakness and slurred speech. At that time she had negative MRI studies ultimately was diagnosed with Bell's palsy. She was discharged home with valacyclovir and prednisone. Despite taking medicines as prescribed patient states that symptoms have persisted. She states that she needs to see a neurologist and does not wish to continue prednisone.    ED Course: Upon admission into the emergency department patient was seen to be afebrile heart rate is 51-85, all other vitals relatively within normal limits. Labs revealed WBC 23 and hemoglobin 15.1. Abdominal imaging studies shows signs of mild inflammation adjacent to the right adrenal gland and cholelithiasis without signs of cholecystitis. Patient was empirically given 1 dose of Zosyn for the possibility of underlying infection. [facility] called to admit.   Assessment & Plan:   Principal Problem:   Right upper quadrant abdominal pain Active Problems:   Hypothyroidism   Castleman disease (HCC)   Bell's palsy   Leukocytosis   1-Right upper Quadrant abdominal pain;  Lipase normal, LFT normal.  CT abdomen cholelithiasis, inflammation near adrenal glands, non specific.  [place]; Significant cholelithiasis without sonogram evidence of acute cholecystitis. GI consulted.  HIDA scan ordered.  Surgery consulted.   2-Leukocytosis;  Chest x ray left atelectasis.  She was on  prednisone.   3-Facial Weakness; MRI with old stroke.  Neurology consulted.   4-Castleman Diseases;  Discussed with DR Julien Nordmann, would should rule out others possibility for abdominal pain other han Castleman diseases.    DVT prophylaxis: Lovenox Code Status: Full code.  Family Communication: care discussed with patient Disposition Plan: remain in the hospital.   Consultants:   Gi  Surgery   Procedures:  [place]; Significant cholelithiasis without sonogram evidence of acute cholecystitis.    Antimicrobials:   Zosyn    Subjective: Still with right upper quadrant pain.  Right facial droop, weakness  Objective: Vitals:   02/02/17 1921 02/02/17 2121 02/03/17 0230 02/03/17 0303  BP: 135/76 (!) 146/73 138/64 (!) 164/64  Pulse: 85 66 (!) 53 (!) 51  Resp: 18 18 18 20   Temp: (!) 97.5 F (36.4 C)  97.9 F (36.6 C) 97.8 F (36.6 C)  TempSrc: Oral  Oral Oral  SpO2: 98% 99% 93% 95%  Weight:    79.9 kg (176 lb 2.4 oz)  Height:    5\' 3"  (1.6 m)    Intake/Output Summary (Last 24 hours) at 02/03/17 0815 Last data filed at 02/03/17 0100  Gross per 24 hour  Intake               50 ml  Output                0 ml  Net               50 ml   Filed Weights   02/03/17 0303  Weight: 79.9 kg (176 lb 2.4 oz)    Examination:  General exam: Appears calm and comfortable  Respiratory system: Clear to auscultation. Respiratory effort normal. Cardiovascular system: S1 & S2 heard, RRR. No JVD, murmurs, rubs, gallops or clicks. No pedal edema. Gastrointestinal system: Abdomen is nondistended, soft mild RUQ tenderness Central nervous system: Alert and oriented. Right facial weakness, droop Extremities: Symmetric 5 x 5 power. Skin: No rashes, lesions or ulcers Psychiatry: Judgement and insight appear normal. Mood & affect appropriate.     Data Reviewed: I have personally reviewed following labs and imaging studies  CBC:  Recent Labs Lab 01/27/17 1047 01/27/17 1058  02/02/17 1516 02/03/17 0341  WBC 10.9*  --  23.0* 18.8*  NEUTROABS 7.7  --  14.0*  --   HGB 15.8* 16.0* 15.1* 14.1  HCT 47.1* 47.0* 43.8 42.0  MCV 93.1  --  91.6 91.7  PLT 187  --  233 170   Basic Metabolic Panel:  Recent Labs Lab 01/27/17 1047 01/27/17 1058 02/02/17 1516 02/03/17 0341  NA 138 140 139 137  K 4.7 4.6 4.7 3.8  CL 106 104 103 100*  CO2 24  --  27 29  GLUCOSE 116* 113* 130* 131*  BUN 12 17 17 18   CREATININE 0.77 0.70 0.92 0.88  CALCIUM 9.7  --  9.4 9.1   GFR: Estimated Creatinine Clearance: 68.9 mL/min (by C-G formula based on SCr of 0.88 mg/dL). Liver Function Tests:  Recent Labs Lab 01/27/17 1047 02/02/17 1516  AST 19 15  ALT 13* 13*  ALKPHOS 96 94  BILITOT 0.6 0.4  PROT 7.4 7.6  ALBUMIN 4.8 4.7    Recent Labs Lab 02/02/17 1516  LIPASE 18   No results for input(s): AMMONIA in the last 168 hours. Coagulation Profile:  Recent Labs Lab 01/27/17 1047  INR 0.98   Cardiac Enzymes: No results for input(s): CKTOTAL, CKMB, CKMBINDEX, TROPONINI in the last 168 hours. BNP (last 3 results) No results for input(s): PROBNP in the last 8760 hours. HbA1C: No results for input(s): HGBA1C in the last 72 hours. CBG: No results for input(s): GLUCAP in the last 168 hours. Lipid Profile: No results for input(s): CHOL, HDL, LDLCALC, TRIG, CHOLHDL, LDLDIRECT in the last 72 hours. Thyroid Function Tests: No results for input(s): TSH, T4TOTAL, FREET4, T3FREE, THYROIDAB in the last 72 hours. Anemia Panel: No results for input(s): VITAMINB12, FOLATE, FERRITIN, TIBC, IRON, RETICCTPCT in the last 72 hours. Sepsis Labs: No results for input(s): PROCALCITON, LATICACIDVEN in the last 168 hours.  No results found for this or any previous visit (from the past 240 hour(s)).       Radiology Studies: Dg Chest 2 View  Result Date: 02/03/2017 CLINICAL DATA:  Right lower quadrant pain EXAM: CHEST  2 VIEW COMPARISON:  10/06/2016 FINDINGS: Cardiac shadow is within  normal limits. The lungs are well aerated bilaterally. Mild left basilar atelectasis is noted. No bony abnormality is seen. IMPRESSION: Mild left basilar atelectasis. Electronically Signed   By: Inez Catalina M.D.   On: 02/03/2017 08:12   Ct Abdomen Pelvis W Contrast  Result Date: 02/02/2017 CLINICAL DATA:  61 year old female with acute right abdominal pain today. History of Castleman's disease. EXAM: CT ABDOMEN AND PELVIS WITH CONTRAST TECHNIQUE: Multidetector CT imaging of the abdomen and pelvis was performed using the standard protocol following bolus administration of intravenous contrast. CONTRAST:  113mL ISOVUE-300 IOPAMIDOL (ISOVUE-300) INJECTION 61% COMPARISON:  10/06/2016 and prior CTs FINDINGS: Lower chest: No acute abnormality Hepatobiliary: The liver is unremarkable. Cholelithiasis identified without CT evidence of acute cholecystitis. No biliary dilatation. Pancreas: Unremarkable Spleen: Unremarkable Adrenals/Urinary Tract:  There is mild stranding/ inflammation adjacent to the right adrenal gland which is otherwise unremarkable and unchanged in appearance since prior studies. The kidneys, left adrenal gland and bladder are are unremarkable. Stomach/Bowel: Stomach is within normal limits. No evidence of bowel wall thickening, distention, or inflammatory changes. Colonic diverticulosis noted without evidence of diverticulitis. Vascular/Lymphatic: Aortic atherosclerosis. No enlarged abdominal or pelvic lymph nodes. Unchanged small retroperitoneal lymph nodes are noted, the largest measuring 10 x 13 mm in the left periaortic region (image 35). No new or enlarging lymph nodes are identified. Reproductive: Uterus and bilateral adnexa are unremarkable. Other: No free fluid, focal collection or pneumoperitoneum. Musculoskeletal: No acute or significant osseous findings. IMPRESSION: New mild inflammation/stranding adjacent to the right adrenal gland, which appears normal. This is of uncertain clinical  significance as there is no evidence of adrenal mass, hematoma or enlargement. No other acute abnormalities within the abdomen or pelvis. Cholelithiasis without CT evidence of acute cholecystitis. Unchanged shotty and mildly prominent retroperitoneal lymph nodes. Aortic Atherosclerosis (ICD10-I70.0). Electronically Signed   By: Margarette Canada M.D.   On: 02/02/2017 22:25   US Abdomen Limited Ruq  Result Date: 02/02/2017 CLINICAL DATA:  Right upper quadrant pain. EXAM: ULTRASOUND ABDOMEN LIMITED RIGHT UPPER QUADRANT COMPARISON:  CT of the abdomen pelvis 10/06/2016 FINDINGS: Gallbladder: Numerous layering gallbladder stones, the largest measuring 9 mm. No evidence of gallbladder wall thickening or pericholecystic fluid. Sonographic Murphy's sign was recorded is negative. Common bile duct: Diameter: 5 mm. Liver: No focal lesion identified. Within normal limits in parenchymal echogenicity. IMPRESSION: Significant cholelithiasis without sonogram evidence of acute cholecystitis. Electronically Signed   By: Fidela Salisbury M.D.   On: 02/02/2017 19:21        Scheduled Meds: . enoxaparin (LOVENOX) injection  40 mg Subcutaneous Q24H  . levothyroxine  112 mcg Oral QAC breakfast  . metoprolol succinate  12.5 mg Oral Daily  . pantoprazole  40 mg Oral Daily   Continuous Infusions: . sodium chloride 75 mL/hr at 02/03/17 0303  . piperacillin-tazobactam (ZOSYN)  IV 3.375 g (02/03/17 0745)     LOS: 0 days    Time spent: 35 minutes.     Elmarie Shiley, MD [facility] Pager [phone]  If 7PM-7AM, please contact night-coverage [email or web address] Password [email or web address] 02/03/2017, 8:15 AM

## 2017-02-03 NOTE — Consult Note (Signed)
Reason for Consult: RUQ pain Referring Physician: [facility]  Garlene Apperson HPI: This is a 61 year old female with a PMH of Castleman's disease, HTN, and ITP who is admitted to the hospital with complaints of RUQ pain.  The pain started after drinking her coffee in the AM and it was persistent.  In the ER a CT scan was performed and it was significant for some mild right adrenal inflammation of unknown significance.  The WBC was at 25 and she was provided with one dose of Zosyn.  Cholelithiasis was found without evidence of cholecystitis or any biliary ductal dilation.  Past Medical History:  Diagnosis Date  . Castleman's disease ([place])   . Chronic kidney disease    failure  . Heart attack ([place]) 2007  . Heart attack ([place]) 2007  . Hypertension   . Hypothyroidism   . ITP (idiopathic thrombocytopenic purpura) 06/17/2011  . Pulmonary nodule 10/10/2016  . Retroperitoneal lymphadenopathy 2012    Past Surgical History:  Procedure Laterality Date  . APPENDECTOMY     61 years old  . CESAREAN SECTION  1983 1989    Family History  Problem Relation Age of Onset  . Cancer Mother        lung  . Heart disease Father     Social History:  reports that she has been smoking Cigarettes.  She has a 12.50 pack-year smoking history. She uses smokeless tobacco. She reports that she drinks alcohol. She reports that she does not use drugs.  Allergies:  Allergies  Allergen Reactions  . Cefdinir Other (See Comments)    Causes vision problems  . Levofloxacin Itching  . Vicodin [Hydrocodone-Acetaminophen] Itching    Medications:  Scheduled: . enoxaparin (LOVENOX) injection  40 mg Subcutaneous Q24H  . levothyroxine  112 mcg Oral QAC breakfast  . metoprolol succinate  12.5 mg Oral Daily  . pantoprazole (PROTONIX) IV  40 mg Intravenous Q12H   Continuous: . sodium chloride 75 mL/hr at 02/03/17 0303  . piperacillin-tazobactam (ZOSYN)  IV 3.375 g (02/03/17 0745)    Results for orders placed  or performed during the hospital encounter of 02/02/17 (from the past 24 hour(s))  Lipase, blood     Status: None   Collection Time: 02/02/17  3:16 PM  Result Value Ref Range   Lipase 18 11 - 51 U/L  Comprehensive metabolic panel     Status: Abnormal   Collection Time: 02/02/17  3:16 PM  Result Value Ref Range   Sodium 139 135 - 145 mmol/L   Potassium 4.7 3.5 - 5.1 mmol/L   Chloride 103 101 - 111 mmol/L   CO2 27 22 - 32 mmol/L   Glucose, Bld 130 (H) 65 - 99 mg/dL   BUN 17 6 - 20 mg/dL   Creatinine, Ser 0.92 0.44 - 1.00 mg/dL   Calcium 9.4 8.9 - 10.3 mg/dL   Total Protein 7.6 6.5 - 8.1 g/dL   Albumin 4.7 3.5 - 5.0 g/dL   AST 15 15 - 41 U/L   ALT 13 (L) 14 - 54 U/L   Alkaline Phosphatase 94 38 - 126 U/L   Total Bilirubin 0.4 0.3 - 1.2 mg/dL   GFR calc non Af Amer >60 >60 mL/min   GFR calc Af Amer >60 >60 mL/min   Anion gap 9 5 - 15  CBC     Status: Abnormal   Collection Time: 02/02/17  3:16 PM  Result Value Ref Range   WBC 23.0 (H) 4.0 - 10.5  K/uL   RBC 4.78 3.87 - 5.11 MIL/uL   Hemoglobin 15.1 (H) 12.0 - 15.0 g/dL   HCT 43.8 36.0 - 46.0 %   MCV 91.6 78.0 - 100.0 fL   MCH 31.6 26.0 - 34.0 pg   MCHC 34.5 30.0 - 36.0 g/dL   RDW 13.2 11.5 - 15.5 %   Platelets 233 150 - 400 K/uL  Urinalysis, Routine w reflex microscopic     Status: Abnormal   Collection Time: 02/02/17  3:16 PM  Result Value Ref Range   Color, Urine YELLOW YELLOW   APPearance CLEAR CLEAR   Specific Gravity, Urine 1.015 1.005 - 1.030   pH 5.0 5.0 - 8.0   Glucose, UA NEGATIVE NEGATIVE mg/dL   Hgb urine dipstick NEGATIVE NEGATIVE   Bilirubin Urine NEGATIVE NEGATIVE   Ketones, ur NEGATIVE NEGATIVE mg/dL   Protein, ur NEGATIVE NEGATIVE mg/dL   Nitrite NEGATIVE NEGATIVE   Leukocytes, UA TRACE (A) NEGATIVE   RBC / HPF 0-5 0 - 5 RBC/hpf   WBC, UA 0-5 0 - 5 WBC/hpf   Bacteria, UA NONE SEEN NONE SEEN   Squamous Epithelial / LPF 0-5 (A) NONE SEEN   Mucous PRESENT   Differential     Status: Abnormal   Collection  Time: 02/02/17  3:16 PM  Result Value Ref Range   Neutrophils Relative % 69 %   Neutro Abs 14.0 (H) 1.7 - 7.7 K/uL   Lymphocytes Relative 23 %   Lymphs Abs 4.8 (H) 0.7 - 4.0 K/uL   Monocytes Relative 8 %   Monocytes Absolute 1.6 (H) 0.1 - 1.0 K/uL   Eosinophils Relative 0 %   Eosinophils Absolute 0.1 0.0 - 0.7 K/uL   Basophils Relative 0 %   Basophils Absolute 0.0 0.0 - 0.1 K/uL  CBC     Status: Abnormal   Collection Time: 02/03/17  3:41 AM  Result Value Ref Range   WBC 18.8 (H) 4.0 - 10.5 K/uL   RBC 4.58 3.87 - 5.11 MIL/uL   Hemoglobin 14.1 12.0 - 15.0 g/dL   HCT 42.0 36.0 - 46.0 %   MCV 91.7 78.0 - 100.0 fL   MCH 30.8 26.0 - 34.0 pg   MCHC 33.6 30.0 - 36.0 g/dL   RDW 13.4 11.5 - 15.5 %   Platelets 228 150 - 400 K/uL  Basic metabolic panel     Status: Abnormal   Collection Time: 02/03/17  3:41 AM  Result Value Ref Range   Sodium 137 135 - 145 mmol/L   Potassium 3.8 3.5 - 5.1 mmol/L   Chloride 100 (L) 101 - 111 mmol/L   CO2 29 22 - 32 mmol/L   Glucose, Bld 131 (H) 65 - 99 mg/dL   BUN 18 6 - 20 mg/dL   Creatinine, Ser 0.88 0.44 - 1.00 mg/dL   Calcium 9.1 8.9 - 10.3 mg/dL   GFR calc non Af Amer >60 >60 mL/min   GFR calc Af Amer >60 >60 mL/min   Anion gap 8 5 - 15     Dg Chest 2 View  Result Date: 02/03/2017 CLINICAL DATA:  Right lower quadrant pain EXAM: CHEST  2 VIEW COMPARISON:  10/06/2016 FINDINGS: Cardiac shadow is within normal limits. The lungs are well aerated bilaterally. Mild left basilar atelectasis is noted. No bony abnormality is seen. IMPRESSION: Mild left basilar atelectasis. Electronically Signed   By: Inez Catalina M.D.   On: 02/03/2017 08:12   Ct Abdomen Pelvis W Contrast  Result Date: 02/02/2017  CLINICAL DATA:  61 year old female with acute right abdominal pain today. History of Castleman's disease. EXAM: CT ABDOMEN AND PELVIS WITH CONTRAST TECHNIQUE: Multidetector CT imaging of the abdomen and pelvis was performed using the standard protocol following bolus  administration of intravenous contrast. CONTRAST:  159mL ISOVUE-300 IOPAMIDOL (ISOVUE-300) INJECTION 61% COMPARISON:  10/06/2016 and prior CTs FINDINGS: Lower chest: No acute abnormality Hepatobiliary: The liver is unremarkable. Cholelithiasis identified without CT evidence of acute cholecystitis. No biliary dilatation. Pancreas: Unremarkable Spleen: Unremarkable Adrenals/Urinary Tract: There is mild stranding/ inflammation adjacent to the right adrenal gland which is otherwise unremarkable and unchanged in appearance since prior studies. The kidneys, left adrenal gland and bladder are are unremarkable. Stomach/Bowel: Stomach is within normal limits. No evidence of bowel wall thickening, distention, or inflammatory changes. Colonic diverticulosis noted without evidence of diverticulitis. Vascular/Lymphatic: Aortic atherosclerosis. No enlarged abdominal or pelvic lymph nodes. Unchanged small retroperitoneal lymph nodes are noted, the largest measuring 10 x 13 mm in the left periaortic region (image 35). No new or enlarging lymph nodes are identified. Reproductive: Uterus and bilateral adnexa are unremarkable. Other: No free fluid, focal collection or pneumoperitoneum. Musculoskeletal: No acute or significant osseous findings. IMPRESSION: New mild inflammation/stranding adjacent to the right adrenal gland, which appears normal. This is of uncertain clinical significance as there is no evidence of adrenal mass, hematoma or enlargement. No other acute abnormalities within the abdomen or pelvis. Cholelithiasis without CT evidence of acute cholecystitis. Unchanged shotty and mildly prominent retroperitoneal lymph nodes. Aortic Atherosclerosis (ICD10-I70.0). Electronically Signed   By: Margarette Canada M.D.   On: 02/02/2017 22:25   US Abdomen Limited Ruq  Result Date: 02/02/2017 CLINICAL DATA:  Right upper quadrant pain. EXAM: ULTRASOUND ABDOMEN LIMITED RIGHT UPPER QUADRANT COMPARISON:  CT of the abdomen pelvis 10/06/2016  FINDINGS: Gallbladder: Numerous layering gallbladder stones, the largest measuring 9 mm. No evidence of gallbladder wall thickening or pericholecystic fluid. Sonographic Murphy's sign was recorded is negative. Common bile duct: Diameter: 5 mm. Liver: No focal lesion identified. Within normal limits in parenchymal echogenicity. IMPRESSION: Significant cholelithiasis without sonogram evidence of acute cholecystitis. Electronically Signed   By: Fidela Salisbury M.D.   On: 02/02/2017 19:21    ROS:  As stated above in the HPI otherwise negative.  Blood pressure (!) 164/64, pulse (!) 51, temperature 97.8 F (36.6 C), temperature source Oral, resp. rate 20, height 5\' 3"  (1.6 m), weight 79.9 kg (176 lb 2.4 oz), SpO2 95 %.    PE: Gen: NAD, Alert and Oriented HEENT:  [place]/AT, EOMI Neck: Supple, no LAD Lungs: CTA Bilaterally CV: RRR without M/G/R ABM: Soft, NTND, +BS Ext: No C/C/E  Assessment/Plan: 1) Right adrenal inflammation. 2) Leukocytosis. 3) RUQ ABM pain.   Her WBC has declined to 18.8, but it is still markedly elevated.  No abnormalities with her liver enzymes and there is no overt biliary ductal dilation.  She does feel that some of her pain is reminiscent of her abdominal pain when she was diagnosed with Castleman's disease, but I doubt that Castleman's disease is the culprit.  Patients can have normal appearing gallbladders, but in reality it is truly diseased.  Plan: 1) Continue with Zosyn. 2) A surgical consultation may be beneficial. 3) No active GI intervention at this time.  Alasdair Kleve D 02/03/2017, 11:16 AM

## 2017-02-03 NOTE — Consult Note (Signed)
Neurology Consultation Reason for Consult: Facial nerve weakness Referring Physician: Tyrell Antonio, B  CC: Right facial weakness  History is obtained from: Patient  HPI: [name] is a 61 y.o. female who presented to the emergency department on 6/29 with 2 days of right face weakness. There she had an MRI of the brain which was negative and she was diagnosed with Bell's palsy. She was started on prednisone 60 mg 7 days and therefore should've finished on 7/5.  She was readmitted on 7/5 with abdominal pain.   ROS: A 14 point ROS was performed and is negative except as noted in the HPI.   Past Medical History:  Diagnosis Date  . Castleman's disease ([place])   . Chronic kidney disease    failure  . Heart attack ([place]) 2007  . Heart attack ([place]) 2007  . Hypertension   . Hypothyroidism   . ITP (idiopathic thrombocytopenic purpura) 06/17/2011  . Pulmonary nodule 10/10/2016  . Retroperitoneal lymphadenopathy 2012     Family History  Problem Relation Age of Onset  . Cancer Mother        lung  . Heart disease Father      Social History:  reports that she has been smoking Cigarettes.  She has a 12.50 pack-year smoking history. She uses smokeless tobacco. She reports that she drinks alcohol. She reports that she does not use drugs.   Exam: Current vital signs: BP 117/60 (BP Location: Left Arm)   Pulse (!) 54   Temp 97.7 F (36.5 C) (Oral)   Resp 20   Ht 5\' 3"  (1.6 m)   Wt 79.9 kg (176 lb 2.4 oz)   SpO2 97%   BMI 31.20 kg/m  Vital signs in last 24 hours: Temp:  [97.7 F (36.5 C)-97.9 F (36.6 C)] 97.7 F (36.5 C) (07/06 1741) Pulse Rate:  [51-66] 54 (07/06 1741) Resp:  [18-20] 20 (07/06 1741) BP: (117-164)/(60-73) 117/60 (07/06 1741) SpO2:  [93 %-99 %] 97 % (07/06 1741) Weight:  [79.9 kg (176 lb 2.4 oz)] 79.9 kg (176 lb 2.4 oz) (07/06 0303)   Physical Exam  Constitutional: Appears well-developed and well-nourished.  Psych: Affect appropriate to situation Eyes: No  scleral injection HENT: No OP obstrucion Head: Normocephalic.  Cardiovascular: Normal rate and regular rhythm.  Respiratory: Effort normal and breath sounds normal to anterior ascultation GI: Soft.  No distension. There is no tenderness.  Skin: WDI  Neuro: Mental Status: Patient is awake, alert, oriented to person, place, month, year, and situation. Patient is able to give a clear and coherent history. No signs of aphasia or neglect Cranial Nerves: II: Visual Fields are full. Pupils are equal, round, and reactive to light.   III,IV, VI: EOMI without ptosis or diploplia.  V: Facial sensation is symmetric to temperature VII: She has an incomplete facial palsy involving the for head as well.  VIII: hearing is intact to voice X: Uvula elevates symmetrically XI: Shoulder shrug is symmetric. XII: tongue is midline without atrophy or fasciculations.  Motor: Tone is normal. Bulk is normal. 5/5 strength was present in all four extremities.  Sensory: Sensation is symmetric to light touch and temperature in the arms and legs. Plantars: Toes are downgoing bilaterally.  Cerebellar: FNF and HKS are intact bilaterally   I have reviewed labs in epic and the results pertinent to this consultation are: CMP-unremarkable  I have reviewed the images obtained:MRI brain-negative   Impression: 61 year old with a history of Castleman's disease who presents with facial nerve palsy. I  don't see any clear evidence of parotid involvement on her brain MRI. I agree with the ER diagnosis of Bell's palsy and she has completed most of her course of steroids, in the setting of acute abdominal pain I don't feel strongly about completing this. I would expect gradual improvement over the next weeks to months.  Recommendations: 1) no further recommendations at this time, please call with further questions or concerns.  Roland Rack, MD [facility] [phone]  If 7pm- 7am, please page  neurology on call as listed in [place].

## 2017-02-03 NOTE — Progress Notes (Signed)
Pharmacy Antibiotic Note  [name] is a 61 y.o. female admitted on 02/02/2017 with intra-abdominal infection.  Pharmacy has been consulted for Zosyn dosing.  Plan: Zosyn 3.375g IV q8h (4 hour infusion).  SCr at baseline. Do not anticipate dose adjustments. Pharmacy will sign off at this time. Please re-consult if needed.  Height: 5\' 3"  (160 cm) Weight: 176 lb 2.4 oz (79.9 kg) IBW/kg (Calculated) : 52.4  Temp (24hrs), Avg:97.7 F (36.5 C), Min:97.5 F (36.4 C), Max:97.9 F (36.6 C)   Recent Labs Lab 01/27/17 1047 01/27/17 1058 02/02/17 1516 02/03/17 0341  WBC 10.9*  --  23.0* 18.8*  CREATININE 0.77 0.70 0.92 0.88    Estimated Creatinine Clearance: 68.9 mL/min (by C-G formula based on SCr of 0.88 mg/dL).    Allergies  Allergen Reactions  . Cefdinir Other (See Comments)    Causes vision problems  . Levofloxacin Itching  . Vicodin [Hydrocodone-Acetaminophen] Itching     Thank you for allowing pharmacy to be a part of this patient's care.  Hershal Coria 02/03/2017 7:02 AM

## 2017-02-04 LAB — BASIC METABOLIC PANEL
Anion gap: 7 (ref 5–15)
BUN: 17 mg/dL (ref 6–20)
CHLORIDE: 104 mmol/L (ref 101–111)
CO2: 30 mmol/L (ref 22–32)
CREATININE: 1 mg/dL (ref 0.44–1.00)
Calcium: 8.8 mg/dL — ABNORMAL LOW (ref 8.9–10.3)
GFR calc Af Amer: >60 mL/min (ref >60)
GFR calc non Af Amer: >60 mL/min (ref >60)
GLUCOSE: 87 mg/dL (ref 65–99)
Potassium: 4 mmol/L (ref 3.5–5.1)
SODIUM: 141 mmol/L (ref 135–145)

## 2017-02-04 LAB — CBC
HEMATOCRIT: 40.9 % (ref 36.0–46.0)
HEMOGLOBIN: 13.5 g/dL (ref 12.0–15.0)
MCH: 30.6 pg (ref 26.0–34.0)
MCHC: 33 g/dL (ref 30.0–36.0)
MCV: 92.7 fL (ref 78.0–100.0)
Platelets: 185 10*3/uL (ref 150–400)
RBC: 4.41 MIL/uL (ref 3.87–5.11)
RDW: 13.4 % (ref 11.5–15.5)
WBC: 12.5 10*3/uL — ABNORMAL HIGH (ref 4.0–10.5)

## 2017-02-04 NOTE — Discharge Summary (Signed)
Physician Discharge Summary  Annica Marinello BPZ:[id number] DOB: 1956/07/11 DOA: 02/02/2017  PCP: [name], MD  Admit date: 02/02/2017 Discharge date: 02/04/2017  Admitted From: Home  Disposition:  Home   Recommendations for Outpatient Follow-up:  1. Follow up with PCP in 1-2 weeks 2. Please obtain BMP/CBC in one week 3. Needs to follow with surgery for evaluation, elective cholecystectomy      Discharge Condition: Stable.  CODE STATUS: Full code.  Diet recommendation: Heart Healthy   Brief/Interim Summary:  Aleighya Mcanelly a 61 y.o.femalewith medical history significant of HTN, HLD, Castleman dz, ITP; who presents with complaints of right upper quadrant pain since yesterday morning around 8 a.m. Patient reports that she had just finishing her morning coffee when the symptoms started. She describes the pain is constant and wasn't able to try anything to relieve the symptoms. Patient was just seen in the emergency department 1 week ago for right-sided facial weakness and slurred speech. At that time she had negative MRI studies ultimately was diagnosed with Bell's palsy. She was discharged home with valacyclovir and prednisone. Despite taking medicines as prescribed patient states that symptoms have persisted. She states that she needs to see a neurologist and does not wish to continue prednisone.   ED Course:Upon admission into the emergency department patient was seen to be afebrile heart rate is 51-85, all other vitals relatively within normal limits. Labs revealed WBC 23 and hemoglobin 15.1.Abdominal imaging studies shows signs of mild inflammation adjacent to the right adrenal gland and cholelithiasis without signs of cholecystitis. Patient was empirically given 1 dose of Zosyn for the possibility of underlying infection. [facility] called to admit.   Assessment & Plan:   Principal Problem:   Right upper quadrant abdominal pain Active Problems:   Hypothyroidism   Castleman  disease (HCC)   Bell's palsy   Leukocytosis   1-Right upper Quadrant abdominal pain;  Lipase normal, LFT normal.  CT abdomen cholelithiasis, inflammation near adrenal glands, non specific.  [place]; Significant cholelithiasis without sonogram evidence of acute cholecystitis. GI consulted. GI recommended surgical evaluation.  HIDA scan no cholecystitis, low EF Surgery consulted.  Pain resolved. Patient wants to be discharge. She prefer to follow with surgery for elective cholecystectomy.   2-Leukocytosis;  Chest x ray left atelectasis.  She was on prednisone. Suspect related to steroids.  resolved.   3-Bell's palsy  MRI with old stroke.  Neurology consulted. No need to resume prednisone. Expect Bell's palsy will improved over time.   4-Castleman Diseases;  Discussed with DR Julien Nordmann, would should rule out others possibility for abdominal pain other han Castleman diseases.    Discharge Diagnoses:  Principal Problem:   Right upper quadrant abdominal pain Active Problems:   Hypothyroidism   Castleman disease (HCC)   Bell's palsy   Leukocytosis    Discharge Instructions  Discharge Instructions    Diet - low sodium heart healthy    Complete by:  As directed    Increase activity slowly    Complete by:  As directed      Allergies as of 02/04/2017      Reactions   Cefdinir Other (See Comments)   Causes vision problems   Levofloxacin Itching   Vicodin [hydrocodone-acetaminophen] Itching      Medication List    STOP taking these medications   ibuprofen 200 MG tablet Commonly known as:  ADVIL,MOTRIN   predniSONE 20 MG tablet Commonly known as:  DELTASONE   valACYclovir 1000 MG tablet Commonly known as:  VALTREX  TAKE these medications   ALPRAZolam 0.25 MG tablet Commonly known as:  XANAX Take 0.5 mg by mouth at bedtime as needed for sleep. For sleep   levothyroxine 112 MCG tablet Commonly known as:  SYNTHROID, LEVOTHROID Take 112 mcg by mouth daily before  breakfast.   metoprolol succinate 25 MG 24 hr tablet Commonly known as:  TOPROL-XL Take 12.5 mg by mouth daily.      Follow-up Information    Autumn Messing III, MD. Schedule an appointment as soon as possible for a visit in 3 week(s).   Specialty:  General Surgery Contact information: [address] [place] [address] [phone]          Allergies  Allergen Reactions  . Cefdinir Other (See Comments)    Causes vision problems  . Levofloxacin Itching  . Vicodin [Hydrocodone-Acetaminophen] Itching    Consultations:  GI   Surgery    Procedures/Studies: Dg Chest 2 View  Result Date: 02/03/2017 CLINICAL DATA:  Right lower quadrant pain EXAM: CHEST  2 VIEW COMPARISON:  10/06/2016 FINDINGS: Cardiac shadow is within normal limits. The lungs are well aerated bilaterally. Mild left basilar atelectasis is noted. No bony abnormality is seen. IMPRESSION: Mild left basilar atelectasis. Electronically Signed   By: Inez Catalina M.D.   On: 02/03/2017 08:12   Ct Head Wo Contrast  Result Date: 01/27/2017 CLINICAL DATA:  Facial droop on the right for 2 days, initial encounter EXAM: CT HEAD WITHOUT CONTRAST TECHNIQUE: Contiguous axial images were obtained from the base of the skull through the vertex without intravenous contrast. COMPARISON:  08/15/2010 FINDINGS: Brain: There are changes consistent with prior infarct in the left parietal region new from the prior exam. No acute hemorrhage, acute infarction or space-occupying mass lesion is identified. Vascular: No hyperdense vessel or unexpected calcification. Skull: Normal. Negative for fracture or focal lesion. Sinuses/Orbits: No acute finding. Other: None. IMPRESSION: Changes consistent with prior left parietal infarct. No acute abnormality noted. Electronically Signed   By: Inez Catalina M.D.   On: 01/27/2017 11:32   Mr Brain Wo Contrast  Result Date: 01/27/2017 CLINICAL DATA:  Right-sided facial droop EXAM: MRI HEAD WITHOUT  CONTRAST TECHNIQUE: Multiplanar, multiecho pulse sequences of the brain and surrounding structures were obtained without intravenous contrast. COMPARISON:  Head CT 01/27/2017 FINDINGS: Brain: The midline structures are normal. There is no focal diffusion restriction to indicate acute infarct. There is encephalomalacia within the left frontal lobe of the site of remote infarct. Parenchymal signal is otherwise normal. No intraparenchymal hematoma or chronic microhemorrhage. Brain volume is normal for age without age-advanced or lobar predominant atrophy. The dura is normal and there is no extra-axial collection. Vascular: Major intracranial arterial and venous sinus flow voids are preserved. Skull and upper cervical spine: The visualized skull base, calvarium, upper cervical spine and extracranial soft tissues are normal. Sinuses/Orbits: No fluid levels or advanced mucosal thickening. No mastoid effusion. Normal orbits. IMPRESSION: Old left frontal lobe infarct without acute intracranial abnormality. Electronically Signed   By: Ulyses Jarred M.D.   On: 01/27/2017 17:33   Ct Abdomen Pelvis W Contrast  Result Date: 02/02/2017 CLINICAL DATA:  61 year old female with acute right abdominal pain today. History of Castleman's disease. EXAM: CT ABDOMEN AND PELVIS WITH CONTRAST TECHNIQUE: Multidetector CT imaging of the abdomen and pelvis was performed using the standard protocol following bolus administration of intravenous contrast. CONTRAST:  19mL ISOVUE-300 IOPAMIDOL (ISOVUE-300) INJECTION 61% COMPARISON:  10/06/2016 and prior CTs FINDINGS: Lower chest: No acute abnormality  Hepatobiliary: The liver is unremarkable. Cholelithiasis identified without CT evidence of acute cholecystitis. No biliary dilatation. Pancreas: Unremarkable Spleen: Unremarkable Adrenals/Urinary Tract: There is mild stranding/ inflammation adjacent to the right adrenal gland which is otherwise unremarkable and unchanged in appearance since prior  studies. The kidneys, left adrenal gland and bladder are are unremarkable. Stomach/Bowel: Stomach is within normal limits. No evidence of bowel wall thickening, distention, or inflammatory changes. Colonic diverticulosis noted without evidence of diverticulitis. Vascular/Lymphatic: Aortic atherosclerosis. No enlarged abdominal or pelvic lymph nodes. Unchanged small retroperitoneal lymph nodes are noted, the largest measuring 10 x 13 mm in the left periaortic region (image 35). No new or enlarging lymph nodes are identified. Reproductive: Uterus and bilateral adnexa are unremarkable. Other: No free fluid, focal collection or pneumoperitoneum. Musculoskeletal: No acute or significant osseous findings. IMPRESSION: New mild inflammation/stranding adjacent to the right adrenal gland, which appears normal. This is of uncertain clinical significance as there is no evidence of adrenal mass, hematoma or enlargement. No other acute abnormalities within the abdomen or pelvis. Cholelithiasis without CT evidence of acute cholecystitis. Unchanged shotty and mildly prominent retroperitoneal lymph nodes. Aortic Atherosclerosis (ICD10-I70.0). Electronically Signed   By: Margarette Canada M.D.   On: 02/02/2017 22:25   Nm Hepato W/eject Fract  Result Date: 02/03/2017 CLINICAL DATA:  Nausea and vomiting with right upper quadrant pain EXAM: NUCLEAR MEDICINE HEPATOBILIARY IMAGING WITH GALLBLADDER EF TECHNIQUE: Sequential images of the abdomen were obtained out to 60 minutes following intravenous administration of radiopharmaceutical. After oral ingestion of Ensure, gallbladder ejection fraction was determined. At 60 min, normal ejection fraction is greater than 33%. RADIOPHARMACEUTICALS:  4.97 mCi Tc-45m  Choletec IV COMPARISON:  None. FINDINGS: Prompt uptake and biliary excretion of activity by the liver is seen. Gallbladder activity is visualized, consistent with patency of cystic duct. Biliary activity passes into small bowel, consistent  with patent common bile duct. Calculated gallbladder ejection fraction is 13.7%. (Normal gallbladder ejection fraction with Ensure is greater than 33%.) IMPRESSION: Normal uptake and excretion of biliary tracer is noted. Reduced gallbladder ejection fraction of 13%. Electronically Signed   By: Inez Catalina M.D.   On: 02/03/2017 16:56   US Abdomen Limited Ruq  Result Date: 02/02/2017 CLINICAL DATA:  Right upper quadrant pain. EXAM: ULTRASOUND ABDOMEN LIMITED RIGHT UPPER QUADRANT COMPARISON:  CT of the abdomen pelvis 10/06/2016 FINDINGS: Gallbladder: Numerous layering gallbladder stones, the largest measuring 9 mm. No evidence of gallbladder wall thickening or pericholecystic fluid. Sonographic Murphy's sign was recorded is negative. Common bile duct: Diameter: 5 mm. Liver: No focal lesion identified. Within normal limits in parenchymal echogenicity. IMPRESSION: Significant cholelithiasis without sonogram evidence of acute cholecystitis. Electronically Signed   By: Fidela Salisbury M.D.   On: 02/02/2017 19:21       Subjective: Feeling better, no pain, wants to go home   Discharge Exam: Vitals:   02/03/17 2100 02/04/17 0549  BP: 122/66 122/76  Pulse: (!) 57 (!) 54  Resp: 20 18  Temp: (!) 97.5 F (36.4 C) 97.7 F (36.5 C)   Vitals:   02/03/17 0303 02/03/17 1741 02/03/17 2100 02/04/17 0549  BP: (!) 164/64 117/60 122/66 122/76  Pulse: (!) 51 (!) 54 (!) 57 (!) 54  Resp: 20 20 20 18   Temp: 97.8 F (36.6 C) 97.7 F (36.5 C) (!) 97.5 F (36.4 C) 97.7 F (36.5 C)  TempSrc: Oral Oral Oral Oral  SpO2: 95% 97% 98% 98%  Weight: 79.9 kg (176 lb 2.4 oz)     Height: 5'  3" (1.6 m)       General: Pt is alert, awake, not in acute distress Cardiovascular: RRR, S1/S2 +, no rubs, no gallops Respiratory: CTA bilaterally, no wheezing, no rhonchi Abdominal: Soft, NT, ND, bowel sounds + Extremities: no edema, no cyanosis    The results of significant diagnostics from this hospitalization  (including imaging, microbiology, ancillary and laboratory) are listed below for reference.     Microbiology: No results found for this or any previous visit (from the past 240 hour(s)).   Labs: BNP (last 3 results) No results for input(s): BNP in the last 8760 hours. Basic Metabolic Panel:  Recent Labs Lab 02/02/17 1516 02/03/17 0341 02/04/17 0726  NA 139 137 141  K 4.7 3.8 4.0  CL 103 100* 104  CO2 27 29 30   GLUCOSE 130* 131* 87  BUN 17 18 17   CREATININE 0.92 0.88 1.00  CALCIUM 9.4 9.1 8.8*   Liver Function Tests:  Recent Labs Lab 02/02/17 1516  AST 15  ALT 13*  ALKPHOS 94  BILITOT 0.4  PROT 7.6  ALBUMIN 4.7    Recent Labs Lab 02/02/17 1516  LIPASE 18   No results for input(s): AMMONIA in the last 168 hours. CBC:  Recent Labs Lab 02/02/17 1516 02/03/17 0341 02/04/17 0726  WBC 23.0* 18.8* 12.5*  NEUTROABS 14.0*  --   --   HGB 15.1* 14.1 13.5  HCT 43.8 42.0 40.9  MCV 91.6 91.7 92.7  PLT 233 228 185   Cardiac Enzymes: No results for input(s): CKTOTAL, CKMB, CKMBINDEX, TROPONINI in the last 168 hours. BNP: Invalid input(s): POCBNP CBG: No results for input(s): GLUCAP in the last 168 hours. D-Dimer No results for input(s): DDIMER in the last 72 hours. Hgb A1c No results for input(s): HGBA1C in the last 72 hours. Lipid Profile No results for input(s): CHOL, HDL, LDLCALC, TRIG, CHOLHDL, LDLDIRECT in the last 72 hours. Thyroid function studies No results for input(s): TSH, T4TOTAL, T3FREE, THYROIDAB in the last 72 hours.  Invalid input(s): FREET3 Anemia work up No results for input(s): VITAMINB12, FOLATE, FERRITIN, TIBC, IRON, RETICCTPCT in the last 72 hours. Urinalysis    Component Value Date/Time   COLORURINE YELLOW 02/02/2017 1516   APPEARANCEUR CLEAR 02/02/2017 1516   LABSPEC 1.015 02/02/2017 1516   PHURINE 5.0 02/02/2017 1516   GLUCOSEU NEGATIVE 02/02/2017 1516   HGBUR NEGATIVE 02/02/2017 1516   BILIRUBINUR NEGATIVE 02/02/2017 1516    KETONESUR NEGATIVE 02/02/2017 1516   PROTEINUR NEGATIVE 02/02/2017 1516   UROBILINOGEN 0.2 05/29/2011 1728   NITRITE NEGATIVE 02/02/2017 1516   LEUKOCYTESUR TRACE (A) 02/02/2017 1516   Sepsis Labs Invalid input(s): PROCALCITONIN,  WBC,  LACTICIDVEN Microbiology No results found for this or any previous visit (from the past 240 hour(s)).   Time coordinating discharge: Over 30 minutes  SIGNED:   Elmarie Shiley, MD  [facility] 02/04/2017, 9:33 AM Pager   If 7PM-7AM, please contact night-coverage [email or web address] Password [email or web address]

## 2017-02-04 NOTE — Progress Notes (Addendum)
Nursing Note: At the bedside and informed pt that I had her night meds.Pt responded,"I already took them".Questioned pt about this , as I have not given her any meds tonight..Pt informed me that she took her xanax.I asked her where she got them.Pt showed me her purse.Pt had a bottle w/ xanax .5 mg tabs.Pt says she took 2 pills." I take them at bedtime for sleep and I occasionally take them during the day.Pt says she rarely takes them during the day,just when she is anxious.I informed pt that per hosital policy,meds are not to be left at the bedside and that she should only be taking what the doctor has ordered for her.I informed her that we could be over medicating her and unaware.Pt says "no". She say she will keep her medicine because she will probably be going home tomm.Pt verbalized that she understood but still would not allow me to take her meds.She says again,she will probably be leaving tomm. And that she really only takes them at bedtime.I aslo informed her that the [facility] had already ordered xanax prn for her.A; Paged on-call and made aware.Dr.Kakrakandy.He requested that I make House Coverage RN aware.I paged House Coverage and made her aware.2230 spoke w/ MD.2235 spoke with House Coverage,RN[Sara].Will continue to monitor pt.wbb

## 2017-02-04 NOTE — Care Management Note (Signed)
Case Management Note  Patient Details  Name: [name] MRN: [id number] Date of Birth: [birth year]  Subjective/Objective:     Castleman's syndrome               Action/Plan: Discharge Planning: Chart reviewed. No NCM needs identified.   PCP [name]   Expected Discharge Date:  02/04/17               Expected Discharge Plan:  Home/Self Care  In-House Referral:  NA  Discharge planning Services  CM Consult  Post Acute Care Choice:  NA Choice offered to:  NA  DME Arranged:  N/A DME Agency:  NA  HH Arranged:  NA HH Agency:  NA  Status of Service:  Completed, signed off  If discussed at [place] of Stay Meetings, dates discussed:    Additional Comments:  [name], RN 02/04/2017, 11:05 AM

## 2017-02-04 NOTE — Progress Notes (Signed)
Arouses easily during the night but slept well. Up to the bathroom without difficulty.wbb

## 2017-02-04 NOTE — Progress Notes (Signed)
Patient discharged to home with family, discharge instructions reviewed with patient who verbalized understanding. No new RX.

## 2017-02-04 NOTE — Progress Notes (Signed)
Subjective/Chief Complaint: She's feels much better today and has no pain. She reports that she wants to eat and go home   Objective: Vital signs in last 24 hours: Temp:  [97.5 F (36.4 C)-97.7 F (36.5 C)] 97.7 F (36.5 C) (07/07 0549) Pulse Rate:  [54-57] 54 (07/07 0549) Resp:  [18-20] 18 (07/07 0549) BP: (117-122)/(60-76) 122/76 (07/07 0549) SpO2:  [97 %-98 %] 98 % (07/07 0549) Last BM Date: 02/03/17  Intake/Output from previous day: 07/06 0701 - 07/07 0700 In: 2063.8 [I.V.:1963.8; IV Piggyback:100] Out: -  Intake/Output this shift: No intake/output data recorded.  Exam: Awake and alert Looks comfortable Abdomen is soft and non-tender  Lab Results:   Recent Labs  02/03/17 0341 02/04/17 0726  WBC 18.8* 12.5*  HGB 14.1 13.5  HCT 42.0 40.9  PLT 228 185   BMET  Recent Labs  02/03/17 0341 02/04/17 0726  NA 137 141  K 3.8 4.0  CL 100* 104  CO2 29 30  GLUCOSE 131* 87  BUN 18 17  CREATININE 0.88 1.00  CALCIUM 9.1 8.8*   PT/INR No results for input(s): LABPROT, INR in the last 72 hours. ABG No results for input(s): PHART, HCO3 in the last 72 hours.  Invalid input(s): PCO2, PO2  Studies/Results: Dg Chest 2 View  Result Date: 02/03/2017 CLINICAL DATA:  Right lower quadrant pain EXAM: CHEST  2 VIEW COMPARISON:  10/06/2016 FINDINGS: Cardiac shadow is within normal limits. The lungs are well aerated bilaterally. Mild left basilar atelectasis is noted. No bony abnormality is seen. IMPRESSION: Mild left basilar atelectasis. Electronically Signed   By: Inez Catalina M.D.   On: 02/03/2017 08:12   Ct Abdomen Pelvis W Contrast  Result Date: 02/02/2017 CLINICAL DATA:  61 year old female with acute right abdominal pain today. History of Castleman's disease. EXAM: CT ABDOMEN AND PELVIS WITH CONTRAST TECHNIQUE: Multidetector CT imaging of the abdomen and pelvis was performed using the standard protocol following bolus administration of intravenous contrast.  CONTRAST:  179mL ISOVUE-300 IOPAMIDOL (ISOVUE-300) INJECTION 61% COMPARISON:  10/06/2016 and prior CTs FINDINGS: Lower chest: No acute abnormality Hepatobiliary: The liver is unremarkable. Cholelithiasis identified without CT evidence of acute cholecystitis. No biliary dilatation. Pancreas: Unremarkable Spleen: Unremarkable Adrenals/Urinary Tract: There is mild stranding/ inflammation adjacent to the right adrenal gland which is otherwise unremarkable and unchanged in appearance since prior studies. The kidneys, left adrenal gland and bladder are are unremarkable. Stomach/Bowel: Stomach is within normal limits. No evidence of bowel wall thickening, distention, or inflammatory changes. Colonic diverticulosis noted without evidence of diverticulitis. Vascular/Lymphatic: Aortic atherosclerosis. No enlarged abdominal or pelvic lymph nodes. Unchanged small retroperitoneal lymph nodes are noted, the largest measuring 10 x 13 mm in the left periaortic region (image 35). No new or enlarging lymph nodes are identified. Reproductive: Uterus and bilateral adnexa are unremarkable. Other: No free fluid, focal collection or pneumoperitoneum. Musculoskeletal: No acute or significant osseous findings. IMPRESSION: New mild inflammation/stranding adjacent to the right adrenal gland, which appears normal. This is of uncertain clinical significance as there is no evidence of adrenal mass, hematoma or enlargement. No other acute abnormalities within the abdomen or pelvis. Cholelithiasis without CT evidence of acute cholecystitis. Unchanged shotty and mildly prominent retroperitoneal lymph nodes. Aortic Atherosclerosis (ICD10-I70.0). Electronically Signed   By: Margarette Canada M.D.   On: 02/02/2017 22:25   Nm Hepato W/eject Fract  Result Date: 02/03/2017 CLINICAL DATA:  Nausea and vomiting with right upper quadrant pain EXAM: NUCLEAR MEDICINE HEPATOBILIARY IMAGING WITH GALLBLADDER EF TECHNIQUE: Sequential images  of the abdomen were  obtained out to 60 minutes following intravenous administration of radiopharmaceutical. After oral ingestion of Ensure, gallbladder ejection fraction was determined. At 60 min, normal ejection fraction is greater than 33%. RADIOPHARMACEUTICALS:  4.97 mCi Tc-45m  Choletec IV COMPARISON:  None. FINDINGS: Prompt uptake and biliary excretion of activity by the liver is seen. Gallbladder activity is visualized, consistent with patency of cystic duct. Biliary activity passes into small bowel, consistent with patent common bile duct. Calculated gallbladder ejection fraction is 13.7%. (Normal gallbladder ejection fraction with Ensure is greater than 33%.) IMPRESSION: Normal uptake and excretion of biliary tracer is noted. Reduced gallbladder ejection fraction of 13%. Electronically Signed   By: Inez Catalina M.D.   On: 02/03/2017 16:56   US Abdomen Limited Ruq  Result Date: 02/02/2017 CLINICAL DATA:  Right upper quadrant pain. EXAM: ULTRASOUND ABDOMEN LIMITED RIGHT UPPER QUADRANT COMPARISON:  CT of the abdomen pelvis 10/06/2016 FINDINGS: Gallbladder: Numerous layering gallbladder stones, the largest measuring 9 mm. No evidence of gallbladder wall thickening or pericholecystic fluid. Sonographic Murphy's sign was recorded is negative. Common bile duct: Diameter: 5 mm. Liver: No focal lesion identified. Within normal limits in parenchymal echogenicity. IMPRESSION: Significant cholelithiasis without sonogram evidence of acute cholecystitis. Electronically Signed   By: Fidela Salisbury M.D.   On: 02/02/2017 19:21    Anti-infectives: Anti-infectives    Start     Dose/Rate Route Frequency Ordered Stop   02/03/17 1000  valACYclovir (VALTREX) tablet 1,000 mg  Status:  Discontinued     1,000 mg Oral 3 times daily 02/03/17 0136 02/03/17 0444   02/03/17 0800  piperacillin-tazobactam (ZOSYN) IVPB 3.375 g     3.375 g 12.5 mL/hr over 240 Minutes Intravenous Every 8 hours 02/03/17 0705     02/03/17 0015   piperacillin-tazobactam (ZOSYN) IVPB 3.375 g     3.375 g 100 mL/hr over 30 Minutes Intravenous  Once 02/03/17 0004 02/03/17 0321      Assessment/Plan: Symptomatic cholelithiasis  The HIDA scan was negative for cholecystitis.  Her WBC has improved.  I discussed the findings on ultrasound and the HIDA san with her.  I offered a lap chole this admission vs follow up as an outpt and elective outpt cholecystectomy as she does not have cholecystitis. She wants to hold on surgery. I will go ahead and place her on a diet. She can f/u with [facility] (Dr. Marlou Starks) as an outpt.  LOS: 1 day    Quincy Prisco A 02/04/2017

## 2017-02-08 LAB — CULTURE, BLOOD (ROUTINE X 2)
Culture: NO GROWTH
Culture: NO GROWTH
SPECIAL REQUESTS: ADEQUATE
Special Requests: ADEQUATE

## 2017-02-09 ENCOUNTER — Other Ambulatory Visit: Payer: Medicare HMO

## 2017-02-09 ENCOUNTER — Ambulatory Visit: Payer: Medicare HMO | Admitting: Internal Medicine

## 2017-02-13 DIAGNOSIS — G5601 Carpal tunnel syndrome, right upper limb: Secondary | ICD-10-CM | POA: Diagnosis not present

## 2017-02-14 DIAGNOSIS — D72829 Elevated white blood cell count, unspecified: Secondary | ICD-10-CM | POA: Diagnosis not present

## 2017-02-14 DIAGNOSIS — R945 Abnormal results of liver function studies: Secondary | ICD-10-CM | POA: Diagnosis not present

## 2017-02-14 DIAGNOSIS — R1011 Right upper quadrant pain: Secondary | ICD-10-CM | POA: Diagnosis not present

## 2017-02-15 DIAGNOSIS — D72829 Elevated white blood cell count, unspecified: Secondary | ICD-10-CM | POA: Diagnosis not present

## 2017-02-21 DIAGNOSIS — Z720 Tobacco use: Secondary | ICD-10-CM | POA: Diagnosis not present

## 2017-02-21 DIAGNOSIS — I2510 Atherosclerotic heart disease of native coronary artery without angina pectoris: Secondary | ICD-10-CM | POA: Diagnosis not present

## 2017-02-21 DIAGNOSIS — G510 Bell's palsy: Secondary | ICD-10-CM | POA: Diagnosis not present

## 2017-02-21 DIAGNOSIS — Z716 Tobacco abuse counseling: Secondary | ICD-10-CM | POA: Diagnosis not present

## 2017-02-21 DIAGNOSIS — E039 Hypothyroidism, unspecified: Secondary | ICD-10-CM | POA: Diagnosis not present

## 2017-02-21 DIAGNOSIS — I10 Essential (primary) hypertension: Secondary | ICD-10-CM | POA: Diagnosis not present

## 2017-02-21 DIAGNOSIS — I252 Old myocardial infarction: Secondary | ICD-10-CM | POA: Diagnosis not present

## 2017-02-21 DIAGNOSIS — I639 Cerebral infarction, unspecified: Secondary | ICD-10-CM | POA: Diagnosis not present

## 2017-02-21 DIAGNOSIS — R69 Illness, unspecified: Secondary | ICD-10-CM | POA: Diagnosis not present

## 2017-02-21 DIAGNOSIS — E785 Hyperlipidemia, unspecified: Secondary | ICD-10-CM | POA: Diagnosis not present

## 2017-02-23 ENCOUNTER — Ambulatory Visit (INDEPENDENT_AMBULATORY_CARE_PROVIDER_SITE_OTHER): Payer: Medicare HMO | Admitting: Neurology

## 2017-02-23 ENCOUNTER — Encounter: Payer: Self-pay | Admitting: Neurology

## 2017-02-23 VITALS — BP 157/95 | HR 74 | Ht 63.0 in | Wt 172.0 lb

## 2017-02-23 DIAGNOSIS — G5601 Carpal tunnel syndrome, right upper limb: Secondary | ICD-10-CM | POA: Insufficient documentation

## 2017-02-23 DIAGNOSIS — R29810 Facial weakness: Secondary | ICD-10-CM

## 2017-02-23 DIAGNOSIS — R278 Other lack of coordination: Secondary | ICD-10-CM

## 2017-02-23 DIAGNOSIS — D47Z2 Castleman disease: Secondary | ICD-10-CM | POA: Diagnosis not present

## 2017-02-23 DIAGNOSIS — M6281 Muscle weakness (generalized): Secondary | ICD-10-CM

## 2017-02-23 NOTE — Addendum Note (Signed)
Addended by: Larey Seat on: 02/23/2017 10:39 AM   Modules accepted: Orders

## 2017-02-23 NOTE — Progress Notes (Signed)
Provider:  Larey Seat, M D  Referring Provider: [name], MD Primary Care Physician:  [name], MD  Chief Complaint  Patient presents with  . New Patient (Initial Visit)    used to se Dr Jannifer Franklin  patient is alone   HPI:  [name] is a 61 y.o. female  Is seen here as a referral from the [facility] ED for a follow up on Bells Palsy.  The patient has followed Dr Jannifer Franklin for ocular migraine.   [name] is a 61 year old Caucasian female that went to the [facility] ED on 01/27/2017 and was diagnosed with Bell's palsy. She was afraid she may have had a stroke based on the facial palsy. A CAT scan was performed in the ED followed by a brain MRI that revealed a remote stroke in the past this must have been a silent stroke ,as she never had strokelike symptoms before. The patient was put on prednisone and acyclovir and took it for 1 week, before returning to hospital with upper right quadrant pain abdominal. She was diagnosed with a inflamed gallbladder. She left the hospital on 02/04/2017 with antibiotics.   ON the record today : She is treatment for carpal tunnel syndrome since last July by Dr. Caralyn Guile at [place], had nerve conduction studies done, had injections into both wrists. Then had surgery on the right hand the most affected 1. The right hand since then has been impaired ( function). She can't write, she can't grasp or hold onto anything.  Dr. Caralyn Guile full followed with an ulnar nerve test which was normal. He started to suggest that it was a CNS related right hand dysfunction. Again this was in 2017. The patient also has a history of Castleman disease since 2012 and of coronary artery disease and myocardial infarction in 2007.   Review of Systems: Out of a complete 14 system review, the patient complains of only the following symptoms, and all other reviewed systems are negative.  Bells palsy on the right , June 27th, speech slurred. She went to  ED on the 29th after 2 days of symptoms. Neurologist saw her when she returned a week later - Bells palsy.   Social History   Social History  . Marital status: Married    Spouse name: N/A  . Number of children: N/A  . Years of education: N/A   Occupational History  . Not on file.   Social History Main Topics  . Smoking status: Current Every Day Smoker    Packs/day: 0.50    Years: 25.00    Types: Cigarettes  . Smokeless tobacco: Current User     Comment: vapor cig sometimes  . Alcohol use Yes  . Drug use: No  . Sexual activity: Not Currently   Other Topics Concern  . Not on file   Social History Narrative  . No narrative on file    Family History  Problem Relation Age of Onset  . Cancer Mother        lung  . Heart disease Father     Past Medical History:  Diagnosis Date  . Castleman's disease ([place])   . Chronic kidney disease    failure  . Heart attack ([place]) 2007  . Heart attack ([place]) 2007  . Hypertension   . Hypothyroidism   . ITP (idiopathic thrombocytopenic purpura) 06/17/2011  . Pulmonary nodule 10/10/2016  . Retroperitoneal lymphadenopathy 2012    Past Surgical History:  Procedure Laterality Date  .  APPENDECTOMY     61 years old  . CESAREAN SECTION  1983 1989    Current Outpatient Prescriptions  Medication Sig Dispense Refill  . ALPRAZolam (XANAX) 0.25 MG tablet Take 0.5 mg by mouth at bedtime as needed for sleep. For sleep    . aspirin 81 MG chewable tablet Chew 81 mg by mouth daily.    Marland Kitchen levothyroxine (SYNTHROID, LEVOTHROID) 112 MCG tablet Take 112 mcg by mouth daily before breakfast.     . metoprolol succinate (TOPROL-XL) 25 MG 24 hr tablet Take 12.5 mg by mouth daily.      No current facility-administered medications for this visit.    Facility-Administered Medications Ordered in Other Visits  Medication Dose Route Frequency Provider Last Rate Last Dose  . sodium chloride 0.9 % injection 10 mL  10 mL Intracatheter PRN Curt Bears, MD         Allergies as of 02/23/2017 - Review Complete 02/23/2017  Allergen Reaction Noted  . Cefdinir Other (See Comments) 02/04/2011  . Levofloxacin Itching 06/17/2011  . Vicodin [hydrocodone-acetaminophen] Itching 06/17/2011    Vitals: BP (!) 157/95   Pulse 74   Ht 5\' 3"  (1.6 m)   Wt 172 lb (78 kg)   BMI 30.47 kg/m  Last Weight:  Wt Readings from Last 1 Encounters:  02/23/17 172 lb (78 kg)   Last Height:   Ht Readings from Last 1 Encounters:  02/23/17 5\' 3"  (1.6 m)   CLINICAL DATA:  Right-sided facial droop  EXAM: MRI HEAD WITHOUT CONTRAST  TECHNIQUE: Multiplanar, multiecho pulse sequences of the brain and surrounding structures were obtained without intravenous contrast.  COMPARISON:  Head CT 01/27/2017  FINDINGS: Brain: The midline structures are normal. There is no focal diffusion restriction to indicate acute infarct. There is encephalomalacia within the left frontal lobe of the site of remote infarct. Parenchymal signal is otherwise normal. No intraparenchymal hematoma or chronic microhemorrhage. Brain volume is normal for age without age-advanced or lobar predominant atrophy. The dura is normal and there is no extra-axial collection.  Vascular: Major intracranial arterial and venous sinus flow voids are preserved.  Skull and upper cervical spine: The visualized skull base, calvarium, upper cervical spine and extracranial soft tissues are normal.  Sinuses/Orbits: No fluid levels or advanced mucosal thickening. No mastoid effusion. Normal orbits.  IMPRESSION: Old left frontal lobe infarct without acute intracranial abnormality.   Electronically Signed   By: Ulyses Jarred M.D.   On: 01/27/2017 17:33  Physical exam:  General: The patient is awake, alert and appears not in acute distress. The patient is well groomed. Head: Normocephalic, atraumatic. Neck is supple. Mallampati 4, neck circumference: 16 Cardiovascular:  Regular rate and rhythm  , without  murmurs or carotid bruit, and without distended neck veins. Respiratory: Lungs are clear to auscultation. Skin:  Without evidence of edema, or rash Trunk: BMI is  elevated and patient  has normal posture.  Neurologic exam : The patient is awake and alert, oriented to place and time.  Memory subjective described as intact. There is a normal attention span & concentration ability. Speech is  Severely affected- fluent with dysarthria , not aphasia.  Mood and affect are appropriate.  Cranial nerves: Pupils are equal and briskly reactive to light. Funduscopic exam without evidence of pallor or edema. Extraocular movements  in vertical and horizontal planes intact and without nystagmus.  Visual fields by finger perimetry are intact. Hearing to finger rub intact.  Facial sensation intact to fine touch. Facial motor strength  upper face is symmetric, only  lower facial weakness on the right. Not Bells.  Tongue protrusion into either cheek is normal. Shoulder shrug is normal.   Motor exam:  The patient has a slight right shoulder droop, her right upper extremity is darker in color, it is not cold, not hot and not puffy. She does have a weaker grip strengths pinch strengths on the right  Coordination: The patient demonstrated her clumsiness when she wrote a note today her handwriting is significantly changed, she is right-hand dominant and she is unable to move the patent in a legible manner. Finger-to-nose maneuver  normal without evidence of ataxia, dysmetria or tremor.  Gait and station: Patient walks without assistive device . Deep tendon reflexes: in the  upper and lower extremities are attenuated -symmetric. Babinski maneuver response is downgoing.  Mrs. Lorey's right lower facial droop must be of central origin, her eyebrows move symmetrically she does not have upper facial weakness, she has reported drooling, she has quite significant speech impairment and her facial droop occurred  rather suddenly. The MRI did not show an acute stroke explaining these findings but an old left frontal stroke.  I am baffled as to her right hand clumsiness and weakness and discoloration being a part of one syndrome. She was diagnosed with a very rare than for proliferative disease,Castleman's disease. I was only familiar with Castleman's disease in the context of Kaposi's sarcoma.The patient has no swollen axillary , or cervical lymphnodes that I could palpate.  She carries a diagnosis of renal disease , but told me her" kidneys are fine". I reviewed her labs, Creatinine is 1.0, she is fine to go with gardolinium.  She has seen Oncologist Dr. Earlie Server.    Assessment:  After physical and neurologic examination, review of laboratory studies, imaging, neurophysiology testing and pre-existing records, assessment is that of :   Central facial palsy.   Unexplained right arm neuropathy  To explain her complex peripheral nerve findings, I will order an MRI of there cervical spine . I will also order NCV and EMG from paraspinal on- upper right extremities.      Asencion Partridge Jericha Bryden MD 02/23/2017

## 2017-02-23 NOTE — Patient Instructions (Signed)
Idiopathic Thrombocytopenic Purpura Idiopathic thrombocytopenic purpura (ITP) is a disease in which your body's defense system (immune system) attacks your platelets. Platelets are blood cells that help form clots and seal leaks in damaged blood vessels. With ITP, you to have too few platelets. As a result, you bleed more easily. It is also harder for your body to stop any bleeding. In adults, ITP is usually a long-term disease. What are the causes? The cause is unknown. ITP may develop after a viral infection, during pregnancy, or from an immune disorder. What increases the risk? The risk of ITP may be greater among:  Women.  Adults 20-50 years old.  What are the signs or symptoms? Common signs and symptoms include:  Easy bruising.  A cut that bleeds for a long time.  Tiny purple blood dots (petechiae) under the skin, especially on the shins.  Blood in urine or bowel movements.  Nosebleeds.  Bleeding gums.  Heavy menstrual periods.  Mild forms of ITP may not cause any symptoms. How is this diagnosed? Your health care provider may suspect ITP based on your signs and symptoms. To make a diagnosis, your health care provider may do a physical exam and order blood tests to:  Find how many platelets you have.  See how well your blood clots.  Your health care provider may then do blood tests or a bone marrow test to rule out other conditions that could be causing your symptoms. How is this treated? Treatment depends on the severity of your condition. Options include:  Monitoring of your condition and platelet count.  Blood transfusions of antibodies or platelets.  Medicines such as: ? Strong anti-inflammatory medicines (steroids). ? Medicines to boost platelet production. ? Medicines to suppress your immune system.  Removal of your spleen. This may be done if other treatments do not work.  Follow these instructions at home:  Take medicines only as directed by your  health care provider.  Do not take over-the-counter medicines that lower your platelet count, affect platelet function, or affect your blood's ability to clot. These include aspirin and ibuprofen.  Do not participate in contact sports or other high-risk activities. Ask your health care provider which activities are safe for you.  Keep all follow-up visits as directed by your health care provider. This is important. Contact a health care provider if:  You have new symptoms.  Your symptoms get worse. Get help right away if:  You have a sudden severe headache or confusion.  You have significant bleeding.  You have nausea and vomiting. This information is not intended to replace advice given to you by your health care provider. Make sure you discuss any questions you have with your health care provider. Document Released: 02/12/2014 Document Revised: 12/24/2015 Document Reviewed: 12/05/2013 Elsevier Interactive Patient Education  2018 Elsevier Inc.  

## 2017-03-02 ENCOUNTER — Ambulatory Visit (INDEPENDENT_AMBULATORY_CARE_PROVIDER_SITE_OTHER): Payer: Medicare HMO | Admitting: Diagnostic Neuroimaging

## 2017-03-02 ENCOUNTER — Encounter (INDEPENDENT_AMBULATORY_CARE_PROVIDER_SITE_OTHER): Payer: Self-pay | Admitting: Diagnostic Neuroimaging

## 2017-03-02 DIAGNOSIS — R278 Other lack of coordination: Secondary | ICD-10-CM | POA: Diagnosis not present

## 2017-03-02 DIAGNOSIS — R29810 Facial weakness: Secondary | ICD-10-CM

## 2017-03-02 DIAGNOSIS — G5601 Carpal tunnel syndrome, right upper limb: Secondary | ICD-10-CM

## 2017-03-02 DIAGNOSIS — D47Z2 Castleman disease: Secondary | ICD-10-CM

## 2017-03-02 DIAGNOSIS — Z0289 Encounter for other administrative examinations: Secondary | ICD-10-CM

## 2017-03-03 NOTE — Progress Notes (Signed)
[facility]  NCS (NERVE CONDUCTION STUDY) WITH EMG (ELECTROMYOGRAPHY) REPORT    STUDY DATE: 03/02/17 PATIENT NAME: [name] DOB: 01/11/1956 MRN: [id number]  ORDERING CLINICIAN: Larey Seat, MD   TECHNOLOGIST: Oneita Jolly  ELECTROMYOGRAPHER: Earlean Polka. Defne Gerling, MD  CLINICAL INFORMATION: 61 year female with right hand weakness. History of right carpal tunnel release surgery.  FINDINGS: NERVE CONDUCTION STUDY: Bilateral median motor responses have prolonged distal latencies (right 4.6 ms, left 4.8 ms), normal amplitudes, normal conduction velocities.  Bilateral ulnar motor responses and F wave latencies are normal.  Bilateral median sensory responses have prolonged peak latencies and normal amplitudes.  Bilateral ulnar sensory responses are normal.   NEEDLE ELECTROMYOGRAPHY: Needle examination of right upper extremity deltoid, biceps, triceps, flexor carpi radialis, first dorsal interosseous is normal.   IMPRESSION:  Abnormal study demonstrating: - Mild bilateral median neuropathies at the wrist consistent with mild bilateral carpal tunnel syndrome.     INTERPRETING PHYSICIAN:  Penni Bombard, MD Certified in Neurology, Neurophysiology and Neuroimaging  [facility] [address], [place], [place] [address] [phone]   [facility]    Nerve / Sites Muscle Latency Ref. Amplitude Ref. Rel Amp Segments Distance Velocity Ref. Area    ms ms mV mV %  cm m/s m/s mVms  R Median - APB     Wrist APB 4.6 ?4.4 7.5 ?4.0 100 Wrist - APB 7   17.6     Upper arm APB 8.1  7.1  94.2 Upper arm - Wrist 19 54 ?49 17.6  L Median - APB     Wrist APB 4.8 ?4.4 8.1 ?4.0 100 Wrist - APB 7   29.3     Upper arm APB 8.3  8.0  98.5 Upper arm - Wrist 19 54 ?49 28.8  R Ulnar - ADM     Wrist ADM 2.6 ?3.3 7.1 ?6.0 100 Wrist - ADM 7   21.9     B.Elbow ADM 5.8  6.7  93.3 B.Elbow - Wrist 17 54 ?49 22.4     A.Elbow ADM 7.6   6.1  91.5 A.Elbow - B.Elbow 10 55 ?49 21.6         A.Elbow - Wrist      L Ulnar - ADM     Wrist ADM 2.9 ?3.3 7.4 ?6.0 100 Wrist - ADM 7   26.0     B.Elbow ADM 6.2  6.8  91.2 B.Elbow - Wrist 17 52 ?49 25.9     A.Elbow ADM 8.0  6.7  99.5 A.Elbow - B.Elbow 10 55 ?49 25.6         A.Elbow - Wrist                 SNC    Nerve / Sites Rec. Site Peak Lat Ref.  Amp Ref. Segments Distance    ms ms V V  cm  R Median - Orthodromic (Dig II, Mid palm)     Dig II Wrist 3.6 ?3.4 13 ?10 Dig II - Wrist 13  L Median - Orthodromic (Dig II, Mid palm)     Dig II Wrist 3.5 ?3.4 15 ?10 Dig II - Wrist 13  R Ulnar - Orthodromic, (Dig V, Mid palm)     Dig V Wrist 2.4 ?3.1 10 ?5 Dig V - Wrist 11  L Ulnar - Orthodromic, (Dig V, Mid palm)     Dig V Wrist 2.9 ?3.1 15 ?5 Dig V - Wrist 11  F  Wave    Nerve F Lat Ref.   ms ms  R Ulnar - ADM 25.4 ?32.0  L Ulnar - ADM 26.4 ?32.0         EMG full       EMG Summary Table    Spontaneous MUAP Recruitment  Muscle IA Fib PSW Fasc Other Amp Dur. Poly Pattern  R. Deltoid Normal None None None _______ Normal Normal Normal Normal  R. Biceps brachii Normal None None None _______ Normal Normal Normal Normal  R. Triceps brachii Normal None None None _______ Normal Normal Normal Normal  R. Flexor carpi radialis Normal None None None _______ Normal Normal Normal Normal  R. First dorsal interosseous Normal None None None _______ Normal Normal Normal Normal

## 2017-03-03 NOTE — Procedures (Signed)
   [facility]  NCS (NERVE CONDUCTION STUDY) WITH EMG (ELECTROMYOGRAPHY) REPORT   STUDY DATE: 03/02/17 PATIENT NAME: [name] DOB: [birth year] MRN: [id number]  ORDERING CLINICIAN: Larey Seat, MD   TECHNOLOGIST: Oneita Jolly  ELECTROMYOGRAPHER: Earlean Polka. Penumalli, MD  CLINICAL INFORMATION: 61 year female with right hand weakness. History of right carpal tunnel release surgery.  FINDINGS: NERVE CONDUCTION STUDY: Bilateral median motor responses have prolonged distal latencies (right 4.6 ms, left 4.8 ms), normal amplitudes, normal conduction velocities.  Bilateral ulnar motor responses and F wave latencies are normal.  Bilateral median sensory responses have prolonged peak latencies and normal amplitudes.  Bilateral ulnar sensory responses are normal.   NEEDLE ELECTROMYOGRAPHY: Needle examination of right upper extremity deltoid, biceps, triceps, flexor carpi radialis, first dorsal interosseous is normal.   IMPRESSION:  Abnormal study demonstrating: - Mild bilateral median neuropathies at the wrist consistent with mild bilateral carpal tunnel syndrome.    INTERPRETING PHYSICIAN:  Penni Bombard, MD Certified in Neurology, Neurophysiology and Neuroimaging  [facility] [address], [place], [place] [address] [phone]

## 2017-03-06 ENCOUNTER — Telehealth: Payer: Self-pay | Admitting: Neurology

## 2017-03-06 DIAGNOSIS — R945 Abnormal results of liver function studies: Secondary | ICD-10-CM | POA: Diagnosis not present

## 2017-03-06 DIAGNOSIS — R590 Localized enlarged lymph nodes: Secondary | ICD-10-CM | POA: Diagnosis not present

## 2017-03-06 DIAGNOSIS — D72829 Elevated white blood cell count, unspecified: Secondary | ICD-10-CM | POA: Diagnosis not present

## 2017-03-06 DIAGNOSIS — Z6829 Body mass index (BMI) 29.0-29.9, adult: Secondary | ICD-10-CM | POA: Diagnosis not present

## 2017-03-06 DIAGNOSIS — E038 Other specified hypothyroidism: Secondary | ICD-10-CM | POA: Diagnosis not present

## 2017-03-06 DIAGNOSIS — G510 Bell's palsy: Secondary | ICD-10-CM | POA: Diagnosis not present

## 2017-03-06 DIAGNOSIS — R1011 Right upper quadrant pain: Secondary | ICD-10-CM | POA: Diagnosis not present

## 2017-03-06 NOTE — Telephone Encounter (Signed)
[name] just called again. She wanted to know if Dr. Brett Fairy has done the peer to peer because she is worried since her appointment is on Wednesday morning at [place].

## 2017-03-06 NOTE — Telephone Encounter (Signed)
Pt has called asking that Dr Brett Fairy calls [facility] so that pt is able to keep her current appointment.  Pt states that Is unable to speak clearly and very much wants to keep current appointment

## 2017-03-06 NOTE — Telephone Encounter (Signed)
Patient is scheduled to have both of the MRI's this Wednesday 03/08/17. Dorian Pod with [facility] just contacted me and informed me that it is pending a denial & if the provider chooses to do a P2P she can call [phone] opt 4 & 2 & reference case # [id number].

## 2017-03-07 NOTE — Telephone Encounter (Signed)
Neck cervical spine  MRI is pending review by the medical director at [facility].

## 2017-03-07 NOTE — Telephone Encounter (Signed)
Pt calling with concern that she has not heard from anyone.  Pt would like a call back to know if Dr Brett Fairy has contacted [facility] re: her cervical and brain MRI which is scheduled for tomorrow morning @ [facility].  Please call pt

## 2017-03-07 NOTE — Telephone Encounter (Signed)
MRI brain is approved.

## 2017-03-07 NOTE — Telephone Encounter (Signed)
Called and discussed that the MRI of brain was approved and that at this time we are pending approval for the neck to be done. Pt apt is at 3 pm tomorrow and so we are hopefull to hear whether the patient will be approved before her apt. Pt verbalized understanding

## 2017-03-07 NOTE — Progress Notes (Signed)
I agree with the assessment and plan as directed . An MRI brain with and without has been ordered after my visit with her . I asked for the cervical spine as well. Given that she has a lymphatic disorder with pulmonary nodules and mediastinal lymphnode swelling, I am concerned about this as well.       Isacc Turney, MD

## 2017-03-08 ENCOUNTER — Ambulatory Visit
Admission: RE | Admit: 2017-03-08 | Discharge: 2017-03-08 | Disposition: A | Discharge status: Home / Self Care | Payer: Medicare HMO | Source: Ambulatory Visit | Attending: Neurology | Admitting: Neurology

## 2017-03-08 DIAGNOSIS — M6281 Muscle weakness (generalized): Secondary | ICD-10-CM | POA: Diagnosis not present

## 2017-03-08 DIAGNOSIS — R278 Other lack of coordination: Secondary | ICD-10-CM

## 2017-03-08 DIAGNOSIS — M50223 Other cervical disc displacement at C6-C7 level: Secondary | ICD-10-CM | POA: Diagnosis not present

## 2017-03-08 DIAGNOSIS — G5601 Carpal tunnel syndrome, right upper limb: Secondary | ICD-10-CM

## 2017-03-08 DIAGNOSIS — D47Z2 Castleman disease: Secondary | ICD-10-CM

## 2017-03-08 DIAGNOSIS — R29810 Facial weakness: Secondary | ICD-10-CM

## 2017-03-08 MED ORDER — GADOBENATE DIMEGLUMINE 529 MG/ML IV SOLN: 15.0000 mL | Freq: Once | INTRAVENOUS | Status: DC | PRN

## 2017-03-08 NOTE — Telephone Encounter (Signed)
Noted, thank you

## 2017-03-09 ENCOUNTER — Observation Stay (HOSPITAL_COMMUNITY): Payer: Medicare HMO

## 2017-03-09 ENCOUNTER — Observation Stay (HOSPITAL_COMMUNITY)
Admission: EM | Admit: 2017-03-09 | Discharge: 2017-03-10 | Disposition: A | Discharge status: Home / Self Care | Payer: Medicare HMO | Attending: Family Medicine | Admitting: Family Medicine

## 2017-03-09 ENCOUNTER — Telehealth: Payer: Self-pay | Admitting: Neurology

## 2017-03-09 ENCOUNTER — Encounter (HOSPITAL_COMMUNITY): Payer: Self-pay | Admitting: Emergency Medicine

## 2017-03-09 DIAGNOSIS — R29898 Other symptoms and signs involving the musculoskeletal system: Secondary | ICD-10-CM

## 2017-03-09 DIAGNOSIS — I2510 Atherosclerotic heart disease of native coronary artery without angina pectoris: Secondary | ICD-10-CM | POA: Diagnosis not present

## 2017-03-09 DIAGNOSIS — E1122 Type 2 diabetes mellitus with diabetic chronic kidney disease: Secondary | ICD-10-CM | POA: Diagnosis not present

## 2017-03-09 DIAGNOSIS — Z6830 Body mass index (BMI) 30.0-30.9, adult: Secondary | ICD-10-CM | POA: Diagnosis not present

## 2017-03-09 DIAGNOSIS — Z79899 Other long term (current) drug therapy: Secondary | ICD-10-CM | POA: Insufficient documentation

## 2017-03-09 DIAGNOSIS — I252 Old myocardial infarction: Secondary | ICD-10-CM | POA: Insufficient documentation

## 2017-03-09 DIAGNOSIS — E785 Hyperlipidemia, unspecified: Secondary | ICD-10-CM | POA: Diagnosis not present

## 2017-03-09 DIAGNOSIS — N189 Chronic kidney disease, unspecified: Secondary | ICD-10-CM | POA: Insufficient documentation

## 2017-03-09 DIAGNOSIS — I639 Cerebral infarction, unspecified: Principal | ICD-10-CM | POA: Diagnosis present

## 2017-03-09 DIAGNOSIS — G510 Bell's palsy: Secondary | ICD-10-CM

## 2017-03-09 DIAGNOSIS — D693 Immune thrombocytopenic purpura: Secondary | ICD-10-CM | POA: Diagnosis present

## 2017-03-09 DIAGNOSIS — R29810 Facial weakness: Secondary | ICD-10-CM | POA: Diagnosis not present

## 2017-03-09 DIAGNOSIS — E669 Obesity, unspecified: Secondary | ICD-10-CM | POA: Diagnosis not present

## 2017-03-09 DIAGNOSIS — R69 Illness, unspecified: Secondary | ICD-10-CM | POA: Diagnosis not present

## 2017-03-09 DIAGNOSIS — R531 Weakness: Secondary | ICD-10-CM | POA: Insufficient documentation

## 2017-03-09 DIAGNOSIS — I129 Hypertensive chronic kidney disease with stage 1 through stage 4 chronic kidney disease, or unspecified chronic kidney disease: Secondary | ICD-10-CM | POA: Diagnosis not present

## 2017-03-09 DIAGNOSIS — F17210 Nicotine dependence, cigarettes, uncomplicated: Secondary | ICD-10-CM | POA: Diagnosis not present

## 2017-03-09 DIAGNOSIS — I6602 Occlusion and stenosis of left middle cerebral artery: Secondary | ICD-10-CM | POA: Insufficient documentation

## 2017-03-09 DIAGNOSIS — E1140 Type 2 diabetes mellitus with diabetic neuropathy, unspecified: Secondary | ICD-10-CM | POA: Diagnosis not present

## 2017-03-09 DIAGNOSIS — I10 Essential (primary) hypertension: Secondary | ICD-10-CM | POA: Diagnosis present

## 2017-03-09 DIAGNOSIS — E039 Hypothyroidism, unspecified: Secondary | ICD-10-CM | POA: Diagnosis not present

## 2017-03-09 DIAGNOSIS — Z7982 Long term (current) use of aspirin: Secondary | ICD-10-CM | POA: Insufficient documentation

## 2017-03-09 DIAGNOSIS — I6522 Occlusion and stenosis of left carotid artery: Secondary | ICD-10-CM | POA: Diagnosis not present

## 2017-03-09 LAB — HEMOGLOBIN A1C
Hgb A1c MFr Bld: 6.1 % — ABNORMAL HIGH (ref 4.8–5.6)
MEAN PLASMA GLUCOSE: 128.37 mg/dL

## 2017-03-09 LAB — I-STAT TROPONIN, ED: Troponin i, poc: 0 ng/mL (ref 0.00–0.08)

## 2017-03-09 LAB — COMPREHENSIVE METABOLIC PANEL
ALBUMIN: 4.4 g/dL (ref 3.5–5.0)
ALT: 10 U/L — ABNORMAL LOW (ref 14–54)
ANION GAP: 7 (ref 5–15)
AST: 15 U/L (ref 15–41)
Alkaline Phosphatase: 95 U/L (ref 38–126)
BUN: 13 mg/dL (ref 6–20)
CALCIUM: 9.8 mg/dL (ref 8.9–10.3)
CHLORIDE: 105 mmol/L (ref 101–111)
CO2: 27 mmol/L (ref 22–32)
Creatinine, Ser: 0.73 mg/dL (ref 0.44–1.00)
GFR calc non Af Amer: >60 mL/min (ref >60)
GLUCOSE: 93 mg/dL (ref 65–99)
POTASSIUM: 4.4 mmol/L (ref 3.5–5.1)
SODIUM: 139 mmol/L (ref 135–145)
Total Bilirubin: 0.6 mg/dL (ref 0.3–1.2)
Total Protein: 7.2 g/dL (ref 6.5–8.1)

## 2017-03-09 LAB — DIFFERENTIAL
BASOS PCT: 0 %
Basophils Absolute: 0 10*3/uL (ref 0.0–0.1)
EOS ABS: 0.2 10*3/uL (ref 0.0–0.7)
EOS PCT: 2 %
LYMPHS PCT: 28 %
Lymphs Abs: 3 10*3/uL (ref 0.7–4.0)
MONO ABS: 0.6 10*3/uL (ref 0.1–1.0)
Monocytes Relative: 6 %
NEUTROS PCT: 64 %
Neutro Abs: 6.9 10*3/uL (ref 1.7–7.7)

## 2017-03-09 LAB — I-STAT CHEM 8, ED
BUN: 15 mg/dL (ref 6–20)
CALCIUM ION: 1.25 mmol/L (ref 1.15–1.40)
CHLORIDE: 104 mmol/L (ref 101–111)
Creatinine, Ser: 0.8 mg/dL (ref 0.44–1.00)
Glucose, Bld: 89 mg/dL (ref 65–99)
HEMATOCRIT: 45 % (ref 36.0–46.0)
Hemoglobin: 15.3 g/dL — ABNORMAL HIGH (ref 12.0–15.0)
Potassium: 4.3 mmol/L (ref 3.5–5.1)
SODIUM: 140 mmol/L (ref 135–145)
TCO2: 30 mmol/L (ref 0–100)

## 2017-03-09 LAB — CBC
HCT: 44.6 % (ref 36.0–46.0)
Hemoglobin: 14.6 g/dL (ref 12.0–15.0)
MCH: 30.2 pg (ref 26.0–34.0)
MCHC: 32.7 g/dL (ref 30.0–36.0)
MCV: 92.1 fL (ref 78.0–100.0)
PLATELETS: 239 10*3/uL (ref 150–400)
RBC: 4.84 MIL/uL (ref 3.87–5.11)
RDW: 13.6 % (ref 11.5–15.5)
WBC: 10.8 10*3/uL — ABNORMAL HIGH (ref 4.0–10.5)

## 2017-03-09 LAB — APTT: aPTT: 26 seconds (ref 24–36)

## 2017-03-09 LAB — PROTIME-INR
INR: 1.02
PROTHROMBIN TIME: 13.4 s (ref 11.4–15.2)

## 2017-03-09 LAB — LIPID PANEL
CHOL/HDL RATIO: 8 ratio
CHOLESTEROL: 201 mg/dL — AB (ref 0–200)
HDL: 25 mg/dL — ABNORMAL LOW (ref >40)
LDL Cholesterol: 143 mg/dL — ABNORMAL HIGH (ref 0–99)
TRIGLYCERIDES: 163 mg/dL — AB (ref <150)
VLDL: 33 mg/dL (ref 0–40)

## 2017-03-09 MED ORDER — ENOXAPARIN SODIUM 40 MG/0.4ML ~~LOC~~ SOLN
40.0000 mg | SUBCUTANEOUS | Status: DC
  Administered 2017-03-09: 40 mg via SUBCUTANEOUS
  Filled 2017-03-09: qty 0.4

## 2017-03-09 MED ORDER — SODIUM CHLORIDE 0.9 % IV SOLN: INTRAVENOUS | Status: AC

## 2017-03-09 MED ORDER — STROKE: EARLY STAGES OF RECOVERY BOOK
Freq: Once | Status: DC
  Filled 2017-03-09: qty 1

## 2017-03-09 MED ORDER — ACETAMINOPHEN 325 MG PO TABS: 650.0000 mg | ORAL_TABLET | ORAL | Status: DC | PRN

## 2017-03-09 MED ORDER — ASPIRIN 325 MG PO TABS
325.0000 mg | ORAL_TABLET | Freq: Every day | ORAL | Status: DC
  Administered 2017-03-09 – 2017-03-10 (×2): 325 mg via ORAL
  Filled 2017-03-09 (×2): qty 1

## 2017-03-09 MED ORDER — ACETAMINOPHEN 160 MG/5ML PO SOLN: 650.0000 mg | ORAL | Status: DC | PRN

## 2017-03-09 MED ORDER — ALPRAZOLAM 0.5 MG PO TABS
0.5000 mg | ORAL_TABLET | Freq: Every evening | ORAL | Status: DC | PRN
  Administered 2017-03-09: 0.5 mg via ORAL
  Filled 2017-03-09: qty 1

## 2017-03-09 MED ORDER — METOPROLOL SUCCINATE 12.5 MG HALF TABLET: 12.5000 mg | ORAL_TABLET | Freq: Every day | ORAL | Status: DC

## 2017-03-09 MED ORDER — ATORVASTATIN CALCIUM 80 MG PO TABS
80.0000 mg | ORAL_TABLET | Freq: Every day | ORAL | Status: DC
  Administered 2017-03-09 – 2017-03-10 (×2): 80 mg via ORAL
  Filled 2017-03-09 (×2): qty 1

## 2017-03-09 MED ORDER — METOPROLOL SUCCINATE ER 25 MG PO TB24
12.5000 mg | ORAL_TABLET | Freq: Every day | ORAL | Status: DC
  Administered 2017-03-10: 12.5 mg via ORAL
  Filled 2017-03-09: qty 1

## 2017-03-09 MED ORDER — ASPIRIN 300 MG RE SUPP: 300.0000 mg | Freq: Every day | RECTAL | Status: DC

## 2017-03-09 MED ORDER — ACETAMINOPHEN 650 MG RE SUPP: 650.0000 mg | RECTAL | Status: DC | PRN

## 2017-03-09 MED ORDER — LORAZEPAM 1 MG PO TABS
0.5000 mg | ORAL_TABLET | Freq: Once | ORAL | Status: AC
  Administered 2017-03-09: 0.5 mg via ORAL
  Filled 2017-03-09: qty 1

## 2017-03-09 MED ORDER — SENNOSIDES-DOCUSATE SODIUM 8.6-50 MG PO TABS
1.0000 | ORAL_TABLET | Freq: Every evening | ORAL | Status: DC | PRN
  Filled 2017-03-09: qty 1

## 2017-03-09 MED ORDER — LEVOTHYROXINE SODIUM 112 MCG PO TABS
112.0000 ug | ORAL_TABLET | Freq: Every day | ORAL | Status: DC
  Administered 2017-03-10: 112 ug via ORAL
  Filled 2017-03-09 (×2): qty 1

## 2017-03-09 NOTE — Consult Note (Signed)
Requesting Physician: Dr. Marily Memos    Chief Complaint:  Right facial droop, dysarthria, right arm numbness and weakness  History obtained from:  Patient     HPI:                                                                                                                                         [name] is an 61 y.o. female was seen back in 02/03/2017 at that time with a right facial droop and weakness 2 days and MRI which was negative. She was diagnosed with Bell's palsy and sent home with prednisone. Per patient since that period of time up to now her symptoms of right facial droop, right face numbness and difficulty swallowing and speaking have increased in addition she now has some right arm numbness. MRI was obtained as an outpatient which showed a subacute peripheral stroke on the left frontal lobe. For this reason patient was told to come to the emergency room and have a stroke workup. Patient admits to tobacco abuse and states that she does take aspirin however when her stomach is causing her problems she will not take aspirin at that time.  Date last known well: Unable to determine Time last known well: Unable to determine tPA Given: No: No exact last known normal   Modified Rankin: Rankin Score=0   Past Medical History:  Diagnosis Date  . 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 Chronic kidney disease    failure  . Heart attack ([place]) 2007  . Heart attack ([place]) 2007  . Hypertension   . Hypothyroidism   . ITP (idiopathic thrombocytopenic purpura) 06/17/2011  . Pulmonary nodule 10/10/2016  . Retroperitoneal lymphadenopathy 2012    Past Surgical History:  Procedure Laterality Date  . APPENDECTOMY     61 years old  . CESAREAN SECTION  1983 1989    Family History  Problem Relation Age of Onset  . Cancer Mother        lung  . Heart disease Father    Social History:  reports that she has been smoking Cigarettes.  She has a 12.50 pack-year smoking history. She has never used  smokeless tobacco. She reports that she drinks alcohol. She reports that she does not use drugs.  Allergies:  Allergies  Allergen Reactions  . Cefdinir Other (See Comments)    Causes vision problems  . Levofloxacin Itching  . Vicodin [Hydrocodone-Acetaminophen] Itching    Medications:  Current Facility-Administered Medications  Medication Dose Route Frequency Provider Last Rate Last Dose  .  stroke: mapping our early stages of recovery book   Does not apply Once Black, Karen M, NP      . 0.9 %  sodium chloride infusion   Intravenous Continuous Black, Lezlie Octave, NP      . acetaminophen (TYLENOL) tablet 650 mg  650 mg Oral Q4H PRN Radene Gunning, NP       Or  . acetaminophen (TYLENOL) solution 650 mg  650 mg Per Tube Q4H PRN Radene Gunning, NP       Or  . acetaminophen (TYLENOL) suppository 650 mg  650 mg Rectal Q4H PRN Black, Lezlie Octave, NP      . ALPRAZolam Duanne Moron) tablet 0.5 mg  0.5 mg Oral QHS PRN Radene Gunning, NP      . aspirin suppository 300 mg  300 mg Rectal Daily Black, Lezlie Octave, NP       Or  . aspirin tablet 325 mg  325 mg Oral Daily Black, Karen M, NP      . enoxaparin (LOVENOX) injection 40 mg  40 mg Subcutaneous Q24H Black, Karen M, NP      . Derrill Memo ON 03/10/2017] levothyroxine (SYNTHROID, LEVOTHROID) tablet 112 mcg  112 mcg Oral QAC breakfast Black, Karen M, NP      . metoprolol succinate (TOPROL-XL) 24 hr tablet 12.5 mg  12.5 mg Oral Daily Black, Karen M, NP      . senna-docusate (Senokot-S) tablet 1 tablet  1 tablet Oral QHS PRN Renard Hamper Lezlie Octave, NP       Current Outpatient Prescriptions  Medication Sig Dispense Refill  . ALPRAZolam (XANAX) 0.25 MG tablet Take 0.5 mg by mouth at bedtime as needed for sleep. For sleep    . aspirin 81 MG chewable tablet Chew 81 mg by mouth daily.    Marland Kitchen levothyroxine (SYNTHROID, LEVOTHROID) 112 MCG tablet Take 112 mcg by mouth  daily before breakfast.     . metoprolol succinate (TOPROL-XL) 25 MG 24 hr tablet Take 12.5 mg by mouth daily.      Facility-Administered Medications Ordered in Other Encounters  Medication Dose Route Frequency Provider Last Rate Last Dose  . sodium chloride 0.9 % injection 10 mL  10 mL Intracatheter PRN Curt Bears, MD         ROS:                                                                                                                                       History obtained from the patient  General ROS: negative for - chills, fatigue, fever, night sweats, weight gain or weight loss Psychological ROS: negative for - behavioral disorder, hallucinations, memory difficulties, mood swings or suicidal ideation Ophthalmic ROS: negative for - blurry vision, double vision, eye pain or loss of vision ENT ROS: negative for - epistaxis,  nasal discharge, oral lesions, sore throat, tinnitus or vertigo Allergy and Immunology ROS: negative for - hives or itchy/watery eyes Hematological and Lymphatic ROS: negative for - bleeding problems, bruising or swollen lymph nodes Endocrine ROS: negative for - galactorrhea, hair pattern changes, polydipsia/polyuria or temperature intolerance Respiratory ROS: negative for - cough, hemoptysis, shortness of breath or wheezing Cardiovascular ROS: negative for - chest pain, dyspnea on exertion, edema or irregular heartbeat Gastrointestinal ROS: negative for - abdominal pain, diarrhea, hematemesis, nausea/vomiting or stool incontinence Genito-Urinary ROS: negative for - dysuria, hematuria, incontinence or urinary frequency/urgency Musculoskeletal ROS: Positive for -  muscular weakness Neurological ROS: as noted in HPI Dermatological ROS: negative for rash and skin lesion changes  Neurologic Examination:                                                                                                      Blood pressure (!) 118/97, pulse 72, temperature 97.6 F  (36.4 C), temperature source Oral, resp. rate 18, height 5\' 3"  (1.6 m), weight 78 kg (172 lb), SpO2 99 %.  HEENT-  Normocephalic, no lesions, without obvious abnormality.  Normal external eye and conjunctiva.  Normal TM's bilaterally.  Normal auditory canals and external ears. Normal external nose, mucus membranes and septum.  Normal pharynx. Cardiovascular- S1, S2 normal, pulses palpable throughout   Lungs- chest clear, no wheezing, rales, normal symmetric air entry Abdomen- normal findings: bowel sounds normal Extremities- no edema Lymph-no adenopathy palpable Musculoskeletal-no joint tenderness, deformity or swelling Skin-warm and dry, no hyperpigmentation, vitiligo, or suspicious lesions  Neurological Examination Mental Status: Alert, oriented, thought content appropriate.  Speech dysarthric without evidence of aphasia.  Able to follow 3 step commands without difficulty. Cranial Nerves: II:  Visual fields grossly normal,  III,IV, VI: ptosis not present, extra-ocular motions intact bilaterally, pupils equal, round, reactive to light and accommodation V,VII: smile asymmetric with right facial droop, facial light touch sensation decreased on the right from nose to chin (V2-V3) VIII: hearing normal bilaterally IX,X: uvula rises symmetrically XI: bilateral shoulder shrug XII: midline tongue extension Motor: Right : Upper extremity   4/5    Left:     Upper extremity   5/5  Lower extremity   5/5     Lower extremity   5/5 Tone and bulk:normal tone throughout; no atrophy noted Sensory: Decreased sensation on the right arm. Of note she has chronic carpal tunnel which has caused chronic right hand numbness but she does have numbness throughout her whole arm Deep Tendon Reflexes: 2+ and symmetric throughout Plantars: Right: downgoing   Left: downgoing Cerebellar: normal finger-to-nose,and normal heel-to-shin test Gait: Not tested       Lab Results: Basic Metabolic Panel:  Recent  Labs Lab 03/09/17 1139  NA 140  K 4.3  CL 104  GLUCOSE 89  BUN 15  CREATININE 0.80    Liver Function Tests: No results for input(s): AST, ALT, ALKPHOS, BILITOT, PROT, ALBUMIN in the last 168 hours. No results for input(s): LIPASE, AMYLASE in the last 168 hours. No results for input(s): AMMONIA in the last 168 hours.  CBC:  Recent Labs Lab 03/09/17 1139  HGB 15.3*  HCT 45.0    Cardiac Enzymes: No results for input(s): CKTOTAL, CKMB, CKMBINDEX, TROPONINI in the last 168 hours.  Lipid Panel: No results for input(s): CHOL, TRIG, HDL, CHOLHDL, VLDL, LDLCALC in the last 168 hours.  CBG: No results for input(s): GLUCAP in the last 168 hours.  Microbiology: Results for orders placed or performed during the hospital encounter of 02/02/17  Culture, blood (routine x 2)     Status: None   Collection Time: 02/03/17 12:22 AM  Result Value Ref Range Status   Specimen Description BLOOD RIGHT ANTECUBITAL  Final   Special Requests   Final    BOTTLES DRAWN AEROBIC AND ANAEROBIC Blood Culture adequate volume   Culture   Final    NO GROWTH 5 DAYS Performed at [facility], [place] [address] [place], [place] [address]    Report Status 02/08/2017 FINAL  Final  Culture, blood (routine x 2)     Status: None   Collection Time: 02/03/17 12:22 AM  Result Value Ref Range Status   Specimen Description BLOOD LEFT FOREARM  Final   Special Requests   Final    BOTTLES DRAWN AEROBIC AND ANAEROBIC Blood Culture adequate volume   Culture   Final    NO GROWTH 5 DAYS Performed at [facility], [place] [address] [place], [place] [address]    Report Status 02/08/2017 FINAL  Final    Coagulation Studies: No results for input(s): LABPROT, INR in the last 72 hours.  Imaging: Mr Jeri Cos LZ Contrast  Result Date: 03/08/2017  [facility] [address], [place], [place] [address] [phone] NEUROIMAGING REPORT STUDY DATE: 03/08/2017 PATIENT NAME: [name]  DOB: 02/04/1956 MRN: [id number] EXAM: MRI Brain with and without contrast ORDERING CLINICIAN: Asencion Partridge Dohmeier M.D. CLINICAL HISTORY: 61 year old woman with right facial droop, clumsiness and right arm weakness COMPARISON FILMS: 01/27/2017 TECHNIQUE:MRI of the brain with and without contrast was obtained utilizing 5 mm axial slices with T1, T2, T2 flair, SWI and diffusion weighted views.  T1 sagittal, T2 coronal and postcontrast views in the axial and coronal plane were obtained. CONTRAST:  Mult[facility]ance IMAGING SITE: [facility], [place]. FINDINGS: On sagittal images, the spinal cord is imaged caudally to C2-C3 and is normal in caliber.   The contents of the posterior fossa are of normal size and position.   The pituitary gland has reduced height inside of a normal sized sella turcica this is also seen on previous MRIs.    Brain volume appears normal.   The ventricles are normal in size for ageand without distortion.  There are no abnormal extra-axial collections of fluid.  The cerebellum and brainstem appears normal.   The deep gray matter appears normal.  There is a patchy acute infarction is hyperintense on diffusion-weighted images in the left frontal lobe adjacent to a wedge-shaped chronic infarction (that was also seen on the 01/27/2017 MRI but not on the 09/15/2010 MRI).   Mild chronic heme products are noted on the susceptibility weighted images. These were not noted on the 01/27/2017 MRI.    There is one small T1 hyperintense focus that could represent a subacute focus (2-7 days).   After the infusion of contrast, there is patchy enhancement in the region of the acute stroke.    Also in the hemispheres, there are a few scattered non-enhancing T2/FLAIR hyperintense foci consistent with mild chronic microvascular ischemic change, unchanged  since the previous MRI.   The orbits appear normal.   The VIIth/VIIIth nerve complex appears normal.  The mastoid air cells appear normal.  The paranasal  sinuses appear normal.  Flow voids are identified within the major intracerebral arteries.     This MRI of the brain with and without contrast shows the following: 1.   Subacute peripheral stroke left frontal lobe adjacent to a more chronic stroke that was noted on the 01/27/2017 MRI. There are some heme products within the subacute stroke. It also enhances. The stroke could be due to an embolic etiology involving the left middle cerebral. Vasculitis is also in the differential diagnosis. 2.   Elsewhere there is mild chronic microvascular ischemic change. INTERPRETING PHYSICIAN: Richard A. Felecia Shelling, MD, PhD, FAAN Certified in  Neuroimaging by [employer]   Mr Cervical Spine W Wo Contrast  Result Date: 03/08/2017  [facility] [address], [place], [place] [address] [phone] NEUROIMAGING REPORT STUDY DATE: 03/08/2017 PATIENT NAME: [name] DOB: 07-16-1956 MRN: [id number] EXAM: MRI of the cervical spine without contrast ORDERING CLINICIAN: Asencion Partridge Dohmeier M.D. CLINICAL HISTORY: 61 year old woman with right arm weakness and clumsiness COMPARISON FILMS: None TECHNIQUE: MRI of the cervical spine was obtained utilizing 3 mm sagittal slices from the posterior fossa down to the T3-4 level with T1, T2 and inversion recovery views. In addition 4 mm axial slices from K4-8 down to T1-2 level were included with T2 and gradient echo views.   The study was ordered with and without contrast but no contrasted images are available CONTRAST: 15 mL MultiHance IMAGING SITE: [facility], [place], [place], [place] FINDINGS: :  On sagittal images, the spine is imaged from above the cervicomedullary junction to T2.   The spinal cord is of normal caliber and signal.   There is minimal anterolisthesis of C7 upon T1..    The vertebral bodies have normal signal.  The discs and interspaces were further evaluated on axial views from C2 to T1 as follows: C2 - C3:  There is  minimal central disc bulging that does not lead to any nerve root compression.. C3 - C4:  The disc and interspace appear normal. C4 - C5:  The disc and interspace appear normal. C5 - C6:  The disc and interspace appear normal. C6 - C7:  There is central disc protrusion that slightly distorts the thecal sac without causing spinal stenosis or cord compression. Neural foramina are widely patent and there is no nerve root compression.. C7 - T1:   On sagittal images, there appears to be minimal anterolisthesis of C7 upon T1. The disc and interspace appear normal on the axial images. There is no nerve root compression.    This MRI of the cervical spine without contrast shows the following: 1.   The spinal cord appears normal. 2.   There are minimal to mild degenerative changes noted at C2-C3, C6-C7 and C7-T1 that do not lead to any nerve root compression. INTERPRETING PHYSICIAN: Richard A. Felecia Shelling, MD, PhD, FAAN Certified in  Neuroimaging by [employer]       Assessment and plan discussed with with attending physician and they are in agreement.    Etta Quill PA-C [facility] [phone]  03/09/2017, 11:44 AM   Assessment: 61 y.o. female with subacute left frontal lobe infarct causing symptoms of right facial droop, dysarthria, right arm and facial paresthesia and mild weakness of right arm. At this time given patient's multiple stroke risk  factors patient would benefit from a full stroke workup. She describes symptoms since June 29, but when I saw her in early July, her facial droop was extremely mild and an asymmetry in her blinking made me suspect upper  facial nerve involvement as well. She has worsened in the interim, I think this is supported by the appearance on MRI.  Stroke Risk Factors - hyperlipidemia, hypertension and smoking  Recommend 1. HgbA1c, fasting lipid panel 2. MRa of the brain without contrast 3. PT consult, OT consult, Speech consult 4.  Echocardiogram 5. 80 mg of Atorvistatin 6. Prophylactic therapy-Antiplatelet med: Aspirin 325 mg daily  7. Risk factor modification 8. Telemetry monitoring 9. Frequent neuro checks 10 NPO until passes stroke swallow screen 11 please page stroke NP  Or  PA  Or MD from 8am -4 pm  as this patient from this time will be  followed by the stroke.   You can look them up on [email or web address]  Password [email or web address]    Roland Rack, MD [facility] [phone]  If 7pm- 7am, please page neurology on call as listed in [place].

## 2017-03-09 NOTE — Clinical Social Work Note (Signed)
Clinical Social Work Assessment  Patient Details  Name: Bryelle Spiewak MRN: [id number] Date of Birth: [birth year]  Date of referral:  03/09/17               Reason for consult:  Other (Comment Required)                Permission sought to share information with:  Family Supports Permission granted to share information::  Yes, Verbal Permission Granted  Name::     Barbarajean Kinzler  (husband. )  Agency::     Relationship::     Contact Information:     Housing/Transportation Living arrangements for the past 2 months:  Single Family Home (with husband.) Source of Information:  Spouse Patient Interpreter Needed:  None Criminal Activity/Legal Involvement Pertinent to Current Situation/Hospitalization:    Significant Relationships:  Adult Children, Spouse Lives with:  Adult Children Do you feel safe going back to the place where you live?  Yes Need for family participation in patient care:  Yes (Comment)  Care giving concerns:  CSW spoke with pt's husband via phone to gather information on pt. Pt's husband inforemd CSW that pt's speech is not clear so talking to him was best at this time. CSW was made aware that pt's husband currently has no concerns, other than getting back home.    Social Worker assessment / plan:  CSW spoke with pt's husband Lanny Hurst) via phone. Lanny Hurst informed CSW that pt has lived at home with him for the past 35 years. Husband also informed CSW that he is pt's primary care taker if and when pt needs anything which is rare per his report. Husband did express interest in therapy if pt needs it once discharged. He mentioned that pt may not be as agreeable but they will discuss it as time gets closer. CSW was also informed that pt and husband have 2 children who live in [place] and will help take care if they need anything. Pt is not involved in church much per husband report.   Employment status:  Other (Comment) Insurance information:  Managed Medicare PT Recommendations:   Not assessed at this time Information / Referral to community resources:  [place]  Patient/Family's Response to care:  Pt's husband is agreeable and wiling to help pt get what pt needs in order to return back to self so that pt can return home.   Patient/Family's Understanding of and Emotional Response to Diagnosis, Current Treatment, and Prognosis:  No further questions or concerns have been presented at this time.   Emotional Assessment Appearance:    Attitude/Demeanor/Rapport:  Unable to Assess (spoke with husband via phone. ) Affect (typically observed):  Unable to Assess Orientation:   (spoke with husband via phone. ) Alcohol / Substance use:  Not Applicable Psych involvement (Current and /or in the community):  No (Comment)  Discharge Needs  Concerns to be addressed:  No discharge needs identified Readmission within the last 30 days:  No Current discharge risk:  None Barriers to Discharge:  No Barriers Identified   Wetzel Bjornstad, [place] 03/09/2017, 11:18 AM

## 2017-03-09 NOTE — Telephone Encounter (Signed)
Called and spoke with the patient's husband who is aware that yesterday after pt went for her MRI of head it was noted that she had a stroke. Dr dohmeier discussed this information with both the patient and her husband. We can't direct admit to hospital but I can leave a message with the admitting stroke doc to make them aware that she will be coming in thru ED to be on look out for her. Page was sent to Dr Leonel Ramsay to make aware. Husband verbalized understanding and per instructions is going to take patient in thru the ED at [facility]. I also called and let the ED triage area aware to be on lookout for her.

## 2017-03-09 NOTE — ED Provider Notes (Signed)
[facility] DEPT Provider Note   CSN: [id number] Arrival date & time: 03/09/17  1779      History   Chief Complaint Chief Complaint  Patient presents with  . Cerebrovascular Accident    HPI Talibah Colasurdo is a 61 y.o. female.  Patient with history of Castleman's disease, CKD, HTN, Hypothyroidism, CAD s/p MI in 2007 presents with subacute stroke seen on MRI yesterday at neurologist's office. She has had R facial droop since 6/27, She came to the ED on 6/29 where CT and MRI were negative for acute stroke with old infarct seen and she was diagnosed with Bell's Palsy and discharged home with valacyclovir and prednisone. She was admitted on 7/5 with chololithiasis. She was seen by neurology during her admission who agreed with Bell's palsy diagnosis, with expectation that she would see improvement in the follow weeks. Her symptoms did not improve, however, and she got a repeat MRI outpatient on 8/8 which did show subacute on chronic L frontal lobe stroke.  Patient also has a history of Right hand pain and weakness starting in Sept. 2017. She initially receive cortisone injections, which improved her pain until March. She continued to have problems with the R hand and a nerve conduction study was performed, which was normal. She received a neck MRI 8/8 to evaluates for proximal etiologies.      Past Medical History:  Diagnosis Date  . Castleman's disease ([place])   . Chronic kidney disease    failure  . Heart attack ([place]) 2007  . Heart attack ([place]) 2007  . Hypertension   . Hypothyroidism   . ITP (idiopathic thrombocytopenic purpura) 06/17/2011  . Pulmonary nodule 10/10/2016  . Retroperitoneal lymphadenopathy 2012    Patient Active Problem List   Diagnosis Date Noted  . CVA (cerebral vascular accident) ([place]) 03/09/2017  . Clumsiness on examination 02/23/2017  . Facial droop 02/23/2017  . Carpal tunnel syndrome on right 02/23/2017  . Right upper quadrant abdominal pain 02/03/2017    . Bell's palsy 02/03/2017  . Leukocytosis 02/03/2017  . Pulmonary nodule 10/10/2016  . Castleman's disease ([place]) 06/21/2011  . Idiopathic thrombocytopenic purpura ([place]) 06/17/2011  . Castleman disease ([place]) 06/13/2011  . Hypothyroidism 10/08/2010  . UNSPECIFIED DISORDER OF ADRENAL GLANDS 10/08/2010  . Coronary atherosclerosis 10/08/2010  . CONSTIPATION 10/08/2010  . Enlarged lymph nodes 10/08/2010    Past Surgical History:  Procedure Laterality Date  . APPENDECTOMY     61 years old  . [place]    OB History    No data available       Home Medications    Prior to Admission medications   Medication Sig Start Date End Date Taking? Authorizing Provider  ALPRAZolam Duanne Moron) 0.25 MG tablet Take 0.5 mg by mouth at bedtime as needed for sleep. For sleep    [provider]  aspirin 81 MG chewable tablet Chew 81 mg by mouth daily.    [provider]  levothyroxine (SYNTHROID, LEVOTHROID) 112 MCG tablet Take 112 mcg by mouth daily before breakfast.     [provider]  metoprolol succinate (TOPROL-XL) 25 MG 24 hr tablet Take 12.5 mg by mouth daily.     [provider]    Family History Family History  Problem Relation Age of Onset  . Cancer Mother        lung  . Heart disease Father     Social History Social History  Substance Use Topics  . Smoking status: Current Every Day  Smoker    Packs/day: 0.50    Years: 25.00    Types: Cigarettes  . Smokeless tobacco: Never Used     Comment: vapor cig sometimes  . Alcohol use Yes     Comment: "occasssionally"     Allergies   Cefdinir; Levofloxacin; and Vicodin [hydrocodone-acetaminophen]   Review of Systems Review of Systems  Constitutional: Negative for chills and fever.  HENT: Negative for ear pain and sore throat.   Eyes: Negative for pain and visual disturbance.  Respiratory: Negative for cough and shortness of breath.   Cardiovascular: Negative for chest pain  and palpitations.  Gastrointestinal: Negative for abdominal pain and vomiting.  Genitourinary: Negative for dysuria and hematuria.  Musculoskeletal: Negative for arthralgias and back pain.  Skin: Negative for color change and rash.  Neurological: Negative for seizures and syncope.  All other systems reviewed and are negative.    Physical Exam Updated Vital Signs BP (!) 132/93   Pulse (!) 53   Temp 97.6 F (36.4 C) (Oral)   Resp 15   Ht 5\' 3"  (1.6 m)   Wt 78 kg (172 lb)   SpO2 99%   BMI 30.47 kg/m   Physical Exam  Constitutional: She appears well-developed and well-nourished. No distress.  HENT:  Head: Normocephalic and atraumatic.  Eyes: Conjunctivae are normal.  Neck: Neck supple.  Cardiovascular: Normal rate and regular rhythm.   No murmur heard. Pulmonary/Chest: Effort normal and breath sounds normal. No respiratory distress.  Abdominal: Soft. There is no tenderness.  Musculoskeletal: She exhibits no edema.  Neurological: She is alert.  R Facial droop  Tongue deviates to the R Decreased sensation @ R CNV2-3 Decreased R grip strength  Skin: Skin is warm and dry.  Psychiatric: She has a normal mood and affect.  Nursing note and vitals reviewed.    ED Treatments / Results  Labs (all labs ordered are listed, but only abnormal results are displayed) Labs Reviewed  PROTIME-INR  APTT  CBC  DIFFERENTIAL  COMPREHENSIVE METABOLIC PANEL  HIV ANTIBODY (ROUTINE TESTING)  HEMOGLOBIN A1C  LIPID PANEL  I-STAT TROPONIN, ED  CBG MONITORING, ED  I-STAT CHEM 8, ED    EKG  EKG Interpretation  Date/Time:  Thursday March 09 2017 09:37:18 EDT Ventricular Rate:  75 PR Interval:    QRS Duration: 97 QT Interval:  386 QTC Calculation: 432 R Axis:   78 Text Interpretation:  Sinus rhythm Probable left atrial enlargement Baseline wander in lead(s) II III aVL aVF V1 V2 V3 V5 No STEMI.  Confirmed by Nanda Quinton [phone]) on 03/09/2017 10:13:33 AM       Radiology Mr Jeri Cos [facility] Contrast  Result Date: 03/08/2017  [facility] [address], [place], [place] [address] [phone] NEUROIMAGING REPORT STUDY DATE: 03/08/2017 PATIENT NAME: Hanadi Stanly DOB: [birth year] MRN: [id number] EXAM: MRI Brain with and without contrast ORDERING CLINICIAN: Asencion Partridge Dohmeier M.D. CLINICAL HISTORY: 61 year old woman with right facial droop, clumsiness and right arm weakness COMPARISON FILMS: 01/27/2017 TECHNIQUE:MRI of the brain with and without contrast was obtained utilizing 5 mm axial slices with T1, T2, T2 flair, SWI and diffusion weighted views.  T1 sagittal, T2 coronal and postcontrast views in the axial and coronal plane were obtained. CONTRAST:  [facility] IMAGING SITE: [facility], [place]. FINDINGS: On sagittal images, the spinal cord is imaged caudally to C2-C3 and is normal in caliber.   The contents of the posterior fossa are of normal size and position.  The pituitary gland has reduced height inside of a normal sized sella turcica this is also seen on previous MRIs.    Brain volume appears normal.   The ventricles are normal in size for ageand without distortion.  There are no abnormal extra-axial collections of fluid.  The cerebellum and brainstem appears normal.   The deep gray matter appears normal.  There is a patchy acute infarction is hyperintense on diffusion-weighted images in the left frontal lobe adjacent to a wedge-shaped chronic infarction (that was also seen on the 01/27/2017 MRI but not on the 09/15/2010 MRI).   Mild chronic heme products are noted on the susceptibility weighted images. These were not noted on the 01/27/2017 MRI.    There is one small T1 hyperintense focus that could represent a subacute focus (2-7 days).   After the infusion of contrast, there is patchy enhancement in the region of the acute stroke.    Also in the hemispheres, there are a few scattered non-enhancing T2/FLAIR hyperintense foci consistent with mild  chronic microvascular ischemic change, unchanged since the previous MRI.   The orbits appear normal.   The VIIth/VIIIth nerve complex appears normal.  The mastoid air cells appear normal.  The paranasal sinuses appear normal.  Flow voids are identified within the major intracerebral arteries.     This MRI of the brain with and without contrast shows the following: 1.   Subacute peripheral stroke left frontal lobe adjacent to a more chronic stroke that was noted on the 01/27/2017 MRI. There are some heme products within the subacute stroke. It also enhances. The stroke could be due to an embolic etiology involving the left middle cerebral. Vasculitis is also in the differential diagnosis. 2.   Elsewhere there is mild chronic microvascular ischemic change. INTERPRETING PHYSICIAN: Richard A. Felecia Shelling, MD, PhD, FAAN Certified in  Neuroimaging by [employer]   Mr Cervical Spine W Wo Contrast  Result Date: 03/08/2017  [facility] [address], [place], [place] [address] [phone] NEUROIMAGING REPORT STUDY DATE: 03/08/2017 PATIENT NAME: Ashlynne Shetterly DOB: [birth year] MRN: [id number] EXAM: MRI of the cervical spine without contrast ORDERING CLINICIAN: Asencion Partridge Dohmeier M.D. CLINICAL HISTORY: 61 year old woman with right arm weakness and clumsiness COMPARISON FILMS: None TECHNIQUE: MRI of the cervical spine was obtained utilizing 3 mm sagittal slices from the posterior fossa down to the T3-4 level with T1, T2 and inversion recovery views. In addition 4 mm axial slices from J3-3 down to T1-2 level were included with T2 and gradient echo views.   The study was ordered with and without contrast but no contrasted images are available CONTRAST: 15 mL [facility] IMAGING SITE: [facility], [place], [place], [place] FINDINGS: :  On sagittal images, the spine is imaged from above the cervicomedullary junction to T2.   The spinal cord is of normal caliber and signal.    There is minimal anterolisthesis of C7 upon T1..    The vertebral bodies have normal signal.  The discs and interspaces were further evaluated on axial views from C2 to T1 as follows: C2 - C3:  There is minimal central disc bulging that does not lead to any nerve root compression.. C3 - C4:  The disc and interspace appear normal. C4 - C5:  The disc and interspace appear normal. C5 - C6:  The disc and interspace appear normal. C6 - C7:  There is central disc protrusion that slightly distorts the thecal sac without causing spinal stenosis or cord  compression. Neural foramina are widely patent and there is no nerve root compression.. C7 - T1:   On sagittal images, there appears to be minimal anterolisthesis of C7 upon T1. The disc and interspace appear normal on the axial images. There is no nerve root compression.    This MRI of the cervical spine without contrast shows the following: 1.   The spinal cord appears normal. 2.   There are minimal to mild degenerative changes noted at C2-C3, C6-C7 and C7-T1 that do not lead to any nerve root compression. INTERPRETING PHYSICIAN: Richard A. Felecia Shelling, MD, PhD, FAAN Certified in  Neuroimaging by [employer]    Procedures Procedures (including critical care time)  Medications Ordered in ED Medications  LORazepam (ATIVAN) tablet 0.5 mg (not administered)  levothyroxine (SYNTHROID, LEVOTHROID) tablet 112 mcg (not administered)  ALPRAZolam (XANAX) tablet 0.5 mg (not administered)  metoprolol succinate (TOPROL-XL) 24 hr tablet 12.5 mg (not administered)   stroke: mapping our early stages of recovery book (not administered)  0.9 %  sodium chloride infusion (not administered)  acetaminophen (TYLENOL) tablet 650 mg (not administered)    Or  acetaminophen (TYLENOL) solution 650 mg (not administered)    Or  acetaminophen (TYLENOL) suppository 650 mg (not administered)  senna-docusate (Senokot-S) tablet 1 tablet (not administered)  enoxaparin  (LOVENOX) injection 40 mg (not administered)  aspirin suppository 300 mg (not administered)    Or  aspirin tablet 325 mg (not administered)     Initial Impression / Assessment and Plan / ED Course  I have reviewed the triage vital signs and the nursing notes.  Pertinent labs & imaging results that were available during my care of the patient were reviewed by me and considered in my medical decision making (see chart for details).    Patient with subacute stroke seen on outpatient MRI ordered by neurology. Here for admission and neurologic work up. Labs ordered and pending at time of admission below - CBG: - Troponin: - PT-INR: - APTT: - CBC: - Diff: - CMP: - Chem 8:  Neurology consulted. Hospitalist consulted and patient to be admitted by [facility].   Final Clinical Impressions(s) / ED Diagnoses   Final diagnoses:  Acute CVA (cerebrovascular accident) [facility])    New Prescriptions New Prescriptions   No medications on file     Neva Seat, MD 03/09/17 1122    Margette Fast, MD 03/09/17 1550

## 2017-03-09 NOTE — H&P (Signed)
History and Physical    [name] XTK:[id number] DOB: [birth year] DOA: 03/09/2017  PCP: [name], MD Patient coming from: home  Chief Complaint: right facial droop slurred speech  HPI: [name] is a 61 y.o. female with medical history significant for hypertension, hyperlipidemia, ITP, Castleman's disease presents to the emergency department from home on the instructions of her neurologist with a chief complaint of stroke.  Information is obtained from the patient and the chart. This indicate that on June 29 she was seen in the emergency department with chief complaint of right-sided facial weakness and slurred speech. At that time she had a negative MRI and ultimately was diagnosed with Bell's palsy. She was discharged home with prednisone and valacyclovir. She states that in spite of taking his medications her symptoms persisted. Since that time she was admitted to the hospital with right upper quadrant pain (6 weeks ago) and during that stay was evaluated by neurology who opined no clear evidence of parotid involvement on her brain via MRI. She states her symptoms have persisted. She saw Dr. Roddie Mc on July 26 who opined an old left frontal lobe infarct without acute intracranial abnormality on CT. She had a repeat MRI yesterday revealing  Subacute peripheral stroke left frontal lobe adjacent to a more chronic stroke that was noted on the 01/27/2017 MRI. There are some heme products within the subacute stroke. It also enhances. The stroke could be due to an embolic etiology involving the left middle cerebral. Vasculitis is also in the differential diagnosis. We are asked to admit. She denies headache visual disturbances numbness tingling of her extremities. She does have some neuropathy type symptoms in her right hand/arm and has had them since she had carpal tunnel surgery months ago. She denies chest pain palpitation shortness of breath. She denies abdominal pain nausea vomiting  diarrhea constipation melena bright red blood per rectum. She denies dysuria hematuria frequency or urgency. She denies any lower extremity edema or orthopnea.    ED Course: In the emergency department she's afebrile hemodynamically stable and not hypoxic  Review of Systems: As per HPI otherwise all other systems reviewed and are negative.   Ambulatory Status: She ambulates independently. She denies any unsteady gait or balance issues she is independent with ADLs  Past Medical History:  Diagnosis Date  . Castleman's disease ([place])   . Chronic kidney disease    failure  . Heart attack ([place]) 2007  . Heart attack ([place]) 2007  . Hypertension   . Hypothyroidism   . ITP (idiopathic thrombocytopenic purpura) 06/17/2011  . Pulmonary nodule 10/10/2016  . Retroperitoneal lymphadenopathy 2012    Past Surgical History:  Procedure Laterality Date  . APPENDECTOMY     61 years old  . [place]    Social History   Social History  . Marital status: Married    Spouse name: N/A  . Number of children: N/A  . Years of education: N/A   Occupational History  . Not on file.   Social History Main Topics  . Smoking status: Current Every Day Smoker    Packs/day: 0.50    Years: 25.00    Types: Cigarettes  . Smokeless tobacco: Never Used     Comment: vapor cig sometimes  . Alcohol use Yes     Comment: "occasssionally"  . Drug use: No  . Sexual activity: Not Currently   Other Topics Concern  . Not on file   Social History Narrative  . No narrative on  file    Allergies  Allergen Reactions  . Cefdinir Other (See Comments)    Causes vision problems  . Levofloxacin Itching  . Vicodin [Hydrocodone-Acetaminophen] Itching    Family History  Problem Relation Age of Onset  . Cancer Mother        lung  . Heart disease Father     Prior to Admission medications   Medication Sig Start Date End Date Taking? Authorizing Provider  ALPRAZolam Duanne Moron) 0.25 MG tablet Take  0.5 mg by mouth at bedtime as needed for sleep. For sleep    [provider]  aspirin 81 MG chewable tablet Chew 81 mg by mouth daily.    [provider]  levothyroxine (SYNTHROID, LEVOTHROID) 112 MCG tablet Take 112 mcg by mouth daily before breakfast.     [provider]  metoprolol succinate (TOPROL-XL) 25 MG 24 hr tablet Take 12.5 mg by mouth daily.     [provider]    Physical Exam: Vitals:   03/09/17 0945 03/09/17 1015 03/09/17 1100 03/09/17 1130  BP: 126/81 (!) 144/97 (!) 132/93 (!) 118/97  Pulse: 65 (!) 55 (!) 53 72  Resp: (!) 22 18 15 18   Temp:      TempSrc:      SpO2: 98% 99% 99% 99%  Weight:      Height:         General:  Appears calm and comfortable Eyes:  PERRL, EOMI, normal lids, iris ENT:  grossly normal hearing, lips & tongue, His membranes of her mouth are moist and pink very poor dentition Neck:  no LAD, masses or thyromegaly Cardiovascular:  RRR, no m/r/g. No LE edema.  Respiratory: Normal effort breath sounds somewhat distant hear no wheezes no crackles Abdomen:  soft, ntnd, positive bowel sounds throughout no guarding or rebounding Skin:  no rash or induration seen on limited exam Musculoskeletal:  grossly normal tone BUE/BLE, good ROM, no bony abnormality Psychiatric:  grossly normal mood and affect, speech fluent and appropriate, AOx3 Neurologic:  Right facial droop speech is slurred tongue midline bilateral grip 4 out of 5 on the right 5 out of 5 on the left lower extremity strength 5 out of 5 bilaterally  Labs on Admission: I have personally reviewed following labs and imaging studies  CBC:  Recent Labs Lab 03/09/17 1139  HGB 15.3*  HCT 93.7   Basic Metabolic Panel:  Recent Labs Lab 03/09/17 1139  NA 140  K 4.3  CL 104  GLUCOSE 89  BUN 15  CREATININE 0.80   GFR: Estimated Creatinine Clearance: 74.8 mL/min (by C-G formula based on SCr of 0.8 mg/dL). Liver Function Tests: No results for input(s):  AST, ALT, ALKPHOS, BILITOT, PROT, ALBUMIN in the last 168 hours. No results for input(s): LIPASE, AMYLASE in the last 168 hours. No results for input(s): AMMONIA in the last 168 hours. Coagulation Profile:  Recent Labs Lab 03/09/17 1124  INR 1.02   Cardiac Enzymes: No results for input(s): CKTOTAL, CKMB, CKMBINDEX, TROPONINI in the last 168 hours. BNP (last 3 results) No results for input(s): PROBNP in the last 8760 hours. HbA1C: No results for input(s): HGBA1C in the last 72 hours. CBG: No results for input(s): GLUCAP in the last 168 hours. Lipid Profile: No results for input(s): CHOL, HDL, LDLCALC, TRIG, CHOLHDL, LDLDIRECT in the last 72 hours. Thyroid Function Tests: No results for input(s): TSH, T4TOTAL, FREET4, T3FREE, THYROIDAB in the last 72 hours. Anemia Panel: No results for input(s): VITAMINB12, FOLATE, FERRITIN, TIBC, IRON,  RETICCTPCT in the last 72 hours. Urine analysis:    Component Value Date/Time   COLORURINE YELLOW 02/02/2017 1516   APPEARANCEUR CLEAR 02/02/2017 1516   LABSPEC 1.015 02/02/2017 1516   PHURINE 5.0 02/02/2017 1516   GLUCOSEU NEGATIVE 02/02/2017 1516   HGBUR NEGATIVE 02/02/2017 1516   BILIRUBINUR NEGATIVE 02/02/2017 1516   KETONESUR NEGATIVE 02/02/2017 1516   PROTEINUR NEGATIVE 02/02/2017 1516   UROBILINOGEN 0.2 05/29/2011 1728   NITRITE NEGATIVE 02/02/2017 1516   LEUKOCYTESUR TRACE (A) 02/02/2017 1516    Creatinine Clearance: Estimated Creatinine Clearance: 74.8 mL/min (by C-G formula based on SCr of 0.8 mg/dL).  Sepsis Labs: @LABRCNTIP (procalcitonin:4,lacticidven:4) )No results found for this or any previous visit (from the past 240 hour(s)).   Radiological Exams on Admission: Dg Chest 2 View  Result Date: 03/09/2017 CLINICAL DATA:  Recent stroke history EXAM: CHEST  2 VIEW COMPARISON:  02/03/2017 FINDINGS: Cardiac shadow is within normal limits. The lungs are well aerated bilaterally. No focal infiltrate or sizable effusion is seen. No  acute bony abnormality is noted. IMPRESSION: No active cardiopulmonary disease. Electronically Signed   By: Inez Catalina M.D.   On: 03/09/2017 11:44   Mr Jeri Cos WE Contrast  Result Date: 03/08/2017  [facility] [address] [place], [place] [address] [phone] NEUROIMAGING REPORT STUDY DATE: 03/08/2017 PATIENT NAME: Shailah Gibbins DOB: Aug 04, 1955 MRN: [id number] EXAM: MRI Brain with and without contrast ORDERING CLINICIAN: Asencion Partridge Dohmeier M.D. CLINICAL HISTORY: 61 year old woman with right facial droop, clumsiness and right arm weakness COMPARISON FILMS: 01/27/2017 TECHNIQUE:MRI of the brain with and without contrast was obtained utilizing 5 mm axial slices with T1, T2, T2 flair, SWI and diffusion weighted views.  T1 sagittal, T2 coronal and postcontrast views in the axial and coronal plane were obtained. CONTRAST:  [facility] IMAGING SITE: [facility], [place]. FINDINGS: On sagittal images, the spinal cord is imaged caudally to C2-C3 and is normal in caliber.   The contents of the posterior fossa are of normal size and position.   The pituitary gland has reduced height inside of a normal sized sella turcica this is also seen on previous MRIs.    Brain volume appears normal.   The ventricles are normal in size for ageand without distortion.  There are no abnormal extra-axial collections of fluid.  The cerebellum and brainstem appears normal.   The deep gray matter appears normal.  There is a patchy acute infarction is hyperintense on diffusion-weighted images in the left frontal lobe adjacent to a wedge-shaped chronic infarction (that was also seen on the 01/27/2017 MRI but not on the 09/15/2010 MRI).   Mild chronic heme products are noted on the susceptibility weighted images. These were not noted on the 01/27/2017 MRI.    There is one small T1 hyperintense focus that could represent a subacute focus (2-7 days).   After the infusion of contrast, there is patchy  enhancement in the region of the acute stroke.    Also in the hemispheres, there are a few scattered non-enhancing T2/FLAIR hyperintense foci consistent with mild chronic microvascular ischemic change, unchanged since the previous MRI.   The orbits appear normal.   The VIIth/VIIIth nerve complex appears normal.  The mastoid air cells appear normal.  The paranasal sinuses appear normal.  Flow voids are identified within the major intracerebral arteries.     This MRI of the brain with and without contrast shows the following: 1.   Subacute peripheral stroke left frontal lobe adjacent to  a more chronic stroke that was noted on the 01/27/2017 MRI. There are some heme products within the subacute stroke. It also enhances. The stroke could be due to an embolic etiology involving the left middle cerebral. Vasculitis is also in the differential diagnosis. 2.   Elsewhere there is mild chronic microvascular ischemic change. INTERPRETING PHYSICIAN: Richard A. Felecia Shelling, MD, PhD, FAAN Certified in  Neuroimaging by [employer]   Mr Cervical Spine W Wo Contrast  Result Date: 03/08/2017  [facility] [address] [place], [place] [address] [phone] NEUROIMAGING REPORT STUDY DATE: 03/08/2017 PATIENT NAME: Jennyfer Nickolson DOB: 08-Aug-1955 MRN: [id number] EXAM: MRI of the cervical spine without contrast ORDERING CLINICIAN: Asencion Partridge Dohmeier M.D. CLINICAL HISTORY: 61 year old woman with right arm weakness and clumsiness COMPARISON FILMS: None TECHNIQUE: MRI of the cervical spine was obtained utilizing 3 mm sagittal slices from the posterior fossa down to the T3-4 level with T1, T2 and inversion recovery views. In addition 4 mm axial slices from H6-0 down to T1-2 level were included with T2 and gradient echo views.   The study was ordered with and without contrast but no contrasted images are available CONTRAST: 15 mL [facility] IMAGING SITE: [facility], [place],  [place], [place] FINDINGS: :  On sagittal images, the spine is imaged from above the cervicomedullary junction to T2.   The spinal cord is of normal caliber and signal.   There is minimal anterolisthesis of C7 upon T1..    The vertebral bodies have normal signal.  The discs and interspaces were further evaluated on axial views from C2 to T1 as follows: C2 - C3:  There is minimal central disc bulging that does not lead to any nerve root compression.. C3 - C4:  The disc and interspace appear normal. C4 - C5:  The disc and interspace appear normal. C5 - C6:  The disc and interspace appear normal. C6 - C7:  There is central disc protrusion that slightly distorts the thecal sac without causing spinal stenosis or cord compression. Neural foramina are widely patent and there is no nerve root compression.. C7 - T1:   On sagittal images, there appears to be minimal anterolisthesis of C7 upon T1. The disc and interspace appear normal on the axial images. There is no nerve root compression.    This MRI of the cervical spine without contrast shows the following: 1.   The spinal cord appears normal. 2.   There are minimal to mild degenerative changes noted at C2-C3, C6-C7 and C7-T1 that do not lead to any nerve root compression. INTERPRETING PHYSICIAN: Richard A. Felecia Shelling, MD, PhD, FAAN Certified in  Neuroimaging by [employer]   EKG: Independently reviewed. Sinus rhythm Probable left atrial enlargement Baseline wander in lead(s) II III aVL aVF V1 V2 V3 V5 Assessment/Plan Principal Problem:   CVA (cerebral vascular accident) ([place]) Active Problems:   Hypothyroidism   Coronary atherosclerosis   Idiopathic thrombocytopenic purpura (HCC)   Bell's palsy   Facial droop   Hypertension   Right arm weakness   #1. CVA. Risk factors include smoking, hyperlipidemia, hypertension. Symptoms since June. MRI as noted above. -Admit to telemetry -MRA -Carotid Doppler and 2-D echo -Chest x-ray -Lipid panel and  hemoglobin A1c -Aspirin and statin -Nothing by mouth until she passes the bedside swallow eval -Await neurology recommendations  #2. Bell's palsy. Diagnosed 6 weeks ago. Completed a prednisone taper. -See #1  #3. Hypothyroidism. TSH in June was 1.07 -Continue home meds  #  4. Hypertension. [place] control emergency department. Home medications include metoprolol 12.5.  5. Right arm weakness.  explained neuropathy. Has had these symptoms since before her carpal tunnel surgery. Chart review indicates he had no conduction study 7 days ago. Per chart review this was an abnormal study demonstrating mild bilateral median neuropathies at the wrist consistent with mild bilateral carpal tunnel syndrome. -See #1  DVT prophylaxis: lovenox  Code Status: full  Family Communication: none present  Disposition Plan: home  Consults called:  Dr Leonel Ramsay Admission status: obs    Radene Gunning MD [facility]  If 7PM-7AM, please contact night-coverage [email or web address] Password [email or web address]  03/09/2017, 12:26 PM

## 2017-03-09 NOTE — ED Notes (Signed)
Report attempted 

## 2017-03-09 NOTE — ED Triage Notes (Signed)
Pt reports R sided facial droop with slurred speech since June 27 with dx of Bells Palsy.  Pt, reports had an MRI yesterday which showed stroke, was told today by neurologists office to come to ED.  Pt reports no new symptoms, denies HA at time.

## 2017-03-09 NOTE — ED Notes (Signed)
Lunch tray ordered; heart healthy diet 

## 2017-03-09 NOTE — ED Notes (Signed)
Heart Healthy Diet has been Ordered for [employer]

## 2017-03-09 NOTE — ED Notes (Signed)
K. Black, NP,  at bedside.

## 2017-03-09 NOTE — Progress Notes (Signed)
Patient arrived around 2030, right side facial droop, with dysarthria no other neuro deficits assed,  patient has no problems ambulating at this time, educated patient on safety, stroke protocol for assessment Q 2.

## 2017-03-09 NOTE — ED Notes (Signed)
Pt returned from MRI °

## 2017-03-09 NOTE — Progress Notes (Signed)
CSW contacted pt's husband Lanny Hurst) to gather more information on pt and pt's needs. CSW left a voicemail for husband to contact CSW back. CSW will continue to follow with pt's needs.   Virgie Dad Ceairra Mccarver, MSW, [place] [facility] Clinical Social Worker [phone]

## 2017-03-09 NOTE — ED Notes (Signed)
Neuro PA at bedside.  

## 2017-03-10 ENCOUNTER — Encounter (HOSPITAL_COMMUNITY): Payer: Self-pay | Admitting: General Practice

## 2017-03-10 ENCOUNTER — Observation Stay (HOSPITAL_BASED_OUTPATIENT_CLINIC_OR_DEPARTMENT_OTHER): Payer: Medicare HMO

## 2017-03-10 DIAGNOSIS — R29810 Facial weakness: Secondary | ICD-10-CM

## 2017-03-10 DIAGNOSIS — I10 Essential (primary) hypertension: Secondary | ICD-10-CM

## 2017-03-10 DIAGNOSIS — I639 Cerebral infarction, unspecified: Secondary | ICD-10-CM | POA: Diagnosis not present

## 2017-03-10 DIAGNOSIS — R29898 Other symptoms and signs involving the musculoskeletal system: Secondary | ICD-10-CM | POA: Diagnosis not present

## 2017-03-10 LAB — VAS US CAROTID
LEFT ECA DIAS: -27 cm/s
LEFT VERTEBRAL DIAS: -7 cm/s
LICADDIAS: -35 cm/s
LICADSYS: -86 cm/s
LICAPDIAS: -32 cm/s
Left CCA dist dias: 20 cm/s
Left CCA dist sys: 72 cm/s
Left CCA prox dias: 27 cm/s
Left CCA prox sys: 89 cm/s
Left ICA prox sys: -89 cm/s
RCCAPSYS: 78 cm/s
RIGHT ECA DIAS: -24 cm/s
RIGHT VERTEBRAL DIAS: 31 cm/s
Right CCA prox dias: 23 cm/s
Right cca dist sys: -72 cm/s

## 2017-03-10 LAB — HIV ANTIBODY (ROUTINE TESTING W REFLEX): HIV Screen 4th Generation wRfx: NONREACTIVE

## 2017-03-10 LAB — ECHOCARDIOGRAM COMPLETE
HEIGHTINCHES: 63 in
WEIGHTICAEL: 2752 [oz_av]

## 2017-03-10 MED ORDER — CLOPIDOGREL BISULFATE 75 MG PO TABS: 75.0000 mg | ORAL_TABLET | Freq: Every day | ORAL | 0 refills | Status: DC

## 2017-03-10 MED ORDER — CLOPIDOGREL BISULFATE 75 MG PO TABS
75.0000 mg | ORAL_TABLET | Freq: Every day | ORAL | Status: DC
  Administered 2017-03-10: 75 mg via ORAL
  Filled 2017-03-10: qty 1

## 2017-03-10 MED ORDER — ATORVASTATIN CALCIUM 80 MG PO TABS: 80.0000 mg | ORAL_TABLET | Freq: Every day | ORAL | 0 refills | Status: DC

## 2017-03-10 MED ORDER — ROSUVASTATIN CALCIUM 40 MG PO TABS: 40.0000 mg | ORAL_TABLET | Freq: Every day | ORAL | 0 refills | Status: DC

## 2017-03-10 NOTE — Progress Notes (Signed)
*  PRELIMINARY RESULTS* Vascular Ultrasound Carotid Duplex (Doppler) has been completed.  Preliminary findings: Bilateral 1-39% ICA stenosis, antegrade vertebral flow.   [name] 03/10/2017, 3:33 PM

## 2017-03-10 NOTE — Discharge Instructions (Signed)
Robert Bellow,  You were admitted because of a stroke. You will need to follow-up with the cardiologist to obtain a transesophageal echocardiogram and the loop recorder. I have called the cardiologist's office and they will contact you with follow-up instructions. He will be continued on aspirin in addition to starting on Plavix. Please work on quitting smoking as this is very important to reduce the risk of recurrent strokes. He'll be discharged on Crestor.

## 2017-03-10 NOTE — Care Management Obs Status (Signed)
[place] NOTIFICATION   Patient Details  Name: Azalynn Maxim MRN: [id number] Date of Birth: 07-20-1956   Medicare Observation Status Notification Given:  Yes    Pollie Friar, RN 03/10/2017, 4:09 PM

## 2017-03-10 NOTE — Discharge Summary (Signed)
Physician Discharge Summary  [name] PFX:[id number] DOB: 1956/06/26 DOA: 03/09/2017  PCP: [name], MD  Admit date: 03/09/2017 Discharge date: 03/10/2017  Admitted From: Home Disposition: Home  Recommendations for Outpatient Follow-up:  1. Follow up with PCP in 1 week 2. Follow up with Neurology 3. Follow up with cardiology for TEE loop recorder 4. Please follow up on the following pending results: None  Home Health: Patient declined home health services Equipment/Devices: None  Discharge Condition: Stable CODE STATUS: Full code Diet recommendation: Heart healthy   Brief/Interim Summary:  Admission HPI written by Waldemar Dickens, MD and Dyanne Carrel, NP   Chief Complaint: right facial droop slurred speech  HPI: [name] is a 61 y.o. female with medical history significant for hypertension, hyperlipidemia, ITP, Castleman's disease presents to the emergency department from home on the instructions of her neurologist with a chief complaint of stroke.  Information is obtained from the patient and the chart. This indicate that on June 29 she was seen in the emergency department with chief complaint of right-sided facial weakness and slurred speech. At that time she had a negative MRI and ultimately was diagnosed with Bell's palsy. She was discharged home with prednisone and valacyclovir. She states that in spite of taking his medications her symptoms persisted. Since that time she was admitted to the hospital with right upper quadrant pain (6 weeks ago) and during that stay was evaluated by neurology who opined no clear evidence of parotid involvement on her brain via MRI. She states her symptoms have persisted. She saw Dr. Roddie Mc on July 26 who opined an old left frontal lobe infarct without acute intracranial abnormality on CT. She had a repeat MRI yesterday revealing Subacute peripheral stroke left frontal lobe adjacent to a more chronic stroke that was noted on the  01/27/2017 MRI. There are some heme products within the subacute stroke. It also enhances. The stroke could be due to an embolic etiology involving the left middle cerebral. Vasculitis is also in the differential diagnosis. We are asked to admit. She denies headache visual disturbances numbness tingling of her extremities. She does have some neuropathy type symptoms in her right hand/arm and has had them since she had carpal tunnel surgery months ago. She denies chest pain palpitation shortness of breath. She denies abdominal pain nausea vomiting diarrhea constipation melena bright red blood per rectum. She denies dysuria hematuria frequency or urgency. She denies any lower extremity edema or orthopnea.    ED Course: In the emergency department she's afebrile hemodynamically stable and not hypoxic    Hospital course:  Acute CVA MRI obtained and was significant for subacute left frontal lobe stroke pelvis adjacent to her chronic stroke. Neurology was consulted and they have concern for possible embolic etiology. MRA obtained and was significant for moderate stenosis of left cavernous carotid and diffuse atherosclerotic disease in the middle cerebral artery. There is also significance of occluded branch of the operculum in addition to severe disease of frontal branch of the posterior artery. LDL was elevated at 143 with a hemoglobin A1c of 6.1. Patient was started on Crestor. Carotid Dopplers were significant for what 39% stenosis with antegrade vertebral flow. Echocardiogram significant for an EF of 60-65% and grade 1 diastolic dysfunction. Patient was transitioned from aspirin only to aspirin and Plavix prior to discharge. She will need follow-up with neurology in addition to a TEE loop recorded by cardiology. Cardiology called for scheduling and they will call patient to schedule outpatient placement. Physical therapy recommended  speech therapy and occupational therapy the patient declines home health  services.  Bell's palsy Possibly secondary to above problem. Completed steroid course.  Hypothyroidism Continued synthroid  Essential hypertension Continued metoprolol  Right arm weakness Chronic.  Discharge Diagnoses:  Principal Problem:   CVA (cerebral vascular accident) [facility]) Active Problems:   Hypothyroidism   Coronary atherosclerosis   Idiopathic thrombocytopenic purpura (HCC)   Bell's palsy   Facial droop   Hypertension   Right arm weakness    Discharge Instructions   Allergies as of 03/10/2017      Reactions   Cefdinir Other (See Comments)   Causes vision problems   Levofloxacin Itching   Vicodin [hydrocodone-acetaminophen] Itching      Medication List    STOP taking these medications   ibuprofen 200 MG tablet Commonly known as:  ADVIL,MOTRIN     TAKE these medications   ALPRAZolam 0.25 MG tablet Commonly known as:  XANAX Take 0.5 mg by mouth at bedtime as needed for sleep. For sleep   aspirin 81 MG chewable tablet Chew 81 mg by mouth daily.   clopidogrel 75 MG tablet Commonly known as:  PLAVIX Take 1 tablet (75 mg total) by mouth daily.   levothyroxine 112 MCG tablet Commonly known as:  SYNTHROID, LEVOTHROID Take 112 mcg by mouth daily before breakfast.   metoprolol succinate 25 MG 24 hr tablet Commonly known as:  TOPROL-XL Take 12.5 mg by mouth daily.   rosuvastatin 40 MG tablet Commonly known as:  CRESTOR Take 1 tablet (40 mg total) by mouth daily.       Allergies  Allergen Reactions  . Cefdinir Other (See Comments)    Causes vision problems  . Levofloxacin Itching  . Vicodin [Hydrocodone-Acetaminophen] Itching    Consultations:  Neurology   Procedures/Studies: Dg Chest 2 View  Result Date: 03/09/2017 CLINICAL DATA:  Recent stroke history EXAM: CHEST  2 VIEW COMPARISON:  02/03/2017 FINDINGS: Cardiac shadow is within normal limits. The lungs are well aerated bilaterally. No focal infiltrate or sizable effusion is seen. No  acute bony abnormality is noted. IMPRESSION: No active cardiopulmonary disease. Electronically Signed   By: Inez Catalina M.D.   On: 03/09/2017 11:44   Mr Jeri Cos JS Contrast  Result Date: 03/08/2017  [facility] [address] [place], [place] [address] [phone] NEUROIMAGING REPORT STUDY DATE: 03/08/2017 PATIENT NAME: [name] DOB: Dec 25, 1955 MRN: [id number] EXAM: MRI Brain with and without contrast ORDERING CLINICIAN: Asencion Partridge Dohmeier M.D. CLINICAL HISTORY: 61 year old woman with right facial droop, clumsiness and right arm weakness COMPARISON FILMS: 01/27/2017 TECHNIQUE:MRI of the brain with and without contrast was obtained utilizing 5 mm axial slices with T1, T2, T2 flair, SWI and diffusion weighted views.  T1 sagittal, T2 coronal and postcontrast views in the axial and coronal plane were obtained. CONTRAST:  [facility] IMAGING SITE: [facility], [place]. FINDINGS: On sagittal images, the spinal cord is imaged caudally to C2-C3 and is normal in caliber.   The contents of the posterior fossa are of normal size and position.   The pituitary gland has reduced height inside of a normal sized sella turcica this is also seen on previous MRIs.    Brain volume appears normal.   The ventricles are normal in size for ageand without distortion.  There are no abnormal extra-axial collections of fluid.  The cerebellum and brainstem appears normal.   The deep gray matter appears normal.  There is a patchy acute infarction is hyperintense on  diffusion-weighted images in the left frontal lobe adjacent to a wedge-shaped chronic infarction (that was also seen on the 01/27/2017 MRI but not on the 09/15/2010 MRI).   Mild chronic heme products are noted on the susceptibility weighted images. These were not noted on the 01/27/2017 MRI.    There is one small T1 hyperintense focus that could represent a subacute focus (2-7 days).   After the infusion of contrast, there is patchy  enhancement in the region of the acute stroke.    Also in the hemispheres, there are a few scattered non-enhancing T2/FLAIR hyperintense foci consistent with mild chronic microvascular ischemic change, unchanged since the previous MRI.   The orbits appear normal.   The VIIth/VIIIth nerve complex appears normal.  The mastoid air cells appear normal.  The paranasal sinuses appear normal.  Flow voids are identified within the major intracerebral arteries.     This MRI of the brain with and without contrast shows the following: 1.   Subacute peripheral stroke left frontal lobe adjacent to a more chronic stroke that was noted on the 01/27/2017 MRI. There are some heme products within the subacute stroke. It also enhances. The stroke could be due to an embolic etiology involving the left middle cerebral. Vasculitis is also in the differential diagnosis. 2.   Elsewhere there is mild chronic microvascular ischemic change. INTERPRETING PHYSICIAN: Richard A. Felecia Shelling, MD, PhD, FAAN Certified in  Neuroimaging by [employer]   Mr Cervical Spine W Wo Contrast  Result Date: 03/08/2017  [facility] [address] [place], [place] [address] [phone] NEUROIMAGING REPORT STUDY DATE: 03/08/2017 PATIENT NAME: [name] DOB: 09/25/1955 MRN: [id number] EXAM: MRI of the cervical spine without contrast ORDERING CLINICIAN: Asencion Partridge Dohmeier M.D. CLINICAL HISTORY: 61 year old woman with right arm weakness and clumsiness COMPARISON FILMS: None TECHNIQUE: MRI of the cervical spine was obtained utilizing 3 mm sagittal slices from the posterior fossa down to the T3-4 level with T1, T2 and inversion recovery views. In addition 4 mm axial slices from O1-3 down to T1-2 level were included with T2 and gradient echo views.   The study was ordered with and without contrast but no contrasted images are available CONTRAST: 15 mL [facility] IMAGING SITE: [facility], [place],  [place], [place] FINDINGS: :  On sagittal images, the spine is imaged from above the cervicomedullary junction to T2.   The spinal cord is of normal caliber and signal.   There is minimal anterolisthesis of C7 upon T1..    The vertebral bodies have normal signal.  The discs and interspaces were further evaluated on axial views from C2 to T1 as follows: C2 - C3:  There is minimal central disc bulging that does not lead to any nerve root compression.. C3 - C4:  The disc and interspace appear normal. C4 - C5:  The disc and interspace appear normal. C5 - C6:  The disc and interspace appear normal. C6 - C7:  There is central disc protrusion that slightly distorts the thecal sac without causing spinal stenosis or cord compression. Neural foramina are widely patent and there is no nerve root compression.. C7 - T1:   On sagittal images, there appears to be minimal anterolisthesis of C7 upon T1. The disc and interspace appear normal on the axial images. There is no nerve root compression.    This MRI of the cervical spine without contrast shows the following: 1.   The spinal cord appears normal. 2.   There  are minimal to mild degenerative changes noted at C2-C3, C6-C7 and C7-T1 that do not lead to any nerve root compression. INTERPRETING PHYSICIAN: Richard A. Felecia Shelling, MD, PhD, FAAN Certified in  Neuroimaging by [employer]   Mr Virgel [place]aling Wo Contrast  Result Date: 03/09/2017 CLINICAL DATA:  Facial droop, stroke EXAM: MRA HEAD WITHOUT CONTRAST TECHNIQUE: Angiographic images of the Circle of Willis were obtained using MRA technique without intravenous contrast. COMPARISON:  MRI head 03/08/2017, 01/27/2017.  MRA head 10/20/2010 FINDINGS: Right vertebral artery and right PICA patent. Left distal vertebral artery is occluded. Left PICA is supplied from retrograde flow in the distal left vertebral artery. Basilar widely patent. Superior cerebellar and posterior cerebral arteries patent. Distal left vertebral  artery widely patent in 2012. Atherosclerotic irregularity and moderate stenosis left cavernous carotid with progression since 2012. Left M1 segment patent. Multiple areas of irregularity in anterior and posterior M2 divisions on the left. Occluded left M2 segment supplying the operculum and the acute infarct. Progression of disease in the frontal branch of the left anterior division. These vessels appeared normal in 2012. Both anterior cerebral arteries widely patent. Right middle cerebral artery patent. Mild irregularity in right middle cerebral artery branches due to atherosclerotic disease. Negative for aneurysm IMPRESSION: Occlusion of the left vertebral artery. Moderate stenosis left cavernous carotid. Diffuse atherosclerotic disease in the middle cerebral artery M2 branches bilaterally. Occluded branch to the operculum on the left as well as severe disease in a frontal branch of the left middle cerebral artery. These vessels appeared normal on MRA 2012. Electronically Signed   By: Franchot Gallo M.D.   On: 03/09/2017 16:09     Echocardiogram (03/10/2017)  Study Conclusions  - Left ventricle: The cavity size was normal. There was mild   concentric hypertrophy. Systolic function was normal. The   estimated ejection fraction was in the range of 60% to 65%. Wall   motion was normal; there were no regional wall motion   abnormalities. Doppler parameters are consistent with abnormal   left ventricular relaxation (grade 1 diastolic dysfunction).   There was no evidence of elevated ventricular filling pressure by   Doppler parameters. - Aortic valve: Trileaflet; normal thickness leaflets. There was no   regurgitation. - Mitral valve: Structurally normal valve. - Left atrium: The atrium was normal in size. - Right ventricle: The cavity size was normal. Wall thickness was   normal. Systolic function was normal. - Right atrium: The atrium was normal in size. - Pulmonary arteries: Systolic pressure  was within the normal   range. - Inferior vena cava: The vessel was normal in size. - Pericardium, extracardiac: There was no pericardial effusion.   Subjective: No concerns.  Discharge Exam: Vitals:   03/10/17 0630 03/10/17 0830  BP: 130/89 94/79  Pulse: 73 60  Resp: 16 16  Temp: 98.4 F (36.9 C) 98.1 F (36.7 C)  SpO2: 97% 98%   Vitals:   03/10/17 0230 03/10/17 0430 03/10/17 0630 03/10/17 0830  BP: 108/60 120/70 130/89 94/79  Pulse: 70 60 73 60  Resp: 15 14 16 16   Temp: 98.1 F (36.7 C) 98 F (36.7 C) 98.4 F (36.9 C) 98.1 F (36.7 C)  TempSrc: Oral Oral Oral Oral  SpO2: 95% 95% 97% 98%  Weight:      Height:        General: Pt is alert, awake, not in acute distress Cardiovascular: RRR, S1/S2 +, no rubs, no gallops Respiratory: CTA bilaterally, no wheezing, no rhonchi Abdominal:  Soft, NT, ND, bowel sounds + Extremities: no edema, no cyanosis Neuro: Right facial droop with 4-5 weakness on right upper extremity compared to 5 out of 5 on left.    The results of significant diagnostics from this hospitalization (including imaging, microbiology, ancillary and laboratory) are listed below for reference.     Microbiology: No results found for this or any previous visit (from the past 240 hour(s)).   Labs: BNP (last 3 results) No results for input(s): BNP in the last 8760 hours. Basic Metabolic Panel:  Recent Labs Lab 03/09/17 1124 03/09/17 1139  NA 139 140  K 4.4 4.3  CL 105 104  CO2 27  --   GLUCOSE 93 89  BUN 13 15  CREATININE 0.73 0.80  CALCIUM 9.8  --    Liver Function Tests:  Recent Labs Lab 03/09/17 1124  AST 15  ALT 10*  ALKPHOS 95  BILITOT 0.6  PROT 7.2  ALBUMIN 4.4   No results for input(s): LIPASE, AMYLASE in the last 168 hours. No results for input(s): AMMONIA in the last 168 hours. CBC:  Recent Labs Lab 03/09/17 1124 03/09/17 1139  WBC 10.8*  --   NEUTROABS 6.9  --   HGB 14.6 15.3*  HCT 44.6 45.0  MCV 92.1  --   PLT  239  --    Cardiac Enzymes: No results for input(s): CKTOTAL, CKMB, CKMBINDEX, TROPONINI in the last 168 hours. BNP: Invalid input(s): POCBNP CBG: No results for input(s): GLUCAP in the last 168 hours. D-Dimer No results for input(s): DDIMER in the last 72 hours. Hgb A1c  Recent Labs  03/09/17 1124  HGBA1C 6.1*   Lipid Profile  Recent Labs  03/09/17 1124  CHOL 201*  HDL 25*  LDLCALC 143*  TRIG 163*  CHOLHDL 8.0   Urinalysis    Component Value Date/Time   COLORURINE YELLOW 02/02/2017 1516   APPEARANCEUR CLEAR 02/02/2017 1516   LABSPEC 1.015 02/02/2017 1516   PHURINE 5.0 02/02/2017 1516   GLUCOSEU NEGATIVE 02/02/2017 1516   HGBUR NEGATIVE 02/02/2017 1516   [place] 02/02/2017 1516   KETONESUR NEGATIVE 02/02/2017 1516   PROTEINUR NEGATIVE 02/02/2017 1516   UROBILINOGEN 0.2 05/29/2011 1728   NITRITE NEGATIVE 02/02/2017 1516   LEUKOCYTESUR TRACE (A) 02/02/2017 1516     SIGNED:   Cordelia Poche, MD [facility] 03/10/2017, 11:30 AM Pager [phone]  If 7PM-7AM, please contact night-coverage [email or web address] Password [email or web address]

## 2017-03-10 NOTE — Progress Notes (Signed)
STROKE TEAM PROGRESS NOTE   HISTORY OF PRESENT ILLNESS (per record)  [name] is an 61 y.o. female was seen back in 02/03/2017 at that time with a right facial droop and weakness 2 days and MRI which was negative. She was diagnosed with Bell's palsy and sent home with prednisone. Per patient since that period of time up to now her symptoms of right facial droop, right face numbness and difficulty swallowing and speaking have increased in addition she now has some right arm numbness. MRI was obtained as an outpatient which showed a subacute peripheral stroke on the left frontal lobe. For this reason patient was told to come to the emergency room and have a stroke workup. Patient admits to tobacco abuse and states that she does take aspirin however when her stomach is causing her problems she will not take aspirin at that time.  Date last known well: Unable to determine Time last known well: Unable to determine tPA Given: No: No exact last known normal   Modified Rankin: Rankin Score=0   Patient was not administered IV t-PA secondary to out of window.   SUBJECTIVE (INTERVAL HISTORY)  No acute events overnight. Pt says that she took the prednisone and acyclovir for bells palsy. Then she saw dr Dohmeier when she underwent imaging. Says that she has difficulty writing with her right hand and it started in September. She had carpal tunnel. Also endorses bilateral hand pain.  We explained that the mri had two strokes- one is old. It is possible but not certain that the right hand weakness from september is related to her old stroke. The strokes are right next to each other.    OBJECTIVE Temp:  [97.7 F (36.5 C)-98.4 F (36.9 C)] 98.1 F (36.7 C) (08/10 0830) Pulse Rate:  [53-73] 60 (08/10 0830) Cardiac Rhythm: Normal sinus rhythm (08/10 0700) Resp:  [14-20] 16 (08/10 0830) BP: (94-140)/(54-97) 94/79 (08/10 0830) SpO2:  [95 %-99 %] 98 % (08/10 0830)  CBC:  Recent Labs Lab  03/09/17 1124 03/09/17 1139  WBC 10.8*  --   NEUTROABS 6.9  --   HGB 14.6 15.3*  HCT 44.6 45.0  MCV 92.1  --   PLT 239  --     Basic Metabolic Panel:  Recent Labs Lab 03/09/17 1124 03/09/17 1139  NA 139 140  K 4.4 4.3  CL 105 104  CO2 27  --   GLUCOSE 93 89  BUN 13 15  CREATININE 0.73 0.80  CALCIUM 9.8  --     Lipid Panel:    Component Value Date/Time   CHOL 201 (H) 03/09/2017 1124   TRIG 163 (H) 03/09/2017 1124   HDL 25 (L) 03/09/2017 1124   CHOLHDL 8.0 03/09/2017 1124   VLDL 33 03/09/2017 1124   LDLCALC 143 (H) 03/09/2017 1124   HgbA1c:  Lab Results  Component Value Date   HGBA1C 6.1 (H) 03/09/2017   Urine Drug Screen: No results found for: LABOPIA, COCAINSCRNUR, LABBENZ, AMPHETMU, THCU, LABBARB  Alcohol Level No results found for: [facility]  IMAGING  Dg Chest 2 View  Result Date: 03/09/2017 CLINICAL DATA:  Recent stroke history EXAM: CHEST  2 VIEW COMPARISON:  02/03/2017 FINDINGS: Cardiac shadow is within normal limits. The lungs are well aerated bilaterally. No focal infiltrate or sizable effusion is seen. No acute bony abnormality is noted. IMPRESSION: No active cardiopulmonary disease. Electronically Signed   By: Inez Catalina M.D.   On: 03/09/2017 11:44   Mr Jeri Cos ZO Contrast  Result Date: 03/08/2017  [facility] [address], [place], [place] [address] [phone] NEUROIMAGING REPORT STUDY DATE: 03/08/2017 PATIENT NAME: [name] DOB: 14-Jun-1956 MRN: [id number] EXAM: MRI Brain with and without contrast ORDERING CLINICIAN: Asencion Partridge Dohmeier M.D. CLINICAL HISTORY: 61 year old woman with right facial droop, clumsiness and right arm weakness COMPARISON FILMS: 01/27/2017 TECHNIQUE:MRI of the brain with and without contrast was obtained utilizing 5 mm axial slices with T1, T2, T2 flair, SWI and diffusion weighted views.  T1 sagittal, T2 coronal and postcontrast views in the axial and coronal plane were obtained. CONTRAST:  [facility] IMAGING  SITE: [facility], [place]. FINDINGS: On sagittal images, the spinal cord is imaged caudally to C2-C3 and is normal in caliber.   The contents of the posterior fossa are of normal size and position.   The pituitary gland has reduced height inside of a normal sized sella turcica this is also seen on previous MRIs.    Brain volume appears normal.   The ventricles are normal in size for ageand without distortion.  There are no abnormal extra-axial collections of fluid.  The cerebellum and brainstem appears normal.   The deep gray matter appears normal.  There is a patchy acute infarction is hyperintense on diffusion-weighted images in the left frontal lobe adjacent to a wedge-shaped chronic infarction (that was also seen on the 01/27/2017 MRI but not on the 09/15/2010 MRI).   Mild chronic heme products are noted on the susceptibility weighted images. These were not noted on the 01/27/2017 MRI.    There is one small T1 hyperintense focus that could represent a subacute focus (2-7 days).   After the infusion of contrast, there is patchy enhancement in the region of the acute stroke.    Also in the hemispheres, there are a few scattered non-enhancing T2/FLAIR hyperintense foci consistent with mild chronic microvascular ischemic change, unchanged since the previous MRI.   The orbits appear normal.   The VIIth/VIIIth nerve complex appears normal.  The mastoid air cells appear normal.  The paranasal sinuses appear normal.  Flow voids are identified within the major intracerebral arteries.     This MRI of the brain with and without contrast shows the following: 1.   Subacute peripheral stroke left frontal lobe adjacent to a more chronic stroke that was noted on the 01/27/2017 MRI. There are some heme products within the subacute stroke. It also enhances. The stroke could be due to an embolic etiology involving the left middle cerebral. Vasculitis is also in the differential diagnosis. 2.   Elsewhere  there is mild chronic microvascular ischemic change. INTERPRETING PHYSICIAN: Richard A. Felecia Shelling, MD, PhD, FAAN Certified in  Neuroimaging by [employer]   Mr Cervical Spine W Wo Contrast  Result Date: 03/08/2017  [facility] [address] [place], [place] [address] [phone] NEUROIMAGING REPORT STUDY DATE: 03/08/2017 PATIENT NAME: [name] DOB: 03-23-1956 MRN: [id number] EXAM: MRI of the cervical spine without contrast ORDERING CLINICIAN: Asencion Partridge Dohmeier M.D. CLINICAL HISTORY: 61 year old woman with right arm weakness and clumsiness COMPARISON FILMS: None TECHNIQUE: MRI of the cervical spine was obtained utilizing 3 mm sagittal slices from the posterior fossa down to the T3-4 level with T1, T2 and inversion recovery views. In addition 4 mm axial slices from F8-1 down to T1-2 level were included with T2 and gradient echo views.   The study was ordered with and without contrast but no contrasted images are available CONTRAST: 15 mL [facility]  IMAGING SITE: [facility], [place], [place] FINDINGS: :  On sagittal images, the spine is imaged from above the cervicomedullary junction to T2.   The spinal cord is of normal caliber and signal.   There is minimal anterolisthesis of C7 upon T1..    The vertebral bodies have normal signal.  The discs and interspaces were further evaluated on axial views from C2 to T1 as follows: C2 - C3:  There is minimal central disc bulging that does not lead to any nerve root compression.. C3 - C4:  The disc and interspace appear normal. C4 - C5:  The disc and interspace appear normal. C5 - C6:  The disc and interspace appear normal. C6 - C7:  There is central disc protrusion that slightly distorts the thecal sac without causing spinal stenosis or cord compression. Neural foramina are widely patent and there is no nerve root compression.. C7 - T1:   On sagittal images, there appears to be minimal anterolisthesis of  C7 upon T1. The disc and interspace appear normal on the axial images. There is no nerve root compression.    This MRI of the cervical spine without contrast shows the following: 1.   The spinal cord appears normal. 2.   There are minimal to mild degenerative changes noted at C2-C3, C6-C7 and C7-T1 that do not lead to any nerve root compression. INTERPRETING PHYSICIAN: Richard A. Felecia Shelling, MD, PhD, FAAN Certified in  Neuroimaging by [employer]   Mr [name] Wo Contrast  Result Date: 03/09/2017 CLINICAL DATA:  Facial droop, stroke EXAM: MRA HEAD WITHOUT CONTRAST TECHNIQUE: Angiographic images of the Circle of Willis were obtained using MRA technique without intravenous contrast. COMPARISON:  MRI head 03/08/2017, 01/27/2017.  MRA head 10/20/2010 FINDINGS: Right vertebral artery and right PICA patent. Left distal vertebral artery is occluded. Left PICA is supplied from retrograde flow in the distal left vertebral artery. Basilar widely patent. Superior cerebellar and posterior cerebral arteries patent. Distal left vertebral artery widely patent in 2012. Atherosclerotic irregularity and moderate stenosis left cavernous carotid with progression since 2012. Left M1 segment patent. Multiple areas of irregularity in anterior and posterior M2 divisions on the left. Occluded left M2 segment supplying the operculum and the acute infarct. Progression of disease in the frontal branch of the left anterior division. These vessels appeared normal in 2012. Both anterior cerebral arteries widely patent. Right middle cerebral artery patent. Mild irregularity in right middle cerebral artery branches due to atherosclerotic disease. Negative for aneurysm IMPRESSION: Occlusion of the left vertebral artery. Moderate stenosis left cavernous carotid. Diffuse atherosclerotic disease in the middle cerebral artery M2 branches bilaterally. Occluded branch to the operculum on the left as well as severe disease in a frontal  branch of the left middle cerebral artery. These vessels appeared normal on MRA 2012. Electronically Signed   By: Franchot Gallo M.D.   On: 03/09/2017 16:09       PHYSICAL EXAM Pleasant middle aged female not in distress. . Afebrile. Head is nontraumatic. Neck is supple without bruit.    Cardiac exam no murmur or gallop. Lungs are clear to auscultation. Distal pulses are well felt. Neurologic Exam: MS: A&O to person, time, place, and president no aphasia. Mild dysarthria. Follows three-step commands. CN II-XII intact except right lower half of the face has moderate weakness. No weakness of forehead or eye closure muscles Sensory: intact to light touch Motor:  Right approximately weakness graded 4/5 with more weakness distally in the  right grip and hand muscles as well as right wrist muscles. Normal strength in the right lower extremity. Normal strength in the left side. , normal muscle tone Cerebellar: normal FTN Sensation is preserved bilaterally but mild hyperesthesia in the right hand. Gait not tested    ASSESSMENT/PLAN [name] is a 61 y.o. female with history of ITP, castlemans, previously diagnosed bells palsy presenting with right sided weakness   Stroke: subacute left frontal lobe peripheral stroke adjacent to more chronic stroke. - possibly embolic involving left MCA distal territory. Resultant  Right sided weakness/facial droop CT head 6/29-Changes consistent with prior left parietal infarct. No acute abnormality noted. MRI head Subacute peripheral stroke left frontal lobe adjacent to a more chronic stroke that was noted on the 01/27/2017 MRI. There are some heme products within the subacute stroke. It also enhances. The stroke could be due to an embolic etiology involving the left middle cerebral. Vasculitis is also in the differential diagnosis. Elsewhere there is mild chronic microvascular ischemic change.   MRA head Occlusion of the left vertebral artery. Moderate  stenosis left cavernous carotid. Diffuse atherosclerotic disease in the middle cerebral artery M2 branches bilaterally. Occluded branch to the operculum on the left as well as severe disease in a frontal branch of the left middle cerebral artery. These vessels appeared normal on MRA 2012.  Carotid Doppler  pending  2D Echo  pending  LDL 143  HgbA1c 6.1  lovenox for VTE prophylaxis  Diet Heart Room service appropriate? Yes; Fluid consistency: Thin  aspirin 81 mg daily prior to admission, now on Aspirin and plavix  Patient counseled to be compliant with her antithrombotic medications  Ongoing aggressive stroke risk factor management  Therapy recommendations:  pending  Disposition:  pending  Hypertension  Stable  Permissive hypertension (OK if < 220/120) but gradually normalize in 5-7 days  Long-term BP goal normotensive  Hyperlipidemia  LDL 143, goal < 70  Atorvastatin 80 mg   Continue statin at discharge  Diabetes  HgbA1c 6.1, goal < 7.0  Borderline Controlled  Other Stroke Risk Factors  Advanced age she has been smoking Cigarettes.  She has a 12.50 pack-year smoking history  Moderate ETOH use, advised to drink no more than 1-2 drink(s) a day  Obesity, Body mass index is 30.47 kg/m., recommend weight loss, diet and exercise as appropriate   Coronary artery disease  Other Active Problems  ITP  Castlemans disease  ? Bells palsy?- vs stroke symptoms  HTN  [facility] day # 0  Signed Burgess Estelle MD IMTS Stroke Team I have personally examined this patient, reviewed notes, independently viewed imaging studies, participated in medical decision making and plan of care.ROS completed by me personally and pertinent positives fully documented  I have made any additions or clarifications directly to the above note. Agree with note above. She has presented with subacute starting right facial and hand weakness likely due to left MCA embolic branch  infarct 6 at source to be determined. Recommend check carotid ultrasound and if negative consider doing outpatient transesophageal echocardiogram and prolonged cardiac monitoring for atrial fibrillation. Recommend aspirin and Plavix for 3 months followed by aspirin alone. Patient counseled to quit smoking. Add statin for elevated lipids. Follow-up as an outpatient with her primary neurologist Dr. Brett Fairy. Greater than 50% time during this 35 minute visit was spent on counseling and coordination of care about her embolic stroke, discussion about evaluation and treatment plan and answering questions.  Antony Contras, MD  Medical Director [facility] Pager: [phone] 03/10/2017 12:21 PM  To contact Stroke Continuity provider, please refer to [email or web address] After hours, contact General Neurology

## 2017-03-10 NOTE — Evaluation (Signed)
Physical Therapy Evaluation/Discharge Patient Details Name: [name] MRN: [id number] DOB: 1955/12/01 Today's Date: 03/10/2017   History of Present Illness  61 y.o. female was seen back in 02/03/2017 at that time with a right facial droop and weakness 2 days and MRI which was negative. She was diagnosed with Bell's palsy and sent home with prednisone. Per patient since that period of time up to now her symptoms of right facial droop, right face numbness and difficulty swallowing and speaking have increased in addition she now has some right arm numbness. MRI was obtained as an outpatient which showed a subacute peripheral stroke on the left frontal lobe.  Pt with other significant PMH of HTN, MI, CKD, and Castleman's Disease.  Clinical Impression  Pt, from a mobility standpoint is doing well.  She is mod I overall and only had difficulty with multiple steps take for steeping over objects in the hallway.  Otherwise, her main deficits are speech and her right arm weakness.  Pt does not need PT acutely or at discharge.  We will sign off.     Follow Up Recommendations No PT follow up    Equipment Recommendations  None recommended by PT    Recommendations for Other Services   NA    Precautions / Restrictions Precautions Precautions: None      Mobility  Bed Mobility Overal bed mobility: Modified Independent                Transfers Overall transfer level: Modified independent Equipment used: None                Ambulation/Gait Ambulation/Gait assistance: Supervision (for higher level gait only) Ambulation Distance (Feet): 300 Feet Assistive device: None Gait Pattern/deviations: WFL(Within Functional Limits)   Gait velocity interpretation: at or above normal speed for age/gender        Modified Rankin (Stroke Patients Only) Modified Rankin (Stroke Patients Only) Pre-Morbid Rankin Score: No symptoms Modified Rankin: Slight disability     Balance Overall  balance assessment: Needs assistance Sitting-balance support: Feet supported;No upper extremity supported Sitting balance-Leahy Scale: Normal     Standing balance support: No upper extremity supported Standing balance-Leahy Scale: Good               High level balance activites: Backward walking;Direction changes;Turns;Sudden stops;Other (comment) High Level Balance Comments: Picked up object from the floor, the only thing she seemed to struggle with was stepping over a tile on the floor, she would stagger and take multiple corrective steps, everything else was normal.             Pertinent Vitals/Pain Pain Assessment: No/denies pain    Home Living Family/patient expects to be discharged to:: Private residence Living Arrangements: Spouse/significant other Available Help at Discharge: Family;Available PRN/intermittently Type of Home: House Home Access: Stairs to enter   [employer] of Steps: 3 Home Layout: Two level Home Equipment: None      Prior Function Level of Independence: Independent         Comments: pt does not currently work.     Hand Dominance   Dominant Hand: Right    Extremity/Trunk Assessment   Upper Extremity Assessment Upper Extremity Assessment: Defer to OT evaluation RUE Deficits / Details: States she has CTS but that her wekness has gotton worse. Pt with poor in hand manipulation skills but is able to use RUE functionally with minimal difficulty. Pt staes she frequently drops items.  RUE Sensation: decreased light touch RUE Coordination: decreased fine motor  Lower Extremity Assessment Lower Extremity Assessment: Overall WFL for tasks assessed (strength 5/5, coordination WNL, sensation intact Bil)    Cervical / Trunk Assessment Cervical / Trunk Assessment: Normal  Communication   Communication: Expressive difficulties  Cognition Arousal/Alertness: Awake/alert Behavior During Therapy: WFL for tasks  assessed/performed Overall Cognitive Status: No family/caregiver present to determine baseline cognitive functioning (not specifically tested, answers questions appropriately.)                                           Exercises Other Exercises Other Exercises: fine motor/coordination Other Exercises: theraputty   Assessment/Plan    PT Assessment Patent does not need any further PT services         PT Goals (Current goals can be found in the Care Plan section)  Acute Rehab PT Goals Patient Stated Goal: to get her right hand strength back PT Goal Formulation: All assessment and education complete, DC therapy               AM-PAC PT "6 Clicks" Daily Activity  Outcome Measure Difficulty turning over in bed (including adjusting bedclothes, sheets and blankets)?: None Difficulty moving from lying on back to sitting on the side of the bed? : None Difficulty sitting down on and standing up from a chair with arms (e.g., wheelchair, bedside commode, etc,.)?: None Help needed moving to and from a bed to chair (including a wheelchair)?: None Help needed walking in hospital room?: None Help needed climbing 3-5 steps with a railing? : None 6 Click Score: 24    End of Session   Activity Tolerance: Patient tolerated treatment well Patient left: Other (comment) (in bathroom)   PT Visit Diagnosis: Other symptoms and signs involving the nervous system (R29.898)    Time: 5003-7048 PT Time Calculation (min) (ACUTE ONLY): 27 min   Charges:   PT Evaluation $PT Eval Moderate Complexity: 1 Mod PT Treatments $Gait Training: 8-22 mins   PT G Codes:   PT G-Codes **NOT FOR INPATIENT CLASS** Functional Assessment Tool Used: AM-PAC 6 Clicks Basic Mobility Functional Limitation: Mobility: Walking and moving around Mobility: Walking and Moving Around Current Status (G8916): 0 percent impaired, limited or restricted Mobility: Walking and Moving Around Goal Status (X4503): 0  percent impaired, limited or restricted Mobility: Walking and Moving Around Discharge Status (U8828): 0 percent impaired, limited or restricted   [name], [place], DPT [phone]   03/10/2017, 2:41 PM

## 2017-03-10 NOTE — Progress Notes (Signed)
PT Cancellation Note  Patient Details Name: [name] MRN: [id number] DOB: Sep 01, 1955   Cancelled Treatment:    Reason Eval/Treat Not Completed: Patient at procedure or test/unavailable.  Pt is in vascular lab.  PT to check back later as time allows.   Thanks,    Barbarann Ehlers. [place], [place], DPT [phone]   03/10/2017, 9:32 AM

## 2017-03-10 NOTE — Progress Notes (Signed)
  Echocardiogram 2D Echocardiogram has been performed.  [name] 03/10/2017, 3:44 PM

## 2017-03-10 NOTE — Progress Notes (Signed)
Occupational Therapy Evaluation Patient Details Name: [name] MRN: [id number] DOB: May 20, 1956 Today's Date: 03/10/2017    History of Present Illness 61 y.o. female was seen back in 02/03/2017 at that time with a right facial droop and weakness 2 days and MRI which was negative. She was diagnosed with Bell's palsy and sent home with prednisone. Per patient since that period of time up to now her symptoms of right facial droop, right face numbness and difficulty swallowing and speaking have increased in addition she now has some right arm numbness. MRI was obtained as an outpatient which showed a subacute peripheral stroke on the left frontal lobe.   Clinical Impression   PTA, pt independent with ADL and mobility. Pt currently close to baseline level of function regarding ADL tasks. Pt with apparent RUE weakness/sensory deficits and communication deficits. Recommend pt follow up with outpatient OT at the neuro outpt center. Pt verbalized understanding.     Follow Up Recommendations  Outpatient OT;Supervision - Intermittent    Equipment Recommendations  None recommended by OT    Recommendations for Other Services       Precautions / Restrictions Precautions Precautions: None      Mobility Bed Mobility Overal bed mobility: Modified Independent                Transfers Overall transfer level: Modified independent                    Balance                                           ADL either performed or assessed with clinical judgement   ADL Overall ADL's : Needs assistance/impaired                                     Functional mobility during ADLs: Modified independent General ADL Comments: Pt able to complete basic self care close to independent baseline level. Has difficulty with fine motor tasks, i.e. buttoning; writing and cutting/cooking.      Vision Baseline Vision/History: Wears glasses Vision Assessment?: No  apparent visual deficits Additional Comments: will further assess     Perception     Praxis      Pertinent Vitals/Pain Pain Assessment: No/denies pain     Hand Dominance Right   Extremity/Trunk Assessment Upper Extremity Assessment Upper Extremity Assessment: RUE deficits/detail RUE Deficits / Details: States she has CTS but that her wekness has gotton worse. Pt with poor in hand manipulation skills but is able to use RUE functionally with minimal difficulty. Pt staes she frequently drops items.  RUE Sensation: decreased light touch RUE Coordination: decreased fine motor   Lower Extremity Assessment Lower Extremity Assessment: Defer to PT evaluation   Cervical / Trunk Assessment Cervical / Trunk Assessment: Normal   Communication Communication Communication: Expressive difficulties   Cognition Arousal/Alertness: Awake/alert Behavior During Therapy: WFL for tasks assessed/performed Overall Cognitive Status: No family/caregiver present to determine baseline cognitive functioning                                     General Comments       Exercises Exercises: Other exercises Other Exercises Other Exercises: fine motor/coordination Other Exercises: theraputty  Shoulder Instructions      Home Living Family/patient expects to be discharged to:: Private residence Living Arrangements: Spouse/significant other Available Help at Discharge: Family Type of Home: House             Bathroom Shower/Tub: Tub/shower unit                    Prior Functioning/Environment Level of Independence: Independent                 OT Problem List: Decreased strength;Decreased coordination;Impaired sensation;Impaired UE functional use      OT Treatment/Interventions: Self-care/ADL training;Therapeutic exercise;DME and/or AE instruction;Therapeutic activities;Patient/family education    OT Goals(Current goals can be found in the care plan section)  Acute Rehab OT Goals Patient Stated Goal: to have better use of my R hand OT Goal Formulation: All assessment and education complete, DC therapy  OT Frequency:     Barriers to D/C:            Co-evaluation              AM-PAC PT "6 Clicks" Daily Activity     Outcome Measure Help from another person eating meals?: None Help from another person taking care of personal grooming?: None Help from another person toileting, which includes using toliet, bedpan, or urinal?: None Help from another person bathing (including washing, rinsing, drying)?: None Help from another person to put on and taking off regular upper body clothing?: None Help from another person to put on and taking off regular lower body clothing?: None 6 Click Score: 24   End of Session Nurse Communication: Mobility status  Activity Tolerance: Patient tolerated treatment well Patient left: in bed;with call bell/phone within reach  OT Visit Diagnosis: Muscle weakness (generalized) (M62.81)                Time: 1210-1230 OT Time Calculation (min): 20 min Charges:  OT General Charges $OT Visit: 1 Procedure OT Evaluation $OT Eval Low Complexity: 1 Procedure G-Codes: OT G-codes **NOT FOR INPATIENT CLASS** Functional Assessment Tool Used: Clinical judgement Functional Limitation: Self care Self Care Current Status (K3491): At least 1 percent but less than 20 percent impaired, limited or restricted Self Care Goal Status (P9150): At least 1 percent but less than 20 percent impaired, limited or restricted Self Care Discharge Status [id number]): At least 1 percent but less than 20 percent impaired, limited or restricted   [facility], OT/L  [phone] 03/10/2017 [place], OTR/L  [phone] 03/10/2017  Bowyn Mercier,HILLARY 03/10/2017, 1:06 PM

## 2017-03-10 NOTE — Progress Notes (Signed)
Pt discharge education and instructions completed with pt at bedside; pt voices understanding and denies any questions. IV and telemetry removed; pt handed her prescriptions for Plavix and Crestor. Pt discharge home with family to transport her home. Pt offered wheelchair but she declines and ambulated off unit with family and belongings to the side. Delia Heady RN

## 2017-03-10 NOTE — Care Management Note (Signed)
Case Management Note  Patient Details  Name: [name] MRN: [id number] Date of Birth: [birth year]  Subjective/Objective:   Pt admitted with subacute CVA. She is from home with her husband.                  Action/Plan: No f/u per PT and Outpatient per OT. CM following for d/c needs, physician orders.   Expected Discharge Date:                  Expected Discharge Plan:  OP Rehab  In-House Referral:     Discharge planning Services  CM Consult  Post Acute Care Choice:    Choice offered to:     DME Arranged:    DME Agency:     HH Arranged:    HH Agency:     Status of Service:  In process, will continue to follow  If discussed at Long Length of Stay Meetings, dates discussed:    Additional Comments:  Pollie Friar, RN 03/10/2017, 4:34 PM

## 2017-03-10 NOTE — Evaluation (Signed)
Speech Language Pathology Evaluation Patient Details Name: [name] MRN: [id number] DOB: Sep 14, 1955 Today's Date: 03/10/2017 Time: 1223-1250 SLP Time Calculation (min) (ACUTE ONLY): 27 min  Problem List:  Patient Active Problem List   Diagnosis Date Noted  . CVA (cerebral vascular accident) ([place]) 03/09/2017  . Hypertension 03/09/2017  . Right arm weakness 03/09/2017  . Clumsiness on examination 02/23/2017  . Facial droop 02/23/2017  . Carpal tunnel syndrome on right 02/23/2017  . Right upper quadrant abdominal pain 02/03/2017  . Bell's palsy 02/03/2017  . Leukocytosis 02/03/2017  . Pulmonary nodule 10/10/2016  . 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 Idiopathic thrombocytopenic purpura ([place]) 06/17/2011  . Castleman disease ([place]) 06/13/2011  . Hypothyroidism 10/08/2010  . UNSPECIFIED DISORDER OF ADRENAL GLANDS 10/08/2010  . Coronary atherosclerosis 10/08/2010  . CONSTIPATION 10/08/2010  . Enlarged lymph nodes 10/08/2010   Past Medical History:  Past Medical History:  Diagnosis Date  . Castleman's disease ([place])   . Chronic kidney disease    failure  . Heart attack ([place]) 2007  . Heart attack ([place]) 2007  . Hypertension   . Hypothyroidism   . ITP (idiopathic thrombocytopenic purpura) 06/17/2011  . Pulmonary nodule 10/10/2016  . Retroperitoneal lymphadenopathy 2012   Past Surgical History:  Past Surgical History:  Procedure Laterality Date  . APPENDECTOMY     61 years old  . CESAREAN SECTION  1983 1989   HPI:  61 y.o. female with medical history significant for hypertension, hyperlipidemia, ITP, Castleman's disease presents to the emergency department from home on the instructions of her neurologist with a chief complaint of stroke; 03/09/17 MRI head indicated occlusion of the left vertebral artery; Moderate stenosis left cavernous carotid. Diffuse atherosclerotic disease in the middle cerebral artery M2 branches bilaterally. Occluded branch to the operculum on the left  as well as severe disease in a frontal branch of the left middle cerebral artery. These vessels appeared normal on MRA 2012  Assessment / Plan / Recommendation Clinical Impression   Pt administered MOCA-B and received a score of 24/30 with 26/30 being a typical score on this assessment; deficits noted within the areas of memory and executive function; pt exhibits mild-moderate dysarthric speech depending on length of verbalization; right facial/lingual weakness/asymmetry and sensory loss noted on OME; pt stated she had been diagnosed with Bell's palsy on June 29th of this year, so this may be contributing to speech intelligibility as well; no dysphagia per pt, although she is using strategies such as small bites and left preference for mastication independently since dx of Bell's palsy in June.  No family present to determine baseline level of functioning, so memory deficits and executive function deficits may be pre-morbid; Recommend for ST to f/u while pt in acute setting and pt stated she was interested in possible [facility] ST if difficulties persist with speech intelligibility.    SLP Assessment  SLP Recommendation/Assessment: Patient needs continued Speech Language Pathology Services SLP Visit Diagnosis: Dysarthria and anarthria (R47.1);Cognitive communication deficit (R41.841)    Follow Up Recommendations  Home health SLP    Frequency and Duration min 2x/week  1 week      SLP Evaluation Cognition  Overall Cognitive Status: No family/caregiver present to determine baseline cognitive functioning Arousal/Alertness: Awake/alert Orientation Level: Oriented X4 Memory: Impaired Memory Impairment: Retrieval deficit Awareness: Appears intact Problem Solving: Appears intact Executive Function: Reasoning Reasoning: Impaired Reasoning Impairment: Verbal basic;Functional basic Behaviors: Perseveration Safety/Judgment: Appears intact       Comprehension  Auditory Comprehension Overall Auditory  Comprehension: Appears  within functional limits for tasks assessed Yes/No Questions: Within Functional Limits Commands: Within Functional Limits Conversation: Complex Visual Recognition/Discrimination Discrimination: Within Function Limits Reading Comprehension Reading Status: Within funtional limits    Expression Expression Primary Mode of Expression: Verbal Verbal Expression Overall Verbal Expression: Impaired Initiation: No impairment Level of Generative/Spontaneous Verbalization: Conversation Repetition: No impairment Naming: No impairment Pragmatics: Unable to assess Interfering Components: Speech intelligibility Non-Verbal Means of Communication: Not applicable Written Expression Dominant Hand: Right Written Expression: Not tested   Oral / Motor  Oral Motor/Sensory Function Overall Oral Motor/Sensory Function: Mild impairment Facial ROM: Reduced right Facial Symmetry: Abnormal symmetry right Facial Strength: Reduced right Facial Sensation: Reduced right Lingual ROM: Reduced right Lingual Symmetry: Abnormal symmetry right Lingual Strength: Reduced Lingual Sensation: Reduced Motor Speech Overall Motor Speech: Appears within functional limits for tasks assessed Respiration: Within functional limits Phonation: Low vocal intensity Resonance: Within functional limits Articulation: Impaired Level of Impairment: Phrase Intelligibility: Intelligibility reduced Word: 50-74% accurate Phrase: 50-74% accurate Sentence: 25-49% accurate Conversation: 0-24% accurate Motor Planning: Witnin functional limits Motor Speech Errors: Not applicable Interfering Components: Premorbid status Effective Techniques: Slow rate;Over-articulate             Functional Assessment Tool Used: NOMS: clinical judgment Functional Limitations: Spoken language expressive Spoken Language Expression Current Status [id number]): At least 40 percent but less than 60 percent impaired, limited or  restricted Spoken Language Expression Goal Status [phone]): At least 20 percent but less than 40 percent impaired, limited or restricted         Elvina Sidle, M.S., CCC-SLP 03/10/2017, 2:04 PM

## 2017-03-10 NOTE — Care Management Note (Signed)
Case Management Note  Patient Details  Name: [name] MRN: [id number] Date of Birth: [birth year]  Subjective/Objective:                    Action/Plan: Plan is for patient to d/c home with [place]. CM met with the patient and provided her a list of [facility] agencies. She currently is refusing [facility] services. CM will notify MD.  Patient has transportation home.   Expected Discharge Date:                  Expected Discharge Plan:  [place]  In-House Referral:     Discharge planning Services  CM Consult  Post Acute Care Choice:    Choice offered to:     DME Arranged:    DME Agency:     HH Arranged:    [place] Agency:     Status of Service:  Completed, signed off  If discussed at [employer] of Stay Meetings, dates discussed:    Additional Comments:  Pollie Friar, RN 03/10/2017, 5:31 PM

## 2017-03-16 ENCOUNTER — Ambulatory Visit: Payer: Self-pay | Admitting: Neurology

## 2017-03-17 DIAGNOSIS — R05 Cough: Secondary | ICD-10-CM | POA: Diagnosis not present

## 2017-03-17 DIAGNOSIS — I69951 Hemiplegia and hemiparesis following unspecified cerebrovascular disease affecting right dominant side: Secondary | ICD-10-CM | POA: Diagnosis not present

## 2017-03-17 DIAGNOSIS — Z6829 Body mass index (BMI) 29.0-29.9, adult: Secondary | ICD-10-CM | POA: Diagnosis not present

## 2017-03-17 DIAGNOSIS — Z720 Tobacco use: Secondary | ICD-10-CM | POA: Diagnosis not present

## 2017-03-17 DIAGNOSIS — E038 Other specified hypothyroidism: Secondary | ICD-10-CM | POA: Diagnosis not present

## 2017-03-17 DIAGNOSIS — I10 Essential (primary) hypertension: Secondary | ICD-10-CM | POA: Diagnosis not present

## 2017-03-17 DIAGNOSIS — E784 Other hyperlipidemia: Secondary | ICD-10-CM | POA: Diagnosis not present

## 2017-03-17 DIAGNOSIS — G510 Bell's palsy: Secondary | ICD-10-CM | POA: Diagnosis not present

## 2017-03-28 ENCOUNTER — Encounter: Payer: Self-pay | Admitting: Neurology

## 2017-03-28 ENCOUNTER — Ambulatory Visit (INDEPENDENT_AMBULATORY_CARE_PROVIDER_SITE_OTHER): Payer: Medicare HMO | Admitting: Neurology

## 2017-03-28 VITALS — Ht 63.0 in | Wt 174.4 lb

## 2017-03-28 DIAGNOSIS — I63412 Cerebral infarction due to embolism of left middle cerebral artery: Secondary | ICD-10-CM | POA: Diagnosis not present

## 2017-03-28 NOTE — Progress Notes (Signed)
[facility] [address] [place]. [place] [address] [phone]       OFFICE FOLLOW-UP NOTE  [name] Date of Birth:  28-Jun-1956 Medical Record Number:  [id number]   HPI: [name] is a 61 year old Caucasian lady with past medical history significant for hypertension, hyperlipidemia, ITP, Castleman's disease   who is seen today for first office follow-up visit following hospital admission for stroke. History is obtained from the patient, review of electronic medical records and have personally reviewed imaging films.[name] an 62 y.o.femalewas seen back in 02/03/2017 at that time with a right facial droop and weakness 2 days and MRI which was negative. She was diagnosed with Bell's palsy and sent home with prednisone. Per patient since that period of time up to now her symptoms of right facial droop, right face numbness and difficulty swallowing and speaking have increased in addition she now has some right arm numbness. MRI was obtained as an outpatient which showed a subacute peripheral stroke along the periphery of chronic infarct the left frontal lobe. For this reason patient was told to come to the emergency room and have a stroke workup. Patient admits to tobacco abuse and states that she does take aspirin however when her stomach is causing her problems she will not take aspirin at that time. Date last known well: Unable to determine.Time last known well: Unable to determine.tPA Given:No: No exact last known normal. Patient informs me that she's actually had right upper extremity weakness and numbness since November 2017. She was evaluated by Dr. Apolonio Schneiders from orthopedics and Dr. of carpal tunnel. She had 2 separate injections of steroids into her lateral right wrist with suboptimal improvement in her weakness and numbness. She did not have any brain imaging at that time. She was seen in the hospital on June 29 by Dr. Addison Lank and thought to have a  dominant facial weakness related to Bell's palsy and started on prednisone and acyclovir. MRI scan of the brain obtained and was negative for acute infarct. rate. She saw Dr. Brett Fairy in office who felt patient may have had a stroke and hence I obtained an outpatient MRI which confirmed a stroke prompting the above admission. She had transthoracic echo which showed normal ejection fraction without obvious cardiac source of embolism.Hemoglobin A1c was 6.1. LDL cholesterol was elevated at 143 mg percent. Carotid ultrasound showed no significant extracranial stenosis. MRA of the brain showed multiple areas of irregularity in the anterior and posterior division of the M2 segment of the left middle cerebral artery with occluded left M2 segment. There was also moderate atheromatous narrowing of the left cavernous carotid artery which had progressed compared with previous MRA from 2012. The left vertebral artery was also occluded. ROS:   14 system review of systems is positive for  numbness, speech difficulty, facial drooping and all other systems negative  PMH:  Past Medical History:  Diagnosis Date  . Castleman's disease ([place])   . Chronic kidney disease    failure  . Heart attack ([place]) 2007  . Heart attack ([place]) 2007  . Hypertension   . Hypothyroidism   . ITP (idiopathic thrombocytopenic purpura) 06/17/2011  . Pulmonary nodule 10/10/2016  . Retroperitoneal lymphadenopathy 2012  . Stroke [facility])     Social History:  Social History   Social History  . Marital status: Married    Spouse name: N/A  . Number of children: N/A  . Years of education: N/A   Occupational History  . Not on file.  Social History Main Topics  . Smoking status: Current Every Day Smoker    Packs/day: 0.50    Years: 25.00    Types: Cigarettes  . Smokeless tobacco: Never Used     Comment: vapor cig sometimes  . Alcohol use 0.6 oz/week    1 Glasses of wine per week     Comment: "occasssionally"  . Drug use: No  .  Sexual activity: Not Currently   Other Topics Concern  . Not on file   Social History Narrative  . No narrative on file    Medications:   Current Outpatient Prescriptions on File Prior to Visit  Medication Sig Dispense Refill  . ALPRAZolam (XANAX) 0.25 MG tablet Take 0.5 mg by mouth at bedtime as needed for sleep. For sleep    . aspirin 81 MG chewable tablet Chew 81 mg by mouth daily.    . clopidogrel (PLAVIX) 75 MG tablet Take 1 tablet (75 mg total) by mouth daily. 30 tablet 0  . levothyroxine (SYNTHROID, LEVOTHROID) 112 MCG tablet Take 112 mcg by mouth daily before breakfast.     . metoprolol succinate (TOPROL-XL) 25 MG 24 hr tablet Take 12.5 mg by mouth daily.     . rosuvastatin (CRESTOR) 40 MG tablet Take 1 tablet (40 mg total) by mouth daily. 30 tablet 0   Current Facility-Administered Medications on File Prior to Visit  Medication Dose Route Frequency Provider Last Rate Last Dose  . sodium chloride 0.9 % injection 10 mL  10 mL Intracatheter PRN Curt Bears, MD        Allergies:   Allergies  Allergen Reactions  . Cefdinir Other (See Comments)    Causes vision problems  . Levofloxacin Itching  . Vicodin [Hydrocodone-Acetaminophen] Itching    Physical Exam General: well developed, well nourished, seated, in no evident distress Head: head normocephalic and atraumatic.  Neck: supple with no carotid or supraclavicular bruits Cardiovascular: regular rate and rhythm, no murmurs Musculoskeletal: no deformity Skin:  no rash/petichiae Vascular:  Normal pulses all extremities There were no vitals filed for this visit. Neurologic Exam Mental Status: Awake and fully alert. Oriented to place and time. Recent and remote memory intact. Attention span, concentration and fund of knowledge appropriate. Mood and affect appropriate. Mild dysarthria. Cranial Nerves: Fundoscopic exam reveals sharp disc margins. Pupils equal, briskly reactive to light. Extraocular movements full without  nystagmus. Visual fields full to confrontation. Hearing intact. Facial sensation intact. Mild lower facial weakness., tongue, palate moves normally and symmetrically.  Motor: Normal bulk and tone. Normal strength in all tested extremity muscles. Mild right upper extremity weakness 4+ out of 5 distally greater than proximally. Weakness of right grip, intrinsic hand muscles. Orbits left over right approximately. Sensory.: intact to touch ,pinprick .position and vibratory sensation.  Coordination: Rapid alternating movements normal in all extremities. Finger-to-nose and heel-to-shin performed accurately bilaterally. Gait and Station: Arises from chair without difficulty. Stance is normal. Gait demonstrates normal stride length and balance . Able to heel, toe and tandem walk without difficulty.  Reflexes: 1+ and symmetric. Toes downgoing.   NIHSS 2 Modified Rankin  2   ASSESSMENT: 68 year Caucasian lady  with recurrent left MCA branch infarcts likely due to intracranial atherosclerosis. Multiple vascular risk factors of smoking, hyperlipidemia and hypertension. History of Castleman's disease and ITP likely unrelated to her stroke  PLAN: I had a long d/w patient about her recent stroke,intracranialatherosclerosis risk for recurrent stroke/TIAs, personally independently reviewed imaging studies and stroke evaluation results and answered questions.Continue  aspirin 81 mg daily and clopidogrel 75 mg daily  for secondary stroke prevention  for 2 more months and then discontinue aspirin and stay on clopidogrel alone. and maintain strict control of hypertension with blood pressure goal below 130/90, diabetes with hemoglobin A1c goal below 6.5% and lipids with LDL cholesterol goal below 70 mg/dL. I also advised the patient to eat a healthy diet with plenty of whole grains, cereals, fruits and vegetables, exercise regularly and maintain ideal body weight . I have complimented her on smoking cessation and advise  her to continue to do so. I have placed her first outpatient speech, physical and occupational therapy. Return for follow-up in 6 months or call earlier if necessary. Greater than 50% of time during this 25 minute visit was spent on counseling,explanation of diagnosis of intracranial atherosclerosis, stroke, planning of further management, discussion with patient and family and coordination of care Antony Contras, MD  [facility] [address] [place], [place] [address]  Phone [phone] Fax [phone] Note: This document was prepared with digital dictation and possible smart phrase technology. Any transcriptional errors that result from this process are unintentional

## 2017-03-28 NOTE — Patient Instructions (Signed)
I had a long d/w patient about her recent stroke,intracranialatherosclerosis risk for recurrent stroke/TIAs, personally independently reviewed imaging studies and stroke evaluation results and answered questions.Continue aspirin 81 mg daily and clopidogrel 75 mg daily  for secondary stroke prevention  for 2 more months and then discontinue aspirin and stay on clopidogrel alone. and maintain strict control of hypertension with blood pressure goal below 130/90, diabetes with hemoglobin A1c goal below 6.5% and lipids with LDL cholesterol goal below 70 mg/dL. I also advised the patient to eat a healthy diet with plenty of whole grains, cereals, fruits and vegetables, exercise regularly and maintain ideal body weight . I have complimented her on smoking cessation and advise her to continue to do so. I have placed her first outpatient speech, physical and occupational therapy. Return for follow-up in 6 months or call earlier if necessary.

## 2017-03-29 ENCOUNTER — Other Ambulatory Visit: Payer: Self-pay

## 2017-03-29 ENCOUNTER — Telehealth: Payer: Self-pay | Admitting: Neurology

## 2017-03-29 MED ORDER — ROSUVASTATIN CALCIUM 40 MG PO TABS: 40.0000 mg | ORAL_TABLET | Freq: Every day | ORAL | 1 refills | Status: AC

## 2017-03-29 MED ORDER — CLOPIDOGREL BISULFATE 75 MG PO TABS: 75.0000 mg | ORAL_TABLET | Freq: Every day | ORAL | 1 refills | Status: AC

## 2017-03-29 MED ORDER — CLOPIDOGREL BISULFATE 75 MG PO TABS: 75.0000 mg | ORAL_TABLET | Freq: Every day | ORAL | 1 refills | Status: DC

## 2017-03-29 MED ORDER — ROSUVASTATIN CALCIUM 40 MG PO TABS: 40.0000 mg | ORAL_TABLET | Freq: Every day | ORAL | 1 refills | Status: DC

## 2017-03-29 NOTE — Telephone Encounter (Signed)
RN call patients cell phone, and husband answer the phone. Rn stated to husband Lanny Hurst that pt was given plavix and crestor on 03/10/2017 and 03/11/2017 in the hospital. Rn stated the medications are daily,and she should not need a refill two weeks early. Rn stated her medications should be done by 03/10/2017. Rn stated pt should have pills till 04/11/2017. Rn stated a refill can be sent to her pharmacy but it cant be refill till 04/10/2017. Pts husband verbalized understanding.

## 2017-03-29 NOTE — Telephone Encounter (Signed)
Refill sent to patients pharmacy.for 90 days. Pt needs to see her PCP for crestor after refill ends.

## 2017-03-29 NOTE — Telephone Encounter (Signed)
Patient was seen by Dr. Leonie Man yesterday and failed to get Rx's called in. She needs clopidogrel (PLAVIX) 75 MG tablet and rosuvastatin (CRESTOR) 40 MG tablet called to [facility] on [address].

## 2017-04-07 ENCOUNTER — Ambulatory Visit (HOSPITAL_COMMUNITY)
Admission: RE | Admit: 2017-04-07 | Discharge: 2017-04-07 | Disposition: A | Discharge status: Home / Self Care | Payer: Medicare HMO | Source: Ambulatory Visit | Attending: Internal Medicine | Admitting: Internal Medicine

## 2017-04-07 ENCOUNTER — Other Ambulatory Visit (HOSPITAL_BASED_OUTPATIENT_CLINIC_OR_DEPARTMENT_OTHER): Payer: Medicare HMO

## 2017-04-07 DIAGNOSIS — R911 Solitary pulmonary nodule: Secondary | ICD-10-CM | POA: Diagnosis not present

## 2017-04-07 DIAGNOSIS — J439 Emphysema, unspecified: Secondary | ICD-10-CM | POA: Insufficient documentation

## 2017-04-07 DIAGNOSIS — K8020 Calculus of gallbladder without cholecystitis without obstruction: Secondary | ICD-10-CM | POA: Diagnosis not present

## 2017-04-07 DIAGNOSIS — R599 Enlarged lymph nodes, unspecified: Secondary | ICD-10-CM | POA: Insufficient documentation

## 2017-04-07 DIAGNOSIS — D47Z2 Castleman disease: Secondary | ICD-10-CM | POA: Diagnosis not present

## 2017-04-07 DIAGNOSIS — I700 Atherosclerosis of aorta: Secondary | ICD-10-CM | POA: Diagnosis not present

## 2017-04-07 LAB — COMPREHENSIVE METABOLIC PANEL
ALBUMIN: 4.4 g/dL (ref 3.5–5.0)
ALK PHOS: 116 U/L (ref 40–150)
ALT: 11 U/L (ref 0–55)
AST: 18 U/L (ref 5–34)
Anion Gap: 9 mEq/L (ref 3–11)
BILIRUBIN TOTAL: 0.38 mg/dL (ref 0.20–1.20)
BUN: 10.4 mg/dL (ref 7.0–26.0)
CALCIUM: 9.9 mg/dL (ref 8.4–10.4)
CO2: 28 mEq/L (ref 22–29)
CREATININE: 0.8 mg/dL (ref 0.6–1.1)
Chloride: 105 mEq/L (ref 98–109)
EGFR: 76 mL/min/{1.73_m2} — ABNORMAL LOW (ref >90)
GLUCOSE: 97 mg/dL (ref 70–140)
POTASSIUM: 4.5 meq/L (ref 3.5–5.1)
SODIUM: 142 meq/L (ref 136–145)
TOTAL PROTEIN: 7.5 g/dL (ref 6.4–8.3)

## 2017-04-07 LAB — CBC WITH DIFFERENTIAL/PLATELET
BASO%: 0.5 % (ref 0.0–2.0)
Basophils Absolute: 0.1 10*3/uL (ref 0.0–0.1)
EOS%: 3.8 % (ref 0.0–7.0)
Eosinophils Absolute: 0.4 10*3/uL (ref 0.0–0.5)
HEMATOCRIT: 43.7 % (ref 34.8–46.6)
HEMOGLOBIN: 14.5 g/dL (ref 11.6–15.9)
LYMPH#: 2.3 10*3/uL (ref 0.9–3.3)
LYMPH%: 23.9 % (ref 14.0–49.7)
MCH: 30.6 pg (ref 25.1–34.0)
MCHC: 33.2 g/dL (ref 31.5–36.0)
MCV: 92.2 fL (ref 79.5–101.0)
MONO#: 0.8 10*3/uL (ref 0.1–0.9)
MONO%: 7.8 % (ref 0.0–14.0)
NEUT%: 64 % (ref 38.4–76.8)
NEUTROS ABS: 6.3 10*3/uL (ref 1.5–6.5)
Platelets: 327 10*3/uL (ref 145–400)
RBC: 4.74 10*6/uL (ref 3.70–5.45)
RDW: 13.1 % (ref 11.2–14.5)
WBC: 9.8 10*3/uL (ref 3.9–10.3)

## 2017-04-07 LAB — LACTATE DEHYDROGENASE: LDH: 157 U/L (ref 125–245)

## 2017-04-07 MED ORDER — IOPAMIDOL (ISOVUE-300) INJECTION 61%
INTRAVENOUS | Status: AC
  Filled 2017-04-07: qty 100

## 2017-04-07 MED ORDER — IOPAMIDOL (ISOVUE-300) INJECTION 61%
100.0000 mL | Freq: Once | INTRAVENOUS | Status: AC | PRN
  Administered 2017-04-07: 100 mL via INTRAVENOUS

## 2017-04-10 ENCOUNTER — Ambulatory Visit: Payer: Medicare HMO | Attending: Neurology | Admitting: Occupational Therapy

## 2017-04-10 ENCOUNTER — Encounter: Payer: Self-pay | Admitting: Occupational Therapy

## 2017-04-10 ENCOUNTER — Ambulatory Visit: Payer: Medicare HMO | Admitting: Physical Therapy

## 2017-04-10 ENCOUNTER — Ambulatory Visit: Payer: Medicare HMO | Admitting: Speech Pathology

## 2017-04-10 VITALS — BP 127/98 | HR 76

## 2017-04-10 DIAGNOSIS — R471 Dysarthria and anarthria: Secondary | ICD-10-CM | POA: Diagnosis present

## 2017-04-10 DIAGNOSIS — R4701 Aphasia: Secondary | ICD-10-CM | POA: Insufficient documentation

## 2017-04-10 DIAGNOSIS — I69351 Hemiplegia and hemiparesis following cerebral infarction affecting right dominant side: Secondary | ICD-10-CM

## 2017-04-10 DIAGNOSIS — R278 Other lack of coordination: Secondary | ICD-10-CM

## 2017-04-10 DIAGNOSIS — R208 Other disturbances of skin sensation: Secondary | ICD-10-CM | POA: Diagnosis present

## 2017-04-10 DIAGNOSIS — R29818 Other symptoms and signs involving the nervous system: Secondary | ICD-10-CM | POA: Diagnosis present

## 2017-04-10 DIAGNOSIS — M6281 Muscle weakness (generalized): Secondary | ICD-10-CM

## 2017-04-10 NOTE — Therapy (Signed)
[facility] [address] [place], [place], [address] Phone: [phone]   Fax:  [phone]  Occupational Therapy Treatment  Patient Details  Name: [name] MRN: [id number] Date of Birth: 10-17-1955 Referring Provider: Dr. Leonie [name]  Encounter Date: 04/10/2017      OT End of Session - 04/10/17 1335    Visit Number 1   Number of Visits 16  pt may not need all 16 visits   Date for OT Re-Evaluation 06/08/17   Authorization Type [employer] MCR - no visit limt or auth required.   Will need G code and PN every 10th visit   Authorization Time Period 60 days   Authorization - Visit Number 1   Authorization - Number of Visits 10   OT Start Time 0933   OT Stop Time 1010   OT Time Calculation ([name]) 37 [name]   Activity Tolerance Patient tolerated treatment well      Past Medical History:  Diagnosis Date  . 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 Chronic kidney disease    failure  . Heart attack ([place]) 2007  . Heart attack ([place]) 2007  . Hypertension   . Hypothyroidism   . ITP (idiopathic thrombocytopenic purpura) 06/17/2011  . Pulmonary nodule 10/10/2016  . Retroperitoneal lymphadenopathy 2012  . Stroke [facility])     Past Surgical History:  Procedure Laterality Date  . APPENDECTOMY     61 years old  . [place]    There were no vitals filed for this visit.      Subjective Assessment - 04/10/17 0935    Subjective  I can't really use my right hand very well.    Pertinent History see epic. Pt s/p subacute L frontal stroke, Pt was diagnosed with Bell's palsy on 02/03/2017 however this progressed. Pt also with H/o of carpal tunnel release in R hand.  MRI also showed old CVA   Patient Stated Goals Pt very tangential - required redirection to answer goal for therapy.  I want to be able to write and have my right hand work better.    Currently in Pain? No/denies            [facility] OT Assessment - 04/10/17 0938      Assessment   Diagnosis L frontal subacute CVA   Referring Provider Dr. Leonie [name]   Onset Date 03/09/17  MRI completed then   Prior Therapy acute care OT and PT     Precautions   Precautions Other (comment)   Precaution Comments assess vitals     Restrictions   Weight Bearing Restrictions No     Balance Screen   Has the patient fallen in the past 6 months No  had PT eval today     Home  Environment   Family/patient expects to be discharged to: Private residence   Living Arrangements Spouse/significant other   Type of [place] One level   Bathroom Ambulance person   Additional Comments No equipment in bathroom     Prior [place] Retired   Leisure I like to go walking, golf      ADL   Eating/Feeding Modified independent  increased time and more difficult   Grooming Modified independent  harder and takes more time   Upper Body Bathing Modified independent  uses L hand more than she used to    Lower Body Bathing Modified independent   Upper  Body Dressing Increased time   Lower Body Dressing Modified independent;Increased time  buttons, zippers and tying hard   Toilet Transfer Independent   Toileting - Clothing Manipulation Modified independent;Increased time   [place] Transfer Independent     IADL   Shopping Takes care of all shopping needs independently   Light Housekeeping Maintains house alone or with occasional assistance  but uses L hand far more   Meal Prep Plans, prepares and serves adequate meals independently   Programmer, applications own vehicle   Medication Management Is responsible for taking medication in correct dosages at correct time   Physiological scientist financial matters independently (budgets, writes checks, pays rent, bills goes to bank), collects and keeps track of income     Mobility   Mobility Status Independent     Written  Expression   Dominant Hand Right   Handwriting 100% legible  for name;      Vision - History   Baseline Vision Wears glasses all the time   Additional Comments Pt denies any visual changes     Activity Tolerance   Activity Tolerance Tolerate 30+ [name] activity without fatigue     Cognition   Overall Cognitive Status Within Functional Limits for tasks assessed   Mini Mental State Exam  Pt denies cognitive changes feels it is only related to ST. Pt able to attend to session in busy clinic and recall details. Pt was somewhat tangential in answering some questions. Will continue to monitor functionally and pt currently working with ST     Sensation   Light Touch Impaired by gross assessment  RUE dulled   Hot/Cold Appears Intact   Proprioception Appears Intact     Coordination   Gross Motor Movements are Fluid and Coordinated Yes   Fine Motor Movements are Fluid and Coordinated No   9 Hole Peg Test Right;Left   Right 9 Hole Peg Test 50.75   Left 9 Hole Peg Test 21,0   Box and Blocks R= 40     Edema   Edema very mild in middle and ring finger     ROM / Strength   AROM / PROM / Strength AROM;Strength     AROM   Overall AROM  Within functional limits for tasks performed     Strength   Overall Strength Deficits   Overall Strength Comments 4+/5 for RUE proximally for shoulder flexion and decreased grip strength.      Hand Function   Right Hand Gross Grasp Impaired   Right Hand Grip (lbs) 25   Left Hand Gross Grasp Functional   Left Hand Grip (lbs) 52                            OT Short Term Goals - 04/10/17 1225      OT SHORT TERM GOAL #1   Title Pt will be mod I for HEP for coordination and grip/RUE strength for RUE - 05/08/2017   Status New     OT SHORT TERM GOAL #2   Title Pt will demonstrate improved grip strength by at least 5 pounds to assist in opening jars (baseline = 25)   Status New     OT SHORT TERM GOAL #3   Title Pt will demonstrate  improve coordination as evidenced by decreasing time on 9 hole peg by at least 5 seconds to improve ease in buttoning, zippng and  tying   Status New           OT Long Term Goals - 04/10/17 1227      OT LONG TERM GOAL #1   Title Pt will  be mod I with upgrade HEP prn - 06/05/2017   Status New     OT LONG TERM GOAL #2   Title Pt will demonstrate improve grip strength by at least 8 pounds to assist with functional home mgmt tasks.  (baseline = 25)     OT LONG TERM GOAL #3   Title Pt will demonstrate ability to pick up 5 pound object from overhead shelf x3 without dropping   Status New     OT LONG TERM GOAL #4   Title Pt will demonstrate improved coordination as evidenced by decreasing time on 9 hole peg by at least 8 seconds to assist in tying, and homemaking tasks.    Status New               Plan - 04/10/17 1326    Clinical Impression Statement Pt is a 61 year old female s/p L frontal subacute CVA on 03/09/2017.  Pt was hospitalized until 03/10/2017.  Pt developed R facial droop in and was diagnosed with Bell's palsy on 02/03/2017 however weakness progressed and pt then began experiencing slurred speech and RUE weakness/numbness (pt also had carpal  tunnel release several months ago).  MRI revealed subacute stroke as well as second old stroke right next to subacute stroke.  Pt presents with the following impairments that impact her ability to use her RUE as her dominant hand in ADL, IADL and leisure activities:  R dominant hemiplegia, decreased proximal strength RUE, decreased grip strength, decreased coordination, impaired sensation RUE, decreased functional use of RUE.  Pt will benefit from skilled OT to address these deficits.   Occupational Profile and client history currently impacting functional performance PMH: HTN, HLD, DM, Castleman's disease, hypothryroidism, CAD s/p MI in 2007, s/p carpal tunnel release R hand several months ago, old L CVA   Occupational performance deficits  (Please refer to evaluation for details): ADL's;IADL's;Leisure   Rehab Potential Good   OT Frequency 2x / week   OT Duration 8 weeks   OT Treatment/Interventions Self-care/ADL training;Electrical Stimulation;Therapeutic exercise;Neuromuscular education;DME and/or AE instruction;Therapeutic activities;Patient/family education   Plan initiate HEP for coodination and grip strength, review goals and POC   Consulted and Agree with Plan of Care Patient      Patient will benefit from skilled therapeutic intervention in order to improve the following deficits and impairments:  Decreased coordination, Decreased knowledge of use of DME, Decreased strength, Impaired UE functional use, Impaired sensation  Visit Diagnosis: Hemiplegia and hemiparesis following cerebral infarction affecting right dominant side ([place]) - Plan: Ot plan of care cert/re-cert  Muscle weakness (generalized) - Plan: Ot plan of care cert/re-cert  Other lack of coordination - Plan: Ot plan of care cert/re-cert  Other symptoms and signs involving the nervous system - Plan: Ot plan of care cert/re-cert      G-Codes - 21/19/41 1338    Functional Assessment Tool Used (Outpatient only) 9 hole peg, dynanometer   Functional Limitation Carrying, moving and handling objects   Carrying, Moving and Handling Objects Current Status (D4081) At least 60 percent but less than 80 percent impaired, limited or restricted   Carrying, Moving and Handling Objects Goal Status (K4818) At least 20 percent but less than 40 percent impaired, limited or restricted      Problem  List Patient Active Problem List   Diagnosis Date Noted  . CVA (cerebral vascular accident) ([place]) 03/09/2017  . Hypertension 03/09/2017  . Right arm weakness 03/09/2017  . Clumsiness on examination 02/23/2017  . Facial droop 02/23/2017  . Carpal tunnel syndrome on right 02/23/2017  . Right upper quadrant abdominal pain 02/03/2017  . Bell's palsy 02/03/2017  . Leukocytosis  02/03/2017  . Pulmonary nodule 10/10/2016  . 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 Idiopathic thrombocytopenic purpura ([place]) 06/17/2011  . Castleman disease ([place]) 06/13/2011  . Hypothyroidism 10/08/2010  . UNSPECIFIED DISORDER OF ADRENAL GLANDS 10/08/2010  . Coronary atherosclerosis 10/08/2010  . CONSTIPATION 10/08/2010  . Enlarged lymph nodes 10/08/2010    Quay Burow, OTR/L 04/10/2017, 1:40 PM  [facility] [address] [place], [place], [address] Phone: [phone]   Fax:  [phone]  Name: [name] MRN: [id number] Date of Birth: 1955/08/07

## 2017-04-10 NOTE — Patient Instructions (Signed)
LIP and TONGUE exercises  --- DO ALL EXERCISES TWICE EACH DAY  1. Lip press - Press your lips together firmly (like making a /m/ sound) and hold 5 seconds -repeat 10 times  2. LOUD and LONG Kiss - make a kissing sound as loud as you can, as long as you can - repeat 10 times  3. Tongue sweep - Sweep your tongue in the pockets of your mouth - touch every tooth  - five revolutions each way  4. PUH! - 10 times       MAKE THE SOUNDS STRONG      TUH - 10 times      KUH! - 10 times  5. Move a licorice stick from side to side in your mouth - 10 back and forth movements  6. Say "OOOOO", and "EEEEE" 10 times (Kiss and smile)  7. Put air in your cheeks, and push on either side 10 times  *KEEP THE AIR IN and DON'T LET IT ESCAPE!!* 

## 2017-04-10 NOTE — Therapy (Signed)
[facility] [address] [place], [place], [address] Phone: [phone]   Fax:  [phone]  Speech Language Pathology Evaluation  Patient Details  Name: [name] MRN: [id number] Date of Birth: April 04, 1956 Referring Provider: Garvin Fila, MD  Encounter Date: 04/10/2017      End of Session - 04/10/17 1300    Visit Number 1   Number of Visits 17   Date for SLP Re-Evaluation 06/09/17   SLP Start Time 0801   SLP Stop Time  0847   SLP Time Calculation (min) 46 min   Activity Tolerance Patient tolerated treatment well      Past Medical History:  Diagnosis Date  . 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 Chronic kidney disease    failure  . Heart attack ([place]) 2007  . Heart attack ([place]) 2007  . Hypertension   . Hypothyroidism   . ITP (idiopathic thrombocytopenic purpura) 06/17/2011  . Pulmonary nodule 10/10/2016  . Retroperitoneal lymphadenopathy 2012  . Stroke [facility])     Past Surgical History:  Procedure Laterality Date  . APPENDECTOMY     61 years old  . [place]    There were no vitals filed for this visit.      Subjective Assessment - 04/10/17 0807    Subjective "I had a strike (stroke), to tell the truth"            SLP Evaluation [facility] - 04/10/17 0801      SLP Visit Information   SLP Received On 04/10/17   Referring Provider Garvin Fila, MD   Onset Date 02/03/2017    Medical Diagnosis Cerebrovascular accident (CVA) due to embolism of left middle cerebral artery      Pain Assessment   Currently in Pain? No/denies     General Information   HPI [name] is a 61 year old Caucasian lady with past medical history significant for hypertension, hyperlipidemia, ITP, Castleman's diseaseseen today for cognitive linguistic evaluation after hospitalization for stroke. She was diagnosed with Bell's palsy in 02/06/17 when initial MRI was negative for stroke. She complained of  increasing right facial droop, right face numbness and difficulty swallowing and speaking, right arm numbness. MRI was obtained as an outpatient which showed a subacute peripheral stroke along the periphery of chronic infarctthe left frontal lobe.   Behavioral/Cognition alert, cooperative   Mobility Status ambulated to session     Prior Functional Status   Cognitive/Linguistic Baseline Within functional limits   Type of Home House   Education some college     Cognition   Overall Cognitive Status Within Functional Limits for tasks assessed   Attention Focused;Sustained   Focused Attention Appears intact   Sustained Attention Appears intact     Auditory Comprehension   Overall Auditory Comprehension Appears within functional limits for tasks assessed     Visual Recognition/Discrimination   Discrimination Not tested     Reading Comprehension   Reading Status Within funtional limits  simple paragraph level WFL     Expression   Primary Mode of Expression Verbal     Verbal Expression   Overall Verbal Expression Impaired   Initiation No impairment   Level of Generative/Spontaneous Verbalization Conversation   Repetition No impairment   Naming Impairment   Responsive 76-100% accurate  5/5   Confrontation 50-74% accurate   Convergent Not tested   Divergent --  animals: 14 in 60 seconds, M words: 4 in 60 seconds   Verbal Errors Semantic  paraphasias   Pragmatics No impairment   Effective Techniques Phonemic cues;Sentence completion;Open ended questions;Semantic cues   Non-Verbal Means of Communication Not applicable     Written Expression   Dominant Hand Right   Written Expression Exceptions to [facility]   Dictation Ability Letter;Word;Phrase;Sentence  paragraph not tested   Self Formulation Ability Paragraph   Interfering Components Legibility;Other (comment)  motor difficulty with use of dominant hand   Effective Techniques Effective monitoring and  self-correction;Verbal/Language cues   Overall Writen Expression mild impairment, self-corrects syntax, spelling errors with cue to review for errors     Oral Motor/Sensory Function   Overall Oral Motor/Sensory Function Impaired   Labial ROM Reduced right   Labial Symmetry Abnormal symmetry right   Labial Strength Reduced Right   Labial Sensation Within Functional Limits   Lingual ROM Within Functional Limits   Lingual Symmetry Within Functional Limits   Lingual Strength Within Functional Limits   Lingual Sensation Within Functional Limits   Lingual Coordination Reduced   Facial ROM Reduced right   Facial Symmetry Right droop   Facial Sensation Within Functional Limits   Velum Within Functional Limits   Mandible Within Functional Limits     Motor Speech   Overall Motor Speech Impaired   Respiration Within functional limits   Phonation Hoarse  rough vocal quality; this appears to be pt's baseline   Resonance Hypernasality   Articulation Impaired   Level of Impairment Phrase   Intelligibility Intelligibility reduced   Word 75-100% accurate   Phrase 75-100% accurate   Sentence 75-100% accurate   Conversation 75-100% accurate   Motor Planning Impaired   Level of Impairment Phrase   Motor Speech Errors Groping for words;Aware;Inconsistent   Effective Techniques Slow rate;Over-articulate   Phonation WFL   Volume Appropriate   Pitch Appropriate     Standardized Assessments   Standardized Assessments  Other Assessment   Other Assessment portions of BDAE; see clinical impressions for details                         SLP Education - 04/10/17 1259    Education provided Yes   Education Details clinical findings, course of treatment, speech tasks vs oral motor exercises supported for improvement of speech   Person(s) Educated Patient   Methods Explanation   Comprehension Verbalized understanding          SLP Short Term Goals - 04/10/17 1702      SLP  SHORT TERM GOAL #1   Title pt will perform dysarthria HEP with rare min A over three sessions   Time 4   Period Weeks   Status New     SLP SHORT TERM GOAL #2   Title pt will demo compensations for apraxia/dysarthria/aphasia in 5 minutes simple conversation to achieve 100% intelligibility   Time 4   Period Weeks   Status New          SLP Long Term Goals - 04/10/17 1705      SLP LONG TERM GOAL #1   Title pt will perform dysarthria HEP with modified independence over three sessions   Time 8   Period Weeks   Status New     SLP LONG TERM GOAL #2   Title pt will participate in 15 minutes mod complex conversation using compensations for dysarthria/apraxia/aphasia, in order to achieve 100% intelligibility over 3 sessions   Time 8   Period Weeks   Status New  Plan - 04/12/2017 1301    Clinical Impression Statement Patient presents with mild dysarthria and 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 In conversation today pt's speech was functional; she did have mild dysnomia (squirrel/racoon vs. beaver), anomia (cactus), during confrontation naming tasks. Pt had difficulty naming m words (Testing of motor planning revealed greater errors with increasing length, complexity of utterances. In conversation, pt is 95% intelligible, but with increasing rate of speech she is noted with more frequent errors hindering her intelligibility (85% with increased rate of speech). She reports reduced quality of life as a result of the above deficits and would benefit from skilled ST to address dysarthria, apraxia, and aphasia. Pt denies swallowing difficulties however further assessment may be warranted given pt's complaint, "I have to chew on my left side." Will address in future therapy session.   Speech Therapy Frequency 2x / week   Duration --  8 weeks or 16 visits   Treatment/Interventions Language facilitation;Cueing hierarchy;SLP instruction and feedback;Functional  tasks;Compensatory strategies;Multimodal communcation approach;Patient/family education   Potential to Achieve Goals Good   SLP Home Exercise Plan provided HEP for dysarthria   Consulted and Agree with Plan of Care Patient      Patient will benefit from skilled therapeutic intervention in order to improve the following deficits and impairments:   Dysarthria and anarthria  Aphasia      G-Codes - 04/12/2017 1707    Functional Assessment Tool Used skilled clinical judgment, NOMS   Functional Limitations Motor speech   Motor Speech Current Status [phone]) At least 20 percent but less than 40 percent impaired, limited or restricted   Motor Speech Goal Status (R6789) At least 1 percent but less than 20 percent impaired, limited or restricted      Problem List Patient Active Problem List   Diagnosis Date Noted  . CVA (cerebral vascular accident) ([place]) 03/09/2017  . Hypertension 03/09/2017  . Right arm weakness 03/09/2017  . Clumsiness on examination 02/23/2017  . Facial droop 02/23/2017  . Carpal tunnel syndrome on right 02/23/2017  . Right upper quadrant abdominal pain 02/03/2017  . Bell's palsy 02/03/2017  . Leukocytosis 02/03/2017  . Pulmonary nodule 10/10/2016  . 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 Idiopathic thrombocytopenic purpura ([place]) 06/17/2011  . Castleman disease ([place]) 06/13/2011  . Hypothyroidism 10/08/2010  . UNSPECIFIED DISORDER OF ADRENAL GLANDS 10/08/2010  . Coronary atherosclerosis 10/08/2010  . CONSTIPATION 10/08/2010  . Enlarged lymph nodes 10/08/2010   Deneise Lever, [place], CCC-SLP Speech-Language Pathologist  Aliene Altes 04-12-2017, 5:12 PM  [facility] [address] [place], [place], [address] Phone: [phone]   Fax:  [phone]  Name: [name] MRN: [id number] Date of Birth: 02-Jan-1956

## 2017-04-10 NOTE — Therapy (Signed)
[facility] [address] [place], [place], [address] Phone: [phone]   Fax:  [phone]  Physical Therapy Evaluation  Patient Details  Name: [name] MRN: [id number] Date of Birth: Oct 30, 1955 Referring Provider: Garvin Fila, MD  Encounter Date: 04/10/2017      PT End of Session - 04/10/17 1033    Visit Number 1   Number of Visits 1  one time eval only   Date for PT Re-Evaluation --  one time eval only   Authorization Type [employer] MEDICARE   PT Start Time [id number]   PT Stop Time 0930   PT Time Calculation (min) 40 min   Activity Tolerance Patient tolerated treatment well   Behavior During Therapy [facility] for tasks assessed/performed      Past Medical History:  Diagnosis Date  . 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 Chronic kidney disease    failure  . Heart attack ([place]) 2007  . Heart attack ([place]) 2007  . Hypertension   . Hypothyroidism   . ITP (idiopathic thrombocytopenic purpura) 06/17/2011  . Pulmonary nodule 10/10/2016  . Retroperitoneal lymphadenopathy 2012  . Stroke [facility])     Past Surgical History:  Procedure Laterality Date  . APPENDECTOMY     61 years old  . CESAREAN SECTION  1983 1989    Vitals:   04/10/17 0902 04/10/17 0922  BP: (!) 151/98 (!) 127/98  Pulse: 68 76         Subjective Assessment - 04/10/17 0851    Subjective Pt presents to OPPT evaluation s/p L frontal CVA with R sided weakness; pt reports this began a year ago with carpal tunnel diagnosis bilaterally treated with with monthly injections.  Pt reports c/o RUE weakness while participating in injections-"felt dead".  Underwent surgery for R carpal tunnel with no resolution of RUE weakness.  Returned to MD and ED and then was diagnosed with Bell's Palsy due to facial weakness.  Due to continued weakness pt went for further imaging and found to have chronic and sub-acute L frontal CVA.  Currently pt presents with difficulty with  speaking, swallowing, LUE weakness.  Pt denies having any RLE weakness, balance or gait issues.   Diagnostic tests MRI   Patient Stated Goals Get exercises for mouth and improve use of RUE   Currently in Pain? No/denies            [facility] PT Assessment - 04/10/17 8466      Assessment   Medical Diagnosis L CVA   Referring Provider Garvin Fila, MD   Onset Date/Surgical Date 02/20/17   Hand Dominance Right   Prior Therapy none     Precautions   Precautions Other (comment)   Precaution Comments assess vitals     Balance Screen   Has the patient fallen in the past 6 months No   Has the patient had a decrease in activity level because of a fear of falling?  No   Is the patient reluctant to leave their home because of a fear of falling?  No     Home Environment   Living Environment Private residence   Living Arrangements Spouse/significant other   Type of [place] to enter   Entrance Stairs-Number of Steps 2   Entrance Stairs-Rails None   Home Layout One level     Prior Function   Level of Independence Independent     Observation/Other Assessments   Focus on Therapeutic Outcomes (  FOTO)  77 (23% limited; predicted 18% limitation)   Other Surveys  Other Surveys   Neuro Quality of Life  LE: 49.9     Observation/Other Assessments-Edema    Edema --  RUE edema     Sensation   Light Touch Impaired Detail   Light Touch Impaired Details Impaired RUE  absent R thumb   Proprioception Appears Intact     Coordination   Gross Motor Movements are Fluid and Coordinated No     ROM / Strength   AROM / PROM / Strength Strength     Strength   Overall Strength Comments LLE: 4+/5 except hip flexion 4/5; RLE: same as LLE     Ambulation/Gait   Ambulation/Gait Yes   Ambulation/Gait Assistance 7: Independent   Ambulation Distance (Feet) 115 Feet   Assistive device None   Gait Pattern Within Functional Limits   Ambulation Surface Level;Indoor   Gait  velocity 7.6 seconds or 4.3 ft/sec   Stairs Yes   Stairs Assistance 6: Modified independent (Device/Increase time)   Stair Management Technique No rails;Alternating pattern;Forwards   Number of Stairs 8   Height of Stairs 6   Ramp 6: Modified independent (Device)   Curb 6: Modified independent (Device/increase time)     Standardized Balance Assessment   Standardized Balance Assessment Five Times Sit to Stand;10 meter walk test;Berg Balance Test   Five times sit to stand comments  10.7     Berg Balance Test   Sit to Stand Able to stand without using hands and stabilize independently   Standing Unsupported Able to stand safely 2 minutes   Sitting with Back Unsupported but Feet Supported on Floor or Stool Able to sit safely and securely 2 minutes   Stand to Sit Sits safely with minimal use of hands   Transfers Able to transfer safely, minor use of hands   Standing Unsupported with Eyes Closed Able to stand 10 seconds safely   Standing Ubsupported with Feet Together Able to place feet together independently and stand 1 minute safely   From Standing, Reach Forward with Outstretched Arm Can reach confidently >25 cm (10")   From Standing Position, Pick up Object from Floor Able to pick up shoe safely and easily   From Standing Position, Turn to Look Behind Over each Shoulder Looks behind from both sides and weight shifts well   Turn 360 Degrees Able to turn 360 degrees safely in 4 seconds or less   Standing Unsupported, Alternately Place Feet on Step/Stool Able to stand independently and safely and complete 8 steps in 20 seconds   Standing Unsupported, One Foot in Front Able to plae foot ahead of the other independently and hold 30 seconds   Standing on One Leg Able to lift leg independently and hold > 10 seconds   Total Score 55   Berg comment: 55/56            Objective measurements completed on examination: See above findings.                  PT Education - 04/10/17  1032    Education provided Yes   Education Details discussed clinical findings and no true deficits for PT address; PT will not pick up pt at this point   Person(s) Educated Patient   Methods Explanation   Comprehension Verbalized understanding                     Plan - 04/10/17 1033  Clinical Impression Statement Pt is a 61 year old female presenting to OPPT neuro for PT evaluation for L CVA with R hemiparesis. Pt's PMH significant for the following: HTN, hyperlipidemia, chronic kidney disease, MI, pulmonary nodule, retroperitoneal lymphadenopathy, ITP, Castleman's disease of lymph nodes, bilat carpal tunnel with carpal tunnel release surgery. The following deficits were noted during pt's exam: impaired sensation, coordination, strength in RUE and dysarthria which are currently being addressed by OT and SLP. Pt's gait speed is [facility] and indicates pt is safe to ambulate in community. Pt's BERG score indicates pt is at low risk for falls.  Pt is fully independent at home and in community with mobility, continues to drive and has had no falls.  Currently, pt does not present with LE strength, balance or gait deficits and PT will not follow pt at this time.  Pt to continue with OT and SLP to address UE and speech impairments.   History and Personal Factors relevant to plan of care: chronic UE weakness and impaired sensation due to carpal tunnel and h/o multiple CVA, MI, multiple risk factors for recurrent CVA   Clinical Presentation Evolving   Clinical Presentation due to: chronic UE weakness and impaired sensation due to carpal tunnel and h/o multiple CVA, MI, multiple risk factors for recurrent CVA   Clinical Decision Making Moderate   PT Frequency One time visit   Consulted and Agree with Plan of Care Patient      Visit Diagnosis: Other symptoms and signs involving the nervous system  Other disturbances of skin sensation  Muscle weakness (generalized)      G-Codes -  2017/04/13 1040    Functional Assessment Tool Used (Outpatient Only) BERG 55/56, gait velocity >4 ft/sec, five times sit to stand 10 sec   Functional Limitation Mobility: Walking and moving around   Mobility: Walking and Moving Around Current Status [phone]) 0 percent impaired, limited or restricted   Mobility: Walking and Moving Around Goal Status [phone]) 0 percent impaired, limited or restricted   Mobility: Walking and Moving Around Discharge Status [phone]) 0 percent impaired, limited or restricted       Problem List Patient Active Problem List   Diagnosis Date Noted  . CVA (cerebral vascular accident) ([place]) 03/09/2017  . Hypertension 03/09/2017  . Right arm weakness 03/09/2017  . Clumsiness on examination 02/23/2017  . Facial droop 02/23/2017  . Carpal tunnel syndrome on right 02/23/2017  . Right upper quadrant abdominal pain 02/03/2017  . Bell's palsy 02/03/2017  . Leukocytosis 02/03/2017  . Pulmonary nodule 10/10/2016  . 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 Idiopathic thrombocytopenic purpura ([place]) 06/17/2011  . Castleman disease ([place]) 06/13/2011  . Hypothyroidism 10/08/2010  . UNSPECIFIED DISORDER OF ADRENAL GLANDS 10/08/2010  . Coronary atherosclerosis 10/08/2010  . CONSTIPATION 10/08/2010  . Enlarged lymph nodes 10/08/2010    Raylene Everts, PT, DPT 04-13-2017    10:44 AM    [facility] [address] [place], [place], [address] Phone: [phone]   Fax:  [phone]  Name: [name] MRN: [id number] Date of Birth: [birth year]

## 2017-04-11 ENCOUNTER — Ambulatory Visit (HOSPITAL_BASED_OUTPATIENT_CLINIC_OR_DEPARTMENT_OTHER): Payer: Medicare HMO | Admitting: Internal Medicine

## 2017-04-11 ENCOUNTER — Encounter: Payer: Self-pay | Admitting: Internal Medicine

## 2017-04-11 ENCOUNTER — Telehealth: Payer: Self-pay

## 2017-04-11 VITALS — BP 139/91 | HR 83 | Temp 97.9°F | Resp 18 | Ht 63.0 in | Wt 172.9 lb

## 2017-04-11 DIAGNOSIS — R599 Enlarged lymph nodes, unspecified: Secondary | ICD-10-CM

## 2017-04-11 DIAGNOSIS — D693 Immune thrombocytopenic purpura: Secondary | ICD-10-CM

## 2017-04-11 DIAGNOSIS — E039 Hypothyroidism, unspecified: Secondary | ICD-10-CM | POA: Diagnosis not present

## 2017-04-11 DIAGNOSIS — D47Z2 Castleman disease: Secondary | ICD-10-CM

## 2017-04-11 DIAGNOSIS — G510 Bell's palsy: Secondary | ICD-10-CM

## 2017-04-11 NOTE — Progress Notes (Signed)
[facility] [phone]   [phone]  OFFICE PROGRESS NOTE  [name], MD [address] [place] [address]  DIAGNOSIS:  1. Castleman's Disease  2. Idiopathic Thrombocytopenic Purpura. 3. 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  2) Weekly rituximab at 375 mg per meter squared with first cycle given in the hospital. Now status post 2 partial cycles and 7 complete cycles.  3) Maintenance Rituxan 375 mg/M2 every 3 months, status post 12 cycles.  CURRENT THERAPY: Observation.  INTERVAL HISTORY: [name] 61 y.o. female returns to the clinic today for follow-up visit. The patient is feeling fine today was no specific complaints. She was recently diagnosed with acute stroke. She denied having any current chest pain, shortness of breath, cough or hemoptysis. She denied having any fever or chills. She has no nausea, vomiting, diarrhea or constipation. She had repeat CT scan of the chest, abdomen and pelvis performed recently and she is here for evaluation and discussion of her scan results.   MEDICAL HISTORY: Past Medical History:  Diagnosis Date  . 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 Chronic kidney disease    failure  . Heart attack ([place]) 2007  . Heart attack ([place]) 2007  . Hypertension   . Hypothyroidism   . ITP (idiopathic thrombocytopenic purpura) 06/17/2011  . Pulmonary nodule 10/10/2016  . Retroperitoneal lymphadenopathy 2012  . Stroke [facility])     ALLERGIES:  is allergic to cefdinir; levofloxacin; and vicodin [hydrocodone-acetaminophen].  MEDICATIONS:  Current Outpatient Prescriptions  Medication Sig Dispense Refill  . ALPRAZolam (XANAX) 0.25 MG tablet Take 0.5 mg by mouth at bedtime as needed for sleep. For sleep    . aspirin 81 MG chewable tablet Chew 81 mg by mouth daily.    .  clopidogrel (PLAVIX) 75 MG tablet Take 1 tablet (75 mg total) by mouth daily. 90 tablet 1  . levothyroxine (SYNTHROID, LEVOTHROID) 112 MCG tablet Take 112 mcg by mouth daily before breakfast.     . metoprolol succinate (TOPROL-XL) 25 MG 24 hr tablet Take 12.5 mg by mouth daily.     . pantoprazole (PROTONIX) 20 MG tablet Take 20 mg by mouth daily.    . rosuvastatin (CRESTOR) 40 MG tablet Take 1 tablet (40 mg total) by mouth daily. 90 tablet 1   No current facility-administered medications for this visit.    Facility-Administered Medications Ordered in Other Visits  Medication Dose Route Frequency Provider Last Rate Last Dose  . sodium chloride 0.9 % injection 10 mL  10 mL Intracatheter PRN Curt Bears, MD        SURGICAL HISTORY:  Past Surgical History:  Procedure Laterality Date  . APPENDECTOMY     61 years old  . CESAREAN SECTION  1983 1989    REVIEW OF SYSTEMS:  A comprehensive review of systems was negative.   PHYSICAL EXAMINATION: General appearance: alert, cooperative and no distress Head: Normocephalic, without obvious abnormality, atraumatic Neck: no adenopathy Lymph nodes: Cervical, supraclavicular, and axillary nodes normal. Resp: clear to auscultation bilaterally Back: symmetric, no curvature. ROM normal. No CVA tenderness. Cardio: regular rate and rhythm, S1, S2 normal, no murmur, click, rub or gallop GI: soft, non-tender; bowel sounds normal; no masses,  no organomegaly Extremities: extremities normal, atraumatic, no cyanosis or edema  ECOG PERFORMANCE STATUS: 1 - Symptomatic but completely ambulatory  Blood pressure (!) 139/91,  pulse 83, temperature 97.9 F (36.6 C), temperature source Oral, resp. rate 18, height 5\' 3"  (1.6 m), weight 172 lb 14.4 oz (78.4 kg), SpO2 100 %.  LABORATORY DATA: Lab Results  Component Value Date   WBC 9.8 04/07/2017   HGB 14.5 04/07/2017   HCT 43.7 04/07/2017   MCV 92.2 04/07/2017   PLT 327 04/07/2017      Chemistry        Component Value Date/Time   NA 142 04/07/2017 1017   K 4.5 04/07/2017 1017   CL 104 03/09/2017 1139   CL 106 01/18/2013 0851   CO2 28 04/07/2017 1017   BUN 10.4 04/07/2017 1017   CREATININE 0.8 04/07/2017 1017      Component Value Date/Time   CALCIUM 9.9 04/07/2017 1017   ALKPHOS 116 04/07/2017 1017   AST 18 04/07/2017 1017   ALT 11 04/07/2017 1017   BILITOT 0.38 04/07/2017 1017       RADIOGRAPHIC STUDIES: Ct Chest W Contrast  Result Date: 04/07/2017 CLINICAL DATA:  Castleman's disease. EXAM: CT CHEST, ABDOMEN, AND PELVIS WITH CONTRAST TECHNIQUE: Multidetector CT imaging of the chest, abdomen and pelvis was performed following the standard protocol during bolus administration of intravenous contrast. CONTRAST:  149mL ISOVUE-300 IOPAMIDOL (ISOVUE-300) INJECTION 61% COMPARISON:  Abdomen and pelvis CT 02/02/2017.  Chest CT 10/06/2016. FINDINGS: CT CHEST FINDINGS Cardiovascular: The heart size is normal. No pericardial effusion. Coronary artery calcification is evident. Atherosclerotic calcification is noted in the wall of the thoracic aorta. Mediastinum/Nodes: No mediastinal lymphadenopathy. There is no hilar lymphadenopathy. The esophagus has normal imaging features. 11 mm left subpectoral lymph nodes seen on the prior study is now 10 mm. 12 mm short axis left axillary lymph node is stable on today's study (image 14 series 2). One particular inferior left axillary lymph node seen posteriorly on image 17 of series 2 has progressed minimally in the interval measuring 9 mm short axis today compared to 5 mm on the prior study. Other upper normal to borderline left axillary lymph nodes are similar.Right subpectoral and axillary lymph nodes are normal to upper normal size and similar to prior. Lungs/Pleura: Centrilobular and paraseptal emphysema noted 6 mm nodule seen medial left upper lobe measures 4 mm today (image 31 series 4). No other suspicious pulmonary nodule or mass. No focal airspace  consolidation. No pulmonary edema or pleural effusion. 3 Musculoskeletal: Bone windows reveal no worrisome lytic or sclerotic osseous lesions. CT ABDOMEN PELVIS FINDINGS Hepatobiliary: No focal abnormality within the liver parenchyma. Small calcified gallstones evident. No intrahepatic or extrahepatic biliary dilation. Pancreas: No focal mass lesion. No dilatation of the main duct. No intraparenchymal cyst. No peripancreatic edema. Spleen: No splenomegaly. No focal mass lesion. Adrenals/Urinary Tract: No adrenal nodule or mass. Kidneys unremarkable. Tiny cortical hypodensities in each kidney are too small to characterize but likely represent cysts. No evidence for hydroureter. The urinary bladder appears normal for the degree of distention. Stomach/Bowel: Stomach is nondistended. No gastric wall thickening. No evidence of outlet obstruction. Duodenum is normally positioned as is the ligament of Treitz. No small bowel wall thickening. No small bowel dilatation. The terminal ileum is normal. The appendix is not visualized, but there is no edema or inflammation in the region of the cecum. Diverticular changes are noted in the left colon without evidence of diverticulitis. Vascular/Lymphatic: There is abdominal aortic atherosclerosis without aneurysm. Stable to slight progression of upper abdominal lymphadenopathy evident. Gastrohepatic ligament lymph node seen image 58 series 2 today measures 16 x 12 mm  compared to 14 x 9 mm when I remeasure it on prior study of 10/06/2016. Index left para-aortic lymph node measured on the prior study at 10 x 14 mm now measures 14 x 16 mm (image 71 series 2). Preaortic lymph node measured on the prior study at 7 mm short axis is similar at 8 mm short axis today (image 67 series 2). No pelvic sidewall lymphadenopathy Reproductive: The uterus has normal CT imaging appearance. There is no adnexal mass. Other: No intraperitoneal free fluid. Musculoskeletal: Bone windows reveal no worrisome  lytic or sclerotic osseous lesions. IMPRESSION: 1. Overall largely stable exam. Many of the upper normal to borderline increased lymph nodes seen in the subpectoral regions, axillae, and upper abdomen are similar although some lymph nodes in these areas do show evidence of minimal interval increase in size. 2. 6 mm nodule medial left upper lobe on the prior study has decreased to 4 mm on the current study. 3.  Emphysema. (ICD10-J43.9) 4.  Aortic Atherosclerois (ICD10-170.0) 5. Cholelithiasis Electronically Signed   By: Misty [facility] M.D.   On: 04/07/2017 14:50   Ct Abdomen Pelvis W Contrast  Result Date: 04/07/2017 CLINICAL DATA:  Castleman's disease. EXAM: CT CHEST, ABDOMEN, AND PELVIS WITH CONTRAST TECHNIQUE: Multidetector CT imaging of the chest, abdomen and pelvis was performed following the standard protocol during bolus administration of intravenous contrast. CONTRAST:  151mL ISOVUE-300 IOPAMIDOL (ISOVUE-300) INJECTION 61% COMPARISON:  Abdomen and pelvis CT 02/02/2017.  Chest CT 10/06/2016. FINDINGS: CT CHEST FINDINGS Cardiovascular: The heart size is normal. No pericardial effusion. Coronary artery calcification is evident. Atherosclerotic calcification is noted in the wall of the thoracic aorta. Mediastinum/Nodes: No mediastinal lymphadenopathy. There is no hilar lymphadenopathy. The esophagus has normal imaging features. 11 mm left subpectoral lymph nodes seen on the prior study is now 10 mm. 12 mm short axis left axillary lymph node is stable on today's study (image 14 series 2). One particular inferior left axillary lymph node seen posteriorly on image 17 of series 2 has progressed minimally in the interval measuring 9 mm short axis today compared to 5 mm on the prior study. Other upper normal to borderline left axillary lymph nodes are similar.Right subpectoral and axillary lymph nodes are normal to upper normal size and similar to prior. Lungs/Pleura: Centrilobular and paraseptal emphysema noted 6 mm  nodule seen medial left upper lobe measures 4 mm today (image 31 series 4). No other suspicious pulmonary nodule or mass. No focal airspace consolidation. No pulmonary edema or pleural effusion. 3 Musculoskeletal: Bone windows reveal no worrisome lytic or sclerotic osseous lesions. CT ABDOMEN PELVIS FINDINGS Hepatobiliary: No focal abnormality within the liver parenchyma. Small calcified gallstones evident. No intrahepatic or extrahepatic biliary dilation. Pancreas: No focal mass lesion. No dilatation of the main duct. No intraparenchymal cyst. No peripancreatic edema. Spleen: No splenomegaly. No focal mass lesion. Adrenals/Urinary Tract: No adrenal nodule or mass. Kidneys unremarkable. Tiny cortical hypodensities in each kidney are too small to characterize but likely represent cysts. No evidence for hydroureter. The urinary bladder appears normal for the degree of distention. Stomach/Bowel: Stomach is nondistended. No gastric wall thickening. No evidence of outlet obstruction. Duodenum is normally positioned as is the ligament of Treitz. No small bowel wall thickening. No small bowel dilatation. The terminal ileum is normal. The appendix is not visualized, but there is no edema or inflammation in the region of the cecum. Diverticular changes are noted in the left colon without evidence of diverticulitis. Vascular/Lymphatic: There is abdominal aortic atherosclerosis without  aneurysm. Stable to slight progression of upper abdominal lymphadenopathy evident. Gastrohepatic ligament lymph node seen image 58 series 2 today measures 16 x 12 mm compared to 14 x 9 mm when I remeasure it on prior study of 10/06/2016. Index left para-aortic lymph node measured on the prior study at 10 x 14 mm now measures 14 x 16 mm (image 71 series 2). Preaortic lymph node measured on the prior study at 7 mm short axis is similar at 8 mm short axis today (image 67 series 2). No pelvic sidewall lymphadenopathy Reproductive: The uterus has  normal CT imaging appearance. There is no adnexal mass. Other: No intraperitoneal free fluid. Musculoskeletal: Bone windows reveal no worrisome lytic or sclerotic osseous lesions. IMPRESSION: 1. Overall largely stable exam. Many of the upper normal to borderline increased lymph nodes seen in the subpectoral regions, axillae, and upper abdomen are similar although some lymph nodes in these areas do show evidence of minimal interval increase in size. 2. 6 mm nodule medial left upper lobe on the prior study has decreased to 4 mm on the current study. 3.  Emphysema. (ICD10-J43.9) 4.  Aortic Atherosclerois (ICD10-170.0) 5. Cholelithiasis Electronically Signed   By: Misty [facility] M.D.   On: 04/07/2017 14:50   ASSESSMENT AND PLAN:  This is a very pleasant 61 years old white female with Castleman disease status post induction treatment with Rituxan followed by maintenance Rituxan for 12 cycles. The patient is currently on observation and she is feeling fine today with no specific complaints. Her recent CT scan of the chest showed no evidence for disease progression. I discussed the scan results with the patient today. I recommended for her to continue on observation with repeat blood work and CT scan in 6 months. She was advised to call immediately if she has any concerning symptoms in the interval. All questions were answered. The patient knows to call the clinic with any problems, questions or concerns. We can certainly see the patient much sooner if necessary. I spent 10 minutes counseling the patient face to face. The total time spent in the appointment was 15 minutes.  Disclaimer: This note was dictated with voice recognition software. Similar sounding words can inadvertently be transcribed and may not be corrected upon review.

## 2017-04-11 NOTE — Telephone Encounter (Signed)
Printed avs and calender for March per 9/11 los

## 2017-04-11 NOTE — Patient Instructions (Signed)
Steps to Quit Smoking Smoking tobacco can be bad for your health. It can also affect almost every organ in your body. Smoking puts you and people around you at risk for many serious long-lasting (chronic) diseases. Quitting smoking is hard, but it is one of the best things that you can do for your health. It is never too late to quit. What are the benefits of quitting smoking? When you quit smoking, you lower your risk for getting serious diseases and conditions. They can include:  Lung cancer or lung disease.  Heart disease.  Stroke.  Heart attack.  Not being able to have children (infertility).  Weak bones (osteoporosis) and broken bones (fractures).  If you have coughing, wheezing, and shortness of breath, those symptoms may get better when you quit. You may also get sick less often. If you are pregnant, quitting smoking can help to lower your chances of having a baby of low birth weight. What can I do to help me quit smoking? Talk with your doctor about what can help you quit smoking. Some things you can do (strategies) include:  Quitting smoking totally, instead of slowly cutting back how much you smoke over a period of time.  Going to in-person counseling. You are more likely to quit if you go to many counseling sessions.  Using resources and support systems, such as: ? Online chats with a counselor. ? Phone quitlines. ? Printed self-help materials. ? Support groups or group counseling. ? Text messaging programs. ? Mobile phone apps or applications.  Taking medicines. Some of these medicines may have nicotine in them. If you are pregnant or breastfeeding, do not take any medicines to quit smoking unless your doctor says it is okay. Talk with your doctor about counseling or other things that can help you.  Talk with your doctor about using more than one strategy at the same time, such as taking medicines while you are also going to in-person counseling. This can help make  quitting easier. What things can I do to make it easier to quit? Quitting smoking might feel very hard at first, but there is a lot that you can do to make it easier. Take these steps:  Talk to your family and friends. Ask them to support and encourage you.  Call phone quitlines, reach out to support groups, or work with a counselor.  Ask people who smoke to not smoke around you.  Avoid places that make you want (trigger) to smoke, such as: ? Bars. ? Parties. ? Smoke-break areas at work.  Spend time with people who do not smoke.  Lower the stress in your life. Stress can make you want to smoke. Try these things to help your stress: ? Getting regular exercise. ? Deep-breathing exercises. ? Yoga. ? Meditating. ? Doing a body scan. To do this, close your eyes, focus on one area of your body at a time from head to toe, and notice which parts of your body are tense. Try to relax the muscles in those areas.  Download or buy apps on your mobile phone or tablet that can help you stick to your quit plan. There are many free apps, such as QuitGuide from the CDC (Centers for Disease Control and Prevention). You can find more support from smokefree.gov and other websites.  This information is not intended to replace advice given to you by your health care provider. Make sure you discuss any questions you have with your health care provider. Document Released: 05/14/2009 Document   Revised: 03/15/2016 Document Reviewed: 12/02/2014 Elsevier Interactive Patient Education  2018 Elsevier Inc.  

## 2017-04-25 ENCOUNTER — Encounter: Payer: Medicare HMO | Admitting: Speech Pathology

## 2017-04-25 ENCOUNTER — Encounter: Payer: Medicare HMO | Admitting: Occupational Therapy

## 2017-04-25 DIAGNOSIS — E038 Other specified hypothyroidism: Secondary | ICD-10-CM | POA: Diagnosis not present

## 2017-04-25 DIAGNOSIS — I10 Essential (primary) hypertension: Secondary | ICD-10-CM | POA: Diagnosis not present

## 2017-04-25 DIAGNOSIS — E784 Other hyperlipidemia: Secondary | ICD-10-CM | POA: Diagnosis not present

## 2017-04-27 ENCOUNTER — Ambulatory Visit: Payer: Medicare HMO | Admitting: Speech Pathology

## 2017-04-27 ENCOUNTER — Encounter: Payer: Medicare HMO | Admitting: Speech Pathology

## 2017-04-27 ENCOUNTER — Ambulatory Visit: Payer: Medicare HMO | Admitting: Occupational Therapy

## 2017-04-27 DIAGNOSIS — R471 Dysarthria and anarthria: Secondary | ICD-10-CM

## 2017-04-27 DIAGNOSIS — I69351 Hemiplegia and hemiparesis following cerebral infarction affecting right dominant side: Secondary | ICD-10-CM | POA: Diagnosis not present

## 2017-04-27 DIAGNOSIS — M6281 Muscle weakness (generalized): Secondary | ICD-10-CM

## 2017-04-27 DIAGNOSIS — R278 Other lack of coordination: Secondary | ICD-10-CM

## 2017-04-27 DIAGNOSIS — R4701 Aphasia: Secondary | ICD-10-CM

## 2017-04-27 NOTE — Therapy (Signed)
[facility] [address] [place], [place], [address] Phone: [phone]   Fax:  [phone]  Speech Language Pathology Treatment  Patient Details  Name: [name] MRN: [id number] Date of Birth: September 30, 1955 Referring Provider: Garvin Fila, MD  Encounter Date: 04/27/2017      End of Session - 04/27/17 1951    Visit Number 2   Number of Visits 17   Date for SLP Re-Evaluation 06/09/17   SLP Start Time 1316   SLP Stop Time  1357   SLP Time Calculation (min) 41 min   Activity Tolerance Patient tolerated treatment well      Past Medical History:  Diagnosis Date  . 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 Chronic kidney disease    failure  . Heart attack ([place]) 2007  . Heart attack ([place]) 2007  . Hypertension   . Hypothyroidism   . ITP (idiopathic thrombocytopenic purpura) 06/17/2011  . Pulmonary nodule 10/10/2016  . Retroperitoneal lymphadenopathy 2012  . Stroke [facility])     Past Surgical History:  Procedure Laterality Date  . APPENDECTOMY     61 years old  . [place]    There were no vitals filed for this visit.      Subjective Assessment - 04/27/17 1316    Subjective "i've been prac-t- practicing those exercises you gave me."   Currently in Pain? No/denies               ADULT SLP TREATMENT - 04/27/17 1317      General Information   Behavior/Cognition Cooperative;Alert   Patient Positioning Upright in chair   Oral care provided N/A     Treatment Provided   Treatment provided Cognitive-Linquistic     Pain Assessment   Pain Assessment No/denies pain     Cognitive-Linquistic Treatment   Treatment focused on Dysarthria;Aphasia;Apraxia;Patient/family/caregiver education   Skilled Treatment Pt states her speech is less intelligible, more effortful at the end of the day. Reports completing HEP for dysarthria daily. SLP educated in [place] strategies, focusing on slowed speech,  overarticulation and pausing. Pt implements at monosyllabic word level with demonstration and rare min A. Benefitted from cues to swallow saliva as she progressed to multisyllabic words. In short conversational responses, pt required frequent min A for rate, overarticulation and swallows to improve intelligibility. At paragraph level, pt demos strategies with 70% accuracy, demonstrates intermittent self-monitoring and self-correction. SLP provided education re: rationale for dysarthria compensatory strategies as well as speech vs non-speech tasks for dysarthria HEP. Education re: prognosis, process of stroke recovery, concept of neuroplasticity in response to pt's questions today.     Assessment / Recommendations / Plan   Plan Continue with current plan of care     Progression Toward Goals   Progression toward goals Progressing toward goals          SLP Education - 04/27/17 1950    Education provided Yes   Education Details rationale for HEP, process of stroke recovery, neuroplasticity, SLOP strategies   Person(s) Educated Patient   Methods Explanation;Handout   Comprehension Verbalized understanding          SLP Short Term Goals - 04/27/17 1953      SLP SHORT TERM GOAL #1   Title pt will perform dysarthria HEP with rare min A over three sessions   Time 4   Period Weeks   Status On-going     SLP SHORT TERM GOAL #2   Title pt will demo  compensations for apraxia/dysarthria/aphasia in 5 minutes simple conversation to achieve 100% intelligibility   Time 4   Period Weeks   Status On-going          SLP Long Term Goals - 04/27/17 1953      SLP LONG TERM GOAL #1   Title pt will perform dysarthria HEP with modified independence over three sessions   Time 8   Period Weeks   Status On-going     SLP LONG TERM GOAL #2   Title pt will participate in 15 minutes mod complex conversation using compensations for dysarthria/apraxia/aphasia, in order to achieve 100% intelligibility over  3 sessions   Time 8   Period Weeks   Status On-going          Plan - 04/27/17 1951    Clinical Impression Statement Patient presents with mild dysarthria and 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 In conversation today pt's speech was functional, though with hesitations, circumlocutions noted in language-based tasks. In conversation, pt is 95% intelligible, but with increasing rate of speech she is noted with more frequent errors hindering her intelligibility (85% with increased rate of speech). She reports reduced quality of life as a result of the above deficits and would benefit from skilled ST to address dysarthria, apraxia, and aphasia.   Speech Therapy Frequency 2x / week   Treatment/Interventions Language facilitation;Cueing hierarchy;SLP instruction and feedback;Functional tasks;Compensatory strategies;Multimodal communcation approach;Patient/family education   Potential to Achieve Goals Good   SLP Home Exercise Plan HEP for dysarthria, SLOP strategies   Consulted and Agree with Plan of Care Patient      Patient will benefit from skilled therapeutic intervention in order to improve the following deficits and impairments:   Dysarthria and anarthria  Aphasia    Problem List Patient Active Problem List   Diagnosis Date Noted  . CVA (cerebral vascular accident) ([place]) 03/09/2017  . Hypertension 03/09/2017  . Right arm weakness 03/09/2017  . Clumsiness on examination 02/23/2017  . Facial droop 02/23/2017  . Carpal tunnel syndrome on right 02/23/2017  . Right upper quadrant abdominal pain 02/03/2017  . Bell's palsy 02/03/2017  . Leukocytosis 02/03/2017  . Pulmonary nodule 10/10/2016  . 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 Idiopathic thrombocytopenic purpura ([place]) 06/17/2011  . Castleman disease ([place]) 06/13/2011  . Hypothyroidism 10/08/2010  . UNSPECIFIED DISORDER OF ADRENAL GLANDS 10/08/2010  . Coronary atherosclerosis 10/08/2010  .  CONSTIPATION 10/08/2010  . Enlarged lymph nodes 10/08/2010   Deneise Lever, [place], CCC-SLP Speech-Language Pathologist  Aliene Altes 04/27/2017, 7:54 PM  [facility] [address] [place], [place], [address] Phone: [phone]   Fax:  [phone]   Name: [name] MRN: [id number] Date of Birth: 09/23/1955

## 2017-04-27 NOTE — Patient Instructions (Addendum)
  1. Grip Strengthening (Resistive Putty)   Squeeze putty using thumb and all fingers. Repeat _20___ times. Do __2__ sessions per day.   2. Roll putty into tube on table and pinch between each finger and thumb x 10 reps each. (Do ring and small finger together)    Coordination Activities  Perform the following activities for 15 minutes 2 times per day with right hand(s).   Rotate ball in fingertips (clockwise and counter-clockwise).  Toss ball in air and catch with the same hand.  Flip cards 1 at a time as fast as you can.  Deal cards with your thumb (Hold deck in hand and push card off top with thumb).  Rotate 1 card in hand (clockwise and counter-clockwise).  Pick up pennies and stack (stacks of 5)  Pick up nickels or quarters one at a time until you get 5 in your hand, then move coins from palm to fingertips to stack one at a time.  Practice writing and typing.

## 2017-04-27 NOTE — Patient Instructions (Signed)
S   Slow L   Loud O  Overarticulate P   Pause   Please complete the assigned speech therapy homework prior to your next session and return it to the speech therapist at your next visit.  Read the handouts using the SLOP strategies.

## 2017-04-27 NOTE — Therapy (Signed)
[facility] [address] [place], [place], [address] Phone: [phone]   Fax:  [phone]  Occupational Therapy Treatment  Patient Details  Name: [name] MRN: [id number] Date of Birth: June 03, 1956 Referring Provider: Dr. Leonie Man  Encounter Date: 04/27/2017      OT End of Session - 04/27/17 1443    Visit Number 2   Number of Visits 16   Date for OT Re-Evaluation 06/08/17   Authorization Type [employer] MCR - no visit limt or auth required.   Will need G code and PN every 10th visit   Authorization Time Period 60 days   Authorization - Visit Number 2   Authorization - Number of Visits 10   OT Start Time 1400   OT Stop Time 1443   OT Time Calculation (min) 43 min   Activity Tolerance Patient tolerated treatment well      Past Medical History:  Diagnosis Date  . 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 Chronic kidney disease    failure  . Heart attack ([place]) 2007  . Heart attack ([place]) 2007  . Hypertension   . Hypothyroidism   . ITP (idiopathic thrombocytopenic purpura) 06/17/2011  . Pulmonary nodule 10/10/2016  . Retroperitoneal lymphadenopathy 2012  . Stroke [facility])     Past Surgical History:  Procedure Laterality Date  . APPENDECTOMY     61 years old  . [place]    There were no vitals filed for this visit.      Subjective Assessment - 04/27/17 1406    Subjective  No pain, just numbness in my hand which was even before the stroke from CTS. The CTR 11/13/16 didn't help with the numbness. I didn't even notice anything worse in my hand from the stroke   Pertinent History see epic. Pt s/p subacute L frontal stroke, Pt was diagnosed with Bell's palsy on 02/03/2017 however this progressed. Pt also with H/o of carpal tunnel release in R hand.  MRI also showed old CVA   Patient Stated Goals Pt very tangential - required redirection to answer goal for therapy.  I want to be able to write and have my right  hand work better.    Currently in Pain? No/denies                      OT Treatments/Exercises (OP) - 04/27/17 0001      Exercises   Exercises Hand     Hand Exercises   Other Hand Exercises Pt issued putty HEP and issued red putty - see pt instructions for details     Fine Motor Coordination   Other Fine Motor Exercises Issued coordination HEP - See pt instructions for details   Other Fine Motor Exercises Pt practiced pre-writing ex's including tracing letters with highlighter and distal finger control worksheet with mod to max difficulty                OT Education - 04/27/17 1426    Education provided Yes   Education Details putty and coordination HEP    Person(s) Educated Patient   Methods Explanation;Demonstration;Handout;Verbal cues   Comprehension Verbalized understanding;Returned demonstration          OT Short Term Goals - 04/27/17 1444      OT SHORT TERM GOAL #1   Title Pt will be mod I for HEP for coordination and grip/RUE strength for RUE - 05/08/2017   Status On-going     OT  SHORT TERM GOAL #2   Title Pt will demonstrate improved grip strength by at least 5 pounds to assist in opening jars (baseline = 25)   Status New     OT SHORT TERM GOAL #3   Title Pt will demonstrate improve coordination as evidenced by decreasing time on 9 hole peg by at least 5 seconds to improve ease in buttoning, zippng and tying   Status New           OT Long Term Goals - 04/10/17 1227      OT LONG TERM GOAL #1   Title Pt will  be mod I with upgrade HEP prn - 06/05/2017   Status New     OT LONG TERM GOAL #2   Title Pt will demonstrate improve grip strength by at least 8 pounds to assist with functional home mgmt tasks.  (baseline = 25)     OT LONG TERM GOAL #3   Title Pt will demonstrate ability to pick up 5 pound object from overhead shelf x3 without dropping   Status New     OT LONG TERM GOAL #4   Title Pt will demonstrate improved coordination  as evidenced by decreasing time on 9 hole peg by at least 8 seconds to assist in tying, and homemaking tasks.    Status New               Plan - 04/27/17 1444    Clinical Impression Statement Pt reports impairments Rt hand prior to CVA from CTS and CTR. Pt with numbness entire Rt hand which affects coordination   Rehab Potential Good   OT Frequency 2x / week   OT Duration 8 weeks   OT Treatment/Interventions Self-care/ADL training;Electrical Stimulation;Therapeutic exercise;Neuromuscular education;DME and/or AE instruction;Therapeutic activities;Patient/family education   Plan review coordination HEP prn, continue coordination and grip strength   Consulted and Agree with Plan of Care Patient      Patient will benefit from skilled therapeutic intervention in order to improve the following deficits and impairments:  Decreased coordination, Decreased knowledge of use of DME, Decreased strength, Impaired UE functional use, Impaired sensation  Visit Diagnosis: Other lack of coordination  Muscle weakness (generalized)    Problem List Patient Active Problem List   Diagnosis Date Noted  . CVA (cerebral vascular accident) ([place]) 03/09/2017  . Hypertension 03/09/2017  . Right arm weakness 03/09/2017  . Clumsiness on examination 02/23/2017  . Facial droop 02/23/2017  . Carpal tunnel syndrome on right 02/23/2017  . Right upper quadrant abdominal pain 02/03/2017  . Bell's palsy 02/03/2017  . Leukocytosis 02/03/2017  . Pulmonary nodule 10/10/2016  . 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 Idiopathic thrombocytopenic purpura ([place]) 06/17/2011  . Castleman disease ([place]) 06/13/2011  . Hypothyroidism 10/08/2010  . UNSPECIFIED DISORDER OF ADRENAL GLANDS 10/08/2010  . Coronary atherosclerosis 10/08/2010  . CONSTIPATION 10/08/2010  . Enlarged lymph nodes 10/08/2010    Carey Bullocks, OTR/L 04/27/2017, 2:46 PM  [facility] [address] [place], [place], [address] Phone: [phone]   Fax:  [phone]  Name: [name] MRN: [id number] Date of Birth: [birth year]

## 2017-05-02 ENCOUNTER — Encounter: Payer: Medicare HMO | Admitting: Occupational Therapy

## 2017-05-02 ENCOUNTER — Encounter: Payer: Medicare HMO | Admitting: Speech Pathology

## 2017-05-02 DIAGNOSIS — I2510 Atherosclerotic heart disease of native coronary artery without angina pectoris: Secondary | ICD-10-CM | POA: Diagnosis not present

## 2017-05-02 DIAGNOSIS — Z0000 Encounter for general adult medical examination without abnormal findings: Secondary | ICD-10-CM | POA: Diagnosis not present

## 2017-05-02 DIAGNOSIS — R599 Enlarged lymph nodes, unspecified: Secondary | ICD-10-CM | POA: Diagnosis not present

## 2017-05-02 DIAGNOSIS — I69951 Hemiplegia and hemiparesis following unspecified cerebrovascular disease affecting right dominant side: Secondary | ICD-10-CM | POA: Diagnosis not present

## 2017-05-02 DIAGNOSIS — I10 Essential (primary) hypertension: Secondary | ICD-10-CM | POA: Diagnosis not present

## 2017-05-02 DIAGNOSIS — Z1389 Encounter for screening for other disorder: Secondary | ICD-10-CM | POA: Diagnosis not present

## 2017-05-02 DIAGNOSIS — R69 Illness, unspecified: Secondary | ICD-10-CM | POA: Diagnosis not present

## 2017-05-02 DIAGNOSIS — E039 Hypothyroidism, unspecified: Secondary | ICD-10-CM | POA: Diagnosis not present

## 2017-05-02 DIAGNOSIS — E7849 Other hyperlipidemia: Secondary | ICD-10-CM | POA: Diagnosis not present

## 2017-05-02 DIAGNOSIS — Z6829 Body mass index (BMI) 29.0-29.9, adult: Secondary | ICD-10-CM | POA: Diagnosis not present

## 2017-05-03 ENCOUNTER — Encounter: Payer: Medicare HMO | Admitting: Occupational Therapy

## 2017-05-03 ENCOUNTER — Encounter: Payer: Medicare HMO | Admitting: Speech Pathology

## 2017-05-09 ENCOUNTER — Telehealth: Payer: Self-pay | Admitting: Neurology

## 2017-05-09 ENCOUNTER — Telehealth: Payer: Self-pay | Admitting: Internal Medicine

## 2017-05-09 NOTE — Telephone Encounter (Signed)
Patient is calling to discuss if she still needs to continue taking a baby aspirin. She has been on it for 2 months.

## 2017-05-09 NOTE — Telephone Encounter (Signed)
Rn call patient to give her the instructions:  Rn stated per Dr.Sethi note, she is to continue aspirin,and plavix till 05/28/2017. PT was told this on 03/28/2017 to continue both for 2 months. Per his note she is to discontinue aspirin two months from last visit. Pt is to take plavix ongoing after 10/282018. PT verbalized understanding.

## 2017-05-09 NOTE — Telephone Encounter (Signed)
Faxed records to [facility] [phone]

## 2017-05-10 ENCOUNTER — Ambulatory Visit: Payer: Medicare HMO | Admitting: Neurology

## 2017-05-10 ENCOUNTER — Encounter: Payer: Medicare HMO | Admitting: Speech Pathology

## 2017-05-10 ENCOUNTER — Encounter: Payer: Medicare HMO | Admitting: Occupational Therapy

## 2017-05-11 ENCOUNTER — Encounter: Payer: Medicare HMO | Admitting: Occupational Therapy

## 2017-05-11 ENCOUNTER — Encounter: Payer: Medicare HMO | Admitting: Speech Pathology

## 2017-05-15 ENCOUNTER — Encounter: Payer: Medicare HMO | Admitting: Speech Pathology

## 2017-05-15 ENCOUNTER — Encounter: Payer: Medicare HMO | Admitting: Occupational Therapy

## 2017-05-16 ENCOUNTER — Ambulatory Visit (INDEPENDENT_AMBULATORY_CARE_PROVIDER_SITE_OTHER): Payer: Medicare HMO | Admitting: Orthopaedic Surgery

## 2017-05-16 DIAGNOSIS — G5601 Carpal tunnel syndrome, right upper limb: Secondary | ICD-10-CM

## 2017-05-16 NOTE — Progress Notes (Signed)
Office Visit Note   Patient: [name]           Date of Birth: [birth year]           MRN: [id number] Visit Date: 05/16/2017              Requested by: [name], MD [place],  [id number] PCP: [name], MD   Assessment & Plan: Visit Diagnoses:  1. Carpal tunnel syndrome on right     Plan: I talked her in detail and length about her findings. Obviously her neurologic findings are subjective in terms of not having any sensation in her hand. When I look at the objective findings of her having nerve conduction study showing mild carpal tunnel syndrome at just under 4 months postoperative this seems consistent with a postoperative recovery. I do not have any studies that show the severity of her carpal tunnel syndrome prior to her surgery. From our experience with carpal tunnel disease/syndrome and postoperative recovery it can take up to a year to recover depending on the severity of the disease. The more recent nerve conduction studies I feel a been done too soon postoperative. It would be more worthwhile to repeat the studies at least a year out from her surgery. I explained this to her in detail and she seemed to have an understanding of this. Certainly there could be some ramifications from her stroke as well. I recommended that she return to her hand surgeon at 1 year postop. All questions and concerns were answered and addressed.  Follow-Up Instructions: Return if symptoms worsen or fail to improve.   Orders:  No orders of the defined types were placed in this encounter.  No orders of the defined types were placed in this encounter.     Procedures: No procedures performed   Clinical Data: No additional findings.   Subjective: No chief complaint on file. The patient is someone I'm seeing for the first time. She is a complicated history and this is second opinion as a relates to right carpal tunnel syndrome. Apparently she had been having  injections since about 2008 for right carpal tunnel syndrome. In April of this year she finally had a right open carpal tunnel release by hand surgeon here in town. She is still had numbness and tingling in her hand since then and says she can even feel anything in her thumb index and middle fingers at all. Also comp carrying things with the fact that she had a stroke this summer. She still complains of significant numbness and tingling in that right hand. In August of this year she had nerve conduction studies and they her on the system for me to review as well.  HPI  Review of Systems She currently denies any headache or chest pain. She denies any fever, chills, nausea, vomiting. She denies being a diabetic.  Objective: Vital Signs: There were no vitals taken for this visit.  Physical Exam She is alert and oriented 3 and in no acute distress Ortho Exam Examination of her right hand shows a well-healed incision over the transverse carpal ligament. She has palpable pulses in her wrist and all her fingers and thumb are well-perfused. She has full range of motion of her thumb and her fingers from the MCP down to the IP joints. She can make a full fist easily. There is no evidence of muscle atrophy at all. His no evidence infection. She has subjective numbness in the median nerve distribution. Specialty Comments:  No specialty comments available.  Imaging: No results found. I was able to review the nerve conduction velocity studies that were done in August which was just less than 4 months after her carpal tunnel release. This showed no evidence of ulnar neuropathy and it showed only mild carpal tunnel syndrome.  PMFS History: Patient Active Problem List   Diagnosis Date Noted  . CVA (cerebral vascular accident) ([place]) 03/09/2017  . Hypertension 03/09/2017  . Right arm weakness 03/09/2017  . Clumsiness on examination 02/23/2017  . Facial droop 02/23/2017  . Carpal tunnel syndrome on right  02/23/2017  . Right upper quadrant abdominal pain 02/03/2017  . Bell's palsy 02/03/2017  . Leukocytosis 02/03/2017  . Pulmonary nodule 10/10/2016  . 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 Idiopathic thrombocytopenic purpura ([place]) 06/17/2011  . Castleman disease ([place]) 06/13/2011  . Hypothyroidism 10/08/2010  . UNSPECIFIED DISORDER OF ADRENAL GLANDS 10/08/2010  . Coronary atherosclerosis 10/08/2010  . CONSTIPATION 10/08/2010  . Enlarged lymph nodes 10/08/2010   Past Medical History:  Diagnosis Date  . 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 Chronic kidney disease    failure  . Heart attack ([place]) 2007  . Heart attack ([place]) 2007  . Hypertension   . Hypothyroidism   . ITP (idiopathic thrombocytopenic purpura) 06/17/2011  . Pulmonary nodule 10/10/2016  . Retroperitoneal lymphadenopathy 2012  . Stroke [facility])     Family History  Problem Relation Age of Onset  . Cancer Mother        lung  . Heart disease Father   . Stroke Father     Past Surgical History:  Procedure Laterality Date  . APPENDECTOMY     61 years old  . [place]   Social History   Occupational History  . Not on file.   Social History Main Topics  . Smoking status: Current Every Day Smoker    Packs/day: 0.50    Years: 25.00    Types: Cigarettes  . Smokeless tobacco: Never Used     Comment: vapor cig sometimes  . Alcohol use 0.6 oz/week    1 Glasses of wine per week     Comment: "occasssionally"  . Drug use: No  . Sexual activity: Not Currently

## 2017-05-18 ENCOUNTER — Encounter: Payer: Medicare HMO | Admitting: Speech Pathology

## 2017-05-18 ENCOUNTER — Encounter: Payer: Medicare HMO | Admitting: Occupational Therapy

## 2017-05-22 ENCOUNTER — Encounter: Payer: Medicare HMO | Admitting: Speech Pathology

## 2017-05-22 ENCOUNTER — Encounter: Payer: Medicare HMO | Admitting: Occupational Therapy

## 2017-05-24 DIAGNOSIS — R69 Illness, unspecified: Secondary | ICD-10-CM | POA: Diagnosis not present

## 2017-05-24 DIAGNOSIS — Z716 Tobacco abuse counseling: Secondary | ICD-10-CM | POA: Diagnosis not present

## 2017-05-24 DIAGNOSIS — I2510 Atherosclerotic heart disease of native coronary artery without angina pectoris: Secondary | ICD-10-CM | POA: Diagnosis not present

## 2017-05-24 DIAGNOSIS — I639 Cerebral infarction, unspecified: Secondary | ICD-10-CM | POA: Diagnosis not present

## 2017-05-24 DIAGNOSIS — E785 Hyperlipidemia, unspecified: Secondary | ICD-10-CM | POA: Diagnosis not present

## 2017-05-24 DIAGNOSIS — E039 Hypothyroidism, unspecified: Secondary | ICD-10-CM | POA: Diagnosis not present

## 2017-05-24 DIAGNOSIS — I10 Essential (primary) hypertension: Secondary | ICD-10-CM | POA: Diagnosis not present

## 2017-05-24 DIAGNOSIS — I252 Old myocardial infarction: Secondary | ICD-10-CM | POA: Diagnosis not present

## 2017-05-24 DIAGNOSIS — Z720 Tobacco use: Secondary | ICD-10-CM | POA: Diagnosis not present

## 2017-05-25 ENCOUNTER — Encounter: Payer: Medicare HMO | Admitting: Occupational Therapy

## 2017-05-25 ENCOUNTER — Encounter: Payer: Medicare HMO | Admitting: Speech Pathology

## 2017-05-27 DIAGNOSIS — Z23 Encounter for immunization: Secondary | ICD-10-CM | POA: Diagnosis not present

## 2017-05-29 ENCOUNTER — Encounter: Payer: Medicare HMO | Admitting: Speech Pathology

## 2017-05-29 ENCOUNTER — Encounter: Payer: Medicare HMO | Admitting: Occupational Therapy

## 2017-06-01 ENCOUNTER — Encounter: Payer: Medicare HMO | Admitting: Occupational Therapy

## 2017-06-01 ENCOUNTER — Encounter: Payer: Medicare HMO | Admitting: Speech Pathology

## 2017-06-05 ENCOUNTER — Encounter: Payer: Medicare HMO | Admitting: Speech Pathology

## 2017-06-05 ENCOUNTER — Encounter: Payer: Medicare HMO | Admitting: Occupational Therapy

## 2017-06-08 ENCOUNTER — Encounter: Payer: Medicare HMO | Admitting: Speech Pathology

## 2017-06-08 ENCOUNTER — Encounter: Payer: Medicare HMO | Admitting: Occupational Therapy

## 2017-06-15 ENCOUNTER — Encounter: Payer: Self-pay | Admitting: Speech Pathology

## 2017-06-15 NOTE — Therapy (Signed)
SPEECH THERAPY DISCHARGE SUMMARY  Visits from Start of Care: 2  Current functional level related to goals / functional outcomes: See goals below. Pt requests d/c. Insufficient time to assess/attain functional outcomes.     Remaining deficits: See evaluation for details.   Education / Equipment: None recommended by SLP   Plan: Patient agrees to discharge.  Patient goals were not met. Patient is being discharged due to the patient's request.  ?????         SLP Short Term Goals - 06/15/17 1305      SLP SHORT TERM GOAL #1   Title  pt will perform dysarthria HEP with rare min A over three sessions    Status  Not Met      SLP SHORT TERM GOAL #2   Title  pt will demo compensations for apraxia/dysarthria/aphasia in 5 minutes simple conversation to achieve 100% intelligibility    Status  Not Met      SLP Long Term Goals - 06/15/17 1305      SLP LONG TERM GOAL #1   Title  pt will perform dysarthria HEP with modified independence over three sessions    Status  Not Met      SLP LONG TERM GOAL #2   Title  pt will participate in 15 minutes mod complex conversation using compensations for dysarthria/apraxia/aphasia, in order to achieve 100% intelligibility over 3 sessions    Status  [facility] [address] [place] [place], [place], [address] Phone: [phone]   Fax:  [phone]  Patient Details  Name: [name] MRN: [id number] Date of Birth: 07-Feb-1956 Referring Provider:  No ref. provider found  Encounter Date: 06/15/2017  Deneise Lever, [place], [place] 06/15/2017, 1:06 PM  [facility] [address] [place], [place], [address] Phone: [phone]   Fax:  [phone]

## 2017-07-06 ENCOUNTER — Telehealth (INDEPENDENT_AMBULATORY_CARE_PROVIDER_SITE_OTHER): Payer: Self-pay | Admitting: Orthopaedic Surgery

## 2017-07-06 NOTE — Telephone Encounter (Signed)
Please advise 

## 2017-07-06 NOTE — Telephone Encounter (Signed)
Patient called asked when can Dr Ninfa Linden do surgery on her hand. Patient advised she do not want to go back to [place]. The number to contact patient is [phone]

## 2017-07-13 NOTE — Telephone Encounter (Signed)
Patient aware per Ninfa Linden that it could take up to a year to recover from the carpal tunnel surgery

## 2017-07-13 NOTE — Telephone Encounter (Signed)
Patient left message requesting a return call if & when she could have carpal tunnel surgery.

## 2017-07-20 NOTE — Therapy (Signed)
[facility] [address] [place], [place], [address] Phone: [phone]   Fax:  [phone]  Patient Details  Name: [name] MRN: [id number] Date of Birth: [birth year] Referring Provider:  Dr. Leonie Man Encounter Date: 07/20/2017  Pt cancelled all remaining appointments on 05/08/17 secondary to reporting she may need surgery. Pt was only seen for evaluation and 1 treatment session. Last seen on 04/27/17 for OT. Will d/c episode of care at this time.   Carey Bullocks, OTR/L 07/20/2017, 8:24 AM  [facility] [address] [place], [place], [address] Phone: [phone]   Fax:  [phone]

## 2017-08-04 DIAGNOSIS — Z720 Tobacco use: Secondary | ICD-10-CM | POA: Diagnosis not present

## 2017-08-04 DIAGNOSIS — R599 Enlarged lymph nodes, unspecified: Secondary | ICD-10-CM | POA: Diagnosis not present

## 2017-08-04 DIAGNOSIS — R69 Illness, unspecified: Secondary | ICD-10-CM | POA: Diagnosis not present

## 2017-08-04 DIAGNOSIS — E7849 Other hyperlipidemia: Secondary | ICD-10-CM | POA: Diagnosis not present

## 2017-08-04 DIAGNOSIS — I10 Essential (primary) hypertension: Secondary | ICD-10-CM | POA: Diagnosis not present

## 2017-08-04 DIAGNOSIS — I69951 Hemiplegia and hemiparesis following unspecified cerebrovascular disease affecting right dominant side: Secondary | ICD-10-CM | POA: Diagnosis not present

## 2017-08-04 DIAGNOSIS — I2510 Atherosclerotic heart disease of native coronary artery without angina pectoris: Secondary | ICD-10-CM | POA: Diagnosis not present

## 2017-08-04 DIAGNOSIS — J310 Chronic rhinitis: Secondary | ICD-10-CM | POA: Diagnosis not present

## 2017-08-04 DIAGNOSIS — R202 Paresthesia of skin: Secondary | ICD-10-CM | POA: Diagnosis not present

## 2017-08-04 DIAGNOSIS — E038 Other specified hypothyroidism: Secondary | ICD-10-CM | POA: Diagnosis not present

## 2017-08-22 ENCOUNTER — Telehealth: Payer: Self-pay | Admitting: Neurology

## 2017-08-22 NOTE — Telephone Encounter (Signed)
Pt said Dr [name]/PCP advised her since her hand is still not "working" to see Dr Brett Fairy for eval of nerve problem, disc. Please see OV 7/26, does she want to see this patient or recommend who she should see?

## 2017-08-22 NOTE — Telephone Encounter (Signed)
Called the patient back.no answer.  Since this was worked up with Dr Brett Fairy we can either offer the patient a follow up apt to discuss more in detail with Dr Dohmeier since she completed that work up. Pt also has a apt with Dr Leonie Man in [place] and if she feels this could be something related to the stroke she could certainly discuss this further with him at next apt. If not stroke related pt could schedule f/u apt with Dr Brett Fairy.

## 2017-08-23 NOTE — Telephone Encounter (Signed)
Pt called back, an appt has been scheduled with Dr D for tomorrow at [facility]

## 2017-08-24 ENCOUNTER — Encounter: Payer: Self-pay | Admitting: Neurology

## 2017-08-24 ENCOUNTER — Ambulatory Visit: Payer: Medicare HMO | Admitting: Neurology

## 2017-08-24 ENCOUNTER — Encounter (INDEPENDENT_AMBULATORY_CARE_PROVIDER_SITE_OTHER): Payer: Self-pay

## 2017-08-24 VITALS — BP 100/68 | HR 69 | Ht 63.0 in | Wt 183.0 lb

## 2017-08-24 DIAGNOSIS — I63412 Cerebral infarction due to embolism of left middle cerebral artery: Secondary | ICD-10-CM | POA: Diagnosis not present

## 2017-08-24 NOTE — Progress Notes (Signed)
Provider:  Larey Seat, M D  Referring Provider: [name], MD Primary Care Physician:  [name], MD  Chief Complaint  Patient presents with  . Follow-up    h/o stroke, carpal tunnel, here for issues with hand "not working"  . Room 10    She is here alone  patient is alone   HPI: I had the pleasure of seeing [name] in August 2018 when she presented with clumsiness in her right arm and hand, grip strength loss, but also speech difficulties, facial central palsy and numbness in the lower right face.  The patient had undergone a carpal tunnel surgery with Dr. Apolonio Schneiders on the right hand, nerve conduction studies here in our office confirmed that carpal tunnel was still present actually bilaterally.  However her left hand has never been touched.  She is right-hand dominant.  Her speech and facial movements have somewhat recovered.  She was diagnosed with a stroke affecting the left speech center.  He is here today to further investigate if the arm is truly involved in the stroke or if this is a secondary postoperative problem.  The patient has undergone nerve conduction studies and EMGs with Dr. Leta Baptist. NCS (NERVE CONDUCTION STUDY) WITH EMG (ELECTROMYOGRAPHY) REPORT    STUDY DATE: 03/02/17 PATIENT NAME: [name] DOB: 1955-11-11 MRN: [id number]  ORDERING CLINICIAN: Larey Seat, MD   TECHNOLOGIST: Oneita Jolly  ELECTROMYOGRAPHER: Earlean Polka. Penumalli, MD  CLINICAL INFORMATION: 62 year female with right hand weakness. History of right carpal tunnel release surgery.  FINDINGS: NERVE CONDUCTION STUDY: Bilateral median motor responses have prolonged distal latencies (right 4.6 ms, left 4.8 ms), normal amplitudes, normal conduction velocities. Bilateral ulnar motor responses and F wave latencies are normal. Bilateral median sensory responses have prolonged peak latencies and normal amplitudes. Bilateral ulnar sensory responses are  normal.  NEEDLE ELECTROMYOGRAPHY: Needle examination of right upper extremity deltoid, biceps, triceps, flexor carpi radialis, first dorsal interosseous is normal.   IMPRESSION:  Abnormal study demonstrating: - Mild bilateral median neuropathies at the wrist consistent with mild bilateral carpal tunnel syndrome.  [facility] [address] [place], [place] [address] [phone]  NEUROIMAGING REPORT    STUDY DATE: 03/08/2017 PATIENT NAME: [name] DOB: 13-Sep-1955 MRN: [id number]  EXAM: MRI Brain with and without contrast  ORDERING CLINICIAN: Asencion Partridge Hajime Asfaw M.D. CLINICAL HISTORY: 62 year old woman with right facial droop, clumsiness and right arm weakness COMPARISON FILMS: 01/27/2017  TECHNIQUE:MRI of the brain with and without contrast was obtained utilizing 5 mm axial slices with T1, T2, T2 flair, SWI and diffusion weighted views.  T1 sagittal, T2 coronal and postcontrast views in the axial and coronal plane were obtained. CONTRAST:  [facility] IMAGING SITE: [facility], [place].  FINDINGS: On sagittal images, the spinal cord is imaged caudally to C2-C3 and is normal in caliber.   The contents of the posterior fossa are of normal size and position.   The pituitary gland has reduced height inside of a normal sized sella turcica this is also seen on previous MRIs.    Brain volume appears normal.   The ventricles are normal in size for ageand without distortion.  There are no abnormal extra-axial collections of fluid.    The cerebellum and brainstem appears normal.   The deep gray matter appears normal.  There is a patchy acute infarction is hyperintense on diffusion-weighted images in the left frontal lobe adjacent to a wedge-shaped chronic infarction (that was also seen on  the 01/27/2017 MRI but not on the 09/15/2010 MRI).   Mild chronic heme products are noted on the susceptibility weighted images. These were not  noted on the 01/27/2017 MRI.    There is one small T1 hyperintense focus that could represent a subacute focus (2-7 days).   After the infusion of contrast, there is patchy enhancement in the region of the acute stroke.    Also in the hemispheres, there are a few scattered non-enhancing T2/FLAIR hyperintense foci consistent with mild chronic microvascular ischemic change, unchanged since the previous MRI.     The orbits appear normal.   The VIIth/VIIIth nerve complex appears normal.  The mastoid air cells appear normal.  The paranasal sinuses appear normal.  Flow voids are identified within the major intracerebral arteries.     IMPRESSION:  This MRI of the brain with and without contrast shows the following: 1.   Subacute peripheral stroke left frontal lobe adjacent to a more chronic stroke that was noted on the 01/27/2017 MRI. There are some heme products within the subacute stroke. It also enhances. The stroke could be due to an embolic etiology involving the left middle cerebral. Vasculitis is also in the differential diagnosis. 2.   Elsewhere there is mild chronic microvascular ischemic change.  INTERPRETING PHYSICIAN:  Richard A. Felecia Shelling, MD, PhD, FAAN Certified in  Neuroimaging by [employer] [place] [employer]   Result Notes for MR BRAIN W WO CONTRAST   Notes recorded by Larey Seat, MD on 03/12/2017 at 10:19 AM EDT Brain results called to patient by Dr. Brett Fairy on 03-08-2017 and patient admitted due to progressive aphasia and weakness. 03-09-2017 [place].   [facility] [address], [place], [place] [address] [phone]  NEUROIMAGING REPORT    STUDY DATE: 03/08/2017 PATIENT NAME: [name] DOB: 04/03/1956 MRN: [id number]  EXAM: MRI of the cervical spine without contrast  ORDERING CLINICIAN: Asencion Partridge Orie Cuttino M.D. CLINICAL HISTORY: 62 year old woman with right arm weakness and clumsiness COMPARISON FILMS: None  TECHNIQUE: MRI  of the cervical spine was obtained utilizing 3 mm sagittal slices from the posterior fossa down to the T3-4 level with T1, T2 and inversion recovery views. In addition 4 mm axial slices from F0-9 down to T1-2 level were included with T2 and gradient echo views.   The study was ordered with and without contrast but no contrasted images are available CONTRAST: 15 mL Multi[facility] IMAGING SITE: [facility], [place], [place], [place]  FINDINGS: :  On sagittal images, the spine is imaged from above the cervicomedullary junction to T2.   The spinal cord is of normal caliber and signal.   There is minimal anterolisthesis of C7 upon T1..    The vertebral bodies have normal signal.  The discs and interspaces were further evaluated on axial views from C2 to T1 as follows: C2 - C3:  There is minimal central disc bulging that does not lead to any nerve root compression.. C3 - C4:  The disc and interspace appear normal. C4 - C5:  The disc and interspace appear normal. C5 - C6:  The disc and interspace appear normal. C6 - C7:  There is central disc protrusion that slightly distorts the thecal sac without causing spinal stenosis or cord compression. Neural foramina are widely patent and there is no nerve root compression.. C7 - T1:   On sagittal images, there appears to be minimal anterolisthesis of C7 upon T1. The disc and interspace appear normal on the axial images. There  is no nerve root compression.   IMPRESSION:  This MRI of the cervical spine without contrast shows the following: 1.   The spinal cord appears normal. 2.   There are minimal to mild degenerative changes noted at C2-C3, C6-C7 and C7-T1 that do not lead to any nerve root compression.     INTERPRETING PHYSICIAN:  Richard A. Felecia Shelling, MD, PhD, FAAN Certified in  Neuroimaging by [place] [employer]     Result Notes for MR [place]   Notes recorded by Larey Seat, MD on 03/12/2017 at  10:18 AM EDT No cervical nerve compression , no lymphatic changes at the cord or within. CD          Last visit and consult - CD 03-2017.  Ellanore Vanhook is a 62 y.o. female  Is seen here as a referral from the [facility] ED for a follow up on Bells Palsy.  The patient has followed Dr Jannifer Franklin for ocular migraine.  Mrs. Mumby is a 62 year old Caucasian female that went to the [facility] ED on 01/27/2017 and was diagnosed with Bell's palsy. She was afraid she may have had a stroke based on the facial palsy. A CAT scan was performed in the ED followed by a brain MRI that revealed a remote stroke in the past this must have been a silent stroke ,as she never had strokelike symptoms before. The patient was put on prednisone and acyclovir and took it for 1 week, before returning to hospital with upper right quadrant pain abdominal. She was diagnosed with a inflamed gallbladder. She left the hospital on 02/04/2017 with antibiotics.   ON the record today : She is treatment for carpal tunnel syndrome since last July by Dr. Caralyn Guile at [facility], had nerve conduction studies done, had injections into both wrists. Then had surgery on the right hand the most affected 1. The right hand since then has been impaired ( function). She can't write, she can't grasp or hold onto anything.  Dr. Caralyn Guile full followed with an ulnar nerve test which was normal. He started to suggest that it was a CNS related right hand dysfunction. Again this was in 2017. The patient also has a history of Castleman disease since 2012 and of coronary artery disease and myocardial infarction in 2007.   Review of Systems: Out of a complete 14 system review, the patient complains of only the following symptoms, and all other reviewed systems are negative.  Bells palsy on the right , June 27th, speech slurred. She went to ED on the 29th after 2 days of symptoms. Neurologist saw her when she returned a week later - Bells palsy.    Social History   Socioeconomic History  . Marital status: Married    Spouse name: Not on file  . Number of children: 2  . Years of education: Not on file  . Highest education level: Some college, no degree  Social Needs  . Financial resource strain: Not on file  . Food insecurity - worry: Not on file  . Food insecurity - inability: Not on file  . Transportation needs - medical: Not on file  . Transportation needs - non-medical: Not on file  Occupational History  . Not on file  Tobacco Use  . Smoking status: Current Every Day Smoker    Packs/day: 0.50    Years: 25.00    Pack years: 12.50    Types: Cigarettes  . Smokeless tobacco: Never Used  . Tobacco comment:  vapor cig sometimes  Substance and Sexual Activity  . Alcohol use: Yes    Alcohol/week: 0.6 oz    Types: 1 Glasses of wine per week    Comment: "occasssionally"  . Drug use: No  . Sexual activity: Not Currently  Other Topics Concern  . Not on file  Social History Narrative   Right handed   Lives at home with her husband   Drinks 3 cups of caffeine daily    Family History  Problem Relation Age of Onset  . Cancer Mother        lung  . Heart disease Father   . Stroke Father     Past Medical History:  Diagnosis Date  . Carpal tunnel syndrome   . Castleman's disease ([place])   . Chronic kidney disease    failure  . Heart attack ([place]) 2007  . Heart attack ([place]) 2007  . Hypertension   . Hypothyroidism   . ITP (idiopathic thrombocytopenic purpura) 06/17/2011  . Pulmonary nodule 10/10/2016  . Retroperitoneal lymphadenopathy 2012  . Stroke [facility])     Past Surgical History:  Procedure Laterality Date  . APPENDECTOMY     62 years old  . CESAREAN SECTION  1983 1989    Current Outpatient Medications  Medication Sig Dispense Refill  . ALPRAZolam (XANAX) 0.25 MG tablet Take 0.5 mg by mouth at bedtime as needed for sleep. For sleep    . clopidogrel (PLAVIX) 75 MG tablet Take 1 tablet (75 mg total) by mouth  daily. 90 tablet 1  . levothyroxine (SYNTHROID, LEVOTHROID) 88 MCG tablet Take 88 mcg by mouth daily before breakfast.    . metoprolol succinate (TOPROL-XL) 25 MG 24 hr tablet Take 12.5 mg by mouth daily.     . nicotine (NICODERM CQ - DOSED IN MG/24 HOURS) 21 mg/24hr patch Place 21 mg onto the skin daily.    . pantoprazole (PROTONIX) 20 MG tablet Take 20 mg by mouth daily.    . rosuvastatin (CRESTOR) 40 MG tablet Take 1 tablet (40 mg total) by mouth daily. 90 tablet 1   No current facility-administered medications for this visit.    Facility-Administered Medications Ordered in Other Visits  Medication Dose Route Frequency Provider Last Rate Last Dose  . sodium chloride 0.9 % injection 10 mL  10 mL Intracatheter PRN Curt Bears, MD        Allergies as of 08/24/2017 - Review Complete 08/24/2017  Allergen Reaction Noted  . Cefdinir Other (See Comments) 02/04/2011  . Levofloxacin Itching 06/17/2011  . Vicodin [hydrocodone-acetaminophen] Itching 06/17/2011    Vitals: BP 100/68 (BP Location: Right Arm, Patient Position: Sitting)   Pulse 69   Ht 5\' 3"  (1.6 m)   Wt 183 lb (83 kg)   BMI 32.42 kg/m  Last Weight:  Wt Readings from Last 1 Encounters:  08/24/17 183 lb (83 kg)   Last Height:   Ht Readings from Last 1 Encounters:  08/24/17 5\' 3"  (1.6 m)   Physical exam:  General: The patient is awake, alert and appears not in acute distress. The patient is well groomed. Head: Normocephalic, atraumatic. Neck is supple. Mallampati 4, neck circumference: 16 Cardiovascular:  Regular rate and rhythm , without  murmurs or carotid bruit, and without distended neck veins. Respiratory: Lungs are clear to auscultation. Skin:  Without evidence of edema, or rash Trunk: BMI is elevated and patient has normal posture.  Neurologic exam : The patient is awake and alert, oriented to place and time.  Memory  subjective described as intact. There is a normal attention span & concentration ability.  Speech is  Severely affected- fluent with dysarthria , not aphasia.  Mood and affect are appropriate.  Cranial nerves: Pupils are equal and briskly reactive to light.  Extraocular movements  in vertical and horizontal planes intact and without nystagmus.  Visual fields by finger perimetry are intact. Hearing to finger rub intact.  Facial sensation intact to fine touch. Facial motor strength upper face is symmetric, only  lower facial weakness on the right. Not Bells.  Tongue protrusion into either cheek is normal. Shoulder shrug is normal.   Motor exam:  The patient has a slight right shoulder droop, her right upper extremity is darker in color, it is not cold, not hot and not puffy.  She does have a weaker grip strengths pinch strengths on the right.  She has full strength of flexion and extension at the elbow.  She does not have a drop at the wrist.  Coordination: The patient demonstrated her clumsiness when she wrote a note today her handwriting is significantly changed, she is right-hand dominant and she is unable to move the pen in a legible manner.  Finger-to-nose maneuver  normal without evidence of ataxia, dysmetria or tremor.  Gait and station: Patient walks without assistive device . Deep tendon reflexes: in the  upper and lower extremities are attenuated -symmetric. Babinski maneuver response is downgoing.  Mrs. Schwarting's right lower facial droop must be of central origin, her eyebrows move symmetrically she does not have upper facial weakness, she has reported drooling, she has quite significant speech impairment and her facial droop occurred rather suddenly.  The MRI did not show an acute stroke explaining these findings but an old left frontal stroke. She had another stroke within 14 days , and this appeared to be possibly embolic, ECHO cardiogram was normal. NCV and EMG digestive of bilateral mild carpal tunnel syndrome but there was no ulnar involvement, I did not see any reference  to the radial nerve.  I also obtained a cervical spine MRI and this showed nothing suggestive of impingement for the brachial nerve system.   I strongly believe that this is a stroke related right hand clumsiness. I will asked the patient to keep her appointment with Dr.Sethi our stroke MD Feb. 28th.  Assessment:  After physical and neurologic examination, review of laboratory studies, imaging, neurophysiology testing and pre-existing records, assessment is that of :   Central facial palsy. Speech impairment  And right arm grip strength loss after carpal tunnel release, follow up NCV only found mild carpal tunnel.  To explain her complex findings the most logical conclusion is left brain frontal CVA- STROKE related changes. It is peculiar that she has only hand clumsiness and not any weakness at the elbow.    See Dr Leonie Man on 09-28-2017.  No further testing     Larey Seat MD 08/24/2017

## 2017-09-28 ENCOUNTER — Encounter: Payer: Self-pay | Admitting: Neurology

## 2017-09-28 ENCOUNTER — Ambulatory Visit: Payer: Medicare HMO | Admitting: Neurology

## 2017-09-28 VITALS — BP 128/55 | HR 88 | Wt 186.2 lb

## 2017-09-28 DIAGNOSIS — I10 Essential (primary) hypertension: Secondary | ICD-10-CM

## 2017-09-28 DIAGNOSIS — G5601 Carpal tunnel syndrome, right upper limb: Secondary | ICD-10-CM | POA: Diagnosis not present

## 2017-09-28 DIAGNOSIS — R29810 Facial weakness: Secondary | ICD-10-CM

## 2017-09-28 DIAGNOSIS — G510 Bell's palsy: Secondary | ICD-10-CM | POA: Diagnosis not present

## 2017-09-28 DIAGNOSIS — E785 Hyperlipidemia, unspecified: Secondary | ICD-10-CM | POA: Diagnosis not present

## 2017-09-28 DIAGNOSIS — I63412 Cerebral infarction due to embolism of left middle cerebral artery: Secondary | ICD-10-CM

## 2017-09-28 NOTE — Patient Instructions (Signed)
I had a long d/w patient about his recent stroke, risk for recurrent stroke/TIAs, personally independently reviewed imaging studies and stroke evaluation results and answered questions.Continue clopidogrel 75 mg daily  for secondary stroke prevention and maintain strict control of hypertension with blood pressure goal below 130/90, diabetes with hemoglobin A1c goal below 6.5% and lipids with LDL cholesterol goal below 70 mg/dL. I also advised the patient to eat a healthy diet with plenty of whole grains, cereals, fruits and vegetables, exercise regularly and maintain ideal body weight Followup in the future with Janett Billow, NP in 1 year  -important to quit smoking to reduce risk of further stroke  -follow up with your primary care doctor to help manage cholesterol and blood pressure  -follow up with [place] in 1 year or call earlier if needed

## 2017-09-28 NOTE — Progress Notes (Signed)
[facility] [address] [place]. [place] [address] [phone]       OFFICE FOLLOW-UP NOTE  [name] Date of Birth:  1956-01-06 Medical Record Number:  [id number]   HPI: [name] is a 62 year old Caucasian lady with past medical history significant for hypertension, hyperlipidemia, ITP, Castleman's disease   who is seen today for first office follow-up visit following hospital admission for stroke. History is obtained from the patient, review of electronic medical records and have personally reviewed imaging films.[name] an 62 y.o.femalewas seen back in 02/03/2017 at that time with a right facial droop and weakness 2 days and MRI which was negative. She was diagnosed with Bell's palsy and sent home with prednisone. Per patient since that period of time up to now her symptoms of right facial droop, right face numbness and difficulty swallowing and speaking have increased in addition she now has some right arm numbness. MRI was obtained as an outpatient which showed a subacute peripheral stroke along the periphery of chronic infarct the left frontal lobe. For this reason patient was told to come to the emergency room and have a stroke workup. Patient admits to tobacco abuse and states that she does take aspirin however when her stomach is causing her problems she will not take aspirin at that time. Date last known well: Unable to determine.Time last known well: Unable to determine.tPA Given:No: No exact last known normal. Patient informs me that she's actually had right upper extremity weakness and numbness since November 2017. She was evaluated by Dr. Apolonio Schneiders from orthopedics and Dr. of carpal tunnel. She had 2 separate injections of steroids into her lateral right wrist with suboptimal improvement in her weakness and numbness. She did not have any brain imaging at that time. She was seen in the hospital on June 29 by Dr. Addison Lank and thought to have a  dominant facial weakness related to Bell's palsy and started on prednisone and acyclovir. MRI scan of the brain obtained and was negative for acute infarct. rate. She saw Dr. Brett Fairy in office who felt patient may have had a stroke and hence I obtained an outpatient MRI which confirmed a stroke prompting the above admission. She had transthoracic echo which showed normal ejection fraction without obvious cardiac source of embolism.Hemoglobin A1c was 6.1. LDL cholesterol was elevated at 143 mg percent. Carotid ultrasound showed no significant extracranial stenosis. MRA of the brain showed multiple areas of irregularity in the anterior and posterior division of the M2 segment of the left middle cerebral artery with occluded left M2 segment. There was also moderate atheromatous narrowing of the left cavernous carotid artery which had progressed compared with previous MRA from 2012. The left vertebral artery was also occluded. Update 09/28/17: [name] is here today for a six-month stroke follow-up.  Patient has complaints of right hand digits 1 through 3 feeling like "dead fingers".  Patient denies numbness, tingling or pain.  Patient states that she did have a carpal tunnel release surgery in April 2018 and her fingers have me look feeling like this since.  She did come to this office to see Dr. Brett Fairy regarding this complaint, and she was told that it was a residual side effect from her stroke.  Patient felt as if there should be something that can be done as far as getting feeling back into her fingers.  Patient states that she will not participate in any type of patient also states that she therapy as she is too old  and not going to pay "$80 per week".  Is having right sciatic gum numbness over her right lateral incisor as though it feels like Novocain.  Denies pain or swallowing difficulties.  Patient is continues to take her Plavix without side effects such as increased bleeding or bruising.  Patient  continues to take her Crestor without side effects of myalgias.  Patient's blood pressure has been satisfactory and at today's visit 128/55.  Patient is continuing to smoke approximately 10 cigarettes/day.  Patient states that she was attempting to use the nicotine patch 21 mg but complained of night terrors.  She stated that she would take the patch off at night but would wake up wanting a cigarette.  Patient states that she is going to try to decrease patch to 14 mg and see if she is able to keep the patch on around the clock.  Patient denies new or worsening stroke/TIA symptoms.  ROS:   14 system review of systems is positive for environmental allergies and depression middle-aged Caucasian female and all other systems negative  PMH:  Past Medical History:  Diagnosis Date  . Carpal tunnel syndrome   . Castleman's disease ([place])   . Chronic kidney disease    failure  . Heart attack ([place]) 2007  . Heart attack ([place]) 2007  . Hypertension   . Hypothyroidism   . ITP (idiopathic thrombocytopenic purpura) 06/17/2011  . Pulmonary nodule 10/10/2016  . Retroperitoneal lymphadenopathy 2012  . Stroke [facility])     Social History:  Social History   Socioeconomic History  . Marital status: Married    Spouse name: Not on file  . Number of children: 2  . Years of education: Not on file  . Highest education level: Some college, no degree  Social Needs  . Financial resource strain: Not on file  . Food insecurity - worry: Not on file  . Food insecurity - inability: Not on file  . Transportation needs - medical: Not on file  . Transportation needs - non-medical: Not on file  Occupational History  . Not on file  Tobacco Use  . Smoking status: Current Every Day Smoker    Packs/day: 0.50    Years: 25.00    Pack years: 12.50    Types: Cigarettes  . Smokeless tobacco: Never Used  . Tobacco comment: vapor cig sometimes  Substance and Sexual Activity  . Alcohol use: Yes    Alcohol/week: 0.6 oz     Types: 1 Glasses of wine per week    Comment: "occasssionally"  . Drug use: No  . Sexual activity: Not Currently  Other Topics Concern  . Not on file  Social History Narrative   Right handed   Lives at home with her husband   Drinks 3 cups of caffeine daily    Medications:   Current Outpatient Medications on File Prior to Visit  Medication Sig Dispense Refill  . ALPRAZolam (XANAX) 0.25 MG tablet Take 0.5 mg by mouth at bedtime as needed for sleep. For sleep    . clopidogrel (PLAVIX) 75 MG tablet Take 1 tablet (75 mg total) by mouth daily. 90 tablet 1  . levothyroxine (SYNTHROID, LEVOTHROID) 88 MCG tablet Take 88 mcg by mouth daily before breakfast.    . metoprolol succinate (TOPROL-XL) 25 MG 24 hr tablet Take 12.5 mg by mouth daily.     . nicotine (NICODERM CQ - DOSED IN MG/24 HOURS) 21 mg/24hr patch Place 14 mg onto the skin daily.     Marland Kitchen  pantoprazole (PROTONIX) 20 MG tablet Take 20 mg by mouth daily.    . rosuvastatin (CRESTOR) 40 MG tablet Take 1 tablet (40 mg total) by mouth daily. 90 tablet 1   Current Facility-Administered Medications on File Prior to Visit  Medication Dose Route Frequency Provider Last Rate Last Dose  . sodium chloride 0.9 % injection 10 mL  10 mL Intracatheter PRN Curt Bears, MD        Allergies:   Allergies  Allergen Reactions  . Cefdinir Other (See Comments)    Causes vision problems  . Levofloxacin Itching  . Vicodin [Hydrocodone-Acetaminophen] Itching    Physical Exam General: well developed, middle-aged Caucasian female, well nourished, seated, in no evident distress Head: head normocephalic and atraumatic.  Neck: supple with no carotid or supraclavicular bruits Cardiovascular: regular rate and rhythm, no murmurs Musculoskeletal: no deformity Skin:  no rash/petichiae Vascular:  Normal pulses all extremities Vitals:   09/28/17 1034  BP: (!) 128/55  Pulse: 88   Neurologic Exam Mental Status: Awake and fully alert. Oriented to place  and time. Recent and remote memory intact. Attention span, concentration and fund of knowledge appropriate. Mood and affect appropriate. Mild dysarthria present. Cranial Nerves: Fundoscopic exam reveals sharp disc margins. Pupils equal, briskly reactive to light. Extraocular movements full without nystagmus. Visual fields full to confrontation. Hearing intact. Facial sensation intact.  Mild right facial asymmetry noted. Motor: Normal bulk and tone. Normal strength in all tested extremity muscles.  Normal and symmetrical strength tested in all extremities.  Mild orbiting left around right. Sensory.:  Decreased pinprick sensation in right digits 1 through 3. Coordination: Rapid alternating movements normal in all extremities. Finger-to-nose and heel-to-shin performed accurately bilaterally. Gait and Station: Arises from chair without difficulty. Stance is normal. Gait demonstrates normal stride length and balance . Able to heel, toe and tandem walk without difficulty.  Reflexes: 1+ and symmetric. Toes downgoing.   NIHSS 2 Modified [place]  2   ASSESSMENT: 43 year Caucasian lady  with recurrent left MCA branch infarcts likely due to intracranial atherosclerosis. Multiple vascular risk factors of smoking, hyperlipidemia and hypertension. History of Castleman's disease and ITP likely unrelated to her stroke At todays visit, pt c/o lack of sensation on right right digits 1-3. Hx of carpel tunnel where she did have carpal tunnel release surgery in 10/2016. She was also concerned with feeling of increased right sided gum numbness over right lateral incisor.   PLAN: I had a long d/w patient about his recent stroke, risk for recurrent stroke/TIAs, personally independently reviewed imaging studies and stroke evaluation results and answered questions.Continue clopidogrel 75 mg daily  for secondary stroke prevention and maintain strict control of hypertension with blood pressure goal below 130/90, diabetes with  hemoglobin A1c goal below 6.5% and lipids with LDL cholesterol goal below 70 mg/dL. I also advised the patient to eat a healthy diet with plenty of whole grains, cereals, fruits and vegetables, exercise regularly and maintain ideal body weight Followup in the future with Janett Billow, NP in 1 year  -Smoking cessation counselling done  -PCP to manage HLD and HTN  Greater than 50% of time during this 25 minute visit was spent on counseling,explanation of diagnosis of intracranial atherosclerosis, stroke post stroke paresthesias, planning of further management, discussion with patient and family and coordination of care Antony Contras, MD  [facility] [address] [place], [place] [address]  Phone [phone] Fax [phone] Note: This document was prepared with digital dictation and possible smart phrase  technology. Any transcriptional errors that result from this process are unintentional

## 2017-10-04 DIAGNOSIS — I252 Old myocardial infarction: Secondary | ICD-10-CM | POA: Diagnosis not present

## 2017-10-04 DIAGNOSIS — E785 Hyperlipidemia, unspecified: Secondary | ICD-10-CM | POA: Diagnosis not present

## 2017-10-04 DIAGNOSIS — I10 Essential (primary) hypertension: Secondary | ICD-10-CM | POA: Diagnosis not present

## 2017-10-04 DIAGNOSIS — R69 Illness, unspecified: Secondary | ICD-10-CM | POA: Diagnosis not present

## 2017-10-04 DIAGNOSIS — I2510 Atherosclerotic heart disease of native coronary artery without angina pectoris: Secondary | ICD-10-CM | POA: Diagnosis not present

## 2017-10-04 DIAGNOSIS — E039 Hypothyroidism, unspecified: Secondary | ICD-10-CM | POA: Diagnosis not present

## 2017-10-04 DIAGNOSIS — I639 Cerebral infarction, unspecified: Secondary | ICD-10-CM | POA: Diagnosis not present

## 2017-10-09 ENCOUNTER — Ambulatory Visit (HOSPITAL_COMMUNITY)
Admission: RE | Admit: 2017-10-09 | Discharge: 2017-10-09 | Disposition: A | Discharge status: Home / Self Care | Payer: Medicare HMO | Source: Ambulatory Visit | Attending: Internal Medicine | Admitting: Internal Medicine

## 2017-10-09 ENCOUNTER — Inpatient Hospital Stay: Payer: Medicare HMO | Attending: Internal Medicine

## 2017-10-09 ENCOUNTER — Encounter (HOSPITAL_COMMUNITY): Payer: Self-pay

## 2017-10-09 DIAGNOSIS — Z8673 Personal history of transient ischemic attack (TIA), and cerebral infarction without residual deficits: Secondary | ICD-10-CM | POA: Diagnosis not present

## 2017-10-09 DIAGNOSIS — R160 Hepatomegaly, not elsewhere classified: Secondary | ICD-10-CM | POA: Diagnosis not present

## 2017-10-09 DIAGNOSIS — K8020 Calculus of gallbladder without cholecystitis without obstruction: Secondary | ICD-10-CM | POA: Insufficient documentation

## 2017-10-09 DIAGNOSIS — J432 Centrilobular emphysema: Secondary | ICD-10-CM | POA: Diagnosis not present

## 2017-10-09 DIAGNOSIS — Z881 Allergy status to other antibiotic agents status: Secondary | ICD-10-CM | POA: Insufficient documentation

## 2017-10-09 DIAGNOSIS — I252 Old myocardial infarction: Secondary | ICD-10-CM | POA: Insufficient documentation

## 2017-10-09 DIAGNOSIS — G5600 Carpal tunnel syndrome, unspecified upper limb: Secondary | ICD-10-CM | POA: Insufficient documentation

## 2017-10-09 DIAGNOSIS — J438 Other emphysema: Secondary | ICD-10-CM | POA: Diagnosis not present

## 2017-10-09 DIAGNOSIS — J439 Emphysema, unspecified: Secondary | ICD-10-CM | POA: Diagnosis not present

## 2017-10-09 DIAGNOSIS — G510 Bell's palsy: Secondary | ICD-10-CM | POA: Insufficient documentation

## 2017-10-09 DIAGNOSIS — I2510 Atherosclerotic heart disease of native coronary artery without angina pectoris: Secondary | ICD-10-CM | POA: Diagnosis not present

## 2017-10-09 DIAGNOSIS — I129 Hypertensive chronic kidney disease with stage 1 through stage 4 chronic kidney disease, or unspecified chronic kidney disease: Secondary | ICD-10-CM | POA: Diagnosis not present

## 2017-10-09 DIAGNOSIS — Z885 Allergy status to narcotic agent status: Secondary | ICD-10-CM | POA: Insufficient documentation

## 2017-10-09 DIAGNOSIS — R599 Enlarged lymph nodes, unspecified: Secondary | ICD-10-CM

## 2017-10-09 DIAGNOSIS — E039 Hypothyroidism, unspecified: Secondary | ICD-10-CM | POA: Diagnosis not present

## 2017-10-09 DIAGNOSIS — D47Z2 Castleman disease: Secondary | ICD-10-CM

## 2017-10-09 DIAGNOSIS — Z7902 Long term (current) use of antithrombotics/antiplatelets: Secondary | ICD-10-CM | POA: Diagnosis not present

## 2017-10-09 DIAGNOSIS — I700 Atherosclerosis of aorta: Secondary | ICD-10-CM | POA: Diagnosis not present

## 2017-10-09 DIAGNOSIS — D693 Immune thrombocytopenic purpura: Secondary | ICD-10-CM | POA: Insufficient documentation

## 2017-10-09 DIAGNOSIS — Z79890 Hormone replacement therapy (postmenopausal): Secondary | ICD-10-CM | POA: Insufficient documentation

## 2017-10-09 DIAGNOSIS — N189 Chronic kidney disease, unspecified: Secondary | ICD-10-CM | POA: Diagnosis not present

## 2017-10-09 DIAGNOSIS — R590 Localized enlarged lymph nodes: Secondary | ICD-10-CM | POA: Diagnosis not present

## 2017-10-09 DIAGNOSIS — Z79899 Other long term (current) drug therapy: Secondary | ICD-10-CM | POA: Insufficient documentation

## 2017-10-09 LAB — COMPREHENSIVE METABOLIC PANEL
ALT: 10 U/L (ref 0–55)
ANION GAP: 6 (ref 3–11)
AST: 15 U/L (ref 5–34)
Albumin: 4 g/dL (ref 3.5–5.0)
Alkaline Phosphatase: 101 U/L (ref 40–150)
BILIRUBIN TOTAL: 0.4 mg/dL (ref 0.2–1.2)
BUN: 16 mg/dL (ref 7–26)
CO2: 30 mmol/L — ABNORMAL HIGH (ref 22–29)
CREATININE: 0.86 mg/dL (ref 0.60–1.10)
Calcium: 9.8 mg/dL (ref 8.4–10.4)
Chloride: 104 mmol/L (ref 98–109)
Glucose, Bld: 94 mg/dL (ref 70–140)
Potassium: 4.7 mmol/L (ref 3.5–5.1)
SODIUM: 140 mmol/L (ref 136–145)
TOTAL PROTEIN: 7 g/dL (ref 6.4–8.3)

## 2017-10-09 LAB — CBC WITH DIFFERENTIAL/PLATELET
BASOS ABS: 0 10*3/uL (ref 0.0–0.1)
BASOS PCT: 1 %
EOS ABS: 0.2 10*3/uL (ref 0.0–0.5)
Eosinophils Relative: 3 %
HEMATOCRIT: 42.2 % (ref 34.8–46.6)
HEMOGLOBIN: 14 g/dL (ref 11.6–15.9)
Lymphocytes Relative: 33 %
Lymphs Abs: 2 10*3/uL (ref 0.9–3.3)
MCH: 30.5 pg (ref 25.1–34.0)
MCHC: 33.1 g/dL (ref 31.5–36.0)
MCV: 92.1 fL (ref 79.5–101.0)
Monocytes Absolute: 0.6 10*3/uL (ref 0.1–0.9)
Monocytes Relative: 10 %
NEUTROS ABS: 3.1 10*3/uL (ref 1.5–6.5)
NEUTROS PCT: 53 %
Platelets: 234 10*3/uL (ref 145–400)
RBC: 4.58 MIL/uL (ref 3.70–5.45)
RDW: 13.1 % (ref 11.2–14.5)
WBC: 6 10*3/uL (ref 3.9–10.3)

## 2017-10-09 LAB — LACTATE DEHYDROGENASE: LDH: 145 U/L (ref 125–245)

## 2017-10-09 MED ORDER — IOPAMIDOL (ISOVUE-300) INJECTION 61%
100.0000 mL | Freq: Once | INTRAVENOUS | Status: AC | PRN
  Administered 2017-10-09: 100 mL via INTRAVENOUS

## 2017-10-09 MED ORDER — IOPAMIDOL (ISOVUE-300) INJECTION 61%
INTRAVENOUS | Status: AC
  Administered 2017-10-09: 100 mL via INTRAVENOUS
  Filled 2017-10-09: qty 100

## 2017-10-09 MED ORDER — SODIUM CHLORIDE 0.9 % IJ SOLN
INTRAMUSCULAR | Status: AC
  Filled 2017-10-09: qty 50

## 2017-10-10 IMAGING — CR DG CHEST 2V
2 series · 2 of 2 positions shown · non-contrast
Comparison: 02/03/2017

CLINICAL DATA: Recent stroke history

EXAM:
CHEST  2 VIEW

[chest pa]
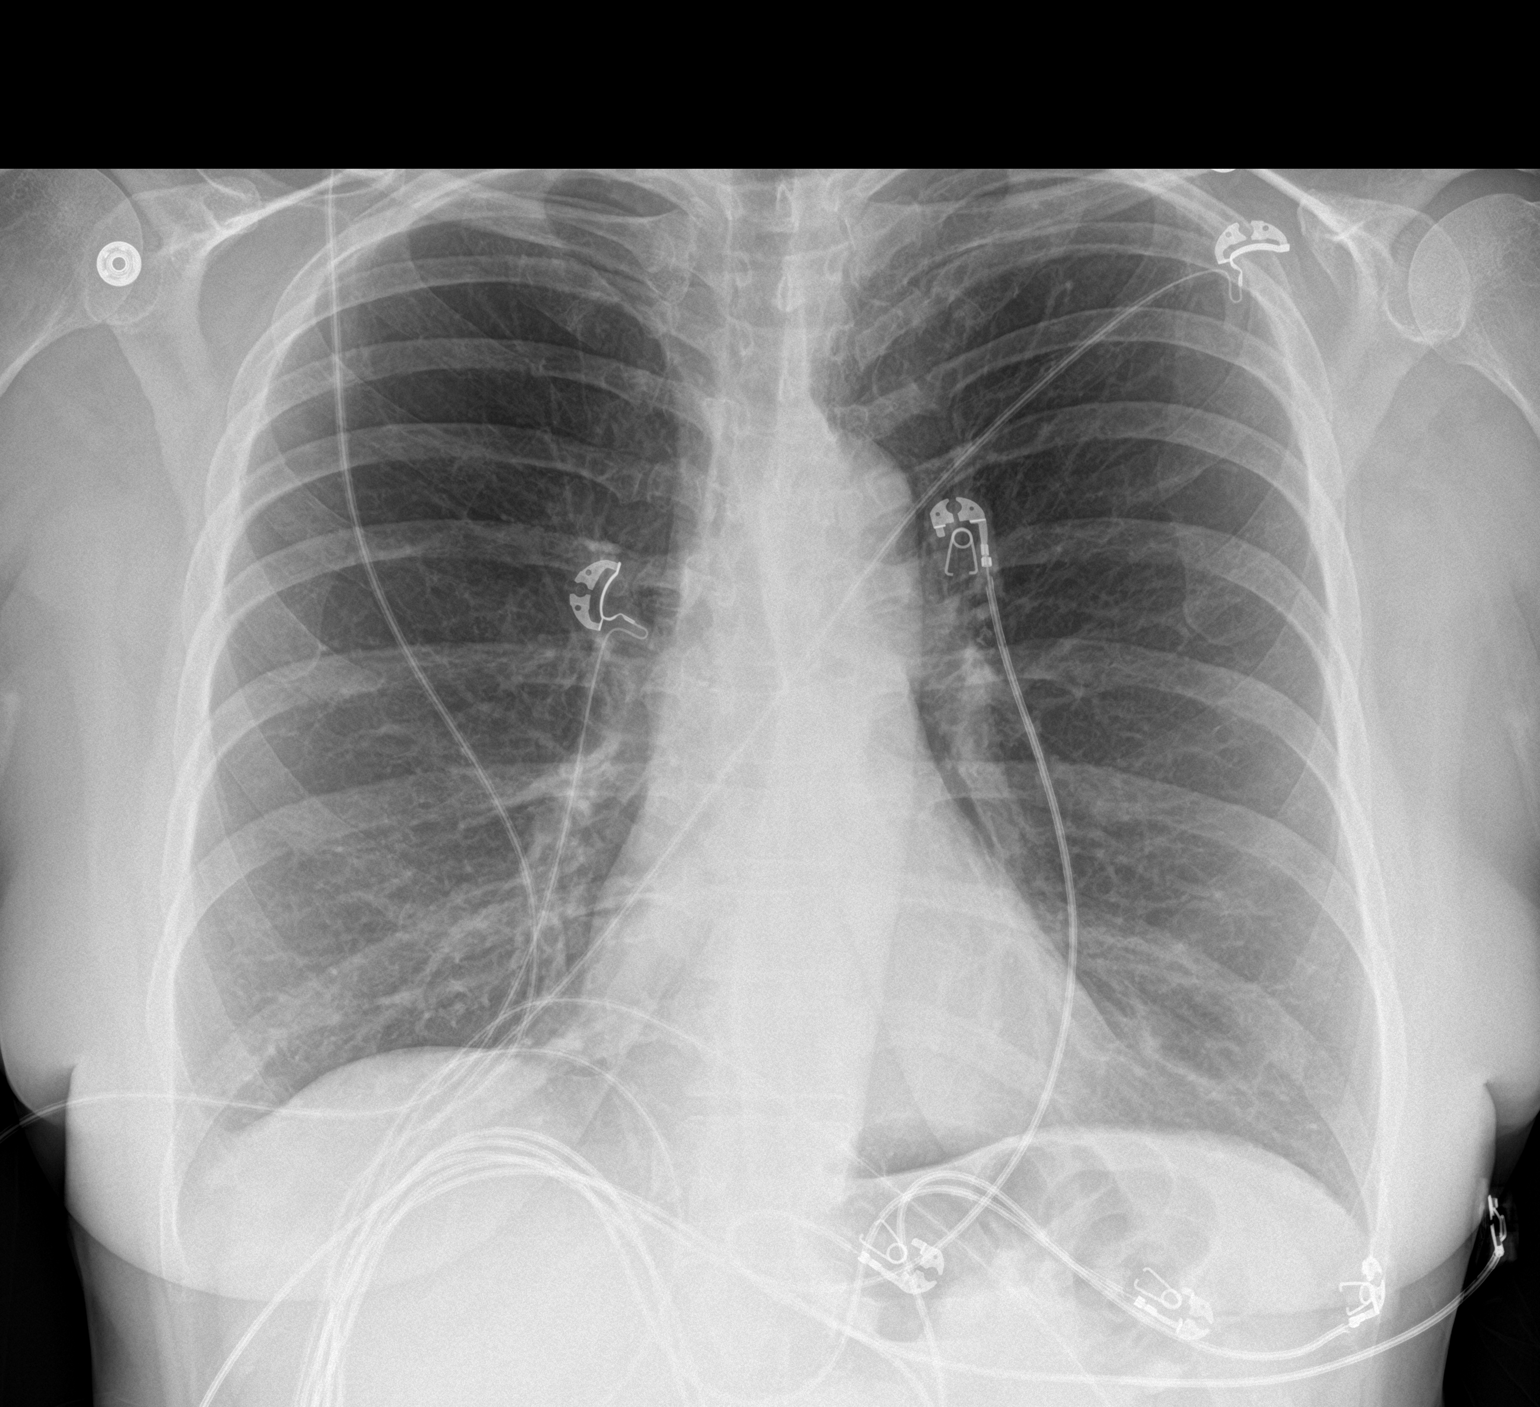

[chest lat]
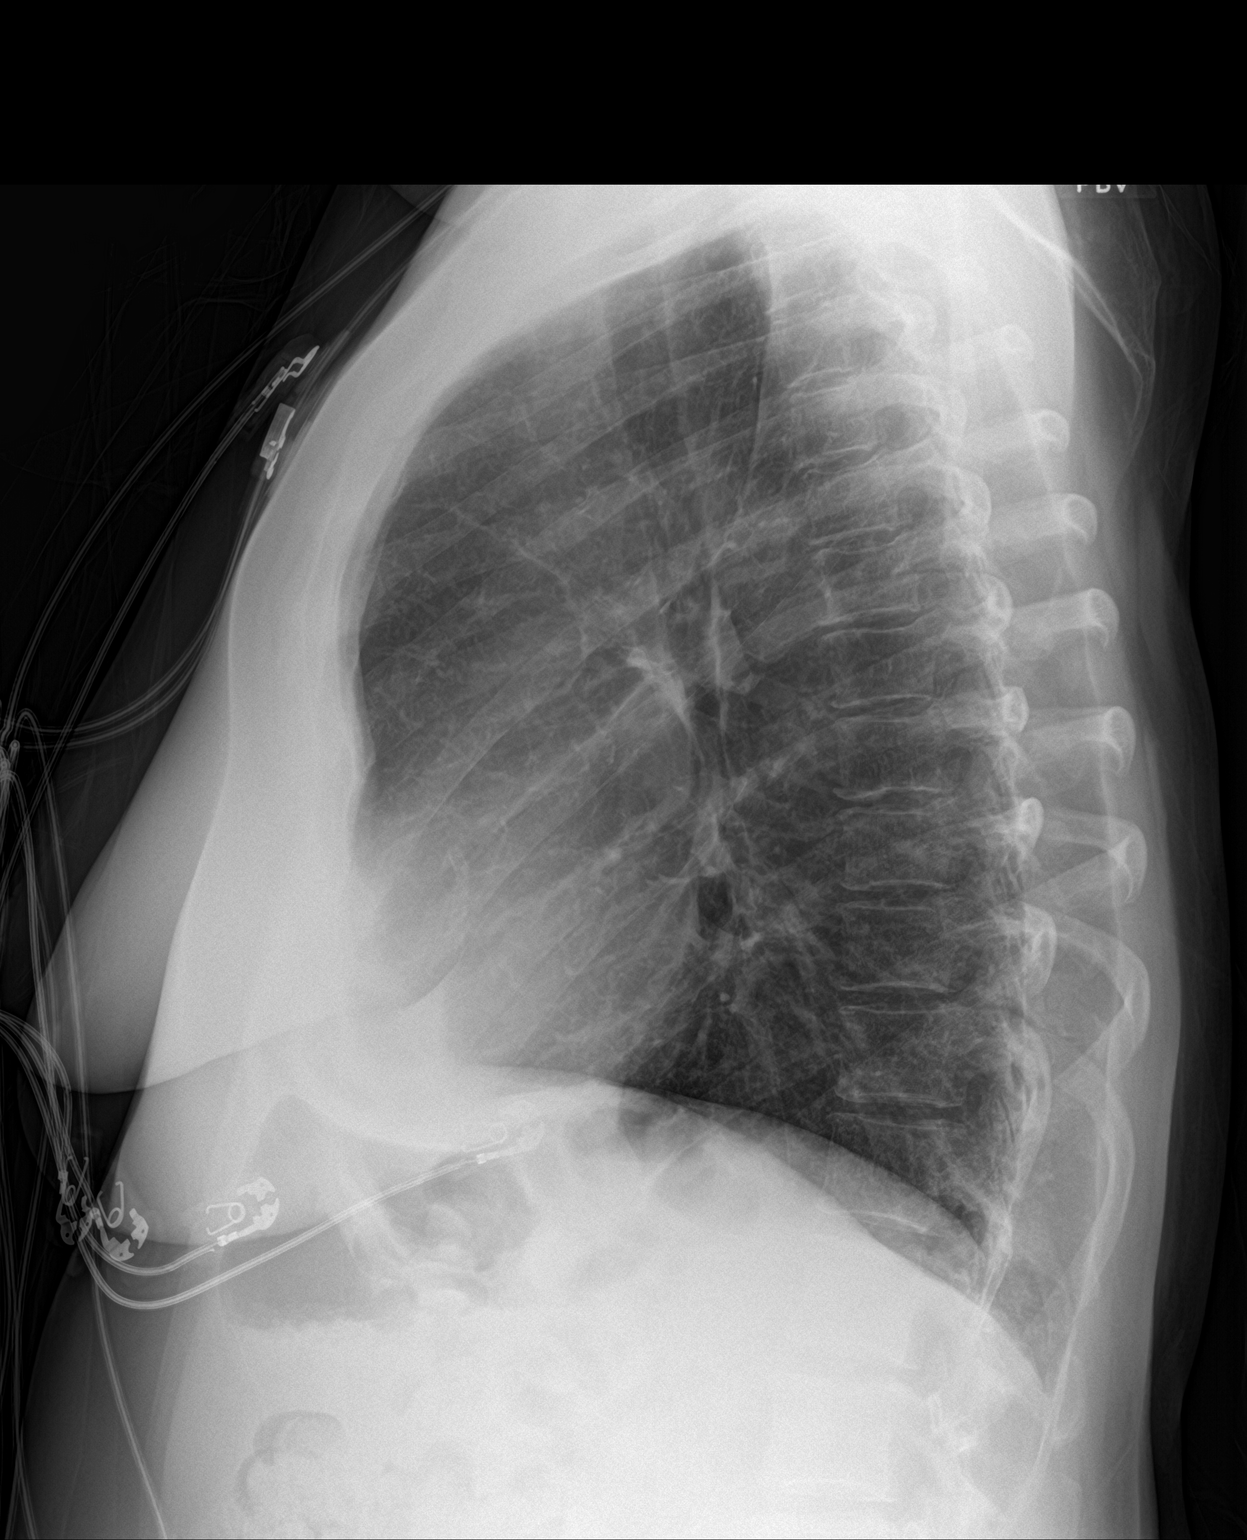

[2 of 2 positions shown; findings below may reference images not displayed]

FINDINGS: Cardiac shadow is within normal limits. The lungs are well aerated
bilaterally. No focal infiltrate or sizable effusion is seen. No
acute bony abnormality is noted.
IMPRESSION: No active cardiopulmonary disease.

## 2017-10-10 IMAGING — MR MR MRA HEAD W/O CM
1 series · 19 of 48 positions shown · non-contrast
Comparison: MRI head 03/08/2017, 01/27/2017.  MRA head 10/20/2010

CLINICAL DATA: Facial droop, stroke

EXAM:
MRA HEAD WITHOUT CONTRAST
TECHNIQUE: Angiographic images of the Circle of Willis were obtained using MRA
technique without intravenous contrast.

[Series 3: (id) mt fs · axial · 1.4mm · 0.43mm/px · z∈[-51,+39]mm · 19 of 136 slices shown]
[im 1/136]
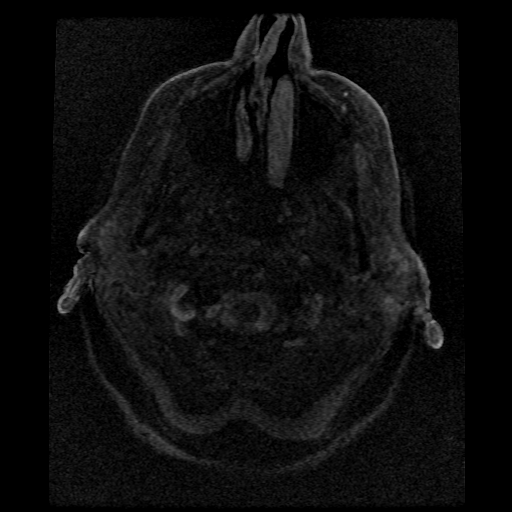
[im 3/136]
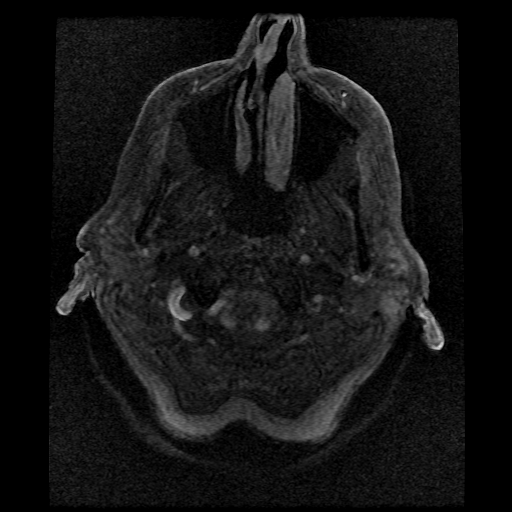
[im 6/136]
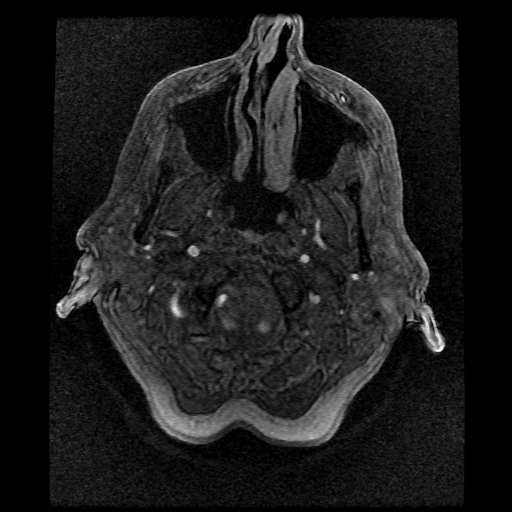
[im 9/136]
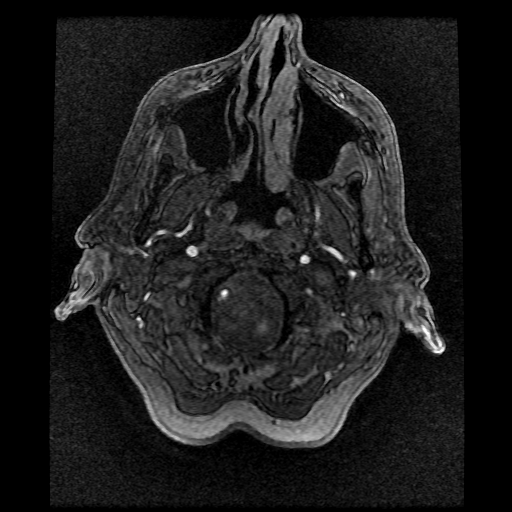
[im 12/136]
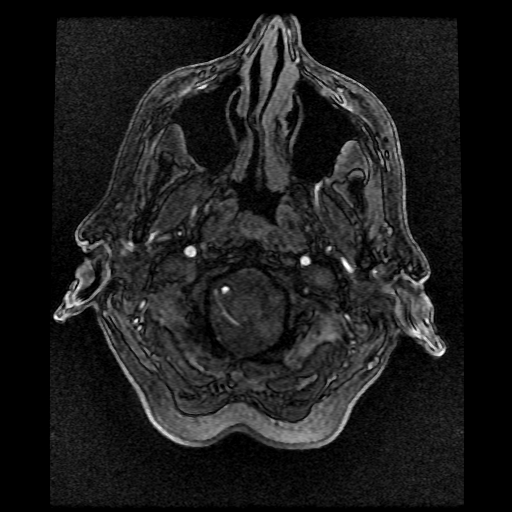
[im 15/136]
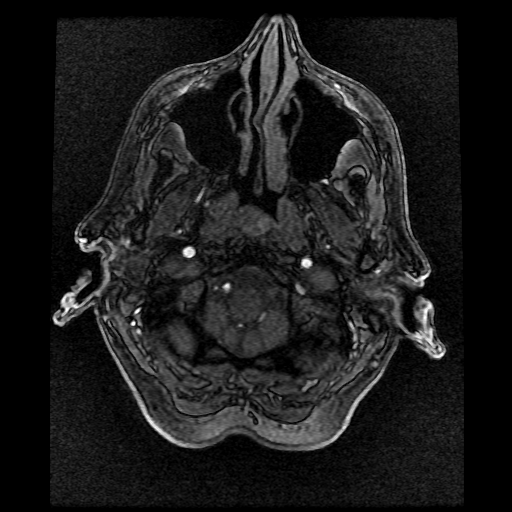
[im 18/136]
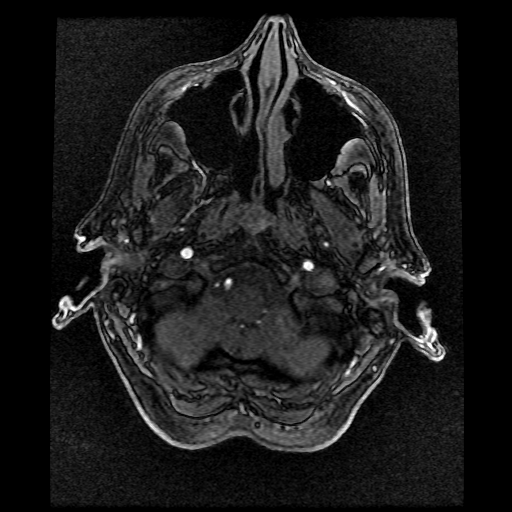
[im 21/136]
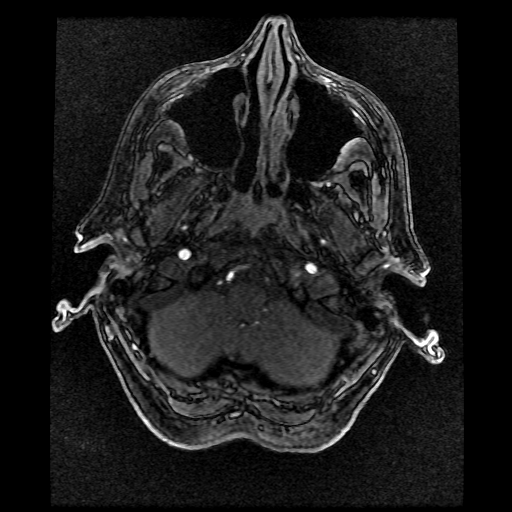
[im 23/136]
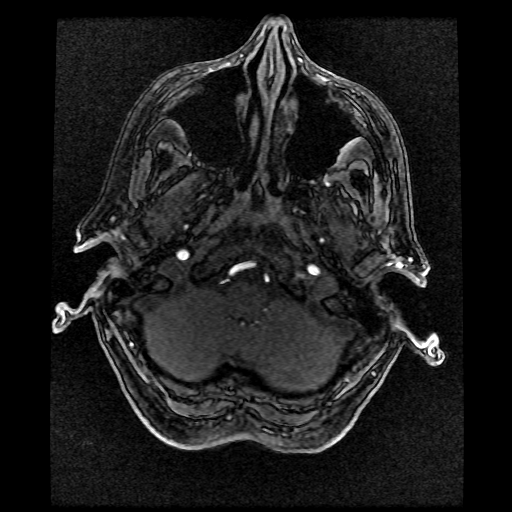
[im 26/136]
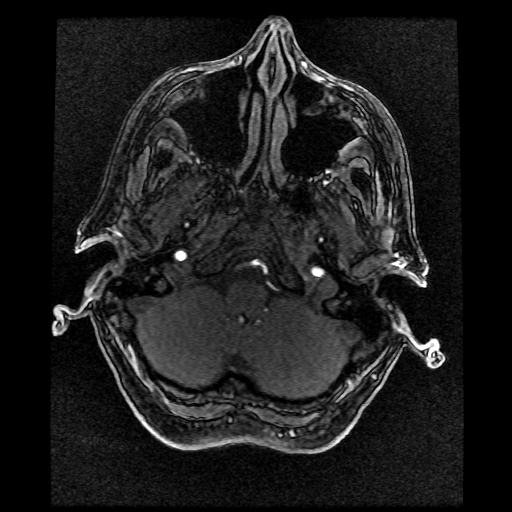
[im 29/136]
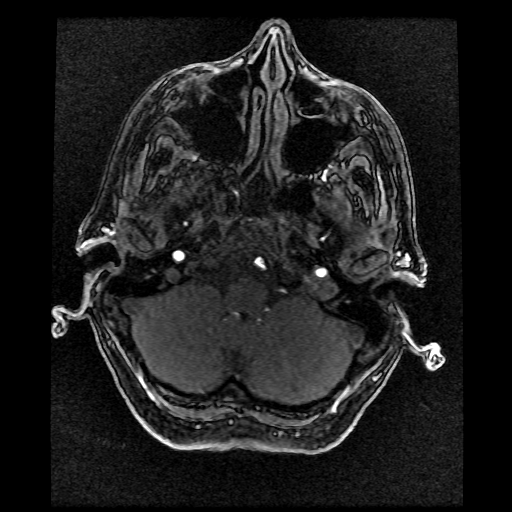
[im 44/136]
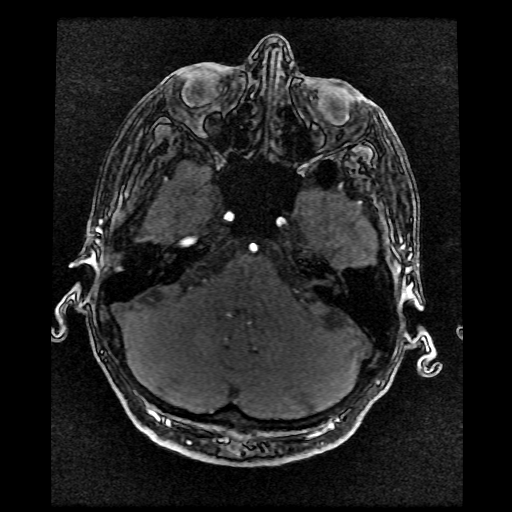
[im 61/136]
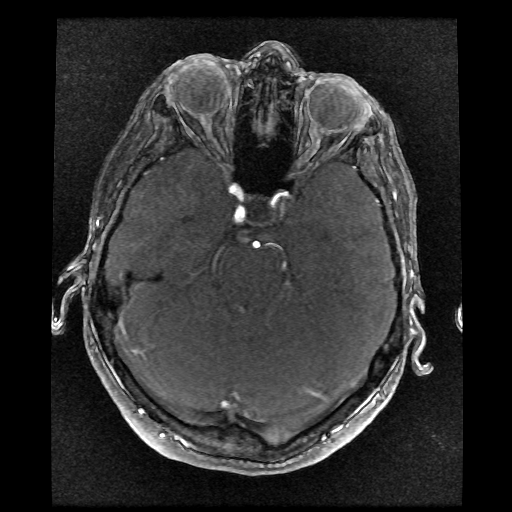
[im 69/136]
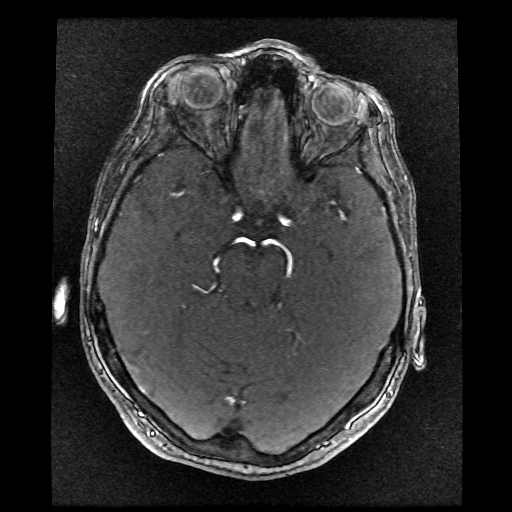
[im 78/136]
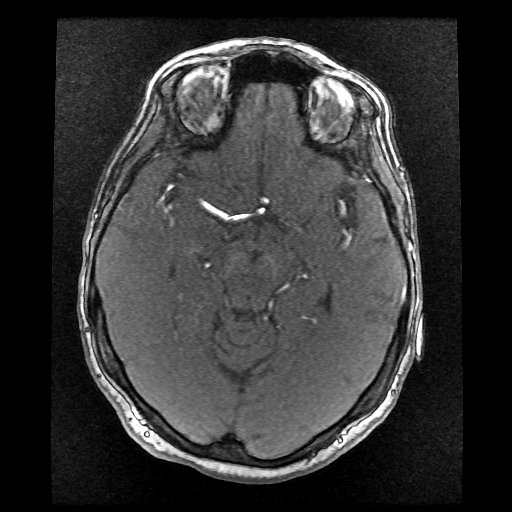
[im 95/136]
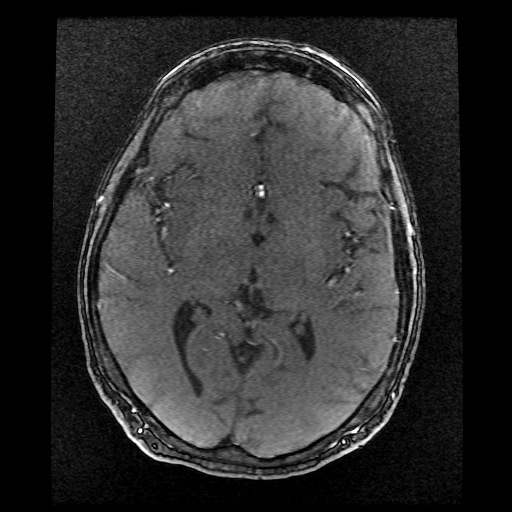
[im 113/136]
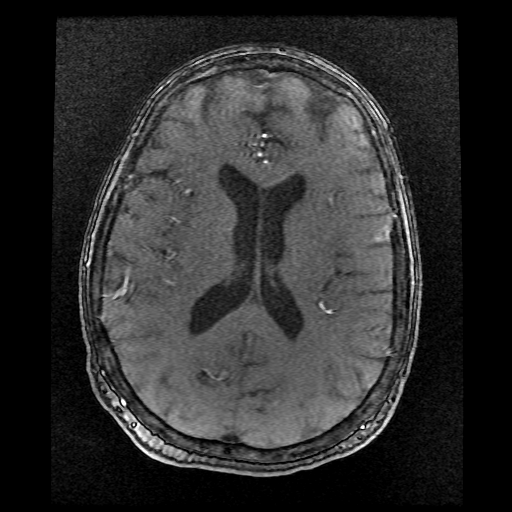
[im 115/136]
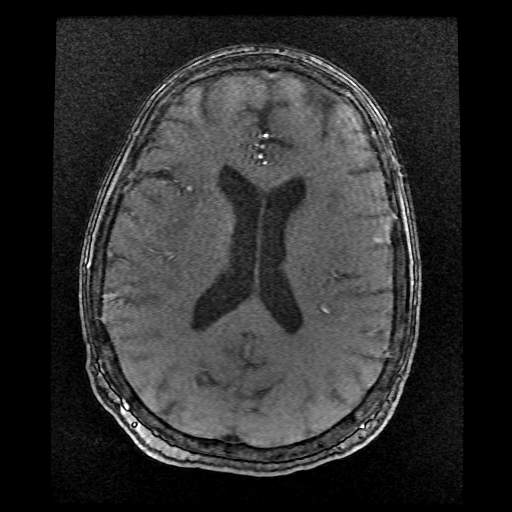
[im 130/136]
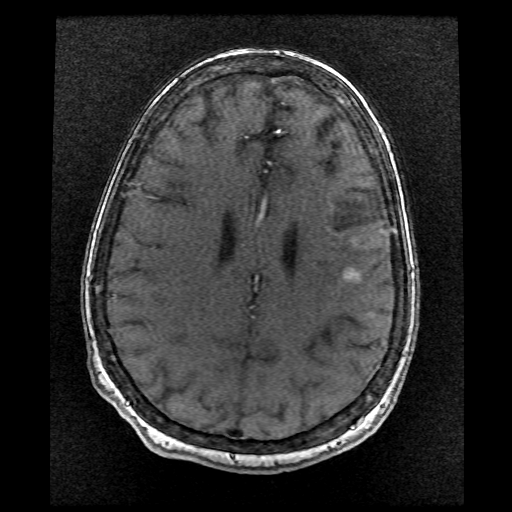

[19 of 48 positions shown; findings below may reference images not displayed]

FINDINGS: Right vertebral artery and right PICA patent. Left distal vertebral
artery is occluded. Left PICA is supplied from retrograde flow in
the distal left vertebral artery. Basilar widely patent. Superior
cerebellar and posterior cerebral arteries patent. Distal left
vertebral artery widely patent in 2902.

Atherosclerotic irregularity and moderate stenosis left cavernous
carotid with progression since 2902. Left M1 segment patent.
Multiple areas of irregularity in anterior and posterior M2
divisions on the left. Occluded left M2 segment supplying the
operculum and the acute infarct. Progression of disease in the
frontal branch of the left anterior division. These vessels appeared
normal in 2902.

Both anterior cerebral arteries widely patent. Right middle cerebral
artery patent. Mild irregularity in right middle cerebral artery
branches due to atherosclerotic disease.

Negative for aneurysm
IMPRESSION: Occlusion of the left vertebral artery.

Moderate stenosis left cavernous carotid. Diffuse atherosclerotic
disease in the middle cerebral artery M2 branches bilaterally.
Occluded branch to the operculum on the left as well as severe
disease in a frontal branch of the left middle cerebral artery.
These vessels appeared normal on MRA 2902.

## 2017-10-11 ENCOUNTER — Telehealth: Payer: Self-pay

## 2017-10-11 ENCOUNTER — Encounter: Payer: Self-pay | Admitting: Internal Medicine

## 2017-10-11 ENCOUNTER — Inpatient Hospital Stay (HOSPITAL_BASED_OUTPATIENT_CLINIC_OR_DEPARTMENT_OTHER): Payer: Medicare HMO | Admitting: Internal Medicine

## 2017-10-11 VITALS — BP 155/95 | HR 94 | Temp 97.7°F | Resp 20 | Ht 63.0 in | Wt 187.1 lb

## 2017-10-11 DIAGNOSIS — D47Z2 Castleman disease: Secondary | ICD-10-CM | POA: Diagnosis not present

## 2017-10-11 DIAGNOSIS — G5600 Carpal tunnel syndrome, unspecified upper limb: Secondary | ICD-10-CM | POA: Diagnosis not present

## 2017-10-11 DIAGNOSIS — Z8673 Personal history of transient ischemic attack (TIA), and cerebral infarction without residual deficits: Secondary | ICD-10-CM | POA: Diagnosis not present

## 2017-10-11 DIAGNOSIS — G510 Bell's palsy: Secondary | ICD-10-CM | POA: Diagnosis not present

## 2017-10-11 DIAGNOSIS — D693 Immune thrombocytopenic purpura: Secondary | ICD-10-CM | POA: Diagnosis not present

## 2017-10-11 DIAGNOSIS — I252 Old myocardial infarction: Secondary | ICD-10-CM | POA: Diagnosis not present

## 2017-10-11 DIAGNOSIS — N189 Chronic kidney disease, unspecified: Secondary | ICD-10-CM | POA: Diagnosis not present

## 2017-10-11 DIAGNOSIS — E039 Hypothyroidism, unspecified: Secondary | ICD-10-CM | POA: Diagnosis not present

## 2017-10-11 DIAGNOSIS — R599 Enlarged lymph nodes, unspecified: Secondary | ICD-10-CM | POA: Diagnosis not present

## 2017-10-11 DIAGNOSIS — I129 Hypertensive chronic kidney disease with stage 1 through stage 4 chronic kidney disease, or unspecified chronic kidney disease: Secondary | ICD-10-CM | POA: Diagnosis not present

## 2017-10-11 NOTE — Progress Notes (Signed)
[place] [phone]   [phone]  OFFICE PROGRESS NOTE  [name], MD [address] [place] [address]  DIAGNOSIS:  1. Castleman's Disease  2. Idiopathic Thrombocytopenic Purpura. 3. 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  2) Weekly rituximab at 375 mg per meter squared with first cycle given in the hospital. Now status post 2 partial cycles and 7 complete cycles.  3) Maintenance Rituxan 375 mg/M2 every 3 months, status post 12 cycles.  CURRENT THERAPY: Observation.  INTERVAL HISTORY: [name] 62 y.o. female returns to the clinic today for 6 months follow-up visit.  The patient is feeling fine today except for cold symptoms and chest congestion started a few days ago.  She continues to have some sneezing as well as cough productive of whitish sputum.  She denied having any chest pain, shortness of breath or hemoptysis.  She denied having any fever or chills.  She has no nausea, vomiting, diarrhea or constipation.  She has no weight loss or night sweats.  She has no palpable lymphadenopathy.  She is here today for evaluation with repeat CT scan of the chest, abdomen and pelvis for restaging of her disease.   MEDICAL HISTORY: Past Medical History:  Diagnosis Date  . Carpal tunnel syndrome   . Castleman's disease ([place])   . Chronic kidney disease    failure  . Heart attack ([place]) 2007  . Heart attack ([place]) 2007  . Hypertension   . Hypothyroidism   . ITP (idiopathic thrombocytopenic purpura) 06/17/2011  . Pulmonary nodule 10/10/2016  . Retroperitoneal lymphadenopathy 2012  . Stroke [facility])     ALLERGIES:  is allergic to cefdinir; levofloxacin; and vicodin [hydrocodone-acetaminophen].  MEDICATIONS:  Current Outpatient Medications  Medication Sig Dispense Refill  . ALPRAZolam  (XANAX) 0.25 MG tablet Take 0.5 mg by mouth at bedtime as needed for sleep. For sleep    . clopidogrel (PLAVIX) 75 MG tablet Take 1 tablet (75 mg total) by mouth daily. 90 tablet 1  . levothyroxine (SYNTHROID, LEVOTHROID) 88 MCG tablet Take 88 mcg by mouth daily before breakfast.    . metoprolol succinate (TOPROL-XL) 25 MG 24 hr tablet Take 12.5 mg by mouth daily.     . nicotine (NICODERM CQ - DOSED IN MG/24 HOURS) 21 mg/24hr patch Place 14 mg onto the skin daily.     . pantoprazole (PROTONIX) 20 MG tablet Take 20 mg by mouth daily.    . rosuvastatin (CRESTOR) 40 MG tablet Take 1 tablet (40 mg total) by mouth daily. 90 tablet 1   No current facility-administered medications for this visit.    Facility-Administered Medications Ordered in Other Visits  Medication Dose Route Frequency Provider Last Rate Last Dose  . sodium chloride 0.9 % injection 10 mL  10 mL Intracatheter PRN Curt Bears, MD        SURGICAL HISTORY:  Past Surgical History:  Procedure Laterality Date  . APPENDECTOMY     62 years old  . CESAREAN SECTION  1983 1989    REVIEW OF SYSTEMS:  A comprehensive review of systems was negative except for: Ears, nose, mouth, throat, and face: positive for nasal congestion Respiratory: positive for cough   PHYSICAL EXAMINATION: General appearance: alert, cooperative and no distress Head: Normocephalic, without obvious abnormality, atraumatic Neck: no adenopathy Lymph nodes: Cervical, supraclavicular, and axillary nodes normal. Resp:  clear to auscultation bilaterally Back: symmetric, no curvature. ROM normal. No CVA tenderness. Cardio: regular rate and rhythm, S1, S2 normal, no murmur, click, rub or gallop GI: soft, non-tender; bowel sounds normal; no masses,  no organomegaly Extremities: extremities normal, atraumatic, no cyanosis or edema  ECOG PERFORMANCE STATUS: 1 - Symptomatic but completely ambulatory  Blood pressure (!) 155/95, pulse 94, temperature 97.7 F (36.5  C), temperature source Oral, resp. rate 20, height 5\' 3"  (1.6 m), weight 187 lb 1.6 oz (84.9 kg), SpO2 97 %.  LABORATORY DATA: Lab Results  Component Value Date   WBC 6.0 10/09/2017   HGB 14.0 10/09/2017   HCT 42.2 10/09/2017   MCV 92.1 10/09/2017   PLT 234 10/09/2017      Chemistry      Component Value Date/Time   NA 140 10/09/2017 1243   NA 142 04/07/2017 1017   K 4.7 10/09/2017 1243   K 4.5 04/07/2017 1017   CL 104 10/09/2017 1243   CL 106 01/18/2013 0851   CO2 30 (H) 10/09/2017 1243   CO2 28 04/07/2017 1017   BUN 16 10/09/2017 1243   BUN 10.4 04/07/2017 1017   CREATININE 0.86 10/09/2017 1243   CREATININE 0.8 04/07/2017 1017      Component Value Date/Time   CALCIUM 9.8 10/09/2017 1243   CALCIUM 9.9 04/07/2017 1017   ALKPHOS 101 10/09/2017 1243   ALKPHOS 116 04/07/2017 1017   AST 15 10/09/2017 1243   AST 18 04/07/2017 1017   ALT 10 10/09/2017 1243   ALT 11 04/07/2017 1017   BILITOT 0.4 10/09/2017 1243   BILITOT 0.38 04/07/2017 1017       RADIOGRAPHIC STUDIES: Ct Chest W Contrast  Result Date: 10/09/2017 CLINICAL DATA:  Castleman's disease. EXAM: CT CHEST, ABDOMEN, AND PELVIS WITH CONTRAST TECHNIQUE: Multidetector CT imaging of the chest, abdomen and pelvis was performed following the standard protocol during bolus administration of intravenous contrast. CONTRAST:  100 cc Isovue-300. COMPARISON:  04/07/2017. FINDINGS: CT CHEST FINDINGS Cardiovascular: Atherosclerotic calcification of the arterial vasculature, including coronary arteries. Heart size normal. No pericardial effusion. Mediastinum/Nodes: No pathologically enlarged mediastinal or hilar lymph nodes. Left axillary lymph nodes are mildly hyperattenuating and measure up to 9 mm, previously 12 mm. Right subpectoral lymph nodes measure up to 10 mm, stable. Esophagus is grossly unremarkable. Lungs/Pleura: Mild biapical pleuroparenchymal scarring. Moderate centrilobular and mild paraseptal emphysema. Clustered micro  nodularity in the medial left upper lobe is unchanged. Lungs are otherwise clear. No pleural fluid. Airway is unremarkable. Musculoskeletal: No worrisome lytic or sclerotic lesions. CT ABDOMEN PELVIS FINDINGS Hepatobiliary: Liver measures 20.5 cm. Stones are seen in the gallbladder. No biliary ductal dilatation. Pancreas: Negative. Spleen: Negative. Adrenals/Urinary Tract: Adrenal glands and right kidney are unremarkable. Subcentimeter low-attenuation lesion in the left kidney is too small to characterize. Ureters are decompressed. Bladder is grossly unremarkable. Stomach/Bowel: Stomach, small bowel and colon are unremarkable. Appendix is not well-visualized. Vascular/Lymphatic: Atherosclerotic calcification of the arterial vasculature without abdominal aortic aneurysm. Abdominal peritoneal ligament lymph nodes are subcentimeter in short axis size. Abdominal retroperitoneal lymph nodes measure up to 10 mm in the left periaortic station, previously 1.4 cm. Reproductive: Uterus is visualized.  No adnexal mass. Other: No free fluid. Mesenteries and peritoneum are otherwise unremarkable. Musculoskeletal: No worrisome lytic or sclerotic lesions. IMPRESSION: 1. Stable to minimal interval improvement in left axillary and retroperitoneal adenopathy. 2. Aortic atherosclerosis (ICD10-170.0). Coronary artery calcification. 3.  Emphysema (ICD10-J43.9).  Mild hepatomegaly. 4. Cholelithiasis. Electronically Signed   By: Rip Harbour  Blietz M.D.   On: 10/09/2017 16:02   Ct Abdomen Pelvis W Contrast  Result Date: 10/09/2017 CLINICAL DATA:  Castleman's disease. EXAM: CT CHEST, ABDOMEN, AND PELVIS WITH CONTRAST TECHNIQUE: Multidetector CT imaging of the chest, abdomen and pelvis was performed following the standard protocol during bolus administration of intravenous contrast. CONTRAST:  100 cc Isovue-300. COMPARISON:  04/07/2017. FINDINGS: CT CHEST FINDINGS Cardiovascular: Atherosclerotic calcification of the arterial vasculature,  including coronary arteries. Heart size normal. No pericardial effusion. Mediastinum/Nodes: No pathologically enlarged mediastinal or hilar lymph nodes. Left axillary lymph nodes are mildly hyperattenuating and measure up to 9 mm, previously 12 mm. Right subpectoral lymph nodes measure up to 10 mm, stable. Esophagus is grossly unremarkable. Lungs/Pleura: Mild biapical pleuroparenchymal scarring. Moderate centrilobular and mild paraseptal emphysema. Clustered micro nodularity in the medial left upper lobe is unchanged. Lungs are otherwise clear. No pleural fluid. Airway is unremarkable. Musculoskeletal: No worrisome lytic or sclerotic lesions. CT ABDOMEN PELVIS FINDINGS Hepatobiliary: Liver measures 20.5 cm. Stones are seen in the gallbladder. No biliary ductal dilatation. Pancreas: Negative. Spleen: Negative. Adrenals/Urinary Tract: Adrenal glands and right kidney are unremarkable. Subcentimeter low-attenuation lesion in the left kidney is too small to characterize. Ureters are decompressed. Bladder is grossly unremarkable. Stomach/Bowel: Stomach, small bowel and colon are unremarkable. Appendix is not well-visualized. Vascular/Lymphatic: Atherosclerotic calcification of the arterial vasculature without abdominal aortic aneurysm. Abdominal peritoneal ligament lymph nodes are subcentimeter in short axis size. Abdominal retroperitoneal lymph nodes measure up to 10 mm in the left periaortic station, previously 1.4 cm. Reproductive: Uterus is visualized.  No adnexal mass. Other: No free fluid. Mesenteries and peritoneum are otherwise unremarkable. Musculoskeletal: No worrisome lytic or sclerotic lesions. IMPRESSION: 1. Stable to minimal interval improvement in left axillary and retroperitoneal adenopathy. 2. Aortic atherosclerosis (ICD10-170.0). Coronary artery calcification. 3.  Emphysema (ICD10-J43.9).  Mild hepatomegaly. 4. Cholelithiasis. Electronically Signed   By: Lorin Picket M.D.   On: 10/09/2017 16:02    ASSESSMENT AND PLAN:  This is a very pleasant 62 years old white female with Castleman disease status post induction treatment with Rituxan followed by maintenance Rituxan for 12 cycles. The patient has no complaints today.  She has been in observation for several years now. Repeat CT scan of the chest showed no concerning findings for disease progression. I discussed the scan results with the patient and recommended for her to continue in observation with repeat CBC, complaints metabolic panel and LDH in 6 months. She was advised to call immediately if she has any concerning symptoms in the interval. All questions were answered. The patient knows to call the clinic with any problems, questions or concerns. We can certainly see the patient much sooner if necessary. I spent 10 minutes counseling the patient face to face. The total time spent in the appointment was 15 minutes.  Disclaimer: This note was dictated with voice recognition software. Similar sounding words can inadvertently be transcribed and may not be corrected upon review.

## 2017-10-11 NOTE — Telephone Encounter (Signed)
Printed avs and calender of upcoming appointment. Per 3/13 los.  

## 2017-11-22 DIAGNOSIS — E038 Other specified hypothyroidism: Secondary | ICD-10-CM | POA: Diagnosis not present

## 2017-11-22 DIAGNOSIS — E7849 Other hyperlipidemia: Secondary | ICD-10-CM | POA: Diagnosis not present

## 2018-02-14 DIAGNOSIS — I10 Essential (primary) hypertension: Secondary | ICD-10-CM | POA: Diagnosis not present

## 2018-02-14 DIAGNOSIS — E785 Hyperlipidemia, unspecified: Secondary | ICD-10-CM | POA: Diagnosis not present

## 2018-02-14 DIAGNOSIS — Z716 Tobacco abuse counseling: Secondary | ICD-10-CM | POA: Diagnosis not present

## 2018-02-14 DIAGNOSIS — I2510 Atherosclerotic heart disease of native coronary artery without angina pectoris: Secondary | ICD-10-CM | POA: Diagnosis not present

## 2018-02-14 DIAGNOSIS — I252 Old myocardial infarction: Secondary | ICD-10-CM | POA: Diagnosis not present

## 2018-02-14 DIAGNOSIS — E039 Hypothyroidism, unspecified: Secondary | ICD-10-CM | POA: Diagnosis not present

## 2018-02-14 DIAGNOSIS — R69 Illness, unspecified: Secondary | ICD-10-CM | POA: Diagnosis not present

## 2018-02-14 DIAGNOSIS — I6381 Other cerebral infarction due to occlusion or stenosis of small artery: Secondary | ICD-10-CM | POA: Diagnosis not present

## 2018-03-02 DIAGNOSIS — R300 Dysuria: Secondary | ICD-10-CM | POA: Diagnosis not present

## 2018-03-09 ENCOUNTER — Telehealth: Payer: Self-pay

## 2018-03-09 NOTE — Telephone Encounter (Signed)
Patient called and requested for her appointment to pushed back by 2 weeks and she accepted a am appointment. Per 8/9 phone que

## 2018-04-11 ENCOUNTER — Ambulatory Visit: Payer: Medicare HMO | Admitting: Internal Medicine

## 2018-04-11 ENCOUNTER — Other Ambulatory Visit: Payer: Medicare HMO

## 2018-04-24 ENCOUNTER — Encounter: Payer: Self-pay | Admitting: Internal Medicine

## 2018-04-24 ENCOUNTER — Inpatient Hospital Stay (HOSPITAL_BASED_OUTPATIENT_CLINIC_OR_DEPARTMENT_OTHER): Payer: Medicare HMO | Admitting: Internal Medicine

## 2018-04-24 ENCOUNTER — Telehealth: Payer: Self-pay

## 2018-04-24 ENCOUNTER — Inpatient Hospital Stay: Payer: Medicare HMO | Attending: Internal Medicine

## 2018-04-24 VITALS — BP 127/78 | HR 58 | Temp 97.8°F | Resp 18 | Ht 63.0 in | Wt 202.5 lb

## 2018-04-24 DIAGNOSIS — N189 Chronic kidney disease, unspecified: Secondary | ICD-10-CM | POA: Insufficient documentation

## 2018-04-24 DIAGNOSIS — D1801 Hemangioma of skin and subcutaneous tissue: Secondary | ICD-10-CM | POA: Insufficient documentation

## 2018-04-24 DIAGNOSIS — D693 Immune thrombocytopenic purpura: Secondary | ICD-10-CM

## 2018-04-24 DIAGNOSIS — E039 Hypothyroidism, unspecified: Secondary | ICD-10-CM

## 2018-04-24 DIAGNOSIS — I10 Essential (primary) hypertension: Secondary | ICD-10-CM | POA: Diagnosis not present

## 2018-04-24 DIAGNOSIS — Z79899 Other long term (current) drug therapy: Secondary | ICD-10-CM | POA: Insufficient documentation

## 2018-04-24 DIAGNOSIS — D47Z2 Castleman disease: Secondary | ICD-10-CM

## 2018-04-24 DIAGNOSIS — Z9221 Personal history of antineoplastic chemotherapy: Secondary | ICD-10-CM

## 2018-04-24 LAB — CMP (CANCER CENTER ONLY)
ALT: 14 U/L (ref 0–44)
AST: 16 U/L (ref 15–41)
Albumin: 4.2 g/dL (ref 3.5–5.0)
Alkaline Phosphatase: 91 U/L (ref 38–126)
Anion gap: 9 (ref 5–15)
BUN: 12 mg/dL (ref 6–20)
CALCIUM: 9.6 mg/dL (ref 8.9–10.3)
CHLORIDE: 106 mmol/L (ref 98–111)
CO2: 28 mmol/L (ref 22–32)
CREATININE: 0.82 mg/dL (ref 0.44–1.00)
Glucose, Bld: 89 mg/dL (ref 70–99)
POTASSIUM: 4.1 mmol/L (ref 3.5–5.1)
Sodium: 143 mmol/L (ref 135–145)
Total Bilirubin: 0.3 mg/dL (ref 0.3–1.2)
Total Protein: 7 g/dL (ref 6.5–8.1)

## 2018-04-24 LAB — CBC WITH DIFFERENTIAL (CANCER CENTER ONLY)
BASOS ABS: 0.1 10*3/uL (ref 0.0–0.1)
BASOS PCT: 1 %
Eosinophils Absolute: 0.3 10*3/uL (ref 0.0–0.5)
Eosinophils Relative: 3 %
HEMATOCRIT: 43.2 % (ref 34.8–46.6)
HEMOGLOBIN: 14.2 g/dL (ref 11.6–15.9)
LYMPHS PCT: 29 %
Lymphs Abs: 2.4 10*3/uL (ref 0.9–3.3)
MCH: 31.3 pg (ref 25.1–34.0)
MCHC: 32.9 g/dL (ref 31.5–36.0)
MCV: 95.4 fL (ref 79.5–101.0)
MONOS PCT: 9 %
Monocytes Absolute: 0.7 10*3/uL (ref 0.1–0.9)
NEUTROS ABS: 4.8 10*3/uL (ref 1.5–6.5)
Neutrophils Relative %: 58 %
Platelet Count: 257 10*3/uL (ref 145–400)
RBC: 4.53 MIL/uL (ref 3.70–5.45)
RDW: 13 % (ref 11.2–14.5)
WBC Count: 8.2 10*3/uL (ref 3.9–10.3)

## 2018-04-24 LAB — LACTATE DEHYDROGENASE: LDH: 137 U/L (ref 98–192)

## 2018-04-24 NOTE — Progress Notes (Signed)
[facility] [phone]   [phone]  OFFICE PROGRESS NOTE  [name], MD [address] [place] [address]  DIAGNOSIS:  1. Castleman's Disease  2. Idiopathic Thrombocytopenic Purpura. 3. 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  2) Weekly rituximab at 375 mg per meter squared with first cycle given in the hospital. Now status post 2 partial cycles and 7 complete cycles.  3) Maintenance Rituxan 375 mg/M2 every 3 months, status post 12 cycles.  CURRENT THERAPY: Observation.  INTERVAL HISTORY: Zina Pitzer 62 y.o. female clinic today for six-month follow-up visit.  The patient is feeling fine today with no specific complaints except for the slow speech and weakness in the right upper extremity secondary to her previous stroke.  She denied having any current chest pain, shortness of breath, cough or hemoptysis.  She denied having any fever or chills.  She has no nausea, vomiting, diarrhea or constipation.  She has no recent weight loss or night sweats.  She is here today for evaluation with repeat blood work.    MEDICAL HISTORY: Past Medical History:  Diagnosis Date  . Carpal tunnel syndrome   . Castleman's disease ([place])   . Chronic kidney disease    failure  . Heart attack ([place]) 2007  . Heart attack ([place]) 2007  . Hypertension   . Hypothyroidism   . ITP (idiopathic thrombocytopenic purpura) 06/17/2011  . Pulmonary nodule 10/10/2016  . Retroperitoneal lymphadenopathy 2012  . Stroke [facility])     ALLERGIES:  is allergic to cefdinir; levofloxacin; and vicodin [hydrocodone-acetaminophen].  MEDICATIONS:  Current Outpatient Medications  Medication Sig Dispense Refill  . ALPRAZolam (XANAX) 0.25 MG tablet Take 0.5 mg by mouth at bedtime as needed for sleep. For sleep    . clopidogrel (PLAVIX)  75 MG tablet Take 1 tablet (75 mg total) by mouth daily. 90 tablet 1  . levothyroxine (SYNTHROID, LEVOTHROID) 88 MCG tablet Take 88 mcg by mouth daily before breakfast.    . metoprolol succinate (TOPROL-XL) 25 MG 24 hr tablet Take 12.5 mg by mouth daily.     . nicotine (NICODERM CQ - DOSED IN MG/24 HOURS) 21 mg/24hr patch Place 14 mg onto the skin daily.     . pantoprazole (PROTONIX) 20 MG tablet Take 20 mg by mouth daily.    . rosuvastatin (CRESTOR) 40 MG tablet Take 1 tablet (40 mg total) by mouth daily. 90 tablet 1   No current facility-administered medications for this visit.    Facility-Administered Medications Ordered in Other Visits  Medication Dose Route Frequency Provider Last Rate Last Dose  . sodium chloride 0.9 % injection 10 mL  10 mL Intracatheter PRN Curt Bears, MD        SURGICAL HISTORY:  Past Surgical History:  Procedure Laterality Date  . APPENDECTOMY     62 years old  . CESAREAN SECTION  1983 1989    REVIEW OF SYSTEMS:  A comprehensive review of systems was negative except for: Constitutional: positive for fatigue   PHYSICAL EXAMINATION: General appearance: alert, cooperative and no distress Head: Normocephalic, without obvious abnormality, atraumatic Neck: no adenopathy Lymph nodes: Cervical, supraclavicular, and axillary nodes normal. Resp: clear to auscultation bilaterally Back: symmetric, no curvature. ROM normal. No CVA tenderness. Cardio: regular rate and rhythm, S1, S2 normal, no murmur, click, rub or gallop GI: soft, non-tender; bowel sounds normal;  no masses,  no organomegaly Extremities: extremities normal, atraumatic, no cyanosis or edema  ECOG PERFORMANCE STATUS: 1 - Symptomatic but completely ambulatory  Blood pressure 127/78, pulse (!) 58, temperature 97.8 F (36.6 C), temperature source Oral, resp. rate 18, height 5\' 3"  (1.6 m), weight 202 lb 8 oz (91.9 kg), SpO2 99 %.  LABORATORY DATA: Lab Results  Component Value Date   WBC 8.2  04/24/2018   HGB 14.2 04/24/2018   HCT 43.2 04/24/2018   MCV 95.4 04/24/2018   PLT 257 04/24/2018      Chemistry      Component Value Date/Time   NA 140 10/09/2017 1243   NA 142 04/07/2017 1017   K 4.7 10/09/2017 1243   K 4.5 04/07/2017 1017   CL 104 10/09/2017 1243   CL 106 01/18/2013 0851   CO2 30 (H) 10/09/2017 1243   CO2 28 04/07/2017 1017   BUN 16 10/09/2017 1243   BUN 10.4 04/07/2017 1017   CREATININE 0.86 10/09/2017 1243   CREATININE 0.8 04/07/2017 1017      Component Value Date/Time   CALCIUM 9.8 10/09/2017 1243   CALCIUM 9.9 04/07/2017 1017   ALKPHOS 101 10/09/2017 1243   ALKPHOS 116 04/07/2017 1017   AST 15 10/09/2017 1243   AST 18 04/07/2017 1017   ALT 10 10/09/2017 1243   ALT 11 04/07/2017 1017   BILITOT 0.4 10/09/2017 1243   BILITOT 0.38 04/07/2017 1017       RADIOGRAPHIC STUDIES: No results found. ASSESSMENT AND PLAN:  This is a very pleasant 62 years old white female with Castleman disease status post induction treatment with Rituxan followed by maintenance Rituxan for 12 cycles. The patient is currently on observation and she is feeling fine. CBC and comprehensive metabolic panel performed earlier today were unremarkable. I recommended for the patient to continue on observation with repeat blood work as well as CT scan of the chest, abdomen and pelvis in 6 months for restaging of her disease. She was advised to call immediately if she has any concerning symptoms in the interval. All questions were answered. The patient knows to call the clinic with any problems, questions or concerns. We can certainly see the patient much sooner if necessary. I spent 10 minutes counseling the patient face to face. The total time spent in the appointment was 15 minutes.  Disclaimer: This note was dictated with voice recognition software. Similar sounding words can inadvertently be transcribed and may not be corrected upon review.

## 2018-04-24 NOTE — Telephone Encounter (Signed)
Printed avs and calender of upcoming appointment. 9/24 los

## 2018-04-26 DIAGNOSIS — G5601 Carpal tunnel syndrome, right upper limb: Secondary | ICD-10-CM | POA: Diagnosis not present

## 2018-04-27 ENCOUNTER — Other Ambulatory Visit: Payer: Self-pay | Admitting: *Deleted

## 2018-04-27 DIAGNOSIS — G5601 Carpal tunnel syndrome, right upper limb: Secondary | ICD-10-CM

## 2018-05-15 ENCOUNTER — Telehealth: Payer: Self-pay | Admitting: *Deleted

## 2018-05-15 NOTE — Telephone Encounter (Signed)
I called pt LVM unable to reach pt. 

## 2018-05-21 NOTE — Telephone Encounter (Signed)
RN spoke with Dr. Leonie Man and he reviewed the form. He can complete the form. The form will be a no charge. Pt will need to submit a new form, because she answer some of the questions that's only for the MD.

## 2018-05-22 ENCOUNTER — Ambulatory Visit (INDEPENDENT_AMBULATORY_CARE_PROVIDER_SITE_OTHER): Payer: Medicare HMO | Admitting: Neurology

## 2018-05-22 DIAGNOSIS — R82998 Other abnormal findings in urine: Secondary | ICD-10-CM | POA: Diagnosis not present

## 2018-05-22 DIAGNOSIS — E038 Other specified hypothyroidism: Secondary | ICD-10-CM | POA: Diagnosis not present

## 2018-05-22 DIAGNOSIS — G5601 Carpal tunnel syndrome, right upper limb: Secondary | ICD-10-CM | POA: Diagnosis not present

## 2018-05-22 DIAGNOSIS — I10 Essential (primary) hypertension: Secondary | ICD-10-CM | POA: Diagnosis not present

## 2018-05-22 DIAGNOSIS — E7849 Other hyperlipidemia: Secondary | ICD-10-CM | POA: Diagnosis not present

## 2018-05-22 NOTE — Procedures (Signed)
[facility]  [place], [place]  [place], [place] [address] Tel: [phone] Fax:  [phone] Test Date:  05/22/2018  Patient: [name] DOB: 1956-02-16 Physician: Narda Amber, DO  Sex: Female Height: 5\' 3"  Ref Phys: Charlotte Crumb, MD  ID#: [id number] Temp: 35.0C Technician:    Patient Complaints: This is a 62 year old female with history of CTS release on the right with recurrent symptoms of right hand numbness and tingling.  NCV & EMG Findings: Extensive electrodiagnostic testing of the right upper extremity and additional studies of the left shows:  1. Right mixed palmar sensory responses shows prolonged distal peak latency (Median Palm, 2.3 ms).  Bilateral median, bilateral ulnar, and left mixed palmar sensory responses are within normal limits.   2. Bilateral median and ulnar motor responses are within normal limits.   3. There is no evidence of active or chronic motor axonal loss changes affecting any of the tested muscles.  Motor unit configuration and recruitment pattern is within normal limits.    Impression: Right median neuropathy at or distal to the wrist, consistent with a clinical diagnosis of carpal tunnel syndrome.  Overall, these findings are very mild in degree electrically and may be residuals findings of a previously treated carpal tunnel syndrome.   ___________________________ Narda Amber, DO    Nerve Conduction Studies Anti Sensory Summary Table   Site NR Peak (ms) Norm Peak (ms) P-T Amp (V) Norm P-T Amp  Left Median Anti Sensory (2nd Digit)  35C  Wrist    3.3 <3.8 37.1 >10  Right Median Anti Sensory (2nd Digit)  35C  Wrist    3.3 <3.8 28.7 >10  Left Ulnar Anti Sensory (5th Digit)  35C  Wrist    2.4 <3.2 42.4 >5  Right Ulnar Anti Sensory (5th Digit)  35C  Wrist    2.3 <3.2 42.1 >5   Motor Summary Table   Site NR Onset (ms) Norm Onset (ms) O-P Amp (mV) Norm O-P Amp Site1 Site2 Delta-0 (ms) Dist (cm) Vel (m/s) Norm  Vel (m/s)  Left Median Motor (Abd Poll Brev)  35C  Wrist    3.9 <4.0 9.0 >5 Elbow Wrist 4.3 27.0 63 >50  Elbow    8.2  8.7         Right Median Motor (Abd Poll Brev)  35C  Wrist    4.0 <4.0 8.3 >5 Elbow Wrist 4.1 26.0 63 >50  Elbow    8.1  7.3         Left Ulnar Motor (Abd Dig Minimi)  35C  Wrist    2.0 <3.1 8.6 >7 B Elbow Wrist 3.5 22.0 63 >50  B Elbow    5.5  8.5  A Elbow B Elbow 1.8 10.0 56 >50  A Elbow    7.3  7.8         Right Ulnar Motor (Abd Dig Minimi)  35C  Wrist    2.1 <3.1 7.6 >7 B Elbow Wrist 3.4 22.0 65 >50  B Elbow    5.5  7.1  A Elbow B Elbow 1.7 10.0 59 >50  A Elbow    7.2  7.0          Comparison Summary Table   Site NR Peak (ms) Norm Peak (ms) P-T Amp (V) Site1 Site2 Delta-P (ms) Norm Delta (ms)  Left Median/Ulnar Palm Comparison (Wrist - 8cm)  35C  Median Palm    2.0 <2.2 31.9 Median Palm Ulnar Palm 0.3   Ulnar  Palm    1.7 <2.2 27.0      Right Median/Ulnar Palm Comparison (Wrist - 8cm)  35C  Median Palm    2.3 <2.2 31.4 Median Palm Ulnar Palm 1.0   Ulnar Palm    1.3 <2.2 23.8       EMG   Side Muscle Ins Act Fibs Psw Fasc Number Recrt Dur Dur. Amp Amp. Poly Poly. Comment  Right 1stDorInt Nml Nml Nml Nml Nml Nml Nml Nml Nml Nml Nml Nml N/A  Right Abd Poll Brev Nml Nml Nml Nml Nml Nml Nml Nml Nml Nml Nml Nml N/A  Right PronatorTeres Nml Nml Nml Nml Nml Nml Nml Nml Nml Nml Nml Nml N/A  Right Biceps Nml Nml Nml Nml Nml Nml Nml Nml Nml Nml Nml Nml N/A  Right Triceps Nml Nml Nml Nml Nml Nml Nml Nml Nml Nml Nml Nml N/A  Right Deltoid Nml Nml Nml Nml Nml Nml Nml Nml Nml Nml Nml Nml N/A      Waveforms:

## 2018-05-25 DIAGNOSIS — Z1212 Encounter for screening for malignant neoplasm of rectum: Secondary | ICD-10-CM | POA: Diagnosis not present

## 2018-05-29 DIAGNOSIS — I69951 Hemiplegia and hemiparesis following unspecified cerebrovascular disease affecting right dominant side: Secondary | ICD-10-CM | POA: Diagnosis not present

## 2018-05-29 DIAGNOSIS — Z23 Encounter for immunization: Secondary | ICD-10-CM | POA: Diagnosis not present

## 2018-05-29 DIAGNOSIS — I2510 Atherosclerotic heart disease of native coronary artery without angina pectoris: Secondary | ICD-10-CM | POA: Diagnosis not present

## 2018-05-29 DIAGNOSIS — R599 Enlarged lymph nodes, unspecified: Secondary | ICD-10-CM | POA: Diagnosis not present

## 2018-05-29 DIAGNOSIS — Z0000 Encounter for general adult medical examination without abnormal findings: Secondary | ICD-10-CM | POA: Diagnosis not present

## 2018-05-29 DIAGNOSIS — I10 Essential (primary) hypertension: Secondary | ICD-10-CM | POA: Diagnosis not present

## 2018-05-29 DIAGNOSIS — G4709 Other insomnia: Secondary | ICD-10-CM | POA: Diagnosis not present

## 2018-05-29 DIAGNOSIS — E7849 Other hyperlipidemia: Secondary | ICD-10-CM | POA: Diagnosis not present

## 2018-05-29 DIAGNOSIS — R69 Illness, unspecified: Secondary | ICD-10-CM | POA: Diagnosis not present

## 2018-05-29 DIAGNOSIS — R635 Abnormal weight gain: Secondary | ICD-10-CM | POA: Diagnosis not present

## 2018-05-29 DIAGNOSIS — E038 Other specified hypothyroidism: Secondary | ICD-10-CM | POA: Diagnosis not present

## 2018-05-31 ENCOUNTER — Other Ambulatory Visit: Payer: Self-pay | Admitting: Orthopedic Surgery

## 2018-05-31 DIAGNOSIS — G5601 Carpal tunnel syndrome, right upper limb: Secondary | ICD-10-CM

## 2018-06-06 ENCOUNTER — Ambulatory Visit
Admission: RE | Admit: 2018-06-06 | Discharge: 2018-06-06 | Disposition: A | Discharge status: Home / Self Care | Payer: Medicare HMO | Source: Ambulatory Visit | Attending: Orthopedic Surgery | Admitting: Orthopedic Surgery

## 2018-06-06 DIAGNOSIS — G5601 Carpal tunnel syndrome, right upper limb: Secondary | ICD-10-CM | POA: Diagnosis not present

## 2018-06-11 DIAGNOSIS — G5601 Carpal tunnel syndrome, right upper limb: Secondary | ICD-10-CM | POA: Diagnosis not present

## 2018-06-14 DIAGNOSIS — I252 Old myocardial infarction: Secondary | ICD-10-CM | POA: Diagnosis not present

## 2018-06-14 DIAGNOSIS — I10 Essential (primary) hypertension: Secondary | ICD-10-CM | POA: Diagnosis not present

## 2018-06-14 DIAGNOSIS — R69 Illness, unspecified: Secondary | ICD-10-CM | POA: Diagnosis not present

## 2018-06-14 DIAGNOSIS — I2510 Atherosclerotic heart disease of native coronary artery without angina pectoris: Secondary | ICD-10-CM | POA: Diagnosis not present

## 2018-06-14 DIAGNOSIS — E039 Hypothyroidism, unspecified: Secondary | ICD-10-CM | POA: Diagnosis not present

## 2018-06-14 DIAGNOSIS — E785 Hyperlipidemia, unspecified: Secondary | ICD-10-CM | POA: Diagnosis not present

## 2018-06-20 DIAGNOSIS — G5601 Carpal tunnel syndrome, right upper limb: Secondary | ICD-10-CM | POA: Diagnosis not present

## 2018-06-20 DIAGNOSIS — M67431 Ganglion, right wrist: Secondary | ICD-10-CM | POA: Diagnosis not present

## 2018-06-25 DIAGNOSIS — G5601 Carpal tunnel syndrome, right upper limb: Secondary | ICD-10-CM | POA: Diagnosis not present

## 2018-08-09 DIAGNOSIS — R29898 Other symptoms and signs involving the musculoskeletal system: Secondary | ICD-10-CM | POA: Diagnosis not present

## 2018-08-09 DIAGNOSIS — G5601 Carpal tunnel syndrome, right upper limb: Secondary | ICD-10-CM | POA: Diagnosis not present

## 2018-09-11 ENCOUNTER — Encounter: Payer: Self-pay | Admitting: Adult Health

## 2018-10-01 ENCOUNTER — Ambulatory Visit: Payer: Medicare HMO | Admitting: Adult Health

## 2018-10-08 DIAGNOSIS — G5601 Carpal tunnel syndrome, right upper limb: Secondary | ICD-10-CM | POA: Diagnosis not present

## 2018-10-19 ENCOUNTER — Telehealth: Payer: Self-pay | Admitting: Internal Medicine

## 2018-10-19 NOTE — Telephone Encounter (Signed)
Called patient regarding cancellation per 3/20 sch msg she said she will call back to reschedule

## 2018-10-23 ENCOUNTER — Ambulatory Visit (HOSPITAL_COMMUNITY): Payer: Medicare HMO

## 2018-10-23 ENCOUNTER — Other Ambulatory Visit: Payer: Medicare HMO

## 2018-10-30 ENCOUNTER — Ambulatory Visit: Payer: Medicare HMO | Admitting: Internal Medicine

## 2018-12-27 ENCOUNTER — Ambulatory Visit: Payer: Medicare HMO | Admitting: Adult Health

## 2019-01-22 ENCOUNTER — Telehealth: Payer: Self-pay | Admitting: Neurology

## 2019-01-22 DIAGNOSIS — E785 Hyperlipidemia, unspecified: Secondary | ICD-10-CM | POA: Diagnosis not present

## 2019-01-22 DIAGNOSIS — I2510 Atherosclerotic heart disease of native coronary artery without angina pectoris: Secondary | ICD-10-CM | POA: Diagnosis not present

## 2019-01-22 DIAGNOSIS — R69 Illness, unspecified: Secondary | ICD-10-CM | POA: Diagnosis not present

## 2019-01-22 DIAGNOSIS — E039 Hypothyroidism, unspecified: Secondary | ICD-10-CM | POA: Diagnosis not present

## 2019-01-22 DIAGNOSIS — I10 Essential (primary) hypertension: Secondary | ICD-10-CM | POA: Diagnosis not present

## 2019-01-22 DIAGNOSIS — I252 Old myocardial infarction: Secondary | ICD-10-CM | POA: Diagnosis not present

## 2019-01-22 DIAGNOSIS — Z716 Tobacco abuse counseling: Secondary | ICD-10-CM | POA: Diagnosis not present

## 2019-01-22 DIAGNOSIS — J309 Allergic rhinitis, unspecified: Secondary | ICD-10-CM | POA: Diagnosis not present

## 2019-01-22 DIAGNOSIS — Z8673 Personal history of transient ischemic attack (TIA), and cerebral infarction without residual deficits: Secondary | ICD-10-CM | POA: Diagnosis not present

## 2019-01-22 NOTE — Telephone Encounter (Signed)
Pt is calling in wanting to know if it is safe for her to take Melatonin for sleep.

## 2019-01-23 NOTE — Telephone Encounter (Signed)
Please advise patient from a neurological standpoint melatonin would be safe for her but she should also discuss this with her PCP and oncologist prior to starting

## 2019-01-23 NOTE — Telephone Encounter (Signed)
I called pt that per Janett Billow NP from a neurological standpoint melatonin is safe to take with sleep. I stated she also recommend checking with her pcp and oncologist before starting. Pt verbalized understanding.

## 2019-02-08 ENCOUNTER — Telehealth: Payer: Self-pay | Admitting: *Deleted

## 2019-02-08 ENCOUNTER — Telehealth: Payer: Self-pay | Admitting: Internal Medicine

## 2019-02-08 NOTE — Telephone Encounter (Signed)
Scheduled appt per 7/10 sch message - unable to reach pt . Left message with appt date and time

## 2019-02-08 NOTE — Telephone Encounter (Signed)
Received call from patient regarding appts for labs and Dr. Julien Nordmann.  She has CT Scan scheduled for 02/13/19 but no lab appt or f/u with Dr. Julien Nordmann. Lab appt scheduled for 11:15 on the 15th. Her scan is at 12:30 Scheduling message sent for her f/u appt with MD Pt aware and voices understanding

## 2019-02-13 ENCOUNTER — Other Ambulatory Visit: Payer: Self-pay

## 2019-02-13 ENCOUNTER — Inpatient Hospital Stay: Payer: Medicare HMO | Attending: Internal Medicine

## 2019-02-13 ENCOUNTER — Ambulatory Visit (HOSPITAL_COMMUNITY)
Admission: RE | Admit: 2019-02-13 | Discharge: 2019-02-13 | Disposition: A | Discharge status: Home / Self Care | Payer: Medicare HMO | Source: Ambulatory Visit | Attending: Internal Medicine | Admitting: Internal Medicine

## 2019-02-13 DIAGNOSIS — N189 Chronic kidney disease, unspecified: Secondary | ICD-10-CM | POA: Insufficient documentation

## 2019-02-13 DIAGNOSIS — Z79899 Other long term (current) drug therapy: Secondary | ICD-10-CM | POA: Diagnosis not present

## 2019-02-13 DIAGNOSIS — I129 Hypertensive chronic kidney disease with stage 1 through stage 4 chronic kidney disease, or unspecified chronic kidney disease: Secondary | ICD-10-CM | POA: Diagnosis not present

## 2019-02-13 DIAGNOSIS — D47Z2 Castleman disease: Secondary | ICD-10-CM | POA: Insufficient documentation

## 2019-02-13 DIAGNOSIS — E039 Hypothyroidism, unspecified: Secondary | ICD-10-CM | POA: Insufficient documentation

## 2019-02-13 LAB — CMP (CANCER CENTER ONLY)
ALT: 15 U/L (ref 0–44)
AST: 18 U/L (ref 15–41)
Albumin: 4.3 g/dL (ref 3.5–5.0)
Alkaline Phosphatase: 87 U/L (ref 38–126)
Anion gap: 8 (ref 5–15)
BUN: 14 mg/dL (ref 8–23)
CO2: 28 mmol/L (ref 22–32)
Calcium: 9.3 mg/dL (ref 8.9–10.3)
Chloride: 105 mmol/L (ref 98–111)
Creatinine: 0.92 mg/dL (ref 0.44–1.00)
GFR, Est AFR Am: >60 mL/min (ref >60)
GFR, Estimated: >60 mL/min (ref >60)
Glucose, Bld: 98 mg/dL (ref 70–99)
Potassium: 4.3 mmol/L (ref 3.5–5.1)
Sodium: 141 mmol/L (ref 135–145)
Total Bilirubin: 0.3 mg/dL (ref 0.3–1.2)
Total Protein: 7.1 g/dL (ref 6.5–8.1)

## 2019-02-13 LAB — CBC WITH DIFFERENTIAL (CANCER CENTER ONLY)
Abs Immature Granulocytes: 0.03 10*3/uL (ref 0.00–0.07)
Basophils Absolute: 0.1 10*3/uL (ref 0.0–0.1)
Basophils Relative: 1 %
Eosinophils Absolute: 0.2 10*3/uL (ref 0.0–0.5)
Eosinophils Relative: 2 %
HCT: 45.6 % (ref 36.0–46.0)
Hemoglobin: 14.7 g/dL (ref 12.0–15.0)
Immature Granulocytes: 0 %
Lymphocytes Relative: 29 %
Lymphs Abs: 2.6 10*3/uL (ref 0.7–4.0)
MCH: 30.8 pg (ref 26.0–34.0)
MCHC: 32.2 g/dL (ref 30.0–36.0)
MCV: 95.6 fL (ref 80.0–100.0)
Monocytes Absolute: 0.7 10*3/uL (ref 0.1–1.0)
Monocytes Relative: 7 %
Neutro Abs: 5.4 10*3/uL (ref 1.7–7.7)
Neutrophils Relative %: 61 %
Platelet Count: 265 10*3/uL (ref 150–400)
RBC: 4.77 MIL/uL (ref 3.87–5.11)
RDW: 12.6 % (ref 11.5–15.5)
WBC Count: 9 10*3/uL (ref 4.0–10.5)
nRBC: 0 % (ref 0.0–0.2)

## 2019-02-13 LAB — LACTATE DEHYDROGENASE: LDH: 156 U/L (ref 98–192)

## 2019-02-13 MED ORDER — IOHEXOL 300 MG/ML  SOLN
100.0000 mL | Freq: Once | INTRAMUSCULAR | Status: AC | PRN
  Administered 2019-02-13: 12:00:00 100 mL via INTRAVENOUS

## 2019-02-13 MED ORDER — SODIUM CHLORIDE (PF) 0.9 % IJ SOLN
INTRAMUSCULAR | Status: AC
  Filled 2019-02-13: qty 50

## 2019-02-18 ENCOUNTER — Telehealth: Payer: Self-pay | Admitting: Internal Medicine

## 2019-02-18 ENCOUNTER — Encounter: Payer: Self-pay | Admitting: Internal Medicine

## 2019-02-18 ENCOUNTER — Other Ambulatory Visit: Payer: Self-pay

## 2019-02-18 ENCOUNTER — Inpatient Hospital Stay (HOSPITAL_BASED_OUTPATIENT_CLINIC_OR_DEPARTMENT_OTHER): Payer: Medicare HMO | Admitting: Internal Medicine

## 2019-02-18 VITALS — BP 119/76 | HR 84 | Temp 99.1°F | Resp 18 | Ht 63.0 in | Wt 203.5 lb

## 2019-02-18 DIAGNOSIS — N189 Chronic kidney disease, unspecified: Secondary | ICD-10-CM | POA: Diagnosis not present

## 2019-02-18 DIAGNOSIS — I129 Hypertensive chronic kidney disease with stage 1 through stage 4 chronic kidney disease, or unspecified chronic kidney disease: Secondary | ICD-10-CM | POA: Diagnosis not present

## 2019-02-18 DIAGNOSIS — Z79899 Other long term (current) drug therapy: Secondary | ICD-10-CM | POA: Diagnosis not present

## 2019-02-18 DIAGNOSIS — E039 Hypothyroidism, unspecified: Secondary | ICD-10-CM | POA: Diagnosis not present

## 2019-02-18 DIAGNOSIS — D47Z2 Castleman disease: Secondary | ICD-10-CM | POA: Diagnosis not present

## 2019-02-18 NOTE — Telephone Encounter (Signed)
Mailed letter with appt date and time per 7/20 LOS 

## 2019-02-18 NOTE — Progress Notes (Signed)
[place] [phone]   [phone]  OFFICE PROGRESS NOTE  [name], MD [address] [place] [address]  DIAGNOSIS:  1. Castleman's Disease  2. Idiopathic Thrombocytopenic Purpura. 3. 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  2) Weekly rituximab at 375 mg per meter squared with first cycle given in the hospital. Now status post 2 partial cycles and 7 complete cycles.  3) Maintenance Rituxan 375 mg/M2 every 3 months, status post 12 cycles.  CURRENT THERAPY: Observation.  INTERVAL HISTORY: [name] 63 y.o. female returns to the clinic today for follow-up visit.  The patient is feeling fine today with no concerning complaints.  She denied having any chest pain, shortness of breath, cough or hemoptysis.  She denied having any fever or chills.  She has no nausea, vomiting, diarrhea or constipation.  She denied having any weight loss or night sweats.  She has no headache or visual changes.  She had repeat CT scan of the chest, abdomen pelvis performed recently and she is here for evaluation and discussion of her scan results.  MEDICAL HISTORY: Past Medical History:  Diagnosis Date   Carpal tunnel syndrome    Castleman's disease ([place])    Chronic kidney disease    failure   Heart attack ([place]) 2007   Heart attack ([place]) 2007   Hypertension    Hypothyroidism    ITP (idiopathic thrombocytopenic purpura) 06/17/2011   Pulmonary nodule 10/10/2016   Retroperitoneal lymphadenopathy 2012   Stroke [facility])     ALLERGIES:  is allergic to cefdinir; levofloxacin; and vicodin [hydrocodone-acetaminophen].  MEDICATIONS:  Current Outpatient Medications  Medication Sig Dispense Refill   ALPRAZolam (XANAX) 0.25 MG tablet Take 0.5 mg by mouth at bedtime as needed for sleep. For sleep      clopidogrel (PLAVIX) 75 MG tablet Take 1 tablet (75 mg total) by mouth daily. 90 tablet 1   levothyroxine (SYNTHROID, LEVOTHROID) 88 MCG tablet Take 88 mcg by mouth daily before breakfast.     metoprolol succinate (TOPROL-XL) 25 MG 24 hr tablet Take 12.5 mg by mouth daily.      nicotine (NICODERM CQ - DOSED IN MG/24 HOURS) 21 mg/24hr patch Place 14 mg onto the skin daily.      pantoprazole (PROTONIX) 20 MG tablet Take 20 mg by mouth daily.     rosuvastatin (CRESTOR) 40 MG tablet Take 1 tablet (40 mg total) by mouth daily. 90 tablet 1   No current facility-administered medications for this visit.    Facility-Administered Medications Ordered in Other Visits  Medication Dose Route Frequency Provider Last Rate Last Dose   sodium chloride 0.9 % injection 10 mL  10 mL Intracatheter PRN Curt Bears, MD        SURGICAL HISTORY:  Past Surgical History:  Procedure Laterality Date   APPENDECTOMY     63 years old   [place]:  A comprehensive review of systems was negative except for: Constitutional: positive for fatigue   PHYSICAL EXAMINATION: General appearance: alert, cooperative and no distress Head: Normocephalic, without obvious abnormality, atraumatic Neck: no adenopathy Lymph nodes: Cervical, supraclavicular, and axillary nodes normal. Resp: clear to auscultation bilaterally Back: symmetric, no curvature. ROM normal. No CVA tenderness. Cardio: regular rate and rhythm, S1, S2 normal, no murmur, click, rub or gallop GI:  soft, non-tender; bowel sounds normal; no masses,  no organomegaly Extremities: extremities normal, atraumatic, no cyanosis or edema  ECOG PERFORMANCE STATUS: 1 - Symptomatic but completely ambulatory  Blood pressure 119/76, pulse 84, temperature 99.1 F (37.3 C), temperature source Oral, resp. rate 18, height 5\' 3"  (1.6 m), weight 203 lb 8 oz (92.3 kg), SpO2 98 %.  LABORATORY DATA: Lab Results  Component Value  Date   WBC 9.0 02/13/2019   HGB 14.7 02/13/2019   HCT 45.6 02/13/2019   MCV 95.6 02/13/2019   PLT 265 02/13/2019      Chemistry      Component Value Date/Time   NA 141 02/13/2019 1107   NA 142 04/07/2017 1017   K 4.3 02/13/2019 1107   K 4.5 04/07/2017 1017   CL 105 02/13/2019 1107   CL 106 01/18/2013 0851   CO2 28 02/13/2019 1107   CO2 28 04/07/2017 1017   BUN 14 02/13/2019 1107   BUN 10.4 04/07/2017 1017   CREATININE 0.92 02/13/2019 1107   CREATININE 0.8 04/07/2017 1017      Component Value Date/Time   CALCIUM 9.3 02/13/2019 1107   CALCIUM 9.9 04/07/2017 1017   ALKPHOS 87 02/13/2019 1107   ALKPHOS 116 04/07/2017 1017   AST 18 02/13/2019 1107   AST 18 04/07/2017 1017   ALT 15 02/13/2019 1107   ALT 11 04/07/2017 1017   BILITOT 0.3 02/13/2019 1107   BILITOT 0.38 04/07/2017 1017       RADIOGRAPHIC STUDIES: Ct Chest W Contrast  Result Date: 02/13/2019 CLINICAL DATA:  Castleman's disease. EXAM: CT CHEST, ABDOMEN, AND PELVIS WITH CONTRAST TECHNIQUE: Multidetector CT imaging of the chest, abdomen and pelvis was performed following the standard protocol during bolus administration of intravenous contrast. CONTRAST:  135mL OMNIPAQUE IOHEXOL 300 MG/ML  SOLN COMPARISON:  10/09/2017 FINDINGS: CT CHEST FINDINGS Cardiovascular: The heart size is normal. No substantial pericardial effusion. Coronary artery calcification is evident. Atherosclerotic calcification is noted in the wall of the thoracic aorta. Mediastinum/Nodes: No mediastinal lymphadenopathy. There is no hilar lymphadenopathy. The esophagus has normal imaging features. Prominent upper normal left axillary nodes are stable to minimally decreased in the interval. 8 mm short axis node on 14/2 was 9 mm previously. 8 mm subpectoral left-sided node on 09/02 today measured 10 mm previously. No right axillary or subpectoral lymphadenopathy with 9 mm short axis right subpectoral node on 9/2 decreased from 10 mm previously. Lungs/Pleura:  Centrilobular emphsyema noted. No suspicious pulmonary nodule or mass. No focal airspace consolidation. No pleural effusion. Musculoskeletal: No worrisome lytic or sclerotic osseous abnormality. CT ABDOMEN PELVIS FINDINGS Hepatobiliary: No suspicious focal abnormality within the liver parenchyma. Tiny calcified gallstones noted. No intrahepatic or extrahepatic biliary dilation. Pancreas: No focal mass lesion. No dilatation of the main duct. No intraparenchymal cyst. No peripancreatic edema. Spleen: No splenomegaly. No focal mass lesion. Adrenals/Urinary Tract: No adrenal nodule or mass. Kidneys unremarkable. No evidence for hydroureter. The urinary bladder appears normal for the degree of distention. Stomach/Bowel: Stomach is unremarkable. No gastric wall thickening. No evidence of outlet obstruction. Duodenum is normally positioned as is the ligament of Treitz. No small bowel wall thickening. No small bowel dilatation. The terminal ileum is normal. The appendix is not visualized, but there is no edema or inflammation in the region of the cecum. No gross colonic mass. No colonic wall thickening. Diverticular changes are noted in the left colon without evidence of diverticulitis. Vascular/Lymphatic: There is abdominal aortic atherosclerosis without aneurysm. No abdominal lymphadenopathy. Upper normal hepato  duodenal ligament and retroperitoneal lymph nodes again noted. Mildly enlarged left para-aortic node measures 1.1 cm today (73/2) stable in the interval. A left common iliac node (83/2) measures 1.0 cm today compared to 0.6 cm previously. Small lymph nodes are seen along the pelvic sidewall bilaterally. Reproductive: The uterus is unremarkable.  There is no adnexal mass. Other: No intraperitoneal free fluid. Musculoskeletal: No worrisome lytic or sclerotic osseous abnormality. IMPRESSION: 1. Largely stable exam with no clear trend towards improvement or worsening of lymph nodes in the chest, abdomen, and pelvis.  Thoracic lymph nodes appears stable to minimally decreased in size while abdominal lymph nodes are largely stable with the exception of 1 node in the left common iliac chain which is mildly enlarged since prior study. 2. Cholelithiasis. 3.  Aortic Atherosclerois (ICD10-170.0) 4.  Emphysema. ([id number]-X44.9) Electronically Signed   By: Misty Stanley M.D.   On: 02/13/2019 15:32   Ct Abdomen Pelvis W Contrast  Result Date: 02/13/2019 CLINICAL DATA:  Castleman's disease. EXAM: CT CHEST, ABDOMEN, AND PELVIS WITH CONTRAST TECHNIQUE: Multidetector CT imaging of the chest, abdomen and pelvis was performed following the standard protocol during bolus administration of intravenous contrast. CONTRAST:  1107mL OMNIPAQUE IOHEXOL 300 MG/ML  SOLN COMPARISON:  10/09/2017 FINDINGS: CT CHEST FINDINGS Cardiovascular: The heart size is normal. No substantial pericardial effusion. Coronary artery calcification is evident. Atherosclerotic calcification is noted in the wall of the thoracic aorta. Mediastinum/Nodes: No mediastinal lymphadenopathy. There is no hilar lymphadenopathy. The esophagus has normal imaging features. Prominent upper normal left axillary nodes are stable to minimally decreased in the interval. 8 mm short axis node on 14/2 was 9 mm previously. 8 mm subpectoral left-sided node on 09/02 today measured 10 mm previously. No right axillary or subpectoral lymphadenopathy with 9 mm short axis right subpectoral node on 9/2 decreased from 10 mm previously. Lungs/Pleura: Centrilobular emphsyema noted. No suspicious pulmonary nodule or mass. No focal airspace consolidation. No pleural effusion. Musculoskeletal: No worrisome lytic or sclerotic osseous abnormality. CT ABDOMEN PELVIS FINDINGS Hepatobiliary: No suspicious focal abnormality within the liver parenchyma. Tiny calcified gallstones noted. No intrahepatic or extrahepatic biliary dilation. Pancreas: No focal mass lesion. No dilatation of the main duct. No intraparenchymal  cyst. No peripancreatic edema. Spleen: No splenomegaly. No focal mass lesion. Adrenals/Urinary Tract: No adrenal nodule or mass. Kidneys unremarkable. No evidence for hydroureter. The urinary bladder appears normal for the degree of distention. Stomach/Bowel: Stomach is unremarkable. No gastric wall thickening. No evidence of outlet obstruction. Duodenum is normally positioned as is the ligament of Treitz. No small bowel wall thickening. No small bowel dilatation. The terminal ileum is normal. The appendix is not visualized, but there is no edema or inflammation in the region of the cecum. No gross colonic mass. No colonic wall thickening. Diverticular changes are noted in the left colon without evidence of diverticulitis. Vascular/Lymphatic: There is abdominal aortic atherosclerosis without aneurysm. No abdominal lymphadenopathy. Upper normal hepato duodenal ligament and retroperitoneal lymph nodes again noted. Mildly enlarged left para-aortic node measures 1.1 cm today (73/2) stable in the interval. A left common iliac node (83/2) measures 1.0 cm today compared to 0.6 cm previously. Small lymph nodes are seen along the pelvic sidewall bilaterally. Reproductive: The uterus is unremarkable.  There is no adnexal mass. Other: No intraperitoneal free fluid. Musculoskeletal: No worrisome lytic or sclerotic osseous abnormality. IMPRESSION: 1. Largely stable exam with no clear trend towards improvement or worsening of lymph nodes in the chest, abdomen, and pelvis. Thoracic lymph nodes appears stable to  minimally decreased in size while abdominal lymph nodes are largely stable with the exception of 1 node in the left common iliac chain which is mildly enlarged since prior study. 2. Cholelithiasis. 3.  Aortic Atherosclerois (ICD10-170.0) 4.  Emphysema. ([id number]) Electronically Signed   By: Misty Stanley M.D.   On: 02/13/2019 15:32   ASSESSMENT AND PLAN:  This is a very pleasant 63 years old white female with  Castleman disease status post induction treatment with Rituxan followed by maintenance Rituxan for 12 cycles. The patient is currently on observation. She is doing fine with no concerning complaints. She had repeat CT scan of the chest, abdomen pelvis performed recently.  I personally and independently reviewed the scans and discussed the results with the patient today. Her scan showed no concerning findings for disease progression. I recommended for the patient to continue on observation with repeat blood work in 6 months. The patient was advised to call immediately if she has any concerning symptoms in the interval. All questions were answered. The patient knows to call the clinic with any problems, questions or concerns. We can certainly see the patient much sooner if necessary. I spent 10 minutes counseling the patient face to face. The total time spent in the appointment was 15 minutes.  Disclaimer: This note was dictated with voice recognition software. Similar sounding words can inadvertently be transcribed and may not be corrected upon review.

## 2019-03-28 ENCOUNTER — Ambulatory Visit: Payer: Medicare HMO | Admitting: Adult Health

## 2019-03-28 DIAGNOSIS — R69 Illness, unspecified: Secondary | ICD-10-CM | POA: Diagnosis not present

## 2019-04-04 ENCOUNTER — Encounter: Payer: Self-pay | Admitting: Adult Health

## 2019-04-04 ENCOUNTER — Other Ambulatory Visit: Payer: Self-pay

## 2019-04-04 ENCOUNTER — Ambulatory Visit: Payer: Medicare HMO | Admitting: Adult Health

## 2019-04-04 VITALS — BP 132/91 | HR 72 | Temp 98.4°F | Ht 63.0 in | Wt 202.8 lb

## 2019-04-04 DIAGNOSIS — G5601 Carpal tunnel syndrome, right upper limb: Secondary | ICD-10-CM | POA: Diagnosis not present

## 2019-04-04 DIAGNOSIS — E785 Hyperlipidemia, unspecified: Secondary | ICD-10-CM | POA: Diagnosis not present

## 2019-04-04 DIAGNOSIS — I63412 Cerebral infarction due to embolism of left middle cerebral artery: Secondary | ICD-10-CM | POA: Diagnosis not present

## 2019-04-04 DIAGNOSIS — G4700 Insomnia, unspecified: Secondary | ICD-10-CM | POA: Diagnosis not present

## 2019-04-04 DIAGNOSIS — G510 Bell's palsy: Secondary | ICD-10-CM

## 2019-04-04 DIAGNOSIS — I10 Essential (primary) hypertension: Secondary | ICD-10-CM

## 2019-04-04 NOTE — Patient Instructions (Signed)
Your Plan:  Continue Plavix 75 mg and Crestor 4 mg daily for secondary stroke prevention  Continue to follow with your PCP for blood pressure and cholesterol monitoring/management  In regards to your sleep, discussed with your PCP and cardiologist use of Ambien, Lunesta or antidepressants that can help with your anxiety and insomnia together.  Long-term use of Xanax is not typically recommended as this type of medication can be addictive and should only be used on a short-term or as needed basis   Overall stable from a stroke standpoint and at this time, you can follow-up as needed regarding your stroke but please call office with any questions or concerns     Thank you for coming to see [place] at [facility]. I hope we have been able to provide you high quality care today.  You may receive a patient satisfaction survey over the next few weeks. We would appreciate your feedback and comments so that we may continue to improve ourselves and the health of our patients.

## 2019-04-04 NOTE — Progress Notes (Signed)
[facility] [address] [place]. [place] [address] [phone]       OFFICE FOLLOW-UP NOTE  Ms. Sashenka Fakes Date of Birth:  10/20/1955 Medical Record Number:  [id number]   Reason for visit: Stroke follow-up    Chief Complaint  Patient presents with   Follow-up    Yearly f/u. Alone. Treatment room. Patient mentioned that she hasnt been sleeping that well.       HPI:   Update 04/04/2019: Ms. Gewirtz is being seen today for stroke follow-up.  She was last seen in 09/2017 with missing prior appointments due to cancellations.  She has been stable from a stroke standpoint since that time.  History of Bell's palsy deficits right facial droop and slightly slurred speech.  Greatest concerns today regarding insomnia for which she currently takes Xanax at bedtime.  Currently discussing with her cardiologist about coming off of Xanax and using a different type of medication but she is frustrated by this as the Xanax has been working well for her and does not understand why it needs to be stopped.  She does appear to be overly anxious and endorses underlying depression but feels as though this is overall stable.  She did undergo decompression of median nerve right wrist in 06/2018 with improvement of her numbness and tingling.  She continues on Plavix and Crestor for secondary stroke prevention without side effects.  Blood pressure today satisfactory 132/91.  No further concerns at this time.   Update 09/28/17 PS: Ms. Kovack is here today for a six-month stroke follow-up.  Patient has complaints of right hand digits 1 through 3 feeling like "dead fingers".  Patient denies numbness, tingling or pain.  Patient states that she did have a carpal tunnel release surgery in April 2018 and her fingers have me look feeling like this since.  She did come to this office to see Dr. Brett Fairy regarding this complaint, and she was told that it was a residual side effect from her stroke.   Patient felt as if there should be something that can be done as far as getting feeling back into her fingers.  Patient states that she will not participate in any type of patient also states that she therapy as she is too old and not going to pay "$80 per week".  Is having right sciatic gum numbness over her right lateral incisor as though it feels like Novocain.  Denies pain or swallowing difficulties.  Patient is continues to take her Plavix without side effects such as increased bleeding or bruising.  Patient continues to take her Crestor without side effects of myalgias.  Patient's blood pressure has been satisfactory and at today's visit 128/55.  Patient is continuing to smoke approximately 10 cigarettes/day.  Patient states that she was attempting to use the nicotine patch 21 mg but complained of night terrors.  She stated that she would take the patch off at night but would wake up wanting a cigarette.  Patient states that she is going to try to decrease patch to 14 mg and see if she is able to keep the patch on around the clock.  Patient denies new or worsening stroke/TIA symptoms.    Initial visit 03/28/2017 PS: Ms. Nong is a 63 year old Caucasian lady with past medical history significant for hypertension, hyperlipidemia, ITP, Castleman's disease   who is seen today for first office follow-up visit following hospital admission for stroke. History is obtained from the patient, review of electronic medical records and have  personally reviewed imaging films.Gulianna Portley an 63 y.o.femalewas seen back in 02/03/2017 at that time with a right facial droop and weakness 2 days and MRI which was negative. She was diagnosed with Bell's palsy and sent home with prednisone. Per patient since that period of time up to now her symptoms of right facial droop, right face numbness and difficulty swallowing and speaking have increased in addition she now has some right arm numbness. MRI was obtained as an  outpatient which showed a subacute peripheral stroke along the periphery of chronic infarct the left frontal lobe. For this reason patient was told to come to the emergency room and have a stroke workup. Patient admits to tobacco abuse and states that she does take aspirin however when her stomach is causing her problems she will not take aspirin at that time. Date last known well: Unable to determine.Time last known well: Unable to determine.tPA Given:No: No exact last known normal. Patient informs me that she's actually had right upper extremity weakness and numbness since November 2017. She was evaluated by Dr. Apolonio Schneiders from orthopedics and Dr. of carpal tunnel. She had 2 separate injections of steroids into her lateral right wrist with suboptimal improvement in her weakness and numbness. She did not have any brain imaging at that time. She was seen in the hospital on June 29 by Dr. Addison Lank and thought to have a dominant facial weakness related to Bell's palsy and started on prednisone and acyclovir. MRI scan of the brain obtained and was negative for acute infarct. rate. She saw Dr. Brett Fairy in office who felt patient may have had a stroke and hence I obtained an outpatient MRI which confirmed a stroke prompting the above admission. She had transthoracic echo which showed normal ejection fraction without obvious cardiac source of embolism.Hemoglobin A1c was 6.1. LDL cholesterol was elevated at 143 mg percent. Carotid ultrasound showed no significant extracranial stenosis. MRA of the brain showed multiple areas of irregularity in the anterior and posterior division of the M2 segment of the left middle cerebral artery with occluded left M2 segment. There was also moderate atheromatous narrowing of the left cavernous carotid artery which had progressed compared with previous MRA from 2012. The left vertebral artery was also occluded.        ROS:   14 system review of systems is positive for environmental  allergies, frequency of urination, urgency, insomnia, memory loss, speech difficulty and depression and all other systems negative  PMH:  Past Medical History:  Diagnosis Date   Carpal tunnel syndrome    Castleman's disease ([place])    Chronic kidney disease    failure   Heart attack ([place]) 2007   Heart attack ([place]) 2007   Hypertension    Hypothyroidism    ITP (idiopathic thrombocytopenic purpura) 06/17/2011   Pulmonary nodule 10/10/2016   Retroperitoneal lymphadenopathy 2012   Stroke [facility])     Social History:  Social History   Socioeconomic History   Marital status: Married    Spouse name: Not on file   Number of children: 2   Years of education: Not on file   Highest education level: Some college, no degree  Occupational History   Not on file  Social Needs   Financial resource strain: Not on file   Food insecurity    Worry: Not on file    Inability: Not on file   Transportation needs    Medical: Not on file    Non-medical: Not on file  Tobacco Use  Smoking status: Current Every Day Smoker    Packs/day: 0.50    Years: 25.00    Pack years: 12.50    Types: Cigarettes   Smokeless tobacco: Never Used   Tobacco comment: vapor cig sometimes  Substance and Sexual Activity   Alcohol use: Yes    Alcohol/week: 1.0 standard drinks    Types: 1 Glasses of wine per week    Comment: "occasssionally"   Drug use: No   Sexual activity: Not Currently  Lifestyle   Physical activity    Days per week: Not on file    Minutes per session: Not on file   Stress: Not on file  Relationships   Social connections    Talks on phone: Not on file    Gets together: Not on file    Attends religious service: Not on file    Active member of club or organization: Not on file    Attends meetings of clubs or organizations: Not on file    Relationship status: Not on file   Intimate partner violence    Fear of current or ex partner: Not on file    Emotionally  abused: Not on file    Physically abused: Not on file    Forced sexual activity: Not on file  Other Topics Concern   Not on file  Social History Narrative   Right handed   Lives at home with her husband   Drinks 3 cups of caffeine daily    Medications:   Current Outpatient Medications on File Prior to Visit  Medication Sig Dispense Refill   ALPRAZolam (XANAX) 0.25 MG tablet Take 0.5 mg by mouth at bedtime as needed for sleep. For sleep     clopidogrel (PLAVIX) 75 MG tablet Take 1 tablet (75 mg total) by mouth daily. 90 tablet 1   levothyroxine (SYNTHROID, LEVOTHROID) 88 MCG tablet Take 88 mcg by mouth daily before breakfast.     metoprolol succinate (TOPROL-XL) 25 MG 24 hr tablet Take 12.5 mg by mouth daily.      nicotine (NICODERM CQ - DOSED IN MG/24 HOURS) 21 mg/24hr patch Place 14 mg onto the skin daily.      pantoprazole (PROTONIX) 20 MG tablet Take 20 mg by mouth daily.     rosuvastatin (CRESTOR) 40 MG tablet Take 1 tablet (40 mg total) by mouth daily. 90 tablet 1   Current Facility-Administered Medications on File Prior to Visit  Medication Dose Route Frequency Provider Last Rate Last Dose   sodium chloride 0.9 % injection 10 mL  10 mL Intracatheter PRN Curt Bears, MD        Allergies:   Allergies  Allergen Reactions   Cefdinir Other (See Comments)    Causes vision problems   Levofloxacin Itching   Vicodin [Hydrocodone-Acetaminophen] Itching    Today's Vitals   04/04/19 1527  BP: (!) 132/91  Pulse: 72  Temp: 98.4 F (36.9 C)  TempSrc: Oral  Weight: 202 lb 12.8 oz (92 kg)  Height: 5\' 3"  (1.6 m)   Body mass index is 35.92 kg/m.  Physical Exam General: well developed, middle-aged Caucasian female, well nourished, seated, in no evident distress Head: head normocephalic and atraumatic.  Neck: supple with no carotid or supraclavicular bruits Cardiovascular: regular rate and rhythm, no murmurs Musculoskeletal: no deformity Skin:  no  rash/petichiae Vascular:  Normal pulses all extremities  Neurologic Exam Mental Status: Awake and fully alert. Oriented to place and time. Recent and remote memory intact. Attention span, concentration and  fund of knowledge appropriate. Mood and affect appropriate. Mild dysarthria present. Cranial Nerves: Pupils equal, briskly reactive to light. Extraocular movements full without nystagmus. Visual fields full to confrontation. Hearing intact. Facial sensation intact.  Mild right facial asymmetry noted secondary to Bell's palsy history. Motor: Normal bulk and tone. Normal strength in all tested extremity muscles.  Normal and symmetrical strength tested in all extremities.   Sensory.:  Intact to light touch, vibration and pinprick throughout all extremities Coordination: Rapid alternating movements normal in all extremities. Finger-to-nose and heel-to-shin performed accurately bilaterally. Gait and Station: Arises from chair without difficulty. Stance is normal. Gait demonstrates normal stride length and balance . Able to heel, toe and tandem walk without difficulty.  Reflexes: 1+ and symmetric. Toes downgoing.      ASSESSMENT: 7 year Caucasian lady  with recurrent left MCA branch infarcts likely due to intracranial atherosclerosis. Multiple vascular risk factors of smoking, hyperlipidemia and hypertension. History of Castleman's disease and ITP likely unrelated to her stroke.  Also has history of Bell's palsy with residual right-sided facial weakness and mild dysarthria.  She did undergo decompression of median nerve in 06/2018 with improvement of right arm symptoms with prior carpal tunnel release in 10/2016.  She has been stable from a stroke standpoint since prior visit with greatest concern today regarding ongoing insomnia with long-term use of Xanax   PLAN: -Continue Plavix and Crestor for secondary stroke prevention -Continue to follow with PCP for HLD and HTN management -Discussion  regarding insomnia and ongoing use of Xanax.  She was advised that Xanax is not recommended for long-term daily use due to addictive properties and potential long-term effects.  Discussion regarding use of antidepressant for management of anxiety which could also help with her insomnia concerns.  She was provided additional information regarding things that she can do at home independently to help with insomnia.  She was advised to follow-up with her PCP or cardiologist regarding change of medications as they have previously recommended and ongoing management - maintain strict control of hypertension with blood pressure goal below 130/90, diabetes with hemoglobin A1c goal below 6.5% and lipids with LDL cholesterol goal below 70 mg/dL. I also advised the patient to eat a healthy diet with plenty of whole grains, cereals, fruits and vegetables, exercise regularly and maintain ideal body weight Followup in the future with Janett Billow, NP in 1 year  Overall stable from neurological standpoint recommend follow-up as needed    Greater than 50% of time during this 25 minute visit was spent on discussion of insomnia concerns, explanation of diagnosis of intracranial atherosclerosis, importance of ongoing secondary stroke prevention management, planning of further management, discussion with patient and answering all questions to patient satisfaction  Frann Rider, AGNP-BC  [facility] [address] [place], [place] [address]  Phone [phone] Fax [phone] Note: This document was prepared with digital dictation and possible smart phrase technology. Any transcriptional errors that result from this process are unintentional.

## 2019-04-10 NOTE — Progress Notes (Signed)
I agree with the above plan 

## 2019-05-29 DIAGNOSIS — R69 Illness, unspecified: Secondary | ICD-10-CM | POA: Diagnosis not present

## 2019-05-29 DIAGNOSIS — I2510 Atherosclerotic heart disease of native coronary artery without angina pectoris: Secondary | ICD-10-CM | POA: Diagnosis not present

## 2019-05-29 DIAGNOSIS — J309 Allergic rhinitis, unspecified: Secondary | ICD-10-CM | POA: Diagnosis not present

## 2019-05-29 DIAGNOSIS — E039 Hypothyroidism, unspecified: Secondary | ICD-10-CM | POA: Diagnosis not present

## 2019-05-29 DIAGNOSIS — I10 Essential (primary) hypertension: Secondary | ICD-10-CM | POA: Diagnosis not present

## 2019-05-29 DIAGNOSIS — G510 Bell's palsy: Secondary | ICD-10-CM | POA: Diagnosis not present

## 2019-05-29 DIAGNOSIS — I6381 Other cerebral infarction due to occlusion or stenosis of small artery: Secondary | ICD-10-CM | POA: Diagnosis not present

## 2019-05-29 DIAGNOSIS — E785 Hyperlipidemia, unspecified: Secondary | ICD-10-CM | POA: Diagnosis not present

## 2019-06-04 DIAGNOSIS — E038 Other specified hypothyroidism: Secondary | ICD-10-CM | POA: Diagnosis not present

## 2019-06-04 DIAGNOSIS — E7849 Other hyperlipidemia: Secondary | ICD-10-CM | POA: Diagnosis not present

## 2019-06-05 DIAGNOSIS — R82998 Other abnormal findings in urine: Secondary | ICD-10-CM | POA: Diagnosis not present

## 2019-06-05 DIAGNOSIS — I10 Essential (primary) hypertension: Secondary | ICD-10-CM | POA: Diagnosis not present

## 2019-06-06 DIAGNOSIS — Z1212 Encounter for screening for malignant neoplasm of rectum: Secondary | ICD-10-CM | POA: Diagnosis not present

## 2019-06-07 DIAGNOSIS — R599 Enlarged lymph nodes, unspecified: Secondary | ICD-10-CM | POA: Diagnosis not present

## 2019-06-07 DIAGNOSIS — J310 Chronic rhinitis: Secondary | ICD-10-CM | POA: Diagnosis not present

## 2019-06-07 DIAGNOSIS — R202 Paresthesia of skin: Secondary | ICD-10-CM | POA: Diagnosis not present

## 2019-06-07 DIAGNOSIS — Z1331 Encounter for screening for depression: Secondary | ICD-10-CM | POA: Diagnosis not present

## 2019-06-07 DIAGNOSIS — I10 Essential (primary) hypertension: Secondary | ICD-10-CM | POA: Diagnosis not present

## 2019-06-07 DIAGNOSIS — E039 Hypothyroidism, unspecified: Secondary | ICD-10-CM | POA: Diagnosis not present

## 2019-06-07 DIAGNOSIS — I2510 Atherosclerotic heart disease of native coronary artery without angina pectoris: Secondary | ICD-10-CM | POA: Diagnosis not present

## 2019-06-07 DIAGNOSIS — E785 Hyperlipidemia, unspecified: Secondary | ICD-10-CM | POA: Diagnosis not present

## 2019-06-07 DIAGNOSIS — G4700 Insomnia, unspecified: Secondary | ICD-10-CM | POA: Diagnosis not present

## 2019-06-07 DIAGNOSIS — Z0000 Encounter for general adult medical examination without abnormal findings: Secondary | ICD-10-CM | POA: Diagnosis not present

## 2019-06-07 DIAGNOSIS — Z1339 Encounter for screening examination for other mental health and behavioral disorders: Secondary | ICD-10-CM | POA: Diagnosis not present

## 2019-06-07 DIAGNOSIS — R69 Illness, unspecified: Secondary | ICD-10-CM | POA: Diagnosis not present

## 2019-07-02 ENCOUNTER — Other Ambulatory Visit: Payer: Self-pay | Admitting: Orthopedic Surgery

## 2019-07-02 DIAGNOSIS — Z1231 Encounter for screening mammogram for malignant neoplasm of breast: Secondary | ICD-10-CM

## 2019-08-19 ENCOUNTER — Encounter: Payer: Self-pay | Admitting: Internal Medicine

## 2019-08-19 ENCOUNTER — Inpatient Hospital Stay: Payer: Medicare HMO

## 2019-08-19 ENCOUNTER — Inpatient Hospital Stay: Payer: Medicare HMO | Attending: Internal Medicine | Admitting: Internal Medicine

## 2019-08-19 ENCOUNTER — Other Ambulatory Visit: Payer: Self-pay

## 2019-08-19 VITALS — BP 123/69 | HR 57 | Temp 98.0°F | Resp 18 | Ht 63.0 in | Wt 201.4 lb

## 2019-08-19 DIAGNOSIS — N189 Chronic kidney disease, unspecified: Secondary | ICD-10-CM | POA: Insufficient documentation

## 2019-08-19 DIAGNOSIS — Z8673 Personal history of transient ischemic attack (TIA), and cerebral infarction without residual deficits: Secondary | ICD-10-CM | POA: Insufficient documentation

## 2019-08-19 DIAGNOSIS — E039 Hypothyroidism, unspecified: Secondary | ICD-10-CM | POA: Diagnosis not present

## 2019-08-19 DIAGNOSIS — I10 Essential (primary) hypertension: Secondary | ICD-10-CM

## 2019-08-19 DIAGNOSIS — D693 Immune thrombocytopenic purpura: Secondary | ICD-10-CM | POA: Diagnosis not present

## 2019-08-19 DIAGNOSIS — Z79899 Other long term (current) drug therapy: Secondary | ICD-10-CM | POA: Diagnosis not present

## 2019-08-19 DIAGNOSIS — D47Z2 Castleman disease: Secondary | ICD-10-CM

## 2019-08-19 DIAGNOSIS — I129 Hypertensive chronic kidney disease with stage 1 through stage 4 chronic kidney disease, or unspecified chronic kidney disease: Secondary | ICD-10-CM | POA: Diagnosis not present

## 2019-08-19 DIAGNOSIS — I252 Old myocardial infarction: Secondary | ICD-10-CM | POA: Diagnosis not present

## 2019-08-19 LAB — CMP (CANCER CENTER ONLY)
ALT: 23 U/L (ref 0–44)
AST: 26 U/L (ref 15–41)
Albumin: 4.2 g/dL (ref 3.5–5.0)
Alkaline Phosphatase: 85 U/L (ref 38–126)
Anion gap: 9 (ref 5–15)
BUN: 12 mg/dL (ref 8–23)
CO2: 28 mmol/L (ref 22–32)
Calcium: 9 mg/dL (ref 8.9–10.3)
Chloride: 105 mmol/L (ref 98–111)
Creatinine: 0.9 mg/dL (ref 0.44–1.00)
GFR, Est AFR Am: >60 mL/min (ref >60)
GFR, Estimated: >60 mL/min (ref >60)
Glucose, Bld: 121 mg/dL — ABNORMAL HIGH (ref 70–99)
Potassium: 4 mmol/L (ref 3.5–5.1)
Sodium: 142 mmol/L (ref 135–145)
Total Bilirubin: 0.3 mg/dL (ref 0.3–1.2)
Total Protein: 7 g/dL (ref 6.5–8.1)

## 2019-08-19 LAB — CBC WITH DIFFERENTIAL (CANCER CENTER ONLY)
Abs Immature Granulocytes: 0.05 10*3/uL (ref 0.00–0.07)
Basophils Absolute: 0.1 10*3/uL (ref 0.0–0.1)
Basophils Relative: 1 %
Eosinophils Absolute: 0.3 10*3/uL (ref 0.0–0.5)
Eosinophils Relative: 3 %
HCT: 44.4 % (ref 36.0–46.0)
Hemoglobin: 14.2 g/dL (ref 12.0–15.0)
Immature Granulocytes: 1 %
Lymphocytes Relative: 25 %
Lymphs Abs: 2.4 10*3/uL (ref 0.7–4.0)
MCH: 30.2 pg (ref 26.0–34.0)
MCHC: 32 g/dL (ref 30.0–36.0)
MCV: 94.5 fL (ref 80.0–100.0)
Monocytes Absolute: 0.6 10*3/uL (ref 0.1–1.0)
Monocytes Relative: 6 %
Neutro Abs: 6.2 10*3/uL (ref 1.7–7.7)
Neutrophils Relative %: 64 %
Platelet Count: 248 10*3/uL (ref 150–400)
RBC: 4.7 MIL/uL (ref 3.87–5.11)
RDW: 13 % (ref 11.5–15.5)
WBC Count: 9.7 10*3/uL (ref 4.0–10.5)
nRBC: 0 % (ref 0.0–0.2)

## 2019-08-19 LAB — LACTATE DEHYDROGENASE: LDH: 170 U/L (ref 98–192)

## 2019-08-19 NOTE — Progress Notes (Signed)
[place] [phone]   [phone]  OFFICE PROGRESS NOTE  [name], MD [address] [place] [address]  DIAGNOSIS:  1. Castleman's Disease  2. Idiopathic Thrombocytopenic Purpura. 3. 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  2) Weekly rituximab at 375 mg per meter squared with first cycle given in the hospital. Now status post 2 partial cycles and 7 complete cycles.  3) Maintenance Rituxan 375 mg/M2 every 3 months, status post 12 cycles.  CURRENT THERAPY: Observation.  INTERVAL HISTORY: [name] 64 y.o. female returns to the clinic today for follow-up visit.  The patient is feeling fine today with no concerning complaints.  She denied having any palpable lymphadenopathy.  She denied having any chest pain, shortness of breath, cough or hemoptysis.  She denied having any fever or chills.  She has no nausea, vomiting, diarrhea or constipation.  She has no headache or visual changes.  Unfortunately she continues to smoke at regular basis.  She is here today for evaluation and repeat blood work.   MEDICAL HISTORY: Past Medical History:  Diagnosis Date  . Carpal tunnel syndrome   . Castleman's disease ([place])   . Chronic kidney disease    failure  . Heart attack ([place]) 2007  . Heart attack ([place]) 2007  . Hypertension   . Hypothyroidism   . ITP (idiopathic thrombocytopenic purpura) 06/17/2011  . Pulmonary nodule 10/10/2016  . Retroperitoneal lymphadenopathy 2012  . Stroke [facility])     ALLERGIES:  is allergic to cefdinir; levofloxacin; and vicodin [hydrocodone-acetaminophen].  MEDICATIONS:  Current Outpatient Medications  Medication Sig Dispense Refill  . ALPRAZolam (XANAX) 0.25 MG tablet Take 0.5 mg by mouth at bedtime as needed for sleep. For sleep    . clopidogrel (PLAVIX) 75  MG tablet Take 1 tablet (75 mg total) by mouth daily. 90 tablet 1  . levothyroxine (SYNTHROID, LEVOTHROID) 88 MCG tablet Take 88 mcg by mouth daily before breakfast.    . metoprolol succinate (TOPROL-XL) 25 MG 24 hr tablet Take 12.5 mg by mouth daily.     . nicotine (NICODERM CQ - DOSED IN MG/24 HOURS) 21 mg/24hr patch Place 14 mg onto the skin daily.     . pantoprazole (PROTONIX) 20 MG tablet Take 20 mg by mouth daily.    . rosuvastatin (CRESTOR) 40 MG tablet Take 1 tablet (40 mg total) by mouth daily. 90 tablet 1   No current facility-administered medications for this visit.   Facility-Administered Medications Ordered in Other Visits  Medication Dose Route Frequency Provider Last Rate Last Admin  . sodium chloride 0.9 % injection 10 mL  10 mL Intracatheter PRN Curt Bears, MD        SURGICAL HISTORY:  Past Surgical History:  Procedure Laterality Date  . APPENDECTOMY     64 years old  . CESAREAN SECTION  1983 1989    REVIEW OF SYSTEMS:  A comprehensive review of systems was negative.   PHYSICAL EXAMINATION: General appearance: alert, cooperative and no distress Head: Normocephalic, without obvious abnormality, atraumatic Neck: no adenopathy Lymph nodes: Cervical, supraclavicular, and axillary nodes normal. Resp: clear to auscultation bilaterally Back: symmetric, no curvature. ROM normal. No CVA tenderness. Cardio: regular rate and rhythm, S1, S2 normal, no murmur, click, rub or gallop GI: soft, non-tender; bowel sounds normal; no masses,  no organomegaly Extremities: extremities normal, atraumatic,  no cyanosis or edema  ECOG PERFORMANCE STATUS: 1 - Symptomatic but completely ambulatory  Blood pressure 123/69, pulse (!) 57, temperature 98 F (36.7 C), temperature source Temporal, resp. rate 18, height 5\' 3"  (1.6 m), weight 201 lb 6.4 oz (91.4 kg), SpO2 97 %.  LABORATORY DATA: Lab Results  Component Value Date   WBC 9.7 08/19/2019   HGB 14.2 08/19/2019   HCT 44.4  08/19/2019   MCV 94.5 08/19/2019   PLT 248 08/19/2019      Chemistry      Component Value Date/Time   NA 142 08/19/2019 0846   NA 142 04/07/2017 1017   K 4.0 08/19/2019 0846   K 4.5 04/07/2017 1017   CL 105 08/19/2019 0846   CL 106 01/18/2013 0851   CO2 28 08/19/2019 0846   CO2 28 04/07/2017 1017   BUN 12 08/19/2019 0846   BUN 10.4 04/07/2017 1017   CREATININE 0.90 08/19/2019 0846   CREATININE 0.8 04/07/2017 1017      Component Value Date/Time   CALCIUM 9.0 08/19/2019 0846   CALCIUM 9.9 04/07/2017 1017   ALKPHOS 85 08/19/2019 0846   ALKPHOS 116 04/07/2017 1017   AST 26 08/19/2019 0846   AST 18 04/07/2017 1017   ALT 23 08/19/2019 0846   ALT 11 04/07/2017 1017   BILITOT 0.3 08/19/2019 0846   BILITOT 0.38 04/07/2017 1017       RADIOGRAPHIC STUDIES: No results found. ASSESSMENT AND PLAN:  This is a very pleasant 64 years old white female with Castleman disease status post induction treatment with Rituxan followed by maintenance Rituxan for 12 cycles. The patient is currently on observation and she is feeling fine.  Her lab work is unremarkable today. I recommended for her to continue on observation with repeat CT scan of the chest, abdomen pelvis in 6 months. She was advised to quit smoking. She was advised to call immediately if she has any other concerning symptoms in the interval. All questions were answered. The patient knows to call the clinic with any problems, questions or concerns. We can certainly see the patient much sooner if necessary.  Disclaimer: This note was dictated with voice recognition software. Similar sounding words can inadvertently be transcribed and may not be corrected upon review.

## 2019-08-20 ENCOUNTER — Telehealth: Payer: Self-pay | Admitting: Internal Medicine

## 2019-08-20 NOTE — Telephone Encounter (Signed)
Scheduled per los. Called and left msg. Mailed printout  °

## 2019-08-21 DIAGNOSIS — G5601 Carpal tunnel syndrome, right upper limb: Secondary | ICD-10-CM | POA: Diagnosis not present

## 2019-08-22 DIAGNOSIS — G5603 Carpal tunnel syndrome, bilateral upper limbs: Secondary | ICD-10-CM | POA: Diagnosis not present

## 2019-09-25 DIAGNOSIS — R69 Illness, unspecified: Secondary | ICD-10-CM | POA: Diagnosis not present

## 2019-09-25 DIAGNOSIS — E039 Hypothyroidism, unspecified: Secondary | ICD-10-CM | POA: Diagnosis not present

## 2019-09-25 DIAGNOSIS — I2510 Atherosclerotic heart disease of native coronary artery without angina pectoris: Secondary | ICD-10-CM | POA: Diagnosis not present

## 2019-09-25 DIAGNOSIS — I10 Essential (primary) hypertension: Secondary | ICD-10-CM | POA: Diagnosis not present

## 2019-09-25 DIAGNOSIS — E785 Hyperlipidemia, unspecified: Secondary | ICD-10-CM | POA: Diagnosis not present

## 2019-09-25 DIAGNOSIS — I6381 Other cerebral infarction due to occlusion or stenosis of small artery: Secondary | ICD-10-CM | POA: Diagnosis not present

## 2019-09-25 DIAGNOSIS — J309 Allergic rhinitis, unspecified: Secondary | ICD-10-CM | POA: Diagnosis not present

## 2019-10-25 ENCOUNTER — Telehealth: Payer: Self-pay | Admitting: Emergency Medicine

## 2019-10-25 NOTE — Telephone Encounter (Signed)
Returning pt's VM asking if it is ok to receive Covid-19 vaccine with her rare disease process.  No answer, left VM with MD Mohamed's recommendation to get the vaccine when able but try to space it out from tx (the week before or the week after) to give her time to recover.

## 2020-01-10 DIAGNOSIS — I10 Essential (primary) hypertension: Secondary | ICD-10-CM | POA: Diagnosis not present

## 2020-01-10 DIAGNOSIS — R05 Cough: Secondary | ICD-10-CM | POA: Diagnosis not present

## 2020-01-10 DIAGNOSIS — Z720 Tobacco use: Secondary | ICD-10-CM | POA: Diagnosis not present

## 2020-01-10 DIAGNOSIS — J310 Chronic rhinitis: Secondary | ICD-10-CM | POA: Diagnosis not present

## 2020-01-10 DIAGNOSIS — R69 Illness, unspecified: Secondary | ICD-10-CM | POA: Diagnosis not present

## 2020-01-20 DIAGNOSIS — R69 Illness, unspecified: Secondary | ICD-10-CM | POA: Diagnosis not present

## 2020-01-20 DIAGNOSIS — I6381 Other cerebral infarction due to occlusion or stenosis of small artery: Secondary | ICD-10-CM | POA: Diagnosis not present

## 2020-01-20 DIAGNOSIS — J309 Allergic rhinitis, unspecified: Secondary | ICD-10-CM | POA: Diagnosis not present

## 2020-01-20 DIAGNOSIS — I10 Essential (primary) hypertension: Secondary | ICD-10-CM | POA: Diagnosis not present

## 2020-01-20 DIAGNOSIS — I2510 Atherosclerotic heart disease of native coronary artery without angina pectoris: Secondary | ICD-10-CM | POA: Diagnosis not present

## 2020-01-20 DIAGNOSIS — E785 Hyperlipidemia, unspecified: Secondary | ICD-10-CM | POA: Diagnosis not present

## 2020-02-04 ENCOUNTER — Telehealth: Payer: Self-pay | Admitting: Internal Medicine

## 2020-02-04 NOTE — Telephone Encounter (Signed)
Last OV note faxed to [facility] at [phone]  [id number]

## 2020-02-14 ENCOUNTER — Ambulatory Visit (HOSPITAL_COMMUNITY)
Admission: RE | Admit: 2020-02-14 | Discharge: 2020-02-14 | Disposition: A | Discharge status: Home / Self Care | Payer: Medicare HMO | Source: Ambulatory Visit | Attending: Internal Medicine | Admitting: Internal Medicine

## 2020-02-14 ENCOUNTER — Other Ambulatory Visit: Payer: Self-pay

## 2020-02-14 ENCOUNTER — Inpatient Hospital Stay: Payer: Medicare HMO | Attending: Internal Medicine

## 2020-02-14 ENCOUNTER — Encounter (HOSPITAL_COMMUNITY): Payer: Self-pay

## 2020-02-14 DIAGNOSIS — D47Z2 Castleman disease: Secondary | ICD-10-CM

## 2020-02-14 DIAGNOSIS — Z79899 Other long term (current) drug therapy: Secondary | ICD-10-CM | POA: Diagnosis not present

## 2020-02-14 DIAGNOSIS — D693 Immune thrombocytopenic purpura: Secondary | ICD-10-CM | POA: Diagnosis not present

## 2020-02-14 DIAGNOSIS — I700 Atherosclerosis of aorta: Secondary | ICD-10-CM | POA: Diagnosis not present

## 2020-02-14 DIAGNOSIS — J439 Emphysema, unspecified: Secondary | ICD-10-CM | POA: Diagnosis not present

## 2020-02-14 LAB — CBC WITH DIFFERENTIAL (CANCER CENTER ONLY)
Abs Immature Granulocytes: 0.03 10*3/uL (ref 0.00–0.07)
Basophils Absolute: 0.1 10*3/uL (ref 0.0–0.1)
Basophils Relative: 1 %
Eosinophils Absolute: 0.4 10*3/uL (ref 0.0–0.5)
Eosinophils Relative: 5 %
HCT: 44.3 % (ref 36.0–46.0)
Hemoglobin: 14.3 g/dL (ref 12.0–15.0)
Immature Granulocytes: 0 %
Lymphocytes Relative: 30 %
Lymphs Abs: 2.4 10*3/uL (ref 0.7–4.0)
MCH: 31 pg (ref 26.0–34.0)
MCHC: 32.3 g/dL (ref 30.0–36.0)
MCV: 96.1 fL (ref 80.0–100.0)
Monocytes Absolute: 0.5 10*3/uL (ref 0.1–1.0)
Monocytes Relative: 7 %
Neutro Abs: 4.5 10*3/uL (ref 1.7–7.7)
Neutrophils Relative %: 57 %
Platelet Count: 256 10*3/uL (ref 150–400)
RBC: 4.61 MIL/uL (ref 3.87–5.11)
RDW: 12.8 % (ref 11.5–15.5)
WBC Count: 7.9 10*3/uL (ref 4.0–10.5)
nRBC: 0 % (ref 0.0–0.2)

## 2020-02-14 LAB — CMP (CANCER CENTER ONLY)
ALT: 42 U/L (ref 0–44)
AST: 49 U/L — ABNORMAL HIGH (ref 15–41)
Albumin: 4.3 g/dL (ref 3.5–5.0)
Alkaline Phosphatase: 87 U/L (ref 38–126)
Anion gap: 10 (ref 5–15)
BUN: 9 mg/dL (ref 8–23)
CO2: 29 mmol/L (ref 22–32)
Calcium: 9.9 mg/dL (ref 8.9–10.3)
Chloride: 103 mmol/L (ref 98–111)
Creatinine: 0.84 mg/dL (ref 0.44–1.00)
GFR, Est AFR Am: >60 mL/min (ref >60)
GFR, Estimated: >60 mL/min (ref >60)
Glucose, Bld: 96 mg/dL (ref 70–99)
Potassium: 4.4 mmol/L (ref 3.5–5.1)
Sodium: 142 mmol/L (ref 135–145)
Total Bilirubin: 0.4 mg/dL (ref 0.3–1.2)
Total Protein: 7.3 g/dL (ref 6.5–8.1)

## 2020-02-14 LAB — LACTATE DEHYDROGENASE: LDH: 159 U/L (ref 98–192)

## 2020-02-14 MED ORDER — SODIUM CHLORIDE (PF) 0.9 % IJ SOLN
INTRAMUSCULAR | Status: AC
  Filled 2020-02-14: qty 50

## 2020-02-14 MED ORDER — IOHEXOL 300 MG/ML  SOLN
100.0000 mL | Freq: Once | INTRAMUSCULAR | Status: AC | PRN
  Administered 2020-02-14: 100 mL via INTRAVENOUS

## 2020-02-17 ENCOUNTER — Encounter: Payer: Self-pay | Admitting: Internal Medicine

## 2020-02-17 ENCOUNTER — Inpatient Hospital Stay (HOSPITAL_BASED_OUTPATIENT_CLINIC_OR_DEPARTMENT_OTHER): Payer: Medicare HMO | Admitting: Internal Medicine

## 2020-02-17 ENCOUNTER — Other Ambulatory Visit: Payer: Self-pay

## 2020-02-17 VITALS — BP 147/62 | HR 64 | Temp 97.9°F | Resp 18 | Wt 196.5 lb

## 2020-02-17 DIAGNOSIS — D47Z2 Castleman disease: Secondary | ICD-10-CM

## 2020-02-17 DIAGNOSIS — I10 Essential (primary) hypertension: Secondary | ICD-10-CM

## 2020-02-17 DIAGNOSIS — D693 Immune thrombocytopenic purpura: Secondary | ICD-10-CM | POA: Diagnosis not present

## 2020-02-17 DIAGNOSIS — Z79899 Other long term (current) drug therapy: Secondary | ICD-10-CM | POA: Diagnosis not present

## 2020-02-17 NOTE — Progress Notes (Signed)
[place] [phone]   [phone]  OFFICE PROGRESS NOTE  [name], MD [address] [place] [address]  DIAGNOSIS:  1. Castleman's Disease  2. Idiopathic Thrombocytopenic Purpura. 3. 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  2) Weekly rituximab at 375 mg per meter squared with first cycle given in the hospital. Now status post 2 partial cycles and 7 complete cycles.  3) Maintenance Rituxan 375 mg/M2 every 3 months, status post 12 cycles.  CURRENT THERAPY: Observation.  INTERVAL HISTORY: [name] 64 y.o. female returns to the clinic today for 6 months follow-up visit.  The patient is feeling fine today with no concerning complaints.  She denied having any current chest pain, shortness of breath, cough or hemoptysis.  She denied having any fever or chills.  She has no nausea, vomiting, diarrhea or constipation.  She has no headache or visual changes.  She is here today for evaluation with repeat CT scan of the chest, abdomen pelvis for restaging of her disease.   MEDICAL HISTORY: Past Medical History:  Diagnosis Date  . Carpal tunnel syndrome   . Castleman's disease ([place])   . Chronic kidney disease    failure  . Heart attack ([place]) 2007  . Heart attack ([place]) 2007  . Hypertension   . Hypothyroidism   . ITP (idiopathic thrombocytopenic purpura) 06/17/2011  . Pulmonary nodule 10/10/2016  . Retroperitoneal lymphadenopathy 2012  . Stroke [facility])     ALLERGIES:  is allergic to cefdinir, levofloxacin, and vicodin [hydrocodone-acetaminophen].  MEDICATIONS:  Current Outpatient Medications  Medication Sig Dispense Refill  . ALPRAZolam (XANAX) 0.25 MG tablet Take 0.5 mg by mouth at bedtime as needed for sleep. For sleep    . clopidogrel (PLAVIX) 75 MG tablet Take 1 tablet (75 mg  total) by mouth daily. 90 tablet 1  . levothyroxine (SYNTHROID, LEVOTHROID) 88 MCG tablet Take 88 mcg by mouth daily before breakfast.    . metoprolol succinate (TOPROL-XL) 25 MG 24 hr tablet Take 12.5 mg by mouth daily.     . nicotine (NICODERM CQ - DOSED IN MG/24 HOURS) 21 mg/24hr patch Place 14 mg onto the skin daily.     . pantoprazole (PROTONIX) 20 MG tablet Take 20 mg by mouth daily.    . rosuvastatin (CRESTOR) 40 MG tablet Take 1 tablet (40 mg total) by mouth daily. 90 tablet 1   No current facility-administered medications for this visit.   Facility-Administered Medications Ordered in Other Visits  Medication Dose Route Frequency Provider Last Rate Last Admin  . sodium chloride 0.9 % injection 10 mL  10 mL Intracatheter PRN Curt Bears, MD        SURGICAL HISTORY:  Past Surgical History:  Procedure Laterality Date  . APPENDECTOMY     64 years old  . CESAREAN SECTION  1983 1989    REVIEW OF SYSTEMS:  A comprehensive review of systems was negative.   PHYSICAL EXAMINATION: General appearance: alert, cooperative and no distress Head: Normocephalic, without obvious abnormality, atraumatic Neck: no adenopathy Lymph nodes: Cervical, supraclavicular, and axillary nodes normal. Resp: clear to auscultation bilaterally Back: symmetric, no curvature. ROM normal. No CVA tenderness. Cardio: regular rate and rhythm, S1, S2 normal, no murmur, click, rub or gallop GI: soft, non-tender; bowel sounds normal; no masses,  no organomegaly Extremities: extremities normal, atraumatic, no cyanosis or  edema  ECOG PERFORMANCE STATUS: 1 - Symptomatic but completely ambulatory  Blood pressure (!) 147/62, pulse 64, temperature 97.9 F (36.6 C), temperature source Temporal, resp. rate 18, weight 196 lb 8 oz (89.1 kg), SpO2 98 %.  LABORATORY DATA: Lab Results  Component Value Date   WBC 7.9 02/14/2020   HGB 14.3 02/14/2020   HCT 44.3 02/14/2020   MCV 96.1 02/14/2020   PLT 256 02/14/2020       Chemistry      Component Value Date/Time   NA 142 02/14/2020 0921   NA 142 04/07/2017 1017   K 4.4 02/14/2020 0921   K 4.5 04/07/2017 1017   CL 103 02/14/2020 0921   CL 106 01/18/2013 0851   CO2 29 02/14/2020 0921   CO2 28 04/07/2017 1017   BUN 9 02/14/2020 0921   BUN 10.4 04/07/2017 1017   CREATININE 0.84 02/14/2020 0921   CREATININE 0.8 04/07/2017 1017      Component Value Date/Time   CALCIUM 9.9 02/14/2020 0921   CALCIUM 9.9 04/07/2017 1017   ALKPHOS 87 02/14/2020 0921   ALKPHOS 116 04/07/2017 1017   AST 49 (H) 02/14/2020 0921   AST 18 04/07/2017 1017   ALT 42 02/14/2020 0921   ALT 11 04/07/2017 1017   BILITOT 0.4 02/14/2020 0921   BILITOT 0.38 04/07/2017 1017       RADIOGRAPHIC STUDIES: CT Chest W Contrast  Result Date: 02/14/2020 CLINICAL DATA:  Primary Cancer Type: Castleman's disease Imaging Indication: Routine surveillance Interval therapy since last imaging? No Initial Cancer Diagnosis Date: 06/03/2011; Established by: Biopsy-proven Detailed Pathology: Castleman's disease. Lymphadenopathy consistent with angiofollicular lymphoid hyperplasia. Surgeries: Appendectomy. Chemotherapy: No Immunotherapy?  Yes; Type: Rituxan; Ongoing? No Radiation therapy? No FOLLOW UP:  CT CHEST, ABDOMEN, AND PELVIS WITH CONTRAST TECHNIQUE: Multidetector CT imaging of the chest, abdomen and pelvis was performed following the standard protocol during bolus administration of intravenous contrast. CONTRAST:  125mL OMNIPAQUE IOHEXOL 300 MG/ML  SOLN COMPARISON:  Most recent CT chest, abdomen and pelvis 02/13/2019. FINDINGS: CT CHEST FINDINGS Cardiovascular: The heart size is normal. No substantial pericardial effusion. Coronary artery calcification is evident. Atherosclerotic calcification is noted in the wall of the thoracic aorta. Mediastinum/Nodes: No mediastinal lymphadenopathy. There is no hilar lymphadenopathy. The esophagus has normal imaging features. Prominent axillary and subpectoral  lymph nodes again noted bilaterally. Index 10 mm left subpectoral lymph node (9/2) is unchanged. Index 12 mm short axis left axillary node on 13/2 was 10 mm short axis previously. Posterior left axillary node on 15/2 is 8 mm today, stable compared to 8 mm previously. Lungs/Pleura: Centrilobular emphsyema noted. No suspicious pulmonary nodule or mass. No focal airspace consolidation. No pleural effusion. Musculoskeletal: No worrisome lytic or sclerotic osseous abnormality. CT ABDOMEN PELVIS FINDINGS Hepatobiliary: No suspicious focal abnormality within the liver parenchyma. Multiple calcified gallstones evident. No intrahepatic or extrahepatic biliary dilation. Pancreas: No focal mass lesion. No dilatation of the main duct. No intraparenchymal cyst. No peripancreatic edema. Spleen: No splenomegaly. No focal mass lesion. Adrenals/Urinary Tract: No adrenal nodule or mass. Right kidney unremarkable. Tiny hypoattenuating lesion lower pole left kidney is similar to prior and while too small to characterize is likely benign. No evidence for hydroureter. The urinary bladder appears normal for the degree of distention. Stomach/Bowel: Stomach is unremarkable. No gastric wall thickening. No evidence of outlet obstruction. Duodenum is normally positioned as is the ligament of Treitz. No small bowel wall thickening. No small bowel dilatation. The terminal ileum is normal. The appendix is not  visualized, but there is no edema or inflammation in the region of the cecum. No gross colonic mass. No colonic wall thickening. Diverticular changes are noted in the left colon without evidence of diverticulitis. Vascular/Lymphatic: There is abdominal aortic atherosclerosis without aneurysm. 7 mm short axis gastrohepatic ligament lymph node on 56/2 is unchanged. Preaortic node is 5 mm short axis today (66/2) compared to 4 mm previously. Left para-aortic index node is now 11 mm short axis (71/2) compared to 13 mm previously. Left common  iliac node seen to be mildly increased on the previous study is 8 mm today (82/2), decreased from 11 mm previously. Reproductive: Unremarkable. Other: No intraperitoneal free fluid. Musculoskeletal: No worrisome lytic or sclerotic osseous abnormality. IMPRESSION: 1. Stable exam. Upper normal and mildly enlarged lymph nodes of the chest, abdomen, and pelvis are stable in the interval with no generalized trend towards improvement or progression. 2. Cholelithiasis. 3.  Emphysema (ICD10-J43.9) and Aortic Atherosclerosis (ICD10-170.0) Electronically Signed   By: Misty Stanley M.D.   On: 02/14/2020 11:33   CT Abdomen Pelvis W Contrast  Result Date: 02/14/2020 CLINICAL DATA:  Primary Cancer Type: Castleman's disease Imaging Indication: Routine surveillance Interval therapy since last imaging? No Initial Cancer Diagnosis Date: 06/03/2011; Established by: Biopsy-proven Detailed Pathology: Castleman's disease. Lymphadenopathy consistent with angiofollicular lymphoid hyperplasia. Surgeries: Appendectomy. Chemotherapy: No Immunotherapy?  Yes; Type: Rituxan; Ongoing? No Radiation therapy? No FOLLOW UP:  CT CHEST, ABDOMEN, AND PELVIS WITH CONTRAST TECHNIQUE: Multidetector CT imaging of the chest, abdomen and pelvis was performed following the standard protocol during bolus administration of intravenous contrast. CONTRAST:  164mL OMNIPAQUE IOHEXOL 300 MG/ML  SOLN COMPARISON:  Most recent CT chest, abdomen and pelvis 02/13/2019. FINDINGS: CT CHEST FINDINGS Cardiovascular: The heart size is normal. No substantial pericardial effusion. Coronary artery calcification is evident. Atherosclerotic calcification is noted in the wall of the thoracic aorta. Mediastinum/Nodes: No mediastinal lymphadenopathy. There is no hilar lymphadenopathy. The esophagus has normal imaging features. Prominent axillary and subpectoral lymph nodes again noted bilaterally. Index 10 mm left subpectoral lymph node (9/2) is unchanged. Index 12 mm short axis  left axillary node on 13/2 was 10 mm short axis previously. Posterior left axillary node on 15/2 is 8 mm today, stable compared to 8 mm previously. Lungs/Pleura: Centrilobular emphsyema noted. No suspicious pulmonary nodule or mass. No focal airspace consolidation. No pleural effusion. Musculoskeletal: No worrisome lytic or sclerotic osseous abnormality. CT ABDOMEN PELVIS FINDINGS Hepatobiliary: No suspicious focal abnormality within the liver parenchyma. Multiple calcified gallstones evident. No intrahepatic or extrahepatic biliary dilation. Pancreas: No focal mass lesion. No dilatation of the main duct. No intraparenchymal cyst. No peripancreatic edema. Spleen: No splenomegaly. No focal mass lesion. Adrenals/Urinary Tract: No adrenal nodule or mass. Right kidney unremarkable. Tiny hypoattenuating lesion lower pole left kidney is similar to prior and while too small to characterize is likely benign. No evidence for hydroureter. The urinary bladder appears normal for the degree of distention. Stomach/Bowel: Stomach is unremarkable. No gastric wall thickening. No evidence of outlet obstruction. Duodenum is normally positioned as is the ligament of Treitz. No small bowel wall thickening. No small bowel dilatation. The terminal ileum is normal. The appendix is not visualized, but there is no edema or inflammation in the region of the cecum. No gross colonic mass. No colonic wall thickening. Diverticular changes are noted in the left colon without evidence of diverticulitis. Vascular/Lymphatic: There is abdominal aortic atherosclerosis without aneurysm. 7 mm short axis gastrohepatic ligament lymph node on 56/2 is unchanged. Preaortic node is  5 mm short axis today (66/2) compared to 4 mm previously. Left para-aortic index node is now 11 mm short axis (71/2) compared to 13 mm previously. Left common iliac node seen to be mildly increased on the previous study is 8 mm today (82/2), decreased from 11 mm previously.  Reproductive: Unremarkable. Other: No intraperitoneal free fluid. Musculoskeletal: No worrisome lytic or sclerotic osseous abnormality. IMPRESSION: 1. Stable exam. Upper normal and mildly enlarged lymph nodes of the chest, abdomen, and pelvis are stable in the interval with no generalized trend towards improvement or progression. 2. Cholelithiasis. 3.  Emphysema (ICD10-J43.9) and Aortic Atherosclerosis (ICD10-170.0) Electronically Signed   By: Misty Stanley M.D.   On: 02/14/2020 11:33   ASSESSMENT AND PLAN:  This is a very pleasant 65 years old white female with Castleman disease status post induction treatment with Rituxan followed by maintenance Rituxan for 12 cycles. The patient is currently on observation and she is feeling fine with no concerning complaints. She had repeat CT scan of the chest, abdomen pelvis performed recently.  I personally and independently reviewed the scans and discussed the results with the patient today. Her scan showed no concerning findings for disease recurrence or progression. I recommended for her to continue on observation. I will see her back for follow-up visit in 1 year with repeat CT scan of the chest, abdomen pelvis for restaging of her disease. The patient was advised to call immediately if she has any concerning symptoms in the interval. All questions were answered. The patient knows to call the clinic with any problems, questions or concerns. We can certainly see the patient much sooner if necessary.  Disclaimer: This note was dictated with voice recognition software. Similar sounding words can inadvertently be transcribed and may not be corrected upon review.

## 2020-05-25 DIAGNOSIS — R6883 Chills (without fever): Secondary | ICD-10-CM | POA: Diagnosis not present

## 2020-05-25 DIAGNOSIS — Z1152 Encounter for screening for COVID-19: Secondary | ICD-10-CM | POA: Diagnosis not present

## 2020-05-25 DIAGNOSIS — J0100 Acute maxillary sinusitis, unspecified: Secondary | ICD-10-CM | POA: Diagnosis not present

## 2020-06-01 DIAGNOSIS — R69 Illness, unspecified: Secondary | ICD-10-CM | POA: Diagnosis not present

## 2020-06-01 DIAGNOSIS — I6381 Other cerebral infarction due to occlusion or stenosis of small artery: Secondary | ICD-10-CM | POA: Diagnosis not present

## 2020-06-01 DIAGNOSIS — I10 Essential (primary) hypertension: Secondary | ICD-10-CM | POA: Diagnosis not present

## 2020-06-01 DIAGNOSIS — E785 Hyperlipidemia, unspecified: Secondary | ICD-10-CM | POA: Diagnosis not present

## 2020-06-01 DIAGNOSIS — I2510 Atherosclerotic heart disease of native coronary artery without angina pectoris: Secondary | ICD-10-CM | POA: Diagnosis not present

## 2020-06-09 DIAGNOSIS — E559 Vitamin D deficiency, unspecified: Secondary | ICD-10-CM | POA: Diagnosis not present

## 2020-06-09 DIAGNOSIS — E785 Hyperlipidemia, unspecified: Secondary | ICD-10-CM | POA: Diagnosis not present

## 2020-06-09 DIAGNOSIS — N179 Acute kidney failure, unspecified: Secondary | ICD-10-CM | POA: Diagnosis not present

## 2020-06-09 DIAGNOSIS — E039 Hypothyroidism, unspecified: Secondary | ICD-10-CM | POA: Diagnosis not present

## 2020-06-11 DIAGNOSIS — G5603 Carpal tunnel syndrome, bilateral upper limbs: Secondary | ICD-10-CM | POA: Diagnosis not present

## 2020-06-16 DIAGNOSIS — Z0000 Encounter for general adult medical examination without abnormal findings: Secondary | ICD-10-CM | POA: Diagnosis not present

## 2020-06-16 DIAGNOSIS — E559 Vitamin D deficiency, unspecified: Secondary | ICD-10-CM | POA: Diagnosis not present

## 2020-06-16 DIAGNOSIS — G4700 Insomnia, unspecified: Secondary | ICD-10-CM | POA: Diagnosis not present

## 2020-06-16 DIAGNOSIS — E039 Hypothyroidism, unspecified: Secondary | ICD-10-CM | POA: Diagnosis not present

## 2020-06-16 DIAGNOSIS — Z23 Encounter for immunization: Secondary | ICD-10-CM | POA: Diagnosis not present

## 2020-06-16 DIAGNOSIS — R82998 Other abnormal findings in urine: Secondary | ICD-10-CM | POA: Diagnosis not present

## 2020-06-16 DIAGNOSIS — J432 Centrilobular emphysema: Secondary | ICD-10-CM | POA: Diagnosis not present

## 2020-06-16 DIAGNOSIS — E785 Hyperlipidemia, unspecified: Secondary | ICD-10-CM | POA: Diagnosis not present

## 2020-06-16 DIAGNOSIS — I10 Essential (primary) hypertension: Secondary | ICD-10-CM | POA: Diagnosis not present

## 2020-06-16 DIAGNOSIS — D47Z2 Castleman disease: Secondary | ICD-10-CM | POA: Diagnosis not present

## 2020-06-16 DIAGNOSIS — I2510 Atherosclerotic heart disease of native coronary artery without angina pectoris: Secondary | ICD-10-CM | POA: Diagnosis not present

## 2020-06-19 DIAGNOSIS — G5611 Other lesions of median nerve, right upper limb: Secondary | ICD-10-CM | POA: Diagnosis not present

## 2020-06-19 DIAGNOSIS — G8918 Other acute postprocedural pain: Secondary | ICD-10-CM | POA: Diagnosis not present

## 2020-07-06 DIAGNOSIS — Z1212 Encounter for screening for malignant neoplasm of rectum: Secondary | ICD-10-CM | POA: Diagnosis not present

## 2020-07-07 ENCOUNTER — Other Ambulatory Visit: Payer: Self-pay | Admitting: Internal Medicine

## 2020-07-07 DIAGNOSIS — Z1231 Encounter for screening mammogram for malignant neoplasm of breast: Secondary | ICD-10-CM

## 2020-07-07 DIAGNOSIS — Z0000 Encounter for general adult medical examination without abnormal findings: Secondary | ICD-10-CM

## 2020-08-05 DIAGNOSIS — E039 Hypothyroidism, unspecified: Secondary | ICD-10-CM | POA: Diagnosis not present

## 2020-08-05 DIAGNOSIS — R82998 Other abnormal findings in urine: Secondary | ICD-10-CM | POA: Diagnosis not present

## 2020-09-30 DIAGNOSIS — E039 Hypothyroidism, unspecified: Secondary | ICD-10-CM | POA: Diagnosis not present

## 2020-10-05 DIAGNOSIS — J309 Allergic rhinitis, unspecified: Secondary | ICD-10-CM | POA: Diagnosis not present

## 2020-10-05 DIAGNOSIS — I10 Essential (primary) hypertension: Secondary | ICD-10-CM | POA: Diagnosis not present

## 2020-10-05 DIAGNOSIS — I2510 Atherosclerotic heart disease of native coronary artery without angina pectoris: Secondary | ICD-10-CM | POA: Diagnosis not present

## 2020-10-05 DIAGNOSIS — E785 Hyperlipidemia, unspecified: Secondary | ICD-10-CM | POA: Diagnosis not present

## 2020-12-30 DIAGNOSIS — H6121 Impacted cerumen, right ear: Secondary | ICD-10-CM | POA: Diagnosis not present

## 2020-12-30 DIAGNOSIS — H9191 Unspecified hearing loss, right ear: Secondary | ICD-10-CM | POA: Diagnosis not present

## 2021-02-05 ENCOUNTER — Telehealth: Payer: Self-pay | Admitting: Medical Oncology

## 2021-02-05 NOTE — Telephone Encounter (Signed)
Barium contrast left up front for pt and pt notified.

## 2021-02-15 ENCOUNTER — Inpatient Hospital Stay: Payer: Medicare HMO | Attending: Internal Medicine

## 2021-02-15 ENCOUNTER — Ambulatory Visit (HOSPITAL_COMMUNITY)
Admission: RE | Admit: 2021-02-15 | Discharge: 2021-02-15 | Disposition: A | Discharge status: Home / Self Care | Payer: Medicare HMO | Source: Ambulatory Visit | Attending: Internal Medicine | Admitting: Internal Medicine

## 2021-02-15 ENCOUNTER — Encounter (HOSPITAL_COMMUNITY): Payer: Self-pay

## 2021-02-15 ENCOUNTER — Other Ambulatory Visit: Payer: Self-pay

## 2021-02-15 DIAGNOSIS — Z79899 Other long term (current) drug therapy: Secondary | ICD-10-CM | POA: Diagnosis not present

## 2021-02-15 DIAGNOSIS — K5730 Diverticulosis of large intestine without perforation or abscess without bleeding: Secondary | ICD-10-CM | POA: Diagnosis not present

## 2021-02-15 DIAGNOSIS — N189 Chronic kidney disease, unspecified: Secondary | ICD-10-CM | POA: Diagnosis not present

## 2021-02-15 DIAGNOSIS — D693 Immune thrombocytopenic purpura: Secondary | ICD-10-CM | POA: Diagnosis not present

## 2021-02-15 DIAGNOSIS — K8020 Calculus of gallbladder without cholecystitis without obstruction: Secondary | ICD-10-CM | POA: Diagnosis not present

## 2021-02-15 DIAGNOSIS — D47Z2 Castleman disease: Secondary | ICD-10-CM | POA: Diagnosis not present

## 2021-02-15 DIAGNOSIS — I700 Atherosclerosis of aorta: Secondary | ICD-10-CM | POA: Diagnosis not present

## 2021-02-15 DIAGNOSIS — E039 Hypothyroidism, unspecified: Secondary | ICD-10-CM | POA: Diagnosis not present

## 2021-02-15 DIAGNOSIS — I129 Hypertensive chronic kidney disease with stage 1 through stage 4 chronic kidney disease, or unspecified chronic kidney disease: Secondary | ICD-10-CM | POA: Insufficient documentation

## 2021-02-15 DIAGNOSIS — J439 Emphysema, unspecified: Secondary | ICD-10-CM | POA: Diagnosis not present

## 2021-02-15 LAB — CBC WITH DIFFERENTIAL (CANCER CENTER ONLY)
Abs Immature Granulocytes: 0.02 10*3/uL (ref 0.00–0.07)
Basophils Absolute: 0.1 10*3/uL (ref 0.0–0.1)
Basophils Relative: 1 %
Eosinophils Absolute: 0.3 10*3/uL (ref 0.0–0.5)
Eosinophils Relative: 3 %
HCT: 43.4 % (ref 36.0–46.0)
Hemoglobin: 14.4 g/dL (ref 12.0–15.0)
Immature Granulocytes: 0 %
Lymphocytes Relative: 29 %
Lymphs Abs: 2.4 10*3/uL (ref 0.7–4.0)
MCH: 30.8 pg (ref 26.0–34.0)
MCHC: 33.2 g/dL (ref 30.0–36.0)
MCV: 92.9 fL (ref 80.0–100.0)
Monocytes Absolute: 0.6 10*3/uL (ref 0.1–1.0)
Monocytes Relative: 7 %
Neutro Abs: 4.9 10*3/uL (ref 1.7–7.7)
Neutrophils Relative %: 60 %
Platelet Count: 244 10*3/uL (ref 150–400)
RBC: 4.67 MIL/uL (ref 3.87–5.11)
RDW: 12.3 % (ref 11.5–15.5)
WBC Count: 8.2 10*3/uL (ref 4.0–10.5)
nRBC: 0 % (ref 0.0–0.2)

## 2021-02-15 LAB — CMP (CANCER CENTER ONLY)
ALT: 28 U/L (ref 0–44)
AST: 27 U/L (ref 15–41)
Albumin: 4.1 g/dL (ref 3.5–5.0)
Alkaline Phosphatase: 94 U/L (ref 38–126)
Anion gap: 7 (ref 5–15)
BUN: 13 mg/dL (ref 8–23)
CO2: 30 mmol/L (ref 22–32)
Calcium: 9.5 mg/dL (ref 8.9–10.3)
Chloride: 105 mmol/L (ref 98–111)
Creatinine: 0.75 mg/dL (ref 0.44–1.00)
GFR, Estimated: >60 mL/min (ref >60)
Glucose, Bld: 88 mg/dL (ref 70–99)
Potassium: 4.3 mmol/L (ref 3.5–5.1)
Sodium: 142 mmol/L (ref 135–145)
Total Bilirubin: 0.4 mg/dL (ref 0.3–1.2)
Total Protein: 7 g/dL (ref 6.5–8.1)

## 2021-02-15 LAB — LACTATE DEHYDROGENASE: LDH: 130 U/L (ref 98–192)

## 2021-02-15 MED ORDER — IOHEXOL 350 MG/ML SOLN
100.0000 mL | Freq: Once | INTRAVENOUS | Status: AC | PRN
  Administered 2021-02-15: 80 mL via INTRAVENOUS

## 2021-02-16 ENCOUNTER — Inpatient Hospital Stay (HOSPITAL_BASED_OUTPATIENT_CLINIC_OR_DEPARTMENT_OTHER): Payer: Medicare HMO | Admitting: Internal Medicine

## 2021-02-16 ENCOUNTER — Other Ambulatory Visit: Payer: Self-pay

## 2021-02-16 VITALS — BP 146/75 | HR 70 | Temp 97.7°F | Resp 20 | Ht 63.0 in | Wt 172.6 lb

## 2021-02-16 DIAGNOSIS — I129 Hypertensive chronic kidney disease with stage 1 through stage 4 chronic kidney disease, or unspecified chronic kidney disease: Secondary | ICD-10-CM | POA: Diagnosis not present

## 2021-02-16 DIAGNOSIS — Z79899 Other long term (current) drug therapy: Secondary | ICD-10-CM | POA: Diagnosis not present

## 2021-02-16 DIAGNOSIS — R599 Enlarged lymph nodes, unspecified: Secondary | ICD-10-CM | POA: Diagnosis not present

## 2021-02-16 DIAGNOSIS — D47Z2 Castleman disease: Secondary | ICD-10-CM | POA: Diagnosis not present

## 2021-02-16 DIAGNOSIS — N189 Chronic kidney disease, unspecified: Secondary | ICD-10-CM | POA: Diagnosis not present

## 2021-02-16 DIAGNOSIS — E039 Hypothyroidism, unspecified: Secondary | ICD-10-CM | POA: Diagnosis not present

## 2021-02-16 DIAGNOSIS — R590 Localized enlarged lymph nodes: Secondary | ICD-10-CM | POA: Diagnosis not present

## 2021-02-16 DIAGNOSIS — D693 Immune thrombocytopenic purpura: Secondary | ICD-10-CM | POA: Diagnosis not present

## 2021-02-16 NOTE — Progress Notes (Signed)
[place] [phone]   [phone]  OFFICE PROGRESS NOTE  Erin Battles, MD [address] [place] [address]  DIAGNOSIS:  1. Castleman's Disease  2. Idiopathic Thrombocytopenic Purpura. 3. 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  2) Weekly rituximab at 375 mg per meter squared with first cycle given in the hospital. Now status post 2 partial cycles and 7 complete cycles.  3) Maintenance Rituxan 375 mg/M2 every 3 months, status post 12 cycles.  CURRENT THERAPY: Observation.  INTERVAL HISTORY: [name] 65 y.o. female returns to the clinic today for annual follow-up visit.  The patient is feeling fine today with no concerning complaints except for occasional shortness of breath with exertion.  She denied having any current chest pain, cough or hemoptysis.  She denied having any recent weight loss or night sweats.  She has no palpable lymphadenopathy.  She has no bleeding issues.  She has no nausea, vomiting, diarrhea or constipation.  The patient is currently on observation.  She had repeat CT scan of the chest, abdomen pelvis performed yesterday and she is here for evaluation and discussion of her scan results.    MEDICAL HISTORY: Past Medical History:  Diagnosis Date   Carpal tunnel syndrome    Castleman's disease ([place])    Chronic kidney disease    failure   Heart attack ([place]) 2007   Heart attack ([place]) 2007   Hypertension    Hypothyroidism    ITP (idiopathic thrombocytopenic purpura) 06/17/2011   Pulmonary nodule 10/10/2016   Retroperitoneal lymphadenopathy 2012   Stroke [facility])     ALLERGIES:  is allergic to cefdinir, levofloxacin, and vicodin [hydrocodone-acetaminophen].  MEDICATIONS:  Current Outpatient Medications  Medication Sig Dispense Refill   ALPRAZolam (XANAX)  0.25 MG tablet Take 0.5 mg by mouth at bedtime as needed for sleep. For sleep     clopidogrel (PLAVIX) 75 MG tablet Take 1 tablet (75 mg total) by mouth daily. 90 tablet 1   levothyroxine (SYNTHROID) 112 MCG tablet Take 112 mcg by mouth every morning.     metoprolol succinate (TOPROL-XL) 25 MG 24 hr tablet Take 12.5 mg by mouth daily.      nicotine (NICODERM CQ - DOSED IN MG/24 HOURS) 21 mg/24hr patch Place 14 mg onto the skin daily.      pantoprazole (PROTONIX) 20 MG tablet Take 20 mg by mouth daily.     rosuvastatin (CRESTOR) 40 MG tablet Take 1 tablet (40 mg total) by mouth daily. 90 tablet 1   levothyroxine (SYNTHROID, LEVOTHROID) 88 MCG tablet Take 88 mcg by mouth daily before breakfast.     No current facility-administered medications for this visit.   Facility-Administered Medications Ordered in Other Visits  Medication Dose Route Frequency Provider Last Rate Last Admin   sodium chloride 0.9 % injection 10 mL  10 mL Intracatheter PRN Curt Bears, MD        SURGICAL HISTORY:  Past Surgical History:  Procedure Laterality Date   APPENDECTOMY     65 years old   [place]    REVIEW OF SYSTEMS:  A comprehensive review of systems was negative except for: Respiratory: positive for dyspnea on exertion   PHYSICAL EXAMINATION: General appearance: alert, cooperative, and no distress Head: Normocephalic, without obvious abnormality, atraumatic Neck: no adenopathy Lymph nodes: Cervical, supraclavicular, and axillary nodes  normal. Resp: clear to auscultation bilaterally Back: symmetric, no curvature. ROM normal. No CVA tenderness. Cardio: regular rate and rhythm, S1, S2 normal, no murmur, click, rub or gallop GI: soft, non-tender; bowel sounds normal; no masses,  no organomegaly Extremities: extremities normal, atraumatic, no cyanosis or edema  ECOG PERFORMANCE STATUS: 1 - Symptomatic but completely ambulatory  Blood pressure (!) 146/75, pulse 70, temperature 97.7  F (36.5 C), temperature source Tympanic, resp. rate 20, height 5\' 3"  (1.6 m), weight 172 lb 9.6 oz (78.3 kg), SpO2 98 %.  LABORATORY DATA: Lab Results  Component Value Date   WBC 8.2 02/15/2021   HGB 14.4 02/15/2021   HCT 43.4 02/15/2021   MCV 92.9 02/15/2021   PLT 244 02/15/2021      Chemistry      Component Value Date/Time   NA 142 02/15/2021 1001   NA 142 04/07/2017 1017   K 4.3 02/15/2021 1001   K 4.5 04/07/2017 1017   CL 105 02/15/2021 1001   CL 106 01/18/2013 0851   CO2 30 02/15/2021 1001   CO2 28 04/07/2017 1017   BUN 13 02/15/2021 1001   BUN 10.4 04/07/2017 1017   CREATININE 0.75 02/15/2021 1001   CREATININE 0.8 04/07/2017 1017      Component Value Date/Time   CALCIUM 9.5 02/15/2021 1001   CALCIUM 9.9 04/07/2017 1017   ALKPHOS 94 02/15/2021 1001   ALKPHOS 116 04/07/2017 1017   AST 27 02/15/2021 1001   AST 18 04/07/2017 1017   ALT 28 02/15/2021 1001   ALT 11 04/07/2017 1017   BILITOT 0.4 02/15/2021 1001   BILITOT 0.38 04/07/2017 1017       RADIOGRAPHIC STUDIES: No results found.  ASSESSMENT AND PLAN:  This is a very pleasant 65 years old white female with Castleman disease status post induction treatment with Rituxan followed by maintenance Rituxan for 12 cycles. The patient has been in observation for several years and she is doing fine with no concerning complaints. She had repeat CT scan of the chest, abdomen pelvis performed recently.  The report is still pending but I personally and independently reviewed the scan and I do not see any concerning findings for disease progression except for mild increase in left axillary lymphadenopathy. If the final report is not concerning for any aggressive disease progression, I will see her back for follow-up visit in 1 year otherwise the patient will be seen sooner for further recommendation. She was advised to call immediately if she has any other concerning symptoms in the interval. All questions were answered.  The patient knows to call the clinic with any problems, questions or concerns. We can certainly see the patient much sooner if necessary.  Disclaimer: This note was dictated with voice recognition software. Similar sounding words can inadvertently be transcribed and may not be corrected upon review.

## 2021-02-19 ENCOUNTER — Telehealth: Payer: Self-pay | Admitting: Internal Medicine

## 2021-02-19 NOTE — Telephone Encounter (Signed)
Scheduled per los. Called and left msg. Mailed printout  °

## 2021-03-01 DIAGNOSIS — I10 Essential (primary) hypertension: Secondary | ICD-10-CM | POA: Diagnosis not present

## 2021-03-01 DIAGNOSIS — R69 Illness, unspecified: Secondary | ICD-10-CM | POA: Diagnosis not present

## 2021-03-01 DIAGNOSIS — E785 Hyperlipidemia, unspecified: Secondary | ICD-10-CM | POA: Diagnosis not present

## 2021-03-01 DIAGNOSIS — I2510 Atherosclerotic heart disease of native coronary artery without angina pectoris: Secondary | ICD-10-CM | POA: Diagnosis not present

## 2021-03-01 DIAGNOSIS — E039 Hypothyroidism, unspecified: Secondary | ICD-10-CM | POA: Diagnosis not present

## 2021-05-14 ENCOUNTER — Telehealth: Payer: Self-pay | Admitting: Medical Oncology

## 2021-05-14 NOTE — Telephone Encounter (Signed)
Copy of July CT mailed to pt.

## 2021-05-29 DIAGNOSIS — Z23 Encounter for immunization: Secondary | ICD-10-CM | POA: Diagnosis not present

## 2021-06-10 DIAGNOSIS — Z9889 Other specified postprocedural states: Secondary | ICD-10-CM | POA: Diagnosis not present

## 2021-06-10 DIAGNOSIS — R2231 Localized swelling, mass and lump, right upper limb: Secondary | ICD-10-CM | POA: Diagnosis not present

## 2021-06-16 DIAGNOSIS — G5601 Carpal tunnel syndrome, right upper limb: Secondary | ICD-10-CM | POA: Diagnosis not present

## 2021-06-29 DIAGNOSIS — G5601 Carpal tunnel syndrome, right upper limb: Secondary | ICD-10-CM | POA: Diagnosis not present

## 2021-07-05 DIAGNOSIS — E785 Hyperlipidemia, unspecified: Secondary | ICD-10-CM | POA: Diagnosis not present

## 2021-07-05 DIAGNOSIS — I10 Essential (primary) hypertension: Secondary | ICD-10-CM | POA: Diagnosis not present

## 2021-07-05 DIAGNOSIS — I2510 Atherosclerotic heart disease of native coronary artery without angina pectoris: Secondary | ICD-10-CM | POA: Diagnosis not present

## 2021-07-05 DIAGNOSIS — R69 Illness, unspecified: Secondary | ICD-10-CM | POA: Diagnosis not present

## 2021-07-27 DIAGNOSIS — E785 Hyperlipidemia, unspecified: Secondary | ICD-10-CM | POA: Diagnosis not present

## 2021-07-27 DIAGNOSIS — E559 Vitamin D deficiency, unspecified: Secondary | ICD-10-CM | POA: Diagnosis not present

## 2021-07-27 DIAGNOSIS — E039 Hypothyroidism, unspecified: Secondary | ICD-10-CM | POA: Diagnosis not present

## 2021-07-27 DIAGNOSIS — R7301 Impaired fasting glucose: Secondary | ICD-10-CM | POA: Diagnosis not present

## 2021-07-27 DIAGNOSIS — I10 Essential (primary) hypertension: Secondary | ICD-10-CM | POA: Diagnosis not present

## 2021-07-29 DIAGNOSIS — E785 Hyperlipidemia, unspecified: Secondary | ICD-10-CM | POA: Diagnosis not present

## 2021-08-03 DIAGNOSIS — Z1212 Encounter for screening for malignant neoplasm of rectum: Secondary | ICD-10-CM | POA: Diagnosis not present

## 2021-08-03 DIAGNOSIS — I739 Peripheral vascular disease, unspecified: Secondary | ICD-10-CM | POA: Diagnosis not present

## 2021-08-03 DIAGNOSIS — Z0000 Encounter for general adult medical examination without abnormal findings: Secondary | ICD-10-CM | POA: Diagnosis not present

## 2021-08-03 DIAGNOSIS — Z720 Tobacco use: Secondary | ICD-10-CM | POA: Diagnosis not present

## 2021-08-03 DIAGNOSIS — J432 Centrilobular emphysema: Secondary | ICD-10-CM | POA: Diagnosis not present

## 2021-08-03 DIAGNOSIS — I69951 Hemiplegia and hemiparesis following unspecified cerebrovascular disease affecting right dominant side: Secondary | ICD-10-CM | POA: Diagnosis not present

## 2021-08-03 DIAGNOSIS — R7301 Impaired fasting glucose: Secondary | ICD-10-CM | POA: Diagnosis not present

## 2021-08-03 DIAGNOSIS — R82998 Other abnormal findings in urine: Secondary | ICD-10-CM | POA: Diagnosis not present

## 2021-08-03 DIAGNOSIS — E785 Hyperlipidemia, unspecified: Secondary | ICD-10-CM | POA: Diagnosis not present

## 2021-08-03 DIAGNOSIS — E039 Hypothyroidism, unspecified: Secondary | ICD-10-CM | POA: Diagnosis not present

## 2021-08-03 DIAGNOSIS — I10 Essential (primary) hypertension: Secondary | ICD-10-CM | POA: Diagnosis not present

## 2021-08-03 DIAGNOSIS — G4700 Insomnia, unspecified: Secondary | ICD-10-CM | POA: Diagnosis not present

## 2021-08-03 DIAGNOSIS — Z1339 Encounter for screening examination for other mental health and behavioral disorders: Secondary | ICD-10-CM | POA: Diagnosis not present

## 2021-08-03 DIAGNOSIS — I2510 Atherosclerotic heart disease of native coronary artery without angina pectoris: Secondary | ICD-10-CM | POA: Diagnosis not present

## 2021-08-03 DIAGNOSIS — E559 Vitamin D deficiency, unspecified: Secondary | ICD-10-CM | POA: Diagnosis not present

## 2021-08-03 DIAGNOSIS — Z1331 Encounter for screening for depression: Secondary | ICD-10-CM | POA: Diagnosis not present

## 2021-08-03 DIAGNOSIS — D47Z2 Castleman disease: Secondary | ICD-10-CM | POA: Diagnosis not present

## 2021-08-10 DIAGNOSIS — G5601 Carpal tunnel syndrome, right upper limb: Secondary | ICD-10-CM | POA: Diagnosis not present

## 2021-10-27 DIAGNOSIS — R051 Acute cough: Secondary | ICD-10-CM | POA: Diagnosis not present

## 2021-10-27 DIAGNOSIS — R69 Illness, unspecified: Secondary | ICD-10-CM | POA: Diagnosis not present

## 2021-10-27 DIAGNOSIS — R0981 Nasal congestion: Secondary | ICD-10-CM | POA: Diagnosis not present

## 2021-10-27 DIAGNOSIS — Z1152 Encounter for screening for COVID-19: Secondary | ICD-10-CM | POA: Diagnosis not present

## 2021-10-27 DIAGNOSIS — J029 Acute pharyngitis, unspecified: Secondary | ICD-10-CM | POA: Diagnosis not present

## 2021-10-27 DIAGNOSIS — R5383 Other fatigue: Secondary | ICD-10-CM | POA: Diagnosis not present

## 2021-10-27 DIAGNOSIS — J310 Chronic rhinitis: Secondary | ICD-10-CM | POA: Diagnosis not present

## 2021-11-29 DIAGNOSIS — R69 Illness, unspecified: Secondary | ICD-10-CM | POA: Diagnosis not present

## 2021-11-29 DIAGNOSIS — I2510 Atherosclerotic heart disease of native coronary artery without angina pectoris: Secondary | ICD-10-CM | POA: Diagnosis not present

## 2021-11-29 DIAGNOSIS — E785 Hyperlipidemia, unspecified: Secondary | ICD-10-CM | POA: Diagnosis not present

## 2021-11-29 DIAGNOSIS — E039 Hypothyroidism, unspecified: Secondary | ICD-10-CM | POA: Diagnosis not present

## 2021-11-29 DIAGNOSIS — I10 Essential (primary) hypertension: Secondary | ICD-10-CM | POA: Diagnosis not present

## 2021-12-02 ENCOUNTER — Other Ambulatory Visit: Payer: Self-pay | Admitting: Internal Medicine

## 2021-12-02 DIAGNOSIS — Z1231 Encounter for screening mammogram for malignant neoplasm of breast: Secondary | ICD-10-CM

## 2021-12-03 ENCOUNTER — Ambulatory Visit
Admission: RE | Admit: 2021-12-03 | Discharge: 2021-12-03 | Disposition: A | Discharge status: Home / Self Care | Payer: Medicare HMO | Source: Ambulatory Visit | Attending: Internal Medicine | Admitting: Internal Medicine

## 2021-12-03 DIAGNOSIS — Z1231 Encounter for screening mammogram for malignant neoplasm of breast: Secondary | ICD-10-CM | POA: Diagnosis not present

## 2021-12-07 ENCOUNTER — Other Ambulatory Visit: Payer: Self-pay | Admitting: Internal Medicine

## 2021-12-07 DIAGNOSIS — R928 Other abnormal and inconclusive findings on diagnostic imaging of breast: Secondary | ICD-10-CM

## 2021-12-13 ENCOUNTER — Ambulatory Visit
Admission: RE | Admit: 2021-12-13 | Discharge: 2021-12-13 | Disposition: A | Discharge status: Home / Self Care | Payer: Medicare HMO | Source: Ambulatory Visit | Attending: Internal Medicine | Admitting: Internal Medicine

## 2021-12-13 ENCOUNTER — Ambulatory Visit: Payer: Medicare HMO

## 2021-12-13 DIAGNOSIS — R928 Other abnormal and inconclusive findings on diagnostic imaging of breast: Secondary | ICD-10-CM

## 2021-12-13 DIAGNOSIS — N6489 Other specified disorders of breast: Secondary | ICD-10-CM | POA: Diagnosis not present

## 2022-01-04 ENCOUNTER — Telehealth: Payer: Self-pay | Admitting: Medical Oncology

## 2022-01-04 NOTE — Telephone Encounter (Signed)
Pt cancelled her July appts for CT and f/u.  She wants to move her appts to August 28.  Schedule message sent .  Expected dates moved on orders for CT and labs. Pt notified.

## 2022-01-05 ENCOUNTER — Telehealth: Payer: Self-pay

## 2022-01-05 NOTE — Telephone Encounter (Signed)
Pt called stating that she called central scheduling and was unable to schedule CT scans that had been moved to August. Left VM for  patient advising  that pt should call central scheduling the week of 01/10/22 to schedule. Encouraged her to call back with questions or concerns.

## 2022-02-11 ENCOUNTER — Ambulatory Visit (HOSPITAL_COMMUNITY): Payer: Medicare HMO

## 2022-02-11 ENCOUNTER — Other Ambulatory Visit: Payer: Medicare HMO

## 2022-02-14 ENCOUNTER — Ambulatory Visit: Payer: Medicare HMO | Admitting: Internal Medicine

## 2022-03-28 ENCOUNTER — Other Ambulatory Visit: Payer: Self-pay

## 2022-03-28 ENCOUNTER — Ambulatory Visit (HOSPITAL_COMMUNITY)
Admission: RE | Admit: 2022-03-28 | Discharge: 2022-03-28 | Disposition: A | Discharge status: Home / Self Care | Payer: Medicare HMO | Source: Ambulatory Visit | Attending: Internal Medicine | Admitting: Internal Medicine

## 2022-03-28 ENCOUNTER — Inpatient Hospital Stay: Payer: Medicare HMO | Attending: Internal Medicine

## 2022-03-28 ENCOUNTER — Other Ambulatory Visit: Payer: Medicare HMO

## 2022-03-28 ENCOUNTER — Encounter (HOSPITAL_COMMUNITY): Payer: Self-pay

## 2022-03-28 DIAGNOSIS — K8020 Calculus of gallbladder without cholecystitis without obstruction: Secondary | ICD-10-CM | POA: Diagnosis not present

## 2022-03-28 DIAGNOSIS — Z8673 Personal history of transient ischemic attack (TIA), and cerebral infarction without residual deficits: Secondary | ICD-10-CM | POA: Diagnosis not present

## 2022-03-28 DIAGNOSIS — D693 Immune thrombocytopenic purpura: Secondary | ICD-10-CM | POA: Insufficient documentation

## 2022-03-28 DIAGNOSIS — I252 Old myocardial infarction: Secondary | ICD-10-CM | POA: Diagnosis not present

## 2022-03-28 DIAGNOSIS — Z885 Allergy status to narcotic agent status: Secondary | ICD-10-CM | POA: Diagnosis not present

## 2022-03-28 DIAGNOSIS — R591 Generalized enlarged lymph nodes: Secondary | ICD-10-CM | POA: Diagnosis not present

## 2022-03-28 DIAGNOSIS — Z7902 Long term (current) use of antithrombotics/antiplatelets: Secondary | ICD-10-CM | POA: Diagnosis not present

## 2022-03-28 DIAGNOSIS — Z9049 Acquired absence of other specified parts of digestive tract: Secondary | ICD-10-CM | POA: Diagnosis not present

## 2022-03-28 DIAGNOSIS — Z881 Allergy status to other antibiotic agents status: Secondary | ICD-10-CM | POA: Diagnosis not present

## 2022-03-28 DIAGNOSIS — I700 Atherosclerosis of aorta: Secondary | ICD-10-CM | POA: Diagnosis not present

## 2022-03-28 DIAGNOSIS — K5730 Diverticulosis of large intestine without perforation or abscess without bleeding: Secondary | ICD-10-CM | POA: Insufficient documentation

## 2022-03-28 DIAGNOSIS — R599 Enlarged lymph nodes, unspecified: Secondary | ICD-10-CM

## 2022-03-28 DIAGNOSIS — Z79890 Hormone replacement therapy (postmenopausal): Secondary | ICD-10-CM | POA: Diagnosis not present

## 2022-03-28 DIAGNOSIS — Z79899 Other long term (current) drug therapy: Secondary | ICD-10-CM | POA: Diagnosis not present

## 2022-03-28 DIAGNOSIS — D47Z2 Castleman disease: Secondary | ICD-10-CM | POA: Diagnosis not present

## 2022-03-28 DIAGNOSIS — R590 Localized enlarged lymph nodes: Secondary | ICD-10-CM | POA: Insufficient documentation

## 2022-03-28 LAB — CBC WITH DIFFERENTIAL (CANCER CENTER ONLY)
Abs Immature Granulocytes: 0.03 10*3/uL (ref 0.00–0.07)
Basophils Absolute: 0.1 10*3/uL (ref 0.0–0.1)
Basophils Relative: 1 %
Eosinophils Absolute: 0.2 10*3/uL (ref 0.0–0.5)
Eosinophils Relative: 2 %
HCT: 44.3 % (ref 36.0–46.0)
Hemoglobin: 15.2 g/dL — ABNORMAL HIGH (ref 12.0–15.0)
Immature Granulocytes: 0 %
Lymphocytes Relative: 33 %
Lymphs Abs: 2.8 10*3/uL (ref 0.7–4.0)
MCH: 31.8 pg (ref 26.0–34.0)
MCHC: 34.3 g/dL (ref 30.0–36.0)
MCV: 92.7 fL (ref 80.0–100.0)
Monocytes Absolute: 0.5 10*3/uL (ref 0.1–1.0)
Monocytes Relative: 6 %
Neutro Abs: 4.8 10*3/uL (ref 1.7–7.7)
Neutrophils Relative %: 58 %
Platelet Count: 249 10*3/uL (ref 150–400)
RBC: 4.78 MIL/uL (ref 3.87–5.11)
RDW: 12.2 % (ref 11.5–15.5)
WBC Count: 8.3 10*3/uL (ref 4.0–10.5)
nRBC: 0 % (ref 0.0–0.2)

## 2022-03-28 LAB — CMP (CANCER CENTER ONLY)
ALT: 13 U/L (ref 0–44)
AST: 18 U/L (ref 15–41)
Albumin: 4.9 g/dL (ref 3.5–5.0)
Alkaline Phosphatase: 81 U/L (ref 38–126)
Anion gap: 4 — ABNORMAL LOW (ref 5–15)
BUN: 12 mg/dL (ref 8–23)
CO2: 33 mmol/L — ABNORMAL HIGH (ref 22–32)
Calcium: 9.9 mg/dL (ref 8.9–10.3)
Chloride: 104 mmol/L (ref 98–111)
Creatinine: 0.69 mg/dL (ref 0.44–1.00)
GFR, Estimated: >60 mL/min (ref >60)
Glucose, Bld: 105 mg/dL — ABNORMAL HIGH (ref 70–99)
Potassium: 4.6 mmol/L (ref 3.5–5.1)
Sodium: 141 mmol/L (ref 135–145)
Total Bilirubin: 0.4 mg/dL (ref 0.3–1.2)
Total Protein: 7.2 g/dL (ref 6.5–8.1)

## 2022-03-28 LAB — LACTATE DEHYDROGENASE: LDH: 139 U/L (ref 98–192)

## 2022-03-28 MED ORDER — SODIUM CHLORIDE (PF) 0.9 % IJ SOLN
INTRAMUSCULAR | Status: AC
  Filled 2022-03-28: qty 50

## 2022-03-28 MED ORDER — IOHEXOL 300 MG/ML  SOLN
100.0000 mL | Freq: Once | INTRAMUSCULAR | Status: AC | PRN
  Administered 2022-03-28: 100 mL via INTRAVENOUS

## 2022-03-31 ENCOUNTER — Ambulatory Visit: Payer: Medicare HMO | Admitting: Internal Medicine

## 2022-03-31 ENCOUNTER — Other Ambulatory Visit: Payer: Self-pay

## 2022-03-31 ENCOUNTER — Inpatient Hospital Stay (HOSPITAL_BASED_OUTPATIENT_CLINIC_OR_DEPARTMENT_OTHER): Payer: Medicare HMO | Admitting: Internal Medicine

## 2022-03-31 VITALS — BP 149/90 | HR 72 | Temp 97.8°F | Resp 15 | Wt 181.6 lb

## 2022-03-31 DIAGNOSIS — I700 Atherosclerosis of aorta: Secondary | ICD-10-CM | POA: Diagnosis not present

## 2022-03-31 DIAGNOSIS — Z7902 Long term (current) use of antithrombotics/antiplatelets: Secondary | ICD-10-CM | POA: Diagnosis not present

## 2022-03-31 DIAGNOSIS — Z885 Allergy status to narcotic agent status: Secondary | ICD-10-CM | POA: Diagnosis not present

## 2022-03-31 DIAGNOSIS — Z79899 Other long term (current) drug therapy: Secondary | ICD-10-CM | POA: Diagnosis not present

## 2022-03-31 DIAGNOSIS — I252 Old myocardial infarction: Secondary | ICD-10-CM | POA: Diagnosis not present

## 2022-03-31 DIAGNOSIS — D693 Immune thrombocytopenic purpura: Secondary | ICD-10-CM | POA: Diagnosis not present

## 2022-03-31 DIAGNOSIS — Z9049 Acquired absence of other specified parts of digestive tract: Secondary | ICD-10-CM | POA: Diagnosis not present

## 2022-03-31 DIAGNOSIS — D47Z2 Castleman disease: Secondary | ICD-10-CM | POA: Diagnosis not present

## 2022-03-31 DIAGNOSIS — R591 Generalized enlarged lymph nodes: Secondary | ICD-10-CM | POA: Diagnosis not present

## 2022-03-31 DIAGNOSIS — K5730 Diverticulosis of large intestine without perforation or abscess without bleeding: Secondary | ICD-10-CM | POA: Diagnosis not present

## 2022-03-31 DIAGNOSIS — Z79890 Hormone replacement therapy (postmenopausal): Secondary | ICD-10-CM | POA: Diagnosis not present

## 2022-03-31 DIAGNOSIS — K8020 Calculus of gallbladder without cholecystitis without obstruction: Secondary | ICD-10-CM | POA: Diagnosis not present

## 2022-03-31 DIAGNOSIS — Z881 Allergy status to other antibiotic agents status: Secondary | ICD-10-CM | POA: Diagnosis not present

## 2022-03-31 DIAGNOSIS — Z8673 Personal history of transient ischemic attack (TIA), and cerebral infarction without residual deficits: Secondary | ICD-10-CM | POA: Diagnosis not present

## 2022-03-31 NOTE — Progress Notes (Signed)
[name] Telephone:(336) [phone]   Fax:(336) [phone]  OFFICE PROGRESS NOTE  Erin Lopes, MD [address] [place] [address]  DIAGNOSIS:  1. Castleman's Disease  2. Idiopathic Thrombocytopenic Purpura. 3. 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  2) Weekly rituximab at 375 mg per meter squared with first cycle given in the hospital. Now status post 2 partial cycles and 7 complete cycles.  3) Maintenance Rituxan 375 mg/M2 every 3 months, status post 12 cycles.  CURRENT THERAPY: Observation.  INTERVAL HISTORY: [name] 66 y.o. female returns to the clinic today for annual follow-up visit.  The patient is feeling fine today with no concerning complaints.  She denied having any current chest pain, shortness of breath, cough or hemoptysis.  She denied having any nausea, vomiting, diarrhea or constipation.  She has no headache or visual changes.  She has no palpable lymphadenopathy.  She has no bleeding, bruises or ecchymosis.  She is here today for evaluation with repeat CT scan of the chest, abdomen and pelvis for restaging of her disease.   MEDICAL HISTORY: Past Medical History:  Diagnosis Date   Carpal tunnel syndrome    Castleman's disease ([place])    Chronic kidney disease    failure   Heart attack ([place]) 2007   Heart attack ([place]) 2007   Hypertension    Hypothyroidism    ITP (idiopathic thrombocytopenic purpura) 06/17/2011   Pulmonary nodule 10/10/2016   Retroperitoneal lymphadenopathy 2012   Stroke [facility])     ALLERGIES:  is allergic to cefdinir, levofloxacin, and vicodin [hydrocodone-acetaminophen].  MEDICATIONS:  Current Outpatient Medications  Medication Sig Dispense Refill   ALPRAZolam (XANAX) 0.25 MG tablet Take 0.5 mg by mouth at bedtime as needed for sleep. For sleep     clopidogrel  (PLAVIX) 75 MG tablet Take 1 tablet (75 mg total) by mouth daily. 90 tablet 1   levothyroxine (SYNTHROID) 112 MCG tablet Take 112 mcg by mouth every morning.     levothyroxine (SYNTHROID, LEVOTHROID) 88 MCG tablet Take 88 mcg by mouth daily before breakfast.     metoprolol succinate (TOPROL-XL) 25 MG 24 hr tablet Take 12.5 mg by mouth daily.      nicotine (NICODERM CQ - DOSED IN MG/24 HOURS) 21 mg/24hr patch Place 14 mg onto the skin daily.      pantoprazole (PROTONIX) 20 MG tablet Take 20 mg by mouth daily.     rosuvastatin (CRESTOR) 40 MG tablet Take 1 tablet (40 mg total) by mouth daily. 90 tablet 1   No current facility-administered medications for this visit.   Facility-Administered Medications Ordered in Other Visits  Medication Dose Route Frequency Provider Last Rate Last Admin   sodium chloride 0.9 % injection 10 mL  10 mL Intracatheter PRN Erin Bears, MD        SURGICAL HISTORY:  Past Surgical History:  Procedure Laterality Date   APPENDECTOMY     66 years old   [place]    REVIEW OF SYSTEMS:  A comprehensive review of systems was negative.   PHYSICAL EXAMINATION: General appearance: alert, cooperative, and no distress Head: Normocephalic, without obvious abnormality, atraumatic Neck: no adenopathy Lymph nodes: Cervical, supraclavicular, and axillary nodes normal. Resp: clear to auscultation bilaterally Back: symmetric, no curvature. ROM normal. No CVA tenderness. Cardio: regular rate and rhythm, S1, S2 normal, no  murmur, click, rub or gallop GI: soft, non-tender; bowel sounds normal; no masses,  no organomegaly Extremities: extremities normal, atraumatic, no cyanosis or edema  ECOG PERFORMANCE STATUS: 0 - Asymptomatic  Blood pressure (!) 149/90, pulse 72, temperature 97.8 F (36.6 C), temperature source Oral, resp. rate 15, weight 181 lb 9.6 oz (82.4 kg), SpO2 98 %.  LABORATORY DATA: Lab Results  Component Value Date   WBC 8.3 03/28/2022    HGB 15.2 (H) 03/28/2022   HCT 44.3 03/28/2022   MCV 92.7 03/28/2022   PLT 249 03/28/2022      Chemistry      Component Value Date/Time   NA 141 03/28/2022 1132   NA 142 04/07/2017 1017   K 4.6 03/28/2022 1132   K 4.5 04/07/2017 1017   CL 104 03/28/2022 1132   CL 106 01/18/2013 0851   CO2 33 (H) 03/28/2022 1132   CO2 28 04/07/2017 1017   BUN 12 03/28/2022 1132   BUN 10.4 04/07/2017 1017   CREATININE 0.69 03/28/2022 1132   CREATININE 0.8 04/07/2017 1017      Component Value Date/Time   CALCIUM 9.9 03/28/2022 1132   CALCIUM 9.9 04/07/2017 1017   ALKPHOS 81 03/28/2022 1132   ALKPHOS 116 04/07/2017 1017   AST 18 03/28/2022 1132   AST 18 04/07/2017 1017   ALT 13 03/28/2022 1132   ALT 11 04/07/2017 1017   BILITOT 0.4 03/28/2022 1132   BILITOT 0.38 04/07/2017 1017       RADIOGRAPHIC STUDIES: CT Chest W Contrast  Result Date: 03/28/2022 CLINICAL DATA:  Left axillary lymphadenopathy.  Castleman's disease. EXAM: CT CHEST, ABDOMEN, AND PELVIS WITH CONTRAST TECHNIQUE: Multidetector CT imaging of the chest, abdomen and pelvis was performed following the standard protocol during bolus administration of intravenous contrast. RADIATION DOSE REDUCTION: This exam was performed according to the departmental dose-optimization program which includes automated exposure control, adjustment of the mA and/or kV according to patient size and/or use of iterative reconstruction technique. CONTRAST:  161m OMNIPAQUE IOHEXOL 300 MG/ML  SOLN COMPARISON:  02/15/2021 FINDINGS: CT CHEST FINDINGS Cardiovascular: No acute findings. Aortic and coronary atherosclerotic calcification incidentally noted. Mediastinum/Lymph Nodes: No mediastinal or hilar lymphadenopathy identified. Mild left axillary and subpectoral lymphadenopathy remains stable, with index lymph node in the left axilla measuring 11 mm on image 100/5 5. Mild right subpectoral lymphadenopathy is also unchanged with largest lymph node measuring 8 mm  on image 68/5 of 5. No new or increased sites of lymphadenopathy identified. Lungs/Pleura: No suspicious pulmonary nodules or masses identified. No evidence of infiltrate or pleural effusion. Musculoskeletal:  No suspicious bone lesions identified. CT ABDOMEN AND PELVIS FINDINGS Hepatobiliary: No masses identified. Multiple small calcified gallstones are again seen, however there is no evidence of cholecystitis or biliary ductal dilatation. Pancreas:  No mass or inflammatory changes. Spleen:  Within normal limits in size and appearance. Adrenals/Urinary tract: No suspicious masses or hydronephrosis. Stomach/Bowel: No evidence of obstruction, inflammatory process, or abnormal fluid collections. Diverticulosis is seen mainly involving the descending and sigmoid colon, however there is no evidence of diverticulitis. Vascular/Lymphatic: Mild lymphadenopathy in left para-aortic region is stable, with largest lymph node measuring 11 mm on image 70/504. A few sub-cm left common iliac lymph nodes are seen, however no pathologically enlarged lymph nodes are identified within the pelvis. No acute vascular findings. Aortic atherosclerotic calcification incidentally noted. Reproductive:  No mass or other significant abnormality identified. Other:  None. Musculoskeletal:  No suspicious bone lesions identified. IMPRESSION: Stable mild bilateral axillary and left  subpectoral lymphadenopathy. Stable mild abdominal retroperitoneal lymphadenopathy. No new or progressive lymphadenopathy identified. Cholelithiasis. No radiographic evidence of cholecystitis. Colonic diverticulosis, without radiographic evidence of diverticulitis. Aortic Atherosclerosis (ICD10-I70.0). Electronically Signed   By: Marlaine Hind M.D.   On: 03/28/2022 15:36   CT Abdomen Pelvis W Contrast  Result Date: 03/28/2022 CLINICAL DATA:  Left axillary lymphadenopathy.  Castleman's disease. EXAM: CT CHEST, ABDOMEN, AND PELVIS WITH CONTRAST TECHNIQUE: Multidetector  CT imaging of the chest, abdomen and pelvis was performed following the standard protocol during bolus administration of intravenous contrast. RADIATION DOSE REDUCTION: This exam was performed according to the departmental dose-optimization program which includes automated exposure control, adjustment of the mA and/or kV according to patient size and/or use of iterative reconstruction technique. CONTRAST:  168m OMNIPAQUE IOHEXOL 300 MG/ML  SOLN COMPARISON:  02/15/2021 FINDINGS: CT CHEST FINDINGS Cardiovascular: No acute findings. Aortic and coronary atherosclerotic calcification incidentally noted. Mediastinum/Lymph Nodes: No mediastinal or hilar lymphadenopathy identified. Mild left axillary and subpectoral lymphadenopathy remains stable, with index lymph node in the left axilla measuring 11 mm on image 100/5 5. Mild right subpectoral lymphadenopathy is also unchanged with largest lymph node measuring 8 mm on image 68/5 of 5. No new or increased sites of lymphadenopathy identified. Lungs/Pleura: No suspicious pulmonary nodules or masses identified. No evidence of infiltrate or pleural effusion. Musculoskeletal:  No suspicious bone lesions identified. CT ABDOMEN AND PELVIS FINDINGS Hepatobiliary: No masses identified. Multiple small calcified gallstones are again seen, however there is no evidence of cholecystitis or biliary ductal dilatation. Pancreas:  No mass or inflammatory changes. Spleen:  Within normal limits in size and appearance. Adrenals/Urinary tract: No suspicious masses or hydronephrosis. Stomach/Bowel: No evidence of obstruction, inflammatory process, or abnormal fluid collections. Diverticulosis is seen mainly involving the descending and sigmoid colon, however there is no evidence of diverticulitis. Vascular/Lymphatic: Mild lymphadenopathy in left para-aortic region is stable, with largest lymph node measuring 11 mm on image 70/504. A few sub-cm left common iliac lymph nodes are seen, however no  pathologically enlarged lymph nodes are identified within the pelvis. No acute vascular findings. Aortic atherosclerotic calcification incidentally noted. Reproductive:  No mass or other significant abnormality identified. Other:  None. Musculoskeletal:  No suspicious bone lesions identified. IMPRESSION: Stable mild bilateral axillary and left subpectoral lymphadenopathy. Stable mild abdominal retroperitoneal lymphadenopathy. No new or progressive lymphadenopathy identified. Cholelithiasis. No radiographic evidence of cholecystitis. Colonic diverticulosis, without radiographic evidence of diverticulitis. Aortic Atherosclerosis (ICD10-I70.0). Electronically Signed   By: JMarlaine HindM.D.   On: 03/28/2022 15:36    ASSESSMENT AND PLAN:  This is a very pleasant 66years old white female with Castleman disease status post induction treatment with Rituxan followed by maintenance Rituxan for 12 cycles. The patient has been on observation now for several years with no concerning complaints or recurrence of her disease. She had repeat CT scan of the chest, abdomen and pelvis that showed a stable mild bilateral axillary and left subpectoral as well as mild abdominal retroperitoneal lymphadenopathy. I recommended for the patient to continue on observation with repeat lab work as well as CT scan of the chest, abdomen and pelvis in 1 year. She was advised to call immediately if she has any other concerning symptoms in the interval. All questions were answered. The patient knows to call the clinic with any problems, questions or concerns. We can certainly see the patient much sooner if necessary.  Disclaimer: This note was dictated with voice recognition software. Similar sounding words can inadvertently be transcribed and  may not be corrected upon review.

## 2022-03-31 NOTE — Addendum Note (Signed)
Addended by: Ardeen Garland on: 03/31/2022 12:43 PM   Modules accepted: Orders

## 2022-04-11 DIAGNOSIS — E039 Hypothyroidism, unspecified: Secondary | ICD-10-CM | POA: Diagnosis not present

## 2022-04-11 DIAGNOSIS — I2510 Atherosclerotic heart disease of native coronary artery without angina pectoris: Secondary | ICD-10-CM | POA: Diagnosis not present

## 2022-04-11 DIAGNOSIS — E785 Hyperlipidemia, unspecified: Secondary | ICD-10-CM | POA: Diagnosis not present

## 2022-04-11 DIAGNOSIS — I10 Essential (primary) hypertension: Secondary | ICD-10-CM | POA: Diagnosis not present

## 2022-04-20 DIAGNOSIS — J019 Acute sinusitis, unspecified: Secondary | ICD-10-CM | POA: Diagnosis not present

## 2022-04-20 DIAGNOSIS — R253 Fasciculation: Secondary | ICD-10-CM | POA: Diagnosis not present

## 2022-05-20 ENCOUNTER — Other Ambulatory Visit: Payer: Self-pay | Admitting: Internal Medicine

## 2022-05-20 DIAGNOSIS — J310 Chronic rhinitis: Secondary | ICD-10-CM

## 2022-05-23 ENCOUNTER — Ambulatory Visit
Admission: RE | Admit: 2022-05-23 | Discharge: 2022-05-23 | Disposition: A | Discharge status: Home / Self Care | Payer: Medicare HMO | Source: Ambulatory Visit | Attending: Internal Medicine | Admitting: Internal Medicine

## 2022-05-23 DIAGNOSIS — J321 Chronic frontal sinusitis: Secondary | ICD-10-CM | POA: Diagnosis not present

## 2022-05-23 DIAGNOSIS — J310 Chronic rhinitis: Secondary | ICD-10-CM

## 2022-05-23 DIAGNOSIS — J342 Deviated nasal septum: Secondary | ICD-10-CM | POA: Diagnosis not present

## 2022-05-23 DIAGNOSIS — J3489 Other specified disorders of nose and nasal sinuses: Secondary | ICD-10-CM | POA: Diagnosis not present

## 2022-05-23 DIAGNOSIS — R253 Fasciculation: Secondary | ICD-10-CM | POA: Diagnosis not present

## 2022-06-01 DIAGNOSIS — Z23 Encounter for immunization: Secondary | ICD-10-CM | POA: Diagnosis not present

## 2022-07-12 DIAGNOSIS — Z7901 Long term (current) use of anticoagulants: Secondary | ICD-10-CM | POA: Diagnosis not present

## 2022-07-12 DIAGNOSIS — J343 Hypertrophy of nasal turbinates: Secondary | ICD-10-CM | POA: Diagnosis not present

## 2022-07-12 DIAGNOSIS — Z8673 Personal history of transient ischemic attack (TIA), and cerebral infarction without residual deficits: Secondary | ICD-10-CM | POA: Diagnosis not present

## 2022-07-12 DIAGNOSIS — G245 Blepharospasm: Secondary | ICD-10-CM | POA: Diagnosis not present

## 2022-07-12 DIAGNOSIS — J3489 Other specified disorders of nose and nasal sinuses: Secondary | ICD-10-CM | POA: Diagnosis not present

## 2022-07-12 DIAGNOSIS — J342 Deviated nasal septum: Secondary | ICD-10-CM | POA: Diagnosis not present

## 2022-08-02 DIAGNOSIS — H6121 Impacted cerumen, right ear: Secondary | ICD-10-CM | POA: Diagnosis not present

## 2022-08-02 DIAGNOSIS — H9191 Unspecified hearing loss, right ear: Secondary | ICD-10-CM | POA: Diagnosis not present

## 2022-08-15 DIAGNOSIS — E785 Hyperlipidemia, unspecified: Secondary | ICD-10-CM | POA: Diagnosis not present

## 2022-08-15 DIAGNOSIS — I2510 Atherosclerotic heart disease of native coronary artery without angina pectoris: Secondary | ICD-10-CM | POA: Diagnosis not present

## 2022-08-15 DIAGNOSIS — I10 Essential (primary) hypertension: Secondary | ICD-10-CM | POA: Diagnosis not present

## 2022-08-15 DIAGNOSIS — E039 Hypothyroidism, unspecified: Secondary | ICD-10-CM | POA: Diagnosis not present

## 2022-09-15 DIAGNOSIS — E785 Hyperlipidemia, unspecified: Secondary | ICD-10-CM | POA: Diagnosis not present

## 2022-09-15 DIAGNOSIS — E039 Hypothyroidism, unspecified: Secondary | ICD-10-CM | POA: Diagnosis not present

## 2022-09-15 DIAGNOSIS — R7301 Impaired fasting glucose: Secondary | ICD-10-CM | POA: Diagnosis not present

## 2022-09-15 DIAGNOSIS — E559 Vitamin D deficiency, unspecified: Secondary | ICD-10-CM | POA: Diagnosis not present

## 2022-09-15 DIAGNOSIS — R7989 Other specified abnormal findings of blood chemistry: Secondary | ICD-10-CM | POA: Diagnosis not present

## 2022-09-15 DIAGNOSIS — I10 Essential (primary) hypertension: Secondary | ICD-10-CM | POA: Diagnosis not present

## 2022-09-21 DIAGNOSIS — Z1212 Encounter for screening for malignant neoplasm of rectum: Secondary | ICD-10-CM | POA: Diagnosis not present

## 2022-09-22 DIAGNOSIS — E039 Hypothyroidism, unspecified: Secondary | ICD-10-CM | POA: Diagnosis not present

## 2022-09-22 DIAGNOSIS — R7301 Impaired fasting glucose: Secondary | ICD-10-CM | POA: Diagnosis not present

## 2022-09-22 DIAGNOSIS — I10 Essential (primary) hypertension: Secondary | ICD-10-CM | POA: Diagnosis not present

## 2022-09-22 DIAGNOSIS — Z0000 Encounter for general adult medical examination without abnormal findings: Secondary | ICD-10-CM | POA: Diagnosis not present

## 2022-09-22 DIAGNOSIS — J432 Centrilobular emphysema: Secondary | ICD-10-CM | POA: Diagnosis not present

## 2022-09-22 DIAGNOSIS — D47Z2 Castleman disease: Secondary | ICD-10-CM | POA: Diagnosis not present

## 2022-09-22 DIAGNOSIS — E785 Hyperlipidemia, unspecified: Secondary | ICD-10-CM | POA: Diagnosis not present

## 2022-09-22 DIAGNOSIS — R82998 Other abnormal findings in urine: Secondary | ICD-10-CM | POA: Diagnosis not present

## 2022-09-22 DIAGNOSIS — E559 Vitamin D deficiency, unspecified: Secondary | ICD-10-CM | POA: Diagnosis not present

## 2022-09-22 DIAGNOSIS — Z1331 Encounter for screening for depression: Secondary | ICD-10-CM | POA: Diagnosis not present

## 2022-09-22 DIAGNOSIS — F17219 Nicotine dependence, cigarettes, with unspecified nicotine-induced disorders: Secondary | ICD-10-CM | POA: Diagnosis not present

## 2022-09-22 DIAGNOSIS — J310 Chronic rhinitis: Secondary | ICD-10-CM | POA: Diagnosis not present

## 2022-09-22 DIAGNOSIS — I2510 Atherosclerotic heart disease of native coronary artery without angina pectoris: Secondary | ICD-10-CM | POA: Diagnosis not present

## 2022-09-22 DIAGNOSIS — I739 Peripheral vascular disease, unspecified: Secondary | ICD-10-CM | POA: Diagnosis not present

## 2022-09-22 DIAGNOSIS — Z1339 Encounter for screening examination for other mental health and behavioral disorders: Secondary | ICD-10-CM | POA: Diagnosis not present

## 2022-11-09 ENCOUNTER — Other Ambulatory Visit: Payer: Self-pay | Admitting: Internal Medicine

## 2022-11-09 DIAGNOSIS — Z1231 Encounter for screening mammogram for malignant neoplasm of breast: Secondary | ICD-10-CM

## 2022-12-22 ENCOUNTER — Ambulatory Visit
Admission: RE | Admit: 2022-12-22 | Discharge: 2022-12-22 | Disposition: A | Discharge status: Home / Self Care | Payer: Medicare HMO | Source: Ambulatory Visit | Attending: Internal Medicine | Admitting: Internal Medicine

## 2022-12-22 DIAGNOSIS — Z1231 Encounter for screening mammogram for malignant neoplasm of breast: Secondary | ICD-10-CM

## 2022-12-27 ENCOUNTER — Other Ambulatory Visit: Payer: Self-pay | Admitting: Internal Medicine

## 2022-12-27 DIAGNOSIS — R928 Other abnormal and inconclusive findings on diagnostic imaging of breast: Secondary | ICD-10-CM

## 2023-01-05 ENCOUNTER — Ambulatory Visit
Admission: RE | Admit: 2023-01-05 | Discharge: 2023-01-05 | Disposition: A | Discharge status: Home / Self Care | Payer: Medicare HMO | Source: Ambulatory Visit | Attending: Internal Medicine | Admitting: Internal Medicine

## 2023-01-05 DIAGNOSIS — R928 Other abnormal and inconclusive findings on diagnostic imaging of breast: Secondary | ICD-10-CM

## 2023-01-05 DIAGNOSIS — R922 Inconclusive mammogram: Secondary | ICD-10-CM | POA: Diagnosis not present

## 2023-01-05 DIAGNOSIS — N641 Fat necrosis of breast: Secondary | ICD-10-CM | POA: Diagnosis not present

## 2023-01-06 ENCOUNTER — Other Ambulatory Visit: Payer: Self-pay | Admitting: Internal Medicine

## 2023-01-06 DIAGNOSIS — N641 Fat necrosis of breast: Secondary | ICD-10-CM

## 2023-02-06 DIAGNOSIS — I10 Essential (primary) hypertension: Secondary | ICD-10-CM | POA: Diagnosis not present

## 2023-02-06 DIAGNOSIS — E782 Mixed hyperlipidemia: Secondary | ICD-10-CM | POA: Diagnosis not present

## 2023-02-06 DIAGNOSIS — I2510 Atherosclerotic heart disease of native coronary artery without angina pectoris: Secondary | ICD-10-CM | POA: Diagnosis not present

## 2023-02-06 DIAGNOSIS — E039 Hypothyroidism, unspecified: Secondary | ICD-10-CM | POA: Diagnosis not present

## 2023-03-08 ENCOUNTER — Telehealth: Payer: Self-pay | Admitting: Internal Medicine

## 2023-03-08 NOTE — Telephone Encounter (Signed)
Contacted patient to scheduled appointments. Patient is aware of appointments that are scheduled.   

## 2023-03-17 DIAGNOSIS — R5383 Other fatigue: Secondary | ICD-10-CM | POA: Diagnosis not present

## 2023-03-17 DIAGNOSIS — J310 Chronic rhinitis: Secondary | ICD-10-CM | POA: Diagnosis not present

## 2023-03-17 DIAGNOSIS — J029 Acute pharyngitis, unspecified: Secondary | ICD-10-CM | POA: Diagnosis not present

## 2023-03-17 DIAGNOSIS — I10 Essential (primary) hypertension: Secondary | ICD-10-CM | POA: Diagnosis not present

## 2023-03-17 DIAGNOSIS — R0981 Nasal congestion: Secondary | ICD-10-CM | POA: Diagnosis not present

## 2023-03-17 DIAGNOSIS — D47Z2 Castleman disease: Secondary | ICD-10-CM | POA: Diagnosis not present

## 2023-03-17 DIAGNOSIS — J0100 Acute maxillary sinusitis, unspecified: Secondary | ICD-10-CM | POA: Diagnosis not present

## 2023-03-17 DIAGNOSIS — Z1152 Encounter for screening for COVID-19: Secondary | ICD-10-CM | POA: Diagnosis not present

## 2023-03-17 DIAGNOSIS — G43909 Migraine, unspecified, not intractable, without status migrainosus: Secondary | ICD-10-CM | POA: Diagnosis not present

## 2023-03-27 ENCOUNTER — Telehealth: Payer: Self-pay | Admitting: Internal Medicine

## 2023-03-27 NOTE — Telephone Encounter (Signed)
Rescheduled September appointments, patient is notified of new appointments.

## 2023-03-28 ENCOUNTER — Ambulatory Visit (HOSPITAL_COMMUNITY)
Admission: RE | Admit: 2023-03-28 | Discharge: 2023-03-28 | Disposition: A | Discharge status: Home / Self Care | Payer: Medicare HMO | Source: Ambulatory Visit | Attending: Internal Medicine | Admitting: Internal Medicine

## 2023-03-28 ENCOUNTER — Encounter (HOSPITAL_COMMUNITY): Payer: Self-pay

## 2023-03-28 ENCOUNTER — Inpatient Hospital Stay: Payer: Medicare HMO | Attending: Internal Medicine

## 2023-03-28 DIAGNOSIS — D47Z2 Castleman disease: Secondary | ICD-10-CM | POA: Insufficient documentation

## 2023-03-28 DIAGNOSIS — Z79899 Other long term (current) drug therapy: Secondary | ICD-10-CM | POA: Insufficient documentation

## 2023-03-28 DIAGNOSIS — R590 Localized enlarged lymph nodes: Secondary | ICD-10-CM | POA: Diagnosis not present

## 2023-03-28 DIAGNOSIS — K8020 Calculus of gallbladder without cholecystitis without obstruction: Secondary | ICD-10-CM | POA: Diagnosis not present

## 2023-03-28 DIAGNOSIS — D696 Thrombocytopenia, unspecified: Secondary | ICD-10-CM | POA: Insufficient documentation

## 2023-03-28 DIAGNOSIS — R591 Generalized enlarged lymph nodes: Secondary | ICD-10-CM | POA: Insufficient documentation

## 2023-03-28 DIAGNOSIS — K5730 Diverticulosis of large intestine without perforation or abscess without bleeding: Secondary | ICD-10-CM | POA: Diagnosis not present

## 2023-03-28 LAB — CBC WITH DIFFERENTIAL (CANCER CENTER ONLY)
Abs Immature Granulocytes: 0.04 10*3/uL (ref 0.00–0.07)
Basophils Absolute: 0.1 10*3/uL (ref 0.0–0.1)
Basophils Relative: 1 %
Eosinophils Absolute: 0.2 10*3/uL (ref 0.0–0.5)
Eosinophils Relative: 2 %
HCT: 43.6 % (ref 36.0–46.0)
Hemoglobin: 14.8 g/dL (ref 12.0–15.0)
Immature Granulocytes: 1 %
Lymphocytes Relative: 33 %
Lymphs Abs: 2.9 10*3/uL (ref 0.7–4.0)
MCH: 32 pg (ref 26.0–34.0)
MCHC: 33.9 g/dL (ref 30.0–36.0)
MCV: 94.2 fL (ref 80.0–100.0)
Monocytes Absolute: 0.6 10*3/uL (ref 0.1–1.0)
Monocytes Relative: 7 %
Neutro Abs: 5 10*3/uL (ref 1.7–7.7)
Neutrophils Relative %: 56 %
Platelet Count: 228 10*3/uL (ref 150–400)
RBC: 4.63 MIL/uL (ref 3.87–5.11)
RDW: 12.2 % (ref 11.5–15.5)
WBC Count: 8.8 10*3/uL (ref 4.0–10.5)
nRBC: 0 % (ref 0.0–0.2)

## 2023-03-28 LAB — CMP (CANCER CENTER ONLY)
ALT: 13 U/L (ref 0–44)
AST: 17 U/L (ref 15–41)
Albumin: 4.8 g/dL (ref 3.5–5.0)
Alkaline Phosphatase: 69 U/L (ref 38–126)
Anion gap: 7 (ref 5–15)
BUN: 15 mg/dL (ref 8–23)
CO2: 32 mmol/L (ref 22–32)
Calcium: 10.3 mg/dL (ref 8.9–10.3)
Chloride: 102 mmol/L (ref 98–111)
Creatinine: 0.81 mg/dL (ref 0.44–1.00)
GFR, Estimated: >60 mL/min (ref >60)
Glucose, Bld: 95 mg/dL (ref 70–99)
Potassium: 4.5 mmol/L (ref 3.5–5.1)
Sodium: 141 mmol/L (ref 135–145)
Total Bilirubin: 0.3 mg/dL (ref 0.3–1.2)
Total Protein: 7.3 g/dL (ref 6.5–8.1)

## 2023-03-28 LAB — LACTATE DEHYDROGENASE: LDH: 131 U/L (ref 98–192)

## 2023-03-28 MED ORDER — IOHEXOL 300 MG/ML  SOLN
100.0000 mL | Freq: Once | INTRAMUSCULAR | Status: AC | PRN
  Administered 2023-03-28: 100 mL via INTRAVENOUS

## 2023-03-30 ENCOUNTER — Ambulatory Visit: Payer: Medicare HMO | Admitting: Internal Medicine

## 2023-03-30 ENCOUNTER — Other Ambulatory Visit (HOSPITAL_COMMUNITY): Payer: Medicare HMO

## 2023-04-04 ENCOUNTER — Ambulatory Visit: Payer: Medicare HMO | Admitting: Internal Medicine

## 2023-04-06 ENCOUNTER — Inpatient Hospital Stay: Payer: Medicare HMO | Attending: Internal Medicine | Admitting: Internal Medicine

## 2023-04-06 ENCOUNTER — Other Ambulatory Visit: Payer: Self-pay

## 2023-04-06 VITALS — BP 125/67 | HR 65 | Temp 98.2°F | Resp 16 | Ht 63.0 in | Wt 183.1 lb

## 2023-04-06 DIAGNOSIS — D47Z2 Castleman disease: Secondary | ICD-10-CM | POA: Diagnosis not present

## 2023-04-06 NOTE — Progress Notes (Signed)
[facility] Telephone:(336) [phone]   Fax:(336) [phone]  OFFICE PROGRESS NOTE  [name], MD [address] [place] [address]  DIAGNOSIS:  1. Castleman's Disease  2. Idiopathic Thrombocytopenic Purpura. 3. 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  2) Weekly rituximab at 375 mg per meter squared with first cycle given in the hospital. Now status post 2 partial cycles and 7 complete cycles.  3) Maintenance Rituxan 375 mg/M2 every 3 months, status post 12 cycles.  CURRENT THERAPY: Observation.  INTERVAL HISTORY: [name] 67 y.o. female returns to the clinic today for annual follow-up visit.  The patient is feeling fine today with no concerning complaints except for mild fatigue and occasional shortness of breath with exertion but she continues to smoke at regular basis.  She denied having any current chest pain, cough or hemoptysis.  She has no nausea, vomiting, diarrhea or constipation.  She has no headache or visual changes.  She has no recent weight loss or night sweats.  She is here today for evaluation with repeat CT scan of the chest, abdomen and pelvis for restaging of her disease.  MEDICAL HISTORY: Past Medical History:  Diagnosis Date   Carpal tunnel syndrome    Castleman's disease ([facility])    Chronic kidney disease    failure   Heart attack ([facility]) 2007   Heart attack ([facility]) 2007   Hypertension    Hypothyroidism    ITP (idiopathic thrombocytopenic purpura) 06/17/2011   Pulmonary nodule 10/10/2016   Retroperitoneal lymphadenopathy 2012   Stroke [facility])     ALLERGIES:  is allergic to cefdinir, levofloxacin, and vicodin [hydrocodone-acetaminophen].  MEDICATIONS:  Current Outpatient Medications  Medication Sig Dispense Refill   ALPRAZolam (XANAX) 0.25 MG tablet Take 0.5 mg by mouth at bedtime as  needed for sleep. For sleep     ALPRAZolam (XANAX) 0.5 MG tablet Take 0.5 mg by mouth 2 (two) times daily.     clopidogrel (PLAVIX) 75 MG tablet Take 1 tablet (75 mg total) by mouth daily. 90 tablet 1   levothyroxine (SYNTHROID) 100 MCG tablet Take 100 mcg by mouth every morning.     metoprolol succinate (TOPROL-XL) 25 MG 24 hr tablet Take 12.5 mg by mouth daily.      nicotine (NICODERM CQ - DOSED IN MG/24 HOURS) 21 mg/24hr patch Place 14 mg onto the skin daily.      pantoprazole (PROTONIX) 20 MG tablet Take 20 mg by mouth daily.     pantoprazole (PROTONIX) 40 MG tablet Take 40 mg by mouth every morning.     rosuvastatin (CRESTOR) 40 MG tablet Take 1 tablet (40 mg total) by mouth daily. 90 tablet 1   No current facility-administered medications for this visit.   Facility-Administered Medications Ordered in Other Visits  Medication Dose Route Frequency Provider Last Rate Last Admin   sodium chloride 0.9 % injection 10 mL  10 mL Intracatheter PRN Si Gaul, MD        SURGICAL HISTORY:  Past Surgical History:  Procedure Laterality Date   APPENDECTOMY     67 years old   CESAREAN SECTION  1983 1989    REVIEW OF SYSTEMS:  A comprehensive review of systems was negative except for: Constitutional: positive for fatigue Respiratory: positive for dyspnea on exertion   PHYSICAL EXAMINATION: General appearance: alert, cooperative, fatigued, and no distress Head:  Normocephalic, without obvious abnormality, atraumatic Neck: no adenopathy Lymph nodes: Cervical, supraclavicular, and axillary nodes normal. Resp: clear to auscultation bilaterally Back: symmetric, no curvature. ROM normal. No CVA tenderness. Cardio: regular rate and rhythm, S1, S2 normal, no murmur, click, rub or gallop GI: soft, non-tender; bowel sounds normal; no masses,  no organomegaly Extremities: extremities normal, atraumatic, no cyanosis or edema  ECOG PERFORMANCE STATUS: 0 - Asymptomatic  Blood pressure 125/67,  pulse 65, temperature 98.2 F (36.8 C), temperature source Oral, resp. rate 16, height 5\' 3"  (1.6 m), weight 183 lb 1.6 oz (83.1 kg), SpO2 99%.  LABORATORY DATA: Lab Results  Component Value Date   WBC 8.8 03/28/2023   HGB 14.8 03/28/2023   HCT 43.6 03/28/2023   MCV 94.2 03/28/2023   PLT 228 03/28/2023      Chemistry      Component Value Date/Time   NA 141 03/28/2023 1128   NA 142 04/07/2017 1017   K 4.5 03/28/2023 1128   K 4.5 04/07/2017 1017   CL 102 03/28/2023 1128   CL 106 01/18/2013 0851   CO2 32 03/28/2023 1128   CO2 28 04/07/2017 1017   BUN 15 03/28/2023 1128   BUN 10.4 04/07/2017 1017   CREATININE 0.81 03/28/2023 1128   CREATININE 0.8 04/07/2017 1017      Component Value Date/Time   CALCIUM 10.3 03/28/2023 1128   CALCIUM 9.9 04/07/2017 1017   ALKPHOS 69 03/28/2023 1128   ALKPHOS 116 04/07/2017 1017   AST 17 03/28/2023 1128   AST 18 04/07/2017 1017   ALT 13 03/28/2023 1128   ALT 11 04/07/2017 1017   BILITOT 0.3 03/28/2023 1128   BILITOT 0.38 04/07/2017 1017       RADIOGRAPHIC STUDIES: CT Chest W Contrast  Result Date: 04/01/2023 CLINICAL DATA:  Follow-up calcium is disease EXAM: CT CHEST, ABDOMEN, AND PELVIS WITH CONTRAST TECHNIQUE: Multidetector CT imaging of the chest, abdomen and pelvis was performed following the standard protocol during bolus administration of intravenous contrast. RADIATION DOSE REDUCTION: This exam was performed according to the departmental dose-optimization program which includes automated exposure control, adjustment of the mA and/or kV according to patient size and/or use of iterative reconstruction technique. CONTRAST:  OMNIPAQUE IOHEXOL 300 MG/ML  SOLN COMPARISON:  03/28/2022 FINDINGS: CT CHEST FINDINGS Cardiovascular: The heart is normal in size. No pericardial effusion. No evidence of thoracic aortic aneurysm. Atherosclerotic calcifications of the aortic arch. Moderate coronary atherosclerosis of the LAD and right coronary  artery. Mediastinum/Nodes: No suspicious mediastinal lymphadenopathy. Small bilateral axillary and subpectoral lymphadenopathy, left greater than right,, unchanged. Dominant 9 mm short axis left subpectoral node (series 2/image 10), unchanged. Visualized thyroid unremarkable. Lungs/Pleura: Moderate centrilobular and paraseptal emphysematous changes, upper lung predominant. Mild biapical pleural-parenchymal scarring. No suspicious pulmonary nodules. No focal consolidation. No pleural effusion or pneumothorax. Musculoskeletal: Visualized osseous structures are within normal limits. CT ABDOMEN PELVIS FINDINGS Hepatobiliary: Liver is within normal limits. Layering gallstones (series 2/image 63), without associated inflammatory changes. No intrahepatic or extrahepatic ductal dilatation. Pancreas: Within normal limits. Spleen: Within normal limits. Adrenals/Urinary Tract: Adrenal glands are within normal limits. Kidneys are within normal limits.  No hydronephrosis. Bladder is within normal limits. Stomach/Bowel: Stomach is within normal limits. No evidence of bowel obstruction. Appendix is not discretely visualized. Scattered colonic diverticulosis, without evidence of diverticulitis. Vascular/Lymphatic: No evidence of abdominal aortic aneurysm. Atherosclerotic calcifications of the abdominal aorta and branch vessels, although vessels remain patent. Small retroperitoneal nodes, including a dominant 11 mm short axis left periaortic  node (series 2/image 72), unchanged. Reproductive: Uterus is within normal limits. No adnexal masses. Other: No abdominopelvic ascites. Musculoskeletal: Mild degenerative changes at L5-S1. IMPRESSION: Small bilateral axillary and retroperitoneal nodes, likely related to the patient's known Castleman's disease. Cholelithiasis, without associated inflammatory changes. Aortic Atherosclerosis (ICD10-I70.0) and Emphysema (ICD10-J43.9). Electronically Signed   By: Charline Bills M.D.   On:  04/01/2023 02:01   CT Abdomen Pelvis W Contrast  Result Date: 04/01/2023 CLINICAL DATA:  Follow-up calcium is disease EXAM: CT CHEST, ABDOMEN, AND PELVIS WITH CONTRAST TECHNIQUE: Multidetector CT imaging of the chest, abdomen and pelvis was performed following the standard protocol during bolus administration of intravenous contrast. RADIATION DOSE REDUCTION: This exam was performed according to the departmental dose-optimization program which includes automated exposure control, adjustment of the mA and/or kV according to patient size and/or use of iterative reconstruction technique. CONTRAST:  OMNIPAQUE IOHEXOL 300 MG/ML  SOLN COMPARISON:  03/28/2022 FINDINGS: CT CHEST FINDINGS Cardiovascular: The heart is normal in size. No pericardial effusion. No evidence of thoracic aortic aneurysm. Atherosclerotic calcifications of the aortic arch. Moderate coronary atherosclerosis of the LAD and right coronary artery. Mediastinum/Nodes: No suspicious mediastinal lymphadenopathy. Small bilateral axillary and subpectoral lymphadenopathy, left greater than right,, unchanged. Dominant 9 mm short axis left subpectoral node (series 2/image 10), unchanged. Visualized thyroid unremarkable. Lungs/Pleura: Moderate centrilobular and paraseptal emphysematous changes, upper lung predominant. Mild biapical pleural-parenchymal scarring. No suspicious pulmonary nodules. No focal consolidation. No pleural effusion or pneumothorax. Musculoskeletal: Visualized osseous structures are within normal limits. CT ABDOMEN PELVIS FINDINGS Hepatobiliary: Liver is within normal limits. Layering gallstones (series 2/image 63), without associated inflammatory changes. No intrahepatic or extrahepatic ductal dilatation. Pancreas: Within normal limits. Spleen: Within normal limits. Adrenals/Urinary Tract: Adrenal glands are within normal limits. Kidneys are within normal limits.  No hydronephrosis. Bladder is within normal limits. Stomach/Bowel:  Stomach is within normal limits. No evidence of bowel obstruction. Appendix is not discretely visualized. Scattered colonic diverticulosis, without evidence of diverticulitis. Vascular/Lymphatic: No evidence of abdominal aortic aneurysm. Atherosclerotic calcifications of the abdominal aorta and branch vessels, although vessels remain patent. Small retroperitoneal nodes, including a dominant 11 mm short axis left periaortic node (series 2/image 72), unchanged. Reproductive: Uterus is within normal limits. No adnexal masses. Other: No abdominopelvic ascites. Musculoskeletal: Mild degenerative changes at L5-S1. IMPRESSION: Small bilateral axillary and retroperitoneal nodes, likely related to the patient's known Castleman's disease. Cholelithiasis, without associated inflammatory changes. Aortic Atherosclerosis (ICD10-I70.0) and Emphysema (ICD10-J43.9). Electronically Signed   By: Charline Bills M.D.   On: 04/01/2023 02:01    ASSESSMENT AND PLAN:  This is a very pleasant 67 years old white female with Castleman disease status post induction treatment with Rituxan followed by maintenance Rituxan for 12 cycles. The patient is currently on observation and she is feeling fine with no concerning complaints. She had repeat CT scan of the chest, abdomen and pelvis that showed no concerning findings for disease progression and she continues to have the small bilateral axillary and retroperitoneal nodes. I recommended for her to continue on observation with repeat CT scan of the chest, abdomen and pelvis in 1 year. I strongly encouraged her to quit smoking. She was advised to call immediately if she has any other concerning symptoms in the interval. All questions were answered. The patient knows to call the clinic with any problems, questions or concerns. We can certainly see the patient much sooner if necessary.  Disclaimer: This note was dictated with voice recognition software. Similar sounding words can  inadvertently  be transcribed and may not be corrected upon review.

## 2023-04-10 ENCOUNTER — Ambulatory Visit
Admission: RE | Admit: 2023-04-10 | Discharge: 2023-04-10 | Disposition: A | Discharge status: Home / Self Care | Payer: Medicare HMO | Source: Ambulatory Visit | Attending: Internal Medicine | Admitting: Internal Medicine

## 2023-04-10 DIAGNOSIS — R928 Other abnormal and inconclusive findings on diagnostic imaging of breast: Secondary | ICD-10-CM | POA: Diagnosis not present

## 2023-04-10 DIAGNOSIS — N641 Fat necrosis of breast: Secondary | ICD-10-CM

## 2023-05-04 DIAGNOSIS — H2513 Age-related nuclear cataract, bilateral: Secondary | ICD-10-CM | POA: Diagnosis not present

## 2023-05-04 DIAGNOSIS — H5203 Hypermetropia, bilateral: Secondary | ICD-10-CM | POA: Diagnosis not present

## 2023-05-24 DIAGNOSIS — Z23 Encounter for immunization: Secondary | ICD-10-CM | POA: Diagnosis not present

## 2023-06-08 DIAGNOSIS — J069 Acute upper respiratory infection, unspecified: Secondary | ICD-10-CM | POA: Diagnosis not present

## 2023-06-08 DIAGNOSIS — Z1152 Encounter for screening for COVID-19: Secondary | ICD-10-CM | POA: Diagnosis not present

## 2023-06-08 DIAGNOSIS — R058 Other specified cough: Secondary | ICD-10-CM | POA: Diagnosis not present

## 2023-06-08 DIAGNOSIS — R638 Other symptoms and signs concerning food and fluid intake: Secondary | ICD-10-CM | POA: Diagnosis not present

## 2023-06-08 DIAGNOSIS — R5383 Other fatigue: Secondary | ICD-10-CM | POA: Diagnosis not present

## 2023-06-08 DIAGNOSIS — R0981 Nasal congestion: Secondary | ICD-10-CM | POA: Diagnosis not present

## 2023-06-08 DIAGNOSIS — J441 Chronic obstructive pulmonary disease with (acute) exacerbation: Secondary | ICD-10-CM | POA: Diagnosis not present

## 2023-06-12 DIAGNOSIS — J310 Chronic rhinitis: Secondary | ICD-10-CM | POA: Diagnosis not present

## 2023-06-12 DIAGNOSIS — J441 Chronic obstructive pulmonary disease with (acute) exacerbation: Secondary | ICD-10-CM | POA: Diagnosis not present

## 2023-06-12 DIAGNOSIS — F17219 Nicotine dependence, cigarettes, with unspecified nicotine-induced disorders: Secondary | ICD-10-CM | POA: Diagnosis not present

## 2023-06-12 DIAGNOSIS — F409 Phobic anxiety disorder, unspecified: Secondary | ICD-10-CM | POA: Diagnosis not present

## 2023-08-14 DIAGNOSIS — E039 Hypothyroidism, unspecified: Secondary | ICD-10-CM | POA: Diagnosis not present

## 2023-08-14 DIAGNOSIS — I2510 Atherosclerotic heart disease of native coronary artery without angina pectoris: Secondary | ICD-10-CM | POA: Diagnosis not present

## 2023-08-14 DIAGNOSIS — E782 Mixed hyperlipidemia: Secondary | ICD-10-CM | POA: Diagnosis not present

## 2023-08-14 DIAGNOSIS — I10 Essential (primary) hypertension: Secondary | ICD-10-CM | POA: Diagnosis not present

## 2023-08-21 DIAGNOSIS — E039 Hypothyroidism, unspecified: Secondary | ICD-10-CM | POA: Diagnosis not present

## 2023-09-13 DIAGNOSIS — E039 Hypothyroidism, unspecified: Secondary | ICD-10-CM | POA: Diagnosis not present

## 2023-09-13 DIAGNOSIS — I2510 Atherosclerotic heart disease of native coronary artery without angina pectoris: Secondary | ICD-10-CM | POA: Diagnosis not present

## 2023-09-13 DIAGNOSIS — I10 Essential (primary) hypertension: Secondary | ICD-10-CM | POA: Diagnosis not present

## 2023-09-13 DIAGNOSIS — E782 Mixed hyperlipidemia: Secondary | ICD-10-CM | POA: Diagnosis not present

## 2023-10-02 DIAGNOSIS — R7301 Impaired fasting glucose: Secondary | ICD-10-CM | POA: Diagnosis not present

## 2023-10-02 DIAGNOSIS — E785 Hyperlipidemia, unspecified: Secondary | ICD-10-CM | POA: Diagnosis not present

## 2023-10-02 DIAGNOSIS — E039 Hypothyroidism, unspecified: Secondary | ICD-10-CM | POA: Diagnosis not present

## 2023-10-02 DIAGNOSIS — Z1212 Encounter for screening for malignant neoplasm of rectum: Secondary | ICD-10-CM | POA: Diagnosis not present

## 2023-10-02 DIAGNOSIS — E559 Vitamin D deficiency, unspecified: Secondary | ICD-10-CM | POA: Diagnosis not present

## 2023-10-02 DIAGNOSIS — I2510 Atherosclerotic heart disease of native coronary artery without angina pectoris: Secondary | ICD-10-CM | POA: Diagnosis not present

## 2023-10-02 DIAGNOSIS — I10 Essential (primary) hypertension: Secondary | ICD-10-CM | POA: Diagnosis not present

## 2023-10-08 DIAGNOSIS — E039 Hypothyroidism, unspecified: Secondary | ICD-10-CM | POA: Diagnosis not present

## 2023-10-08 DIAGNOSIS — Z1212 Encounter for screening for malignant neoplasm of rectum: Secondary | ICD-10-CM | POA: Diagnosis not present

## 2023-10-09 DIAGNOSIS — Z0000 Encounter for general adult medical examination without abnormal findings: Secondary | ICD-10-CM | POA: Diagnosis not present

## 2023-10-09 DIAGNOSIS — E785 Hyperlipidemia, unspecified: Secondary | ICD-10-CM | POA: Diagnosis not present

## 2023-10-09 DIAGNOSIS — J432 Centrilobular emphysema: Secondary | ICD-10-CM | POA: Diagnosis not present

## 2023-10-09 DIAGNOSIS — F409 Phobic anxiety disorder, unspecified: Secondary | ICD-10-CM | POA: Diagnosis not present

## 2023-10-09 DIAGNOSIS — E039 Hypothyroidism, unspecified: Secondary | ICD-10-CM | POA: Diagnosis not present

## 2023-10-09 DIAGNOSIS — H6123 Impacted cerumen, bilateral: Secondary | ICD-10-CM | POA: Diagnosis not present

## 2023-10-09 DIAGNOSIS — Z1339 Encounter for screening examination for other mental health and behavioral disorders: Secondary | ICD-10-CM | POA: Diagnosis not present

## 2023-10-09 DIAGNOSIS — I10 Essential (primary) hypertension: Secondary | ICD-10-CM | POA: Diagnosis not present

## 2023-10-09 DIAGNOSIS — Z1331 Encounter for screening for depression: Secondary | ICD-10-CM | POA: Diagnosis not present

## 2023-10-09 DIAGNOSIS — R82998 Other abnormal findings in urine: Secondary | ICD-10-CM | POA: Diagnosis not present

## 2023-10-09 DIAGNOSIS — F17219 Nicotine dependence, cigarettes, with unspecified nicotine-induced disorders: Secondary | ICD-10-CM | POA: Diagnosis not present

## 2023-10-09 DIAGNOSIS — I739 Peripheral vascular disease, unspecified: Secondary | ICD-10-CM | POA: Diagnosis not present

## 2023-10-09 DIAGNOSIS — D47Z2 Castleman disease: Secondary | ICD-10-CM | POA: Diagnosis not present

## 2023-10-09 DIAGNOSIS — I2510 Atherosclerotic heart disease of native coronary artery without angina pectoris: Secondary | ICD-10-CM | POA: Diagnosis not present

## 2023-10-09 DIAGNOSIS — J343 Hypertrophy of nasal turbinates: Secondary | ICD-10-CM | POA: Diagnosis not present

## 2023-11-02 DIAGNOSIS — Z8673 Personal history of transient ischemic attack (TIA), and cerebral infarction without residual deficits: Secondary | ICD-10-CM | POA: Diagnosis not present

## 2023-11-02 DIAGNOSIS — J342 Deviated nasal septum: Secondary | ICD-10-CM | POA: Diagnosis not present

## 2023-11-07 DIAGNOSIS — E041 Nontoxic single thyroid nodule: Secondary | ICD-10-CM | POA: Diagnosis not present

## 2023-11-21 ENCOUNTER — Other Ambulatory Visit: Payer: Self-pay | Admitting: Internal Medicine

## 2023-11-21 DIAGNOSIS — Z1231 Encounter for screening mammogram for malignant neoplasm of breast: Secondary | ICD-10-CM

## 2023-11-28 DIAGNOSIS — F17219 Nicotine dependence, cigarettes, with unspecified nicotine-induced disorders: Secondary | ICD-10-CM | POA: Diagnosis not present

## 2023-11-28 DIAGNOSIS — Q4479 Other congenital malformations of liver: Secondary | ICD-10-CM | POA: Diagnosis not present

## 2023-11-28 DIAGNOSIS — E039 Hypothyroidism, unspecified: Secondary | ICD-10-CM | POA: Diagnosis not present

## 2023-11-28 DIAGNOSIS — R140 Abdominal distension (gaseous): Secondary | ICD-10-CM | POA: Diagnosis not present

## 2023-12-04 ENCOUNTER — Other Ambulatory Visit: Payer: Self-pay | Admitting: Family Medicine

## 2023-12-04 DIAGNOSIS — R140 Abdominal distension (gaseous): Secondary | ICD-10-CM

## 2023-12-11 ENCOUNTER — Ambulatory Visit
Admission: RE | Admit: 2023-12-11 | Discharge: 2023-12-11 | Disposition: A | Discharge status: Home / Self Care | Source: Ambulatory Visit | Attending: Family Medicine | Admitting: Family Medicine

## 2023-12-11 DIAGNOSIS — K5900 Constipation, unspecified: Secondary | ICD-10-CM | POA: Diagnosis not present

## 2023-12-11 DIAGNOSIS — K5730 Diverticulosis of large intestine without perforation or abscess without bleeding: Secondary | ICD-10-CM | POA: Diagnosis not present

## 2023-12-11 DIAGNOSIS — R140 Abdominal distension (gaseous): Secondary | ICD-10-CM

## 2024-01-01 ENCOUNTER — Ambulatory Visit

## 2024-01-08 ENCOUNTER — Ambulatory Visit
Admission: RE | Admit: 2024-01-08 | Discharge: 2024-01-08 | Disposition: A | Discharge status: Home / Self Care | Source: Ambulatory Visit | Attending: Internal Medicine | Admitting: Internal Medicine

## 2024-01-08 DIAGNOSIS — Z1231 Encounter for screening mammogram for malignant neoplasm of breast: Secondary | ICD-10-CM | POA: Diagnosis not present

## 2024-01-15 DIAGNOSIS — I10 Essential (primary) hypertension: Secondary | ICD-10-CM | POA: Diagnosis not present

## 2024-01-31 DIAGNOSIS — E782 Mixed hyperlipidemia: Secondary | ICD-10-CM | POA: Diagnosis not present

## 2024-01-31 DIAGNOSIS — I10 Essential (primary) hypertension: Secondary | ICD-10-CM | POA: Diagnosis not present

## 2024-01-31 DIAGNOSIS — I2510 Atherosclerotic heart disease of native coronary artery without angina pectoris: Secondary | ICD-10-CM | POA: Diagnosis not present

## 2024-01-31 DIAGNOSIS — E039 Hypothyroidism, unspecified: Secondary | ICD-10-CM | POA: Diagnosis not present

## 2024-02-19 DIAGNOSIS — Z860100 Personal history of colon polyps, unspecified: Secondary | ICD-10-CM | POA: Diagnosis not present

## 2024-02-19 DIAGNOSIS — K5730 Diverticulosis of large intestine without perforation or abscess without bleeding: Secondary | ICD-10-CM | POA: Diagnosis not present

## 2024-02-19 DIAGNOSIS — K5904 Chronic idiopathic constipation: Secondary | ICD-10-CM | POA: Diagnosis not present

## 2024-03-20 DIAGNOSIS — Z860100 Personal history of colon polyps, unspecified: Secondary | ICD-10-CM | POA: Diagnosis not present

## 2024-03-20 DIAGNOSIS — K5904 Chronic idiopathic constipation: Secondary | ICD-10-CM | POA: Diagnosis not present

## 2024-03-27 ENCOUNTER — Other Ambulatory Visit: Payer: Self-pay | Admitting: Internal Medicine

## 2024-03-27 ENCOUNTER — Ambulatory Visit (HOSPITAL_COMMUNITY)
Admission: RE | Admit: 2024-03-27 | Discharge: 2024-03-27 | Disposition: A | Discharge status: Home / Self Care | Payer: Medicare HMO | Source: Ambulatory Visit | Attending: Internal Medicine | Admitting: Internal Medicine

## 2024-03-27 ENCOUNTER — Inpatient Hospital Stay: Attending: Internal Medicine

## 2024-03-27 ENCOUNTER — Other Ambulatory Visit: Payer: Medicare HMO

## 2024-03-27 DIAGNOSIS — J439 Emphysema, unspecified: Secondary | ICD-10-CM | POA: Diagnosis not present

## 2024-03-27 DIAGNOSIS — K5730 Diverticulosis of large intestine without perforation or abscess without bleeding: Secondary | ICD-10-CM | POA: Diagnosis not present

## 2024-03-27 DIAGNOSIS — R911 Solitary pulmonary nodule: Secondary | ICD-10-CM | POA: Insufficient documentation

## 2024-03-27 DIAGNOSIS — D47Z2 Castleman disease: Secondary | ICD-10-CM | POA: Insufficient documentation

## 2024-03-27 DIAGNOSIS — I700 Atherosclerosis of aorta: Secondary | ICD-10-CM | POA: Insufficient documentation

## 2024-03-27 DIAGNOSIS — K8020 Calculus of gallbladder without cholecystitis without obstruction: Secondary | ICD-10-CM | POA: Insufficient documentation

## 2024-03-27 LAB — CBC WITH DIFFERENTIAL (CANCER CENTER ONLY)
Abs Immature Granulocytes: 0.02 K/uL (ref 0.00–0.07)
Basophils Absolute: 0.1 K/uL (ref 0.0–0.1)
Basophils Relative: 1 %
Eosinophils Absolute: 0.1 K/uL (ref 0.0–0.5)
Eosinophils Relative: 2 %
HCT: 44 % (ref 36.0–46.0)
Hemoglobin: 14.3 g/dL (ref 12.0–15.0)
Immature Granulocytes: 0 %
Lymphocytes Relative: 26 %
Lymphs Abs: 2 K/uL (ref 0.7–4.0)
MCH: 30 pg (ref 26.0–34.0)
MCHC: 32.5 g/dL (ref 30.0–36.0)
MCV: 92.2 fL (ref 80.0–100.0)
Monocytes Absolute: 0.5 K/uL (ref 0.1–1.0)
Monocytes Relative: 7 %
Neutro Abs: 4.8 K/uL (ref 1.7–7.7)
Neutrophils Relative %: 64 %
Platelet Count: 231 K/uL (ref 150–400)
RBC: 4.77 MIL/uL (ref 3.87–5.11)
RDW: 12 % (ref 11.5–15.5)
WBC Count: 7.5 K/uL (ref 4.0–10.5)
nRBC: 0 % (ref 0.0–0.2)

## 2024-03-27 LAB — CMP (CANCER CENTER ONLY)
ALT: 15 U/L (ref 0–44)
AST: 16 U/L (ref 15–41)
Albumin: 4.4 g/dL (ref 3.5–5.0)
Alkaline Phosphatase: 60 U/L (ref 38–126)
Anion gap: 3 — ABNORMAL LOW (ref 5–15)
BUN: 14 mg/dL (ref 8–23)
CO2: 34 mmol/L — ABNORMAL HIGH (ref 22–32)
Calcium: 9.7 mg/dL (ref 8.9–10.3)
Chloride: 104 mmol/L (ref 98–111)
Creatinine: 0.65 mg/dL (ref 0.44–1.00)
GFR, Estimated: >60 mL/min (ref >60)
Glucose, Bld: 93 mg/dL (ref 70–99)
Potassium: 4.6 mmol/L (ref 3.5–5.1)
Sodium: 141 mmol/L (ref 135–145)
Total Bilirubin: 0.4 mg/dL (ref 0.0–1.2)
Total Protein: 6.8 g/dL (ref 6.5–8.1)

## 2024-03-27 LAB — LACTATE DEHYDROGENASE: LDH: 110 U/L (ref 98–192)

## 2024-03-27 MED ORDER — IOHEXOL 9 MG/ML PO SOLN
ORAL | Status: AC
  Filled 2024-03-27: qty 1000

## 2024-03-27 MED ORDER — IOHEXOL 9 MG/ML PO SOLN
1000.0000 mL | ORAL | Status: AC
  Administered 2024-03-27: 1000 mL via ORAL

## 2024-03-27 MED ORDER — IOHEXOL 300 MG/ML  SOLN
100.0000 mL | Freq: Once | INTRAMUSCULAR | Status: AC | PRN
  Administered 2024-03-27: 100 mL via INTRAVENOUS

## 2024-04-03 ENCOUNTER — Ambulatory Visit: Payer: Medicare HMO | Admitting: Internal Medicine

## 2024-04-04 ENCOUNTER — Inpatient Hospital Stay: Attending: Internal Medicine | Admitting: Internal Medicine

## 2024-04-04 VITALS — BP 149/77 | HR 74 | Temp 97.5°F | Resp 17 | Ht 63.0 in | Wt 175.0 lb

## 2024-04-04 DIAGNOSIS — R911 Solitary pulmonary nodule: Secondary | ICD-10-CM | POA: Insufficient documentation

## 2024-04-04 DIAGNOSIS — Z79899 Other long term (current) drug therapy: Secondary | ICD-10-CM | POA: Insufficient documentation

## 2024-04-04 DIAGNOSIS — D693 Immune thrombocytopenic purpura: Secondary | ICD-10-CM | POA: Insufficient documentation

## 2024-04-04 DIAGNOSIS — D47Z2 Castleman disease: Secondary | ICD-10-CM | POA: Diagnosis not present

## 2024-04-04 NOTE — Progress Notes (Signed)
 [facility] [phone]   [phone]  OFFICE PROGRESS NOTE  Erin Toribio MATSU, MD [address] [place] [address]  DIAGNOSIS:  1. Castleman's Disease  2. Idiopathic Thrombocytopenic Purpura. 3. Lymphadenopathy consistent with angiofollicular lymphoid hyperplasia   PRIOR THERAPY:  1) Status post treatment with Rituxan  at 375 mg per meter squared last dose given 01/14/2005.  2) Weekly rituximab  at 375 mg per meter squared with first cycle given in the hospital. Now status post 2 partial cycles and 7 complete cycles.  3) Maintenance Rituxan  375 mg/M2 every 3 months, status post 12 cycles.  CURRENT THERAPY: Observation.  INTERVAL HISTORY: [name] 68 y.o. female returns to the clinic today for follow-up visit.Discussed the use of AI scribe software for clinical note transcription with the patient, who gave verbal consent to proceed.  History of Present Illness [name] is a 68 year old female with Castleman's disease and idiopathic thrombocytopenic purpura who presents for evaluation with repeat CT scan for restaging of her disease.  She has a history of Castleman's disease and idiopathic thrombocytopenic purpura, previously treated with Rituxan  infusion, and is currently under observation. She is here for a repeat CT scan of the chest, abdomen, and pelvis to restage her disease. She feels anxious about the scan results.  She is aware of a new four millimeter pulmonary nodule in the medial left upper lobe, which she read about in her scan report and is concerned about. She has a history of smoking and is currently using a nicotine patch. She experiences tenderness in her chest when in the shower, but this is not a daily occurrence.  She has emphysema, which affects her breathing, especially during exertion, such as attending her grandchildren's graduations. She does not currently have a pulmonologist. She recalls being given  medication for a cold last year.  She has a history of diverticulitis, diagnosed via CT scan, and experiences bloating, which she wonders if it affects her breathing.    MEDICAL HISTORY: Past Medical History:  Diagnosis Date   Carpal tunnel syndrome    Castleman's disease (HCC)    Chronic kidney disease    failure   Heart attack (HCC) 2007   Heart attack (HCC) 2007   Hypertension    Hypothyroidism    ITP (idiopathic thrombocytopenic purpura) 06/17/2011   Pulmonary nodule 10/10/2016   Retroperitoneal lymphadenopathy 2012   Stroke [facility])     ALLERGIES:  is allergic to cefdinir, levofloxacin, and vicodin [hydrocodone-acetaminophen ].  MEDICATIONS:  Current Outpatient Medications  Medication Sig Dispense Refill   ALPRAZolam  (XANAX ) 0.25 MG tablet Take 0.5 mg by mouth at bedtime as needed for sleep. For sleep     ALPRAZolam  (XANAX ) 0.5 MG tablet Take 0.5 mg by mouth 2 (two) times daily.     clopidogrel  (PLAVIX ) 75 MG tablet Take 1 tablet (75 mg total) by mouth daily. 90 tablet 1   levothyroxine  (SYNTHROID ) 100 MCG tablet Take 100 mcg by mouth every morning.     metoprolol  succinate (TOPROL -XL) 25 MG 24 hr tablet Take 12.5 mg by mouth daily.      nicotine (NICODERM CQ - DOSED IN MG/24 HOURS) 21 mg/24hr patch Place 14 mg onto the skin daily.      pantoprazole  (PROTONIX ) 20 MG tablet Take 20 mg by mouth daily.     pantoprazole  (PROTONIX ) 40 MG tablet Take 40 mg by mouth every morning.     rosuvastatin  (CRESTOR ) 40 MG tablet Take 1 tablet (40  mg total) by mouth daily. 90 tablet 1   No current facility-administered medications for this visit.   Facility-Administered Medications Ordered in Other Visits  Medication Dose Route Frequency Provider Last Rate Last Admin   sodium chloride  0.9 % injection 10 mL  10 mL Intracatheter PRN Erin Sherrod, MD        SURGICAL HISTORY:  Past Surgical History:  Procedure Laterality Date   APPENDECTOMY     68 years old   CESAREAN SECTION  1983  1989    REVIEW OF SYSTEMS:  Constitutional: positive for fatigue Eyes: negative Ears, nose, mouth, throat, and face: negative Respiratory: positive for cough Cardiovascular: negative Gastrointestinal: negative Genitourinary:negative Integument/breast: negative Hematologic/lymphatic: negative Musculoskeletal:negative Neurological: negative Behavioral/Psych: negative Endocrine: negative Allergic/Immunologic: negative   PHYSICAL EXAMINATION: General appearance: alert, cooperative, fatigued, and no distress Head: Normocephalic, without obvious abnormality, atraumatic Neck: no adenopathy Lymph nodes: Cervical, supraclavicular, and axillary nodes normal. Resp: clear to auscultation bilaterally Back: symmetric, no curvature. ROM normal. No CVA tenderness. Cardio: regular rate and rhythm, S1, S2 normal, no murmur, click, rub or gallop GI: soft, non-tender; bowel sounds normal; no masses,  no organomegaly Extremities: extremities normal, atraumatic, no cyanosis or edema Neurologic: Alert and oriented X 3, normal strength and tone. Normal symmetric reflexes. Normal coordination and gait  ECOG PERFORMANCE STATUS: 0 - Asymptomatic  Blood pressure (!) 149/77, pulse 74, temperature (!) 97.5 F (36.4 C), temperature source Temporal, resp. rate 17, height 5' 3 (1.6 m), weight 175 lb (79.4 kg), SpO2 97%.  LABORATORY DATA: Lab Results  Component Value Date   WBC 7.5 03/27/2024   HGB 14.3 03/27/2024   HCT 44.0 03/27/2024   MCV 92.2 03/27/2024   PLT 231 03/27/2024      Chemistry      Component Value Date/Time   NA 141 03/27/2024 1128   NA 142 04/07/2017 1017   K 4.6 03/27/2024 1128   K 4.5 04/07/2017 1017   CL 104 03/27/2024 1128   CL 106 01/18/2013 0851   CO2 34 (H) 03/27/2024 1128   CO2 28 04/07/2017 1017   BUN 14 03/27/2024 1128   BUN 10.4 04/07/2017 1017   CREATININE 0.65 03/27/2024 1128   CREATININE 0.8 04/07/2017 1017      Component Value Date/Time   CALCIUM  9.7  03/27/2024 1128   CALCIUM  9.9 04/07/2017 1017   ALKPHOS 60 03/27/2024 1128   ALKPHOS 116 04/07/2017 1017   AST 16 03/27/2024 1128   AST 18 04/07/2017 1017   ALT 15 03/27/2024 1128   ALT 11 04/07/2017 1017   BILITOT 0.4 03/27/2024 1128   BILITOT 0.38 04/07/2017 1017       RADIOGRAPHIC STUDIES: CT CHEST ABDOMEN PELVIS W CONTRAST Result Date: 04/03/2024 CLINICAL DATA:  Hematologic malignancy, staging. Castleman's disease. * Tracking Code: BO * EXAM: CT CHEST, ABDOMEN, AND PELVIS WITH CONTRAST TECHNIQUE: Multidetector CT imaging of the chest, abdomen and pelvis was performed following the standard protocol during bolus administration of intravenous contrast. RADIATION DOSE REDUCTION: This exam was performed according to the departmental dose-optimization program which includes automated exposure control, adjustment of the mA and/or kV according to patient size and/or use of iterative reconstruction technique. CONTRAST:  OMNIPAQUE  IOHEXOL  300 MG/ML  SOLN COMPARISON:  Multiple priors including CT March 28, 2023 and Dec 11, 2023 FINDINGS: CT CHEST FINDINGS Cardiovascular: Aortic atherosclerosis. Normal size heart. No significant pericardial effusion/thickening. Mediastinum/Nodes: No suspicious thyroid  nodule. Slight interval decrease in size of the bilateral axillary and subpectoral lymph nodes. For reference: -  left subpectoral lymph node measures 6 mm in short axis on image 8/2 previously 9 mm. Lungs/Pleura: New 4 mm pulmonary nodule in the medial left upper lobe on image 30/6. Apical predominant emphysema. Scattered atelectasis/scarring. Biapical pleuroparenchymal scarring. Musculoskeletal: No aggressive lytic or blastic lesion of bone. Mild degenerative change of the left glenohumeral joint. Mild thoracic spondylosis. CT ABDOMEN PELVIS FINDINGS Hepatobiliary: No suspicious hepatic lesion. Cholelithiasis without findings of acute cholecystitis. No biliary ductal dilation. Pancreas: No pancreatic  ductal dilation or evidence of acute inflammation. Spleen: No splenomegaly. Adrenals/Urinary Tract: No suspicious adrenal nodule/mass. No hydronephrosis. Subcentimeter hypodense left lower pole renal lesion is technically too small to accurately characterize but statistically likely a cyst. Urinary bladder is unremarkable for degree of distension. Stomach/Bowel: Stomach is minimally distended limiting evaluation. No pathologic dilation of small or large bowel. Colonic stool burden compatible with constipation. Left-sided colonic diverticulosis. Vascular/Lymphatic: Aortic atherosclerosis. Smooth IVC contours. The portal, splenic and superior mesenteric veins are patent. Stable prominent retroperitoneal lymph nodes for instance the previously indexed left periaortic lymph node near the level of the renal veins measures 10 mm in short axis on image 72/2 previously 11 mm. Reproductive: Uterus and bilateral adnexa are unremarkable. Other: No significant abdominopelvic free fluid. Musculoskeletal: No aggressive lytic or blastic lesion of bone. Thoracolumbar spondylosis. IMPRESSION: 1. Slight interval decrease in size of the bilateral axillary and subpectoral lymph nodes. 2. Stable prominent retroperitoneal lymph nodes. 3. New 4 mm pulmonary nodule in the medial left upper lobe, nonspecific but favored to reflect an infectious or inflammatory etiology. Consider attention on a dedicated follow-up chest CT. 4. Cholelithiasis without findings of acute cholecystitis. 5. Colonic diverticulosis without findings of acute diverticulitis. 6. Aortic Atherosclerosis (ICD10-I70.0) and Emphysema (ICD10-J43.9). Electronically Signed   By: Reyes Holder M.D.   On: 04/03/2024 14:15    ASSESSMENT AND PLAN:  This is a very pleasant 68 years old white female with Castleman disease status post induction treatment with Rituxan  followed by maintenance Rituxan  for 12 cycles. She had repeat CT scan of the chest, abdomen and pelvis performed  recently.  I personally independently reviewed the scan and discussed the results with the patient today.  Her scan showed no concerning findings for disease progression but there was new 0.4 cm pulmonary nodule in the medial left upper lobe nonspecific and likely infectious or inflammatory in origin. Assessment and Plan Assessment & Plan Castleman's disease Currently under observation with no active treatment.  Idiopathic thrombocytopenic purpura Previously treated with Rituxan  infusion. Currently under observation with no active treatment required.  Pulmonary nodule, left upper lobe, likely inflammatory New 4 mm pulmonary nodule in the medial left upper lobe, likely inflammatory or infectious in nature. Smoking may contribute to lung changes. No immediate intervention required as it is expected to resolve spontaneously. She expressed concern about the nodule, but reassured that it is likely benign. - Schedule follow-up chest CT in 6 months to monitor the pulmonary nodule. She was advised to call immediately if she has any other concerning symptoms in the interval All questions were answered. The patient knows to call the clinic with any problems, questions or concerns. We can certainly see the patient much sooner if necessary. The total time spent in the appointment was 30 minutes including review of chart and various tests results, discussions about plan of care and coordination of care plan .  Disclaimer: This note was dictated with voice recognition software. Similar sounding words can inadvertently be transcribed and may not be corrected upon review.

## 2024-05-09 DIAGNOSIS — E039 Hypothyroidism, unspecified: Secondary | ICD-10-CM | POA: Diagnosis not present

## 2024-05-21 DIAGNOSIS — R7301 Impaired fasting glucose: Secondary | ICD-10-CM | POA: Diagnosis not present

## 2024-05-21 DIAGNOSIS — Z23 Encounter for immunization: Secondary | ICD-10-CM | POA: Diagnosis not present

## 2024-05-21 DIAGNOSIS — E039 Hypothyroidism, unspecified: Secondary | ICD-10-CM | POA: Diagnosis not present

## 2024-05-21 DIAGNOSIS — I10 Essential (primary) hypertension: Secondary | ICD-10-CM | POA: Diagnosis not present

## 2024-05-21 DIAGNOSIS — K8020 Calculus of gallbladder without cholecystitis without obstruction: Secondary | ICD-10-CM | POA: Diagnosis not present

## 2024-05-21 DIAGNOSIS — J432 Centrilobular emphysema: Secondary | ICD-10-CM | POA: Diagnosis not present

## 2024-07-23 NOTE — Progress Notes (Addendum)
 [name]                                          MRN: [id number]   08/12/2024   The [facility] Quality Team Specialist reviewed this patient medical record for the purposes of chart review for care gap closure. The following were reviewed: chart review for care gap closure-controlling blood pressure.    [facility] Quality Team

## 2024-09-23 ENCOUNTER — Other Ambulatory Visit

## 2024-10-03 ENCOUNTER — Inpatient Hospital Stay: Admitting: Internal Medicine
